# Patient Record
Sex: Male | Born: 1970 | Race: Black or African American | Hispanic: No | Marital: Married | Smoking: Former smoker
Health system: Southern US, Community
[De-identification: ages and names within clinical notes are randomized; demographics above are authoritative.]

## PROBLEM LIST (undated history)

## (undated) DIAGNOSIS — K219 Gastro-esophageal reflux disease without esophagitis: Secondary | ICD-10-CM

## (undated) DIAGNOSIS — E669 Obesity, unspecified: Secondary | ICD-10-CM

## (undated) DIAGNOSIS — N179 Acute kidney failure, unspecified: Secondary | ICD-10-CM

## (undated) DIAGNOSIS — I10 Essential (primary) hypertension: Secondary | ICD-10-CM

## (undated) DIAGNOSIS — E785 Hyperlipidemia, unspecified: Secondary | ICD-10-CM

## (undated) DIAGNOSIS — G43909 Migraine, unspecified, not intractable, without status migrainosus: Secondary | ICD-10-CM

## (undated) DIAGNOSIS — K2981 Duodenitis with bleeding: Secondary | ICD-10-CM

## (undated) DIAGNOSIS — M549 Dorsalgia, unspecified: Secondary | ICD-10-CM

## (undated) DIAGNOSIS — G8929 Other chronic pain: Secondary | ICD-10-CM

## (undated) DIAGNOSIS — R51 Headache: Secondary | ICD-10-CM

## (undated) DIAGNOSIS — G4730 Sleep apnea, unspecified: Secondary | ICD-10-CM

## (undated) HISTORY — DX: Headache: R51

## (undated) HISTORY — DX: Obesity, unspecified: E66.9

## (undated) HISTORY — PX: FOOT FRACTURE SURGERY: SHX645

## (undated) HISTORY — DX: Hyperlipidemia, unspecified: E78.5

## (undated) HISTORY — DX: Other chronic pain: G89.29

## (undated) HISTORY — DX: Duodenitis with bleeding: K29.81

## (undated) HISTORY — DX: Gastro-esophageal reflux disease without esophagitis: K21.9

## (undated) HISTORY — DX: Dorsalgia, unspecified: M54.9

## (undated) HISTORY — DX: Acute kidney failure, unspecified: N17.9

## (undated) HISTORY — DX: Essential (primary) hypertension: I10

---

## 1998-05-06 ENCOUNTER — Encounter: Admission: RE | Admit: 1998-05-06 | Discharge: 1998-05-06 | Payer: Self-pay | Admitting: *Deleted

## 2001-01-06 ENCOUNTER — Encounter: Payer: Self-pay | Admitting: Emergency Medicine

## 2001-01-06 ENCOUNTER — Emergency Department (HOSPITAL_COMMUNITY): Admission: EM | Admit: 2001-01-06 | Discharge: 2001-01-06 | Payer: Self-pay | Admitting: Emergency Medicine

## 2001-03-23 ENCOUNTER — Encounter: Payer: Self-pay | Admitting: Emergency Medicine

## 2001-03-23 ENCOUNTER — Emergency Department (HOSPITAL_COMMUNITY): Admission: EM | Admit: 2001-03-23 | Discharge: 2001-03-23 | Payer: Self-pay | Admitting: Emergency Medicine

## 2001-11-22 ENCOUNTER — Emergency Department (HOSPITAL_COMMUNITY): Admission: EM | Admit: 2001-11-22 | Discharge: 2001-11-22 | Payer: Self-pay | Admitting: *Deleted

## 2001-11-22 ENCOUNTER — Encounter: Payer: Self-pay | Admitting: *Deleted

## 2001-12-07 ENCOUNTER — Emergency Department (HOSPITAL_COMMUNITY): Admission: EM | Admit: 2001-12-07 | Discharge: 2001-12-07 | Payer: Self-pay | Admitting: *Deleted

## 2002-06-15 ENCOUNTER — Emergency Department (HOSPITAL_COMMUNITY): Admission: EM | Admit: 2002-06-15 | Discharge: 2002-06-15 | Payer: Self-pay | Admitting: Emergency Medicine

## 2002-09-19 ENCOUNTER — Emergency Department (HOSPITAL_COMMUNITY): Admission: EM | Admit: 2002-09-19 | Discharge: 2002-09-20 | Payer: Self-pay | Admitting: Emergency Medicine

## 2002-09-19 ENCOUNTER — Encounter: Payer: Self-pay | Admitting: Emergency Medicine

## 2002-12-25 ENCOUNTER — Emergency Department (HOSPITAL_COMMUNITY): Admission: EM | Admit: 2002-12-25 | Discharge: 2002-12-25 | Payer: Self-pay | Admitting: Emergency Medicine

## 2003-01-14 ENCOUNTER — Emergency Department (HOSPITAL_COMMUNITY): Admission: EM | Admit: 2003-01-14 | Discharge: 2003-01-14 | Payer: Self-pay | Admitting: Emergency Medicine

## 2003-04-06 ENCOUNTER — Emergency Department (HOSPITAL_COMMUNITY): Admission: EM | Admit: 2003-04-06 | Discharge: 2003-04-06 | Payer: Self-pay | Admitting: Emergency Medicine

## 2004-02-28 ENCOUNTER — Emergency Department (HOSPITAL_COMMUNITY): Admission: EM | Admit: 2004-02-28 | Discharge: 2004-02-29 | Payer: Self-pay | Admitting: Emergency Medicine

## 2004-03-04 ENCOUNTER — Inpatient Hospital Stay (HOSPITAL_COMMUNITY): Admission: EM | Admit: 2004-03-04 | Discharge: 2004-03-10 | Payer: Self-pay | Admitting: Emergency Medicine

## 2004-03-25 ENCOUNTER — Ambulatory Visit (HOSPITAL_COMMUNITY): Admission: RE | Admit: 2004-03-25 | Discharge: 2004-03-25 | Payer: Self-pay | Admitting: Family Medicine

## 2004-08-30 ENCOUNTER — Emergency Department (HOSPITAL_COMMUNITY): Admission: EM | Admit: 2004-08-30 | Discharge: 2004-08-30 | Payer: Self-pay | Admitting: Emergency Medicine

## 2004-11-11 ENCOUNTER — Inpatient Hospital Stay (HOSPITAL_COMMUNITY): Admission: RE | Admit: 2004-11-11 | Discharge: 2004-11-12 | Payer: Self-pay | Admitting: Orthopedic Surgery

## 2005-01-25 ENCOUNTER — Ambulatory Visit: Payer: Self-pay | Admitting: Family Medicine

## 2005-06-02 ENCOUNTER — Ambulatory Visit: Payer: Self-pay | Admitting: Family Medicine

## 2005-06-20 ENCOUNTER — Ambulatory Visit: Payer: Self-pay | Admitting: Family Medicine

## 2005-07-25 ENCOUNTER — Ambulatory Visit: Payer: Self-pay | Admitting: Family Medicine

## 2005-08-19 ENCOUNTER — Ambulatory Visit: Payer: Self-pay | Admitting: Family Medicine

## 2005-11-07 ENCOUNTER — Ambulatory Visit: Payer: Self-pay | Admitting: Family Medicine

## 2005-12-29 ENCOUNTER — Ambulatory Visit: Payer: Self-pay | Admitting: Family Medicine

## 2006-02-15 ENCOUNTER — Ambulatory Visit: Payer: Self-pay | Admitting: Family Medicine

## 2006-03-06 ENCOUNTER — Ambulatory Visit: Payer: Self-pay | Admitting: Internal Medicine

## 2006-06-14 ENCOUNTER — Ambulatory Visit: Payer: Self-pay | Admitting: Family Medicine

## 2006-09-19 ENCOUNTER — Emergency Department (HOSPITAL_COMMUNITY): Admission: EM | Admit: 2006-09-19 | Discharge: 2006-09-19 | Payer: Self-pay | Admitting: Emergency Medicine

## 2006-12-29 ENCOUNTER — Emergency Department (HOSPITAL_COMMUNITY): Admission: EM | Admit: 2006-12-29 | Discharge: 2006-12-29 | Payer: Self-pay | Admitting: Emergency Medicine

## 2007-01-09 ENCOUNTER — Ambulatory Visit: Payer: Self-pay | Admitting: Family Medicine

## 2007-03-28 ENCOUNTER — Emergency Department (HOSPITAL_COMMUNITY): Admission: EM | Admit: 2007-03-28 | Discharge: 2007-03-28 | Payer: Self-pay | Admitting: Emergency Medicine

## 2007-05-13 ENCOUNTER — Emergency Department (HOSPITAL_COMMUNITY): Admission: EM | Admit: 2007-05-13 | Discharge: 2007-05-13 | Payer: Self-pay | Admitting: Emergency Medicine

## 2007-05-26 ENCOUNTER — Emergency Department (HOSPITAL_COMMUNITY): Admission: EM | Admit: 2007-05-26 | Discharge: 2007-05-26 | Payer: Self-pay | Admitting: Emergency Medicine

## 2007-06-03 ENCOUNTER — Emergency Department (HOSPITAL_COMMUNITY): Admission: EM | Admit: 2007-06-03 | Discharge: 2007-06-03 | Payer: Self-pay | Admitting: Emergency Medicine

## 2007-06-12 ENCOUNTER — Ambulatory Visit: Payer: Self-pay | Admitting: Family Medicine

## 2007-07-10 ENCOUNTER — Emergency Department (HOSPITAL_COMMUNITY): Admission: EM | Admit: 2007-07-10 | Discharge: 2007-07-10 | Payer: Self-pay | Admitting: Emergency Medicine

## 2007-07-13 ENCOUNTER — Emergency Department (HOSPITAL_COMMUNITY): Admission: EM | Admit: 2007-07-13 | Discharge: 2007-07-14 | Payer: Self-pay | Admitting: Emergency Medicine

## 2007-07-17 ENCOUNTER — Ambulatory Visit: Payer: Self-pay | Admitting: Family Medicine

## 2007-07-28 ENCOUNTER — Emergency Department (HOSPITAL_COMMUNITY): Admission: EM | Admit: 2007-07-28 | Discharge: 2007-07-28 | Payer: Self-pay | Admitting: Emergency Medicine

## 2007-08-07 ENCOUNTER — Emergency Department (HOSPITAL_COMMUNITY): Admission: EM | Admit: 2007-08-07 | Discharge: 2007-08-07 | Payer: Self-pay | Admitting: Emergency Medicine

## 2007-08-28 ENCOUNTER — Ambulatory Visit: Payer: Self-pay | Admitting: Family Medicine

## 2007-09-09 ENCOUNTER — Emergency Department (HOSPITAL_COMMUNITY): Admission: EM | Admit: 2007-09-09 | Discharge: 2007-09-09 | Payer: Self-pay | Admitting: Emergency Medicine

## 2007-10-06 ENCOUNTER — Emergency Department (HOSPITAL_COMMUNITY): Admission: EM | Admit: 2007-10-06 | Discharge: 2007-10-06 | Payer: Self-pay | Admitting: Emergency Medicine

## 2007-10-09 ENCOUNTER — Encounter: Payer: Self-pay | Admitting: Family Medicine

## 2007-10-09 DIAGNOSIS — I10 Essential (primary) hypertension: Secondary | ICD-10-CM | POA: Insufficient documentation

## 2007-10-09 DIAGNOSIS — G43909 Migraine, unspecified, not intractable, without status migrainosus: Secondary | ICD-10-CM | POA: Insufficient documentation

## 2007-10-09 HISTORY — DX: Essential (primary) hypertension: I10

## 2007-11-07 ENCOUNTER — Ambulatory Visit: Payer: Self-pay | Admitting: Family Medicine

## 2007-11-26 ENCOUNTER — Emergency Department (HOSPITAL_COMMUNITY): Admission: EM | Admit: 2007-11-26 | Discharge: 2007-11-26 | Payer: Self-pay | Admitting: Emergency Medicine

## 2007-12-23 ENCOUNTER — Emergency Department (HOSPITAL_COMMUNITY): Admission: EM | Admit: 2007-12-23 | Discharge: 2007-12-23 | Payer: Self-pay | Admitting: Emergency Medicine

## 2008-02-08 ENCOUNTER — Emergency Department (HOSPITAL_COMMUNITY): Admission: EM | Admit: 2008-02-08 | Discharge: 2008-02-08 | Payer: Self-pay | Admitting: Emergency Medicine

## 2008-03-10 ENCOUNTER — Emergency Department (HOSPITAL_COMMUNITY): Admission: EM | Admit: 2008-03-10 | Discharge: 2008-03-10 | Payer: Self-pay | Admitting: Emergency Medicine

## 2008-03-24 ENCOUNTER — Emergency Department (HOSPITAL_COMMUNITY): Admission: EM | Admit: 2008-03-24 | Discharge: 2008-03-24 | Payer: Self-pay | Admitting: Emergency Medicine

## 2008-04-10 ENCOUNTER — Ambulatory Visit: Payer: Self-pay | Admitting: Family Medicine

## 2008-04-10 LAB — CONVERTED CEMR LAB: Helicobacter Pylori Antibody-IgG: 1.4 — ABNORMAL HIGH

## 2008-04-15 ENCOUNTER — Ambulatory Visit (HOSPITAL_COMMUNITY): Admission: RE | Admit: 2008-04-15 | Discharge: 2008-04-15 | Payer: Self-pay | Admitting: Family Medicine

## 2008-04-15 DIAGNOSIS — R1013 Epigastric pain: Secondary | ICD-10-CM | POA: Insufficient documentation

## 2008-04-15 DIAGNOSIS — M549 Dorsalgia, unspecified: Secondary | ICD-10-CM | POA: Insufficient documentation

## 2008-05-28 ENCOUNTER — Telehealth: Payer: Self-pay | Admitting: Family Medicine

## 2008-05-30 ENCOUNTER — Telehealth: Payer: Self-pay | Admitting: Family Medicine

## 2008-06-02 DIAGNOSIS — R109 Unspecified abdominal pain: Secondary | ICD-10-CM | POA: Insufficient documentation

## 2008-06-03 ENCOUNTER — Ambulatory Visit (HOSPITAL_COMMUNITY): Admission: RE | Admit: 2008-06-03 | Discharge: 2008-06-03 | Payer: Self-pay | Admitting: Family Medicine

## 2008-06-10 ENCOUNTER — Encounter: Payer: Self-pay | Admitting: Family Medicine

## 2008-06-11 ENCOUNTER — Ambulatory Visit: Payer: Self-pay | Admitting: Internal Medicine

## 2008-06-20 ENCOUNTER — Ambulatory Visit: Payer: Self-pay | Admitting: Internal Medicine

## 2008-06-20 ENCOUNTER — Encounter: Payer: Self-pay | Admitting: Family Medicine

## 2008-06-20 ENCOUNTER — Encounter: Payer: Self-pay | Admitting: Internal Medicine

## 2008-06-20 ENCOUNTER — Ambulatory Visit (HOSPITAL_COMMUNITY): Admission: RE | Admit: 2008-06-20 | Discharge: 2008-06-20 | Payer: Self-pay | Admitting: Internal Medicine

## 2008-07-03 ENCOUNTER — Encounter: Payer: Self-pay | Admitting: Family Medicine

## 2008-07-18 ENCOUNTER — Encounter: Payer: Self-pay | Admitting: Family Medicine

## 2008-08-19 DIAGNOSIS — K2981 Duodenitis with bleeding: Secondary | ICD-10-CM

## 2008-08-19 HISTORY — DX: Duodenitis with bleeding: K29.81

## 2008-08-20 ENCOUNTER — Encounter: Payer: Self-pay | Admitting: Family Medicine

## 2008-08-31 ENCOUNTER — Emergency Department (HOSPITAL_COMMUNITY): Admission: EM | Admit: 2008-08-31 | Discharge: 2008-08-31 | Payer: Self-pay | Admitting: Emergency Medicine

## 2008-09-01 ENCOUNTER — Encounter: Payer: Self-pay | Admitting: Gastroenterology

## 2008-09-01 ENCOUNTER — Ambulatory Visit: Payer: Self-pay | Admitting: Internal Medicine

## 2008-09-01 LAB — CONVERTED CEMR LAB
Basophils Absolute: 0 10*3/uL (ref 0.0–0.1)
Basophils Relative: 0 % (ref 0–1)
MCHC: 33.8 g/dL (ref 30.0–36.0)
Neutro Abs: 3.2 10*3/uL (ref 1.7–7.7)
Neutrophils Relative %: 62 % (ref 43–77)
RBC: 3.13 M/uL — ABNORMAL LOW (ref 4.22–5.81)
RDW: 12.8 % (ref 11.5–15.5)

## 2008-09-04 ENCOUNTER — Encounter: Payer: Self-pay | Admitting: Family Medicine

## 2008-09-18 ENCOUNTER — Encounter: Payer: Self-pay | Admitting: Family Medicine

## 2008-10-20 ENCOUNTER — Ambulatory Visit: Payer: Self-pay | Admitting: Family Medicine

## 2008-10-20 DIAGNOSIS — R5383 Other fatigue: Secondary | ICD-10-CM

## 2008-10-20 DIAGNOSIS — G479 Sleep disorder, unspecified: Secondary | ICD-10-CM | POA: Insufficient documentation

## 2008-10-20 DIAGNOSIS — F17200 Nicotine dependence, unspecified, uncomplicated: Secondary | ICD-10-CM | POA: Insufficient documentation

## 2008-10-20 DIAGNOSIS — K266 Chronic or unspecified duodenal ulcer with both hemorrhage and perforation: Secondary | ICD-10-CM | POA: Insufficient documentation

## 2008-11-10 ENCOUNTER — Telehealth: Payer: Self-pay | Admitting: Gastroenterology

## 2008-11-11 ENCOUNTER — Ambulatory Visit: Payer: Self-pay | Admitting: Internal Medicine

## 2008-11-22 ENCOUNTER — Emergency Department (HOSPITAL_COMMUNITY): Admission: EM | Admit: 2008-11-22 | Discharge: 2008-11-22 | Payer: Self-pay | Admitting: Emergency Medicine

## 2008-12-25 ENCOUNTER — Telehealth: Payer: Self-pay | Admitting: Family Medicine

## 2008-12-25 ENCOUNTER — Encounter: Payer: Self-pay | Admitting: Family Medicine

## 2008-12-26 ENCOUNTER — Encounter: Payer: Self-pay | Admitting: Family Medicine

## 2009-01-05 ENCOUNTER — Emergency Department (HOSPITAL_COMMUNITY): Admission: EM | Admit: 2009-01-05 | Discharge: 2009-01-05 | Payer: Self-pay | Admitting: Emergency Medicine

## 2009-01-07 ENCOUNTER — Ambulatory Visit: Payer: Self-pay | Admitting: Family Medicine

## 2009-01-07 DIAGNOSIS — K279 Peptic ulcer, site unspecified, unspecified as acute or chronic, without hemorrhage or perforation: Secondary | ICD-10-CM | POA: Insufficient documentation

## 2009-01-11 DIAGNOSIS — R21 Rash and other nonspecific skin eruption: Secondary | ICD-10-CM | POA: Insufficient documentation

## 2009-01-26 ENCOUNTER — Telehealth: Payer: Self-pay | Admitting: Family Medicine

## 2009-01-26 ENCOUNTER — Emergency Department (HOSPITAL_COMMUNITY): Admission: EM | Admit: 2009-01-26 | Discharge: 2009-01-26 | Payer: Self-pay | Admitting: Emergency Medicine

## 2009-01-28 ENCOUNTER — Emergency Department (HOSPITAL_COMMUNITY): Admission: EM | Admit: 2009-01-28 | Discharge: 2009-01-28 | Payer: Self-pay | Admitting: Emergency Medicine

## 2009-02-10 ENCOUNTER — Encounter: Payer: Self-pay | Admitting: Gastroenterology

## 2009-02-11 ENCOUNTER — Ambulatory Visit: Payer: Self-pay | Admitting: Family Medicine

## 2009-02-11 ENCOUNTER — Telehealth: Payer: Self-pay | Admitting: Family Medicine

## 2009-02-17 ENCOUNTER — Encounter: Payer: Self-pay | Admitting: Family Medicine

## 2009-02-19 ENCOUNTER — Encounter: Payer: Self-pay | Admitting: Internal Medicine

## 2009-03-05 ENCOUNTER — Encounter: Payer: Self-pay | Admitting: Family Medicine

## 2009-03-13 ENCOUNTER — Encounter: Payer: Self-pay | Admitting: Family Medicine

## 2009-03-13 ENCOUNTER — Telehealth: Payer: Self-pay | Admitting: Family Medicine

## 2009-04-13 ENCOUNTER — Telehealth: Payer: Self-pay | Admitting: Family Medicine

## 2009-06-09 ENCOUNTER — Emergency Department (HOSPITAL_COMMUNITY): Admission: EM | Admit: 2009-06-09 | Discharge: 2009-06-09 | Payer: Self-pay | Admitting: Emergency Medicine

## 2009-06-10 ENCOUNTER — Telehealth: Payer: Self-pay | Admitting: Family Medicine

## 2009-06-15 ENCOUNTER — Ambulatory Visit: Payer: Self-pay | Admitting: Family Medicine

## 2009-07-09 ENCOUNTER — Telehealth: Payer: Self-pay | Admitting: Family Medicine

## 2009-08-12 ENCOUNTER — Telehealth: Payer: Self-pay | Admitting: Family Medicine

## 2009-08-26 ENCOUNTER — Emergency Department (HOSPITAL_COMMUNITY): Admission: EM | Admit: 2009-08-26 | Discharge: 2009-08-26 | Payer: Self-pay | Admitting: Emergency Medicine

## 2009-08-31 ENCOUNTER — Encounter: Payer: Self-pay | Admitting: Family Medicine

## 2009-09-02 ENCOUNTER — Ambulatory Visit: Payer: Self-pay | Admitting: Family Medicine

## 2009-09-04 ENCOUNTER — Emergency Department (HOSPITAL_COMMUNITY): Admission: EM | Admit: 2009-09-04 | Discharge: 2009-09-04 | Payer: Self-pay | Admitting: Emergency Medicine

## 2009-09-10 ENCOUNTER — Encounter: Payer: Self-pay | Admitting: Family Medicine

## 2009-09-23 ENCOUNTER — Encounter: Payer: Self-pay | Admitting: Family Medicine

## 2009-09-30 ENCOUNTER — Encounter: Payer: Self-pay | Admitting: Family Medicine

## 2009-10-12 ENCOUNTER — Telehealth: Payer: Self-pay | Admitting: Family Medicine

## 2009-10-13 ENCOUNTER — Telehealth: Payer: Self-pay | Admitting: Family Medicine

## 2010-01-12 ENCOUNTER — Ambulatory Visit: Payer: Self-pay | Admitting: Family Medicine

## 2010-01-12 ENCOUNTER — Telehealth: Payer: Self-pay | Admitting: Family Medicine

## 2010-02-18 ENCOUNTER — Telehealth: Payer: Self-pay | Admitting: Family Medicine

## 2010-02-19 ENCOUNTER — Encounter: Payer: Self-pay | Admitting: Family Medicine

## 2010-06-28 ENCOUNTER — Telehealth: Payer: Self-pay | Admitting: Family Medicine

## 2010-07-26 ENCOUNTER — Emergency Department (HOSPITAL_COMMUNITY): Admission: EM | Admit: 2010-07-26 | Discharge: 2010-07-26 | Payer: Self-pay | Admitting: Emergency Medicine

## 2010-07-27 ENCOUNTER — Emergency Department (HOSPITAL_COMMUNITY): Admission: EM | Admit: 2010-07-27 | Discharge: 2010-07-27 | Payer: Self-pay | Admitting: Emergency Medicine

## 2010-07-27 ENCOUNTER — Telehealth: Payer: Self-pay | Admitting: Family Medicine

## 2010-08-03 ENCOUNTER — Ambulatory Visit: Payer: Self-pay | Admitting: Family Medicine

## 2010-08-04 LAB — CONVERTED CEMR LAB
ALT: 10 units/L (ref 0–53)
AST: 14 units/L (ref 0–37)
Albumin: 4.6 g/dL (ref 3.5–5.2)
Alkaline Phosphatase: 72 units/L (ref 39–117)
Basophils Absolute: 0 10*3/uL (ref 0.0–0.1)
Basophils Relative: 0 % (ref 0–1)
Bilirubin, Direct: 0.1 mg/dL (ref 0.0–0.3)
CO2: 25 meq/L (ref 19–32)
Calcium: 8.9 mg/dL (ref 8.4–10.5)
Cholesterol: 105 mg/dL (ref 0–200)
Creatinine, Ser: 1.87 mg/dL — ABNORMAL HIGH (ref 0.40–1.50)
Eosinophils Absolute: 0.1 10*3/uL (ref 0.0–0.7)
Eosinophils Relative: 2 % (ref 0–5)
HDL: 41 mg/dL (ref >39)
Lymphocytes Relative: 28 % (ref 12–46)
Lymphs Abs: 1.4 10*3/uL (ref 0.7–4.0)
Monocytes Absolute: 0.3 10*3/uL (ref 0.1–1.0)
Monocytes Relative: 7 % (ref 3–12)
Neutro Abs: 3 10*3/uL (ref 1.7–7.7)
Neutrophils Relative %: 62 % (ref 43–77)
RBC: 4.03 M/uL — ABNORMAL LOW (ref 4.22–5.81)
Sodium: 142 meq/L (ref 135–145)
TSH: 0.587 microintl units/mL (ref 0.350–4.500)
Total Bilirubin: 0.5 mg/dL (ref 0.3–1.2)
Total CHOL/HDL Ratio: 2.6
WBC: 4.8 10*3/uL (ref 4.0–10.5)

## 2010-08-06 ENCOUNTER — Telehealth: Payer: Self-pay | Admitting: Family Medicine

## 2010-08-09 ENCOUNTER — Ambulatory Visit (HOSPITAL_COMMUNITY): Admission: RE | Admit: 2010-08-09 | Discharge: 2010-08-09 | Payer: Self-pay | Admitting: Family Medicine

## 2010-08-17 ENCOUNTER — Telehealth: Payer: Self-pay | Admitting: Family Medicine

## 2010-08-17 ENCOUNTER — Encounter: Payer: Self-pay | Admitting: Family Medicine

## 2010-10-10 ENCOUNTER — Encounter: Payer: Self-pay | Admitting: Family Medicine

## 2010-10-21 ENCOUNTER — Emergency Department (HOSPITAL_COMMUNITY)
Admission: EM | Admit: 2010-10-21 | Discharge: 2010-10-21 | Disposition: A | Discharge status: Home / Self Care | Payer: Managed Care, Other (non HMO) | Attending: Emergency Medicine | Admitting: Emergency Medicine

## 2010-10-21 ENCOUNTER — Emergency Department (HOSPITAL_COMMUNITY)
Admit: 2010-10-21 | Discharge: 2010-10-21 | Disposition: A | Discharge status: Home / Self Care | Payer: Managed Care, Other (non HMO)

## 2010-10-21 DIAGNOSIS — G43909 Migraine, unspecified, not intractable, without status migrainosus: Secondary | ICD-10-CM | POA: Insufficient documentation

## 2010-10-21 DIAGNOSIS — R112 Nausea with vomiting, unspecified: Secondary | ICD-10-CM | POA: Insufficient documentation

## 2010-10-21 NOTE — Progress Notes (Signed)
Summary: medication  Phone Note Call from Patient   Summary of Call: patient needs his azor and vicodin called into [facility]. Initial call taken by: Eliezer Mccoy,  August 06, 2010 9:18 AM  Follow-up for Phone Call        i refilled azor, do you want to refill vicodin? Follow-up by: Baldomero Lamy LPN,  November 18, 624THL 1:17 PM  Additional Follow-up for Phone Call Additional follow up Details #1::        yes pls do x 2 only Additional Follow-up by: Tula Nakayama MD,  August 06, 2010 2:26 PM    New/Updated Medications: VICODIN HP 10-660 MG TABS (HYDROCODONE-ACETAMINOPHEN) Take 1 tablet by mouth two times a day as needed for severe hadache, max 8 tabs per week Prescriptions: VICODIN HP 10-660 MG TABS (HYDROCODONE-ACETAMINOPHEN) Take 1 tablet by mouth two times a day as needed for severe hadache, max 8 tabs per week  #30 x 1   Entered by:   Baldomero Lamy LPN   Authorized by:   Tula Nakayama MD   Signed by:   Baldomero Lamy LPN on 624THL   Method used:   Printed then faxed to ...       [facility] (retail)       [address], [place]  [address]       Ph: [phone]       Fax: [phone]   RxIDUO:[id number] AZOR 10-40 MG TABS (AMLODIPINE-OLMESARTAN) one tab by mouth once daily  #30 Each x 3   Entered by:   Baldomero Lamy LPN   Authorized by:   Tula Nakayama MD   Signed by:   Baldomero Lamy LPN on 624THL   Method used:   Electronically to        [facility]. [phone]* (retail)       [address] [place],   [address]       Ph: [phone]       Fax: [phone]   RxID:   [id number]

## 2010-10-21 NOTE — Progress Notes (Signed)
Summary: CALL BACK  Phone Note Call from Patient   Summary of Call: [name] TO CALL  HIM AT [id number] Initial call taken by: Dierdre Harness,  October 12, 2009 1:01 PM  Follow-up for Phone Call        returned call, no answer Follow-up by: Jimmey Ralph LPN,  January 24, 624THL 1:45 PM  Additional Follow-up for Phone Call Additional follow up Details #1::        do you want this patinet on hydrocodone all the time? i regular fills but no refills. should we be refilling this med? Additional Follow-up by: Jimmey Ralph LPN,  January 24, 624THL 2:12 PM    Additional Follow-up for Phone Call Additional follow up Details #2::    ok to refill he has recurrent headaches which respond to this Follow-up by: Tula Nakayama MD,  October 12, 2009 5:58 PM  Additional Follow-up for Phone Call Additional follow up Details #3:: Details for Additional Follow-up Action Taken: rx sent, patient aware Additional Follow-up by: Jimmey Ralph LPN,  January 25, 624THL 8:43 AM  Prescriptions: VICODIN HP 10-660 MG TABS (HYDROCODONE-ACETAMINOPHEN) Take 1 tablet by mouth two times a day as needed for severe hadache, max 8 tabs per week  #30 x 2   Entered by:   Jimmey Ralph LPN   Authorized by:   Tula Nakayama MD   Signed by:   Jimmey Ralph LPN on [id number]   Method used:   Printed then faxed to ...       [facility] (retail)       [address] [place]  [address]       Ph: [phone]       Fax: [phone]   RxID:   [id number]

## 2010-10-21 NOTE — Medication Information (Signed)
Summary: Visual merchandiser   Imported By: Dierdre Harness 02/19/2010 16:12:07  _____________________________________________________________________  External Attachment:    Type:   Image     Comment:   External Document

## 2010-10-21 NOTE — Assessment & Plan Note (Signed)
Summary: meds   Vital Signs:  Patient profile:   40 year old male Height:      72 inches Weight:      209 pounds BMI:     28.45 O2 Sat:      97 % Pulse rate:   89 / minute Pulse rhythm:   regular Resp:     16 per minute BP sitting:   120 / 80  (left arm) Cuff size:   large  Vitals Entered By: Kate Sable LPN (April 26, 624THL 624THL PM)  Nutrition Counseling: Patient's BMI is greater than 25 and therefore counseled on weight management options. CC: has been having headaches, has to wear his shades alot. Has been under alot of stress. Having them in the morning, they start out severe and then get less throughout the day but it doesn't go away completely   Primary Care Provider:  Tula Nakayama MD  CC:  has been having headaches, has to wear his shades alot. Has been under alot of stress. Having them in the morning, and they start out severe and then get less throughout the day but it doesn't go away completely.  History of Present Illness: Reports  that he has not been doing well. Denies recent fever or chills. Denies sinus pressure, nasal congestion , ear pain or sore throat. Denies chest congestion, or cough productive of sputum. Denies chest pain, palpitations, PND, orthopnea or leg swelling. Denies abdominal pain, nausea, vomitting, diarrhea or constipation. Denies change in bowel movements or bloody stool. Denies dysuria , frequency, incontinence or hesitancy. Denies  joint pain, swelling, or reduced mobility. reports increeased  headaches,denies  vertigoor  seizures. Reports both  depression and  anxiety with mild  insomnia. Denies  rash, lesions, or itch.     Current Medications (verified): 1)  Azor 10-40 Mg Tabs (Amlodipine-Olmesartan) .... One Tab By Mouth Once Daily 2)  Nexium 40 Mg Cpdr (Esomeprazole Magnesium) .... One Tab By Mouth Bid 3)  Vicodin Hp 10-660 Mg Tabs (Hydrocodone-Acetaminophen) .... Take 1 Tablet By Mouth Two Times A Day As Needed For Severe  Hadache, Max 8 Tabs Per Week 4)  Maxzide-25 37.5-25 Mg Tabs (Triamterene-Hctz) .... One and A Half Tablets Daily  Allergies (verified): 1)  ! Flexeril 2)  ! Compazine 3)  ! Hydrocodone 4)  ! * Carbamazepine  Review of Systems      See HPI General:  Complains of fatigue and malaise. Eyes:  Denies blurring, discharge, double vision, eye pain, and red eye. GI:  Complains of abdominal pain; denies constipation, dark tarry stools, diarrhea, nausea, and vomiting; intermittent. GU:  Denies dysuria and urinary frequency. MS:  Denies joint pain and stiffness. Neuro:  Complains of headaches; uncontrolled headache  since the past weekened  he has been having nausea.iNCREASED FAMILY STRESS, HIS GRANDMOTHER BY MARRIAGE RECENTLY PASSED. Psych:  Complains of anxiety and depression; denies irritability, mental problems, panic attacks, suicidal thoughts/plans, thoughts of violence, and unusual visions or sounds; mild depression and anxiety, he remains unemployed but is hoping that this will soon change. Endo:  Denies cold intolerance, excessive hunger, excessive thirst, excessive urination, heat intolerance, polyuria, and weight change. Heme:  Denies abnormal bruising and bleeding. Allergy:  Denies hives or rash and itching eyes.  Physical Exam  General:  Well-developed,well-nourished,in no acute distress; alert,appropriate and cooperative throughout examination HEENT: No facial asymmetry,  EOMI, No sinus tenderness, TM's Clear, oropharynx  pink and moist.   Chest: Clear to auscultation bilaterally.  CVS: S1, S2, No  murmurs, No S3.   Abd: Soft, Nontender.  MS: dadequate ROM spine, hips , shoulders and knees Ext: No edema.   CNS: CN 2-12 intact, power tone and sensation normal throughout.   Skin: Intact, no visible lesions or rashes.  Psych: Good eye contact, normal affect.  Memory intact, not anxious or depressed appearing.     Impression & Recommendations:  Problem # 1:  BACK PAIN, CHRONIC  (ICD-724.5) Assessment Unchanged  The following medications were removed from the medication list:    Cyclobenzaprine Hcl 10 Mg Tabs (Cyclobenzaprine hcl) .Marland Kitchen... Take 1 tablet by mouth three times a day as needed His updated medication list for this problem includes:    Vicodin Hp 10-660 Mg Tabs (Hydrocodone-acetaminophen) .Marland Kitchen... Take 1 tablet by mouth two times a day as needed for severe hadache, max 8 tabs per week  Problem # 2:  HYPERTENSION (ICD-401.9) Assessment: Improved  His updated medication list for this problem includes:    Azor 10-40 Mg Tabs (Amlodipine-olmesartan) ..... One tab by mouth once daily    Maxzide-25 37.5-25 Mg Tabs (Triamterene-hctz) ..... One and a half tablets daily  BP today: 120/80 Prior BP: 160/110 (09/02/2009)  Problem # 3:  HEADACHE (ICD-784.0) Assessment: Deteriorated  His updated medication list for this problem includes:    Vicodin Hp 10-660 Mg Tabs (Hydrocodone-acetaminophen) .Marland Kitchen... Take 1 tablet by mouth two times a day as needed for severe hadache, max 8 tabs per week  Problem # 4:  NICOTINE ADDICTION (ICD-305.1) Assessment: Unchanged  Encouraged smoking cessation and discussed different methods for smoking cessation.   Complete Medication List: 1)  Azor 10-40 Mg Tabs (Amlodipine-olmesartan) .... One tab by mouth once daily 2)  Nexium 40 Mg Cpdr (Esomeprazole magnesium) .... One tab by mouth bid 3)  Vicodin Hp 10-660 Mg Tabs (Hydrocodone-acetaminophen) .... Take 1 tablet by mouth two times a day as needed for severe hadache, max 8 tabs per week 4)  Maxzide-25 37.5-25 Mg Tabs (Triamterene-hctz) .... One and a half tablets daily  Other Orders: T-Basic Metabolic Panel (99991111) T-Lipid Profile [phone]) T-CBC w/Diff [id number]) T-PSA [phone])  Patient Instructions: 1)  Please schedule a follow-up appointment in 4.5 months. 2)  Your blood pressure is great , congrats continue the meds pls. 3)  You will get a script for your  pain meds . 4)  BMP prior to visit, ICD-9: 5)  Lipid Panel prior to visit, ICD-9: 6)  CBC w/ Diff prior to visit, ICD-9:  fasting labs 7)  PSA prior to visit, ICD-9: Prescriptions: VICODIN HP 10-660 MG TABS (HYDROCODONE-ACETAMINOPHEN) Take 1 tablet by mouth two times a day as needed for severe hadache, max 8 tabs per week  #30 x 3   Entered and Authorized by:   Tula Nakayama MD   Signed by:   Tula Nakayama MD on 01/17/2010   Method used:   Handwritten   RxIDIY:[id number]

## 2010-10-21 NOTE — Progress Notes (Signed)
  Phone Note Call from Patient   Caller: Patient Summary of Call: patient called today wantin refill on hydrocodone. i have denied this request. request is two days early and this is a as needed med only, if patient is having severe headaches daily and taking all meds up in one month time patient needs a neuro referal or pain management. also patient has not been seen here in some time. patient was advised no fill without ov but i hope you agree about referals Initial call taken by: Baldomero Lamy LPN,  November  8, 624THL 10:05 AM  Follow-up for Phone Call        I agree with both of these recommendations,  Follow-up by: Tula Nakayama MD,  July 27, 2010 12:27 PM

## 2010-10-21 NOTE — Progress Notes (Signed)
Summary: medicine  Phone Note Call from Patient   Summary of Call: please send hydro send to [facility] and his azor to [facility] Initial call taken by: Dierdre Harness,  January 12, 2010 11:10 AM  Follow-up for Phone Call        advised no refills until comes in for ov Follow-up by: Baldomero Lamy LPN,  April 26, 624THL 11:13 AM

## 2010-10-21 NOTE — Progress Notes (Signed)
Summary: left message  Phone Note Call from Patient   Summary of Call: wants someone to call him back today asap Initial call taken by: Dierdre Harness,  October 13, 2009 8:21 AM  Follow-up for Phone Call        patient wanted to know if pain med had been faxed in yet advised yes Follow-up by: Jimmey Ralph LPN,  January 25, 624THL 8:44 AM

## 2010-10-21 NOTE — Miscellaneous (Signed)
Summary: refill  Clinical Lists Changes  Medications: Rx of AZOR 10-40 MG TABS (AMLODIPINE-OLMESARTAN) one tab by mouth once daily;  #30 Each x 4;  Signed;  Entered by: Jimmey Ralph LPN;  Authorized by: Tula Nakayama MD;  Method used: Electronically to [facility]. [phone]*, [address] [place], [place]  [address], Ph: [phone], Fax: [phone]    Prescriptions: AZOR 10-40 MG TABS (AMLODIPINE-OLMESARTAN) one tab by mouth once daily  #30 Each x 4   Entered by:   Jimmey Ralph LPN   Authorized by:   Tula Nakayama MD   Signed by:   Jimmey Ralph LPN on [id number]   Method used:   Electronically to        [facility]. [phone]* (retail)       [address] [place]  [address]       Ph: [phone]       Fax: [phone]   RxID:   [id number]

## 2010-10-21 NOTE — Progress Notes (Signed)
Summary: NO SHOW  Phone Note From Other Clinic   Summary of Call: he was a NO SHOW AT DR. Alessandra Grout Initial call taken by: Dierdre Harness,  August 17, 2010 1:56 PM  Follow-up for Phone Call        noted Follow-up by: Tula Nakayama MD,  August 17, 2010 4:50 PM

## 2010-10-21 NOTE — Letter (Signed)
Summary: Out of Work  [facility]  [address]   [place], [place] [address]   Phone: [phone]  Fax: [phone]    August 03, 2010   Employee:  CHEEMENG GAWNE    To Whom It May Concern:   For Medical reasons, please excuse the above named employee from work for the following dates:  Start:   09/25/09  End:   08/04/10 to return with no restrictions  If you need additional information, please feel free to contact our office.         Sincerely,    Norwood Levo. Moshe Cipro, MD

## 2010-10-21 NOTE — Progress Notes (Signed)
Summary: med   Phone Note Call from Patient   Summary of Call: would like to speak with nurse about some med. [id number]  Initial call taken by: Lenn Cal,  February 18, 2010 4:35 PM  Follow-up for Phone Call        it needs a pa. calling to get form faxed now. advised pt it could take up to 48 hrs. Follow-up by: Kate Sable LPN,  June  3, 624THL 075-GRM AM

## 2010-10-21 NOTE — Letter (Signed)
Summary: Letter  Letter   Imported By: Dierdre Harness 08/18/2010 13:09:54  _____________________________________________________________________  External Attachment:    Type:   Image     Comment:   External Document

## 2010-10-21 NOTE — Assessment & Plan Note (Signed)
Summary: NEEDS REFIL OF HYDROCODONE   Vital Signs:  Patient profile:   40 year old male Height:      72 inches Weight:      203 pounds BMI:     27.63 O2 Sat:      97 % on Room air Pulse rate:   112 / minute Pulse rhythm:   regular Resp:     16 per minute BP sitting:   140 / 90  (right arm)  Vitals Entered By: Louie Casa CMA (August 03, 2010 2:28 PM)  Nutrition Counseling: Patient's BMI is greater than 25 and therefore counseled on weight management options.  O2 Flow:  Room air CC: Headache. Sick all last week, nausea, vomiting, weak, no appetite and did not sleep good. Went to ER Monday and Tuesday Comments Did not bring meds   Primary Care Provider:  Tula Nakayama MD  CC:  Headache. Sick all last week, nausea, vomiting, weak, and no appetite and did not sleep good. Went to ER Monday and Tuesday.  History of Present Illness: Pt states he feels weak, not resting well for the pst week, he was seen twice in the Ed last week and was told he had a virus.States he is better, needs a note to return to work Getting over this but not yet back to normal.his symptoms were primarily gI , including nausea, weakness, vomitting, poor apetite and poor sleep as a result he has had increased and uncontrolled headache. He also states that his headaches are increasingly unbearable in terms of frequency and severity. He is now able to have a scan and neurology eval, and agrees to both  Current Medications (verified): 1)  Azor 10-40 Mg Tabs (Amlodipine-Olmesartan) .... One Tab By Mouth Once Daily 2)  Nexium 40 Mg Cpdr (Esomeprazole Magnesium) .... One Tab By Mouth Bid 3)  Vicodin Hp 10-660 Mg Tabs (Hydrocodone-Acetaminophen) .... Take 1 Tablet By Mouth Two Times A Day As Needed For Severe Hadache, Max 8 Tabs Per Week 4)  Maxzide-25 37.5-25 Mg Tabs (Triamterene-Hctz) .... One and A Half Tablets Daily  Allergies: 1)  ! Flexeril 2)  ! Compazine 3)  ! * Carbamazepine  Review of Systems   See HPI General:  Complains of fatigue, loss of appetite, malaise, sweats, and weakness; 1 week ago. Eyes:  Denies blurring and discharge. ENT:  Denies hoarseness, nasal congestion, and sinus pressure. CV:  Denies chest pain or discomfort, palpitations, and swelling of feet. Resp:  Complains of cough and shortness of breath; denies sputum productive; dry cough last week. GI:  Complains of vomiting; vomitted for 3 days last week. GU:  Denies discharge, dysuria, erectile dysfunction, incontinence, nocturia, and urinary frequency. MS:  Complains of low back pain. Derm:  Denies itching, lesion(s), and rash. Neuro:  Complains of headaches; denies seizures, sensation of room spinning, and tingling; forehead andbitemporal throbbing, nausea , photophobia, relieved by resti. Psych:  Denies anxiety and easily angered. Endo:  Denies cold intolerance, excessive thirst, heat intolerance, and polyuria. Heme:  Denies abnormal bruising, bleeding, enlarge lymph nodes, and pallor. Allergy:  Denies hives or rash, itching eyes, seasonal allergies, and sneezing.  Physical Exam  General:  Well-developed,well-nourished,in no acute distress; alert,appropriate and cooperative throughout examination HEENT: No facial asymmetry,  EOMI, No sinus tenderness, TM's Clear, oropharynx  pink and moist.   Chest: Clear to auscultation bilaterally.  CVS: S1, S2, No murmurs, No S3.   Abd: Soft, Nontender.  MS: decreased ROM spine,adequate in hips, shoulders and knees.  Ext: No edema.   CNS: CN 2-12 intact, power tone and sensation normal throughout.   Skin: Intact, no visible lesions or rashes.  Psych: Good eye contact, normal affect.  Memory intact, not anxious or depressed appearing.    Impression & Recommendations:  Problem # 1:  HEADACHE, SEVERE (ICD-784.0) Assessment Deteriorated  His updated medication list for this problem includes:    Vicodin Hp 10-660 Mg Tabs (Hydrocodone-acetaminophen) .Marland Kitchen... Take 1 tablet  by mouth two times a day as needed for severe hadache, max 8 tabs per week  Orders: Neurology Referral (Neuro) Radiology Referral (Radiology)  Problem # 2:  HYPERTENSION, BENIGN ESSENTIAL, UNCONTROLLED (ICD-401.1) Assessment: Deteriorated  His updated medication list for this problem includes:    Azor 10-40 Mg Tabs (Amlodipine-olmesartan) ..... One tab by mouth once daily    Maxzide-25 37.5-25 Mg Tabs (Triamterene-hctz) ..... One and a half tablets daily  BP today: 140/90 Prior BP: 120/80 (01/12/2010) Patient advised to follow low sodium diet rich in fruit and vegetables, and to commit to at least 30 minutes 5 days per week of regular exercise , to improve blood presure control.   Problem # 3:  BACK PAIN, CHRONIC (ICD-724.5) Assessment: Unchanged  His updated medication list for this problem includes:    Vicodin Hp 10-660 Mg Tabs (Hydrocodone-acetaminophen) .Marland Kitchen... Take 1 tablet by mouth two times a day as needed for severe hadache, max 8 tabs per week  Complete Medication List: 1)  Azor 10-40 Mg Tabs (Amlodipine-olmesartan) .... One tab by mouth once daily 2)  Nexium 40 Mg Cpdr (Esomeprazole magnesium) .... One tab by mouth bid 3)  Vicodin Hp 10-660 Mg Tabs (Hydrocodone-acetaminophen) .... Take 1 tablet by mouth two times a day as needed for severe hadache, max 8 tabs per week 4)  Maxzide-25 37.5-25 Mg Tabs (Triamterene-hctz) .... One and a half tablets daily 5)  Topiramate 25 Mg Cpsp (Topiramate) .... Take 1 capsule by mouth two times a day for 2 weeks, then 2 twice daily for 2 weeks , start 08/03/2010 6)  Topiramate 50 Mg Tabs (Topiramate) .... Take 1 tablet by mouth two times a day , to start middle of december  Other Orders: T-Basic Metabolic Panel (99991111) T-Hepatic Function [phone]) T-Lipid Profile [phone]) T-CBC w/Diff [phone]) T-TSH [phone]) T-PSA [phone])  Patient Instructions: 1)  Please schedule a follow-up appointment in 4 months. 2)   You absolutelly need labwork, this is past due 3)  BMP prior to visit, ICD-9: 4)  Hepatic Panel prior to visit, ICD-9: 5)  Lipid Panel prior to visit, ICD-9:   today 6)  TSH prior to visit, ICD-9: 7)  CBC w/ Diff prior to visit, ICD-9: 8)  PSA prior to visit, ICD-9: 9)  Pls start new med to keep headaches away. 10)  You are referd for a brain scan and to see a neurologist Prescriptions: TOPIRAMATE 50 MG TABS (TOPIRAMATE) Take 1 tablet by mouth two times a day , to start middle of December  #60 x 1   Entered and Authorized by:   Tula Nakayama MD   Signed by:   Tula Nakayama MD on 08/03/2010   Method used:   Electronically to        [facility]. [phone]* (retail)       [address], [place]  [address]       Ph: [phone]       Fax: [phone]   RxID:   [id number] TOPIRAMATE 25  MG CPSP (TOPIRAMATE) Take 1 capsule by mouth two times a day for 2 weeks, then 2 twice daily for 2 weeks , start 08/03/2010  #84 x 0   Entered and Authorized by:   Tula Nakayama MD   Signed by:   Tula Nakayama MD on 08/03/2010   Method used:   Electronically to        [facility]. [phone]* (retail)       [address], [place]  [address]       Ph: [phone]       Fax: [phone]   RxID:   [id number]    Orders Added: 1)  Est. Patient Level IV [id number] 2)  T-Basic Metabolic Panel 0000000 3)  T-Hepatic Function [[id number]] 4)  T-Lipid Profile [id number])  T-CBC w/Diff [id number] 6)  T-TSH [id number] 7)  T-PSA [id number] 8)  Neurology Referral [Neuro] 9)  Radiology Referral [Radiology]

## 2010-10-21 NOTE — Progress Notes (Signed)
  Phone Note Call from Patient   Summary of Call: Patient called and wants to know if he can have fill on Vicodin. Advised he needed OV and nothing was scheduled for him and offered to let him schedule and he said he couldn't right now because of his financial situation. Said to please fill for one month and he would come in as soon as he could. Told him I would have to ask because I couldn't fill it without approval.  Initial call taken by: Kate Sable LPN,  October 10, 624THL 11:49 AM  Follow-up for Phone Call        one refill onltyy, ex[plain no more without ov, ido undestand the finances however Follow-up by: Tula Nakayama MD,  June 28, 2010 12:09 PM  Additional Follow-up for Phone Call Additional follow up Details #1::        Patient aware Additional Follow-up by: Kate Sable LPN,  October 10, 624THL 1:34 PM    Prescriptions: VICODIN HP 10-660 MG TABS (HYDROCODONE-ACETAMINOPHEN) Take 1 tablet by mouth two times a day as needed for severe hadache, max 8 tabs per week  #30 x 0   Entered by:   Kate Sable LPN   Authorized by:   Tula Nakayama MD   Signed by:   Kate Sable LPN on [id number]   Method used:   Printed then faxed to ...       [facility] (retail)       [address], [place]  [address]       Ph: [phone]       Fax: [phone]   RxID:   [id number]

## 2010-10-22 NOTE — Progress Notes (Signed)
Summary: [name] AND [name] AND [name]   Imported By: Dierdre Harness 09/25/2009 09:54:52  _____________________________________________________________________  External Attachment:    Type:   Image     Comment:   External Document

## 2010-11-23 ENCOUNTER — Encounter: Payer: Self-pay | Admitting: Family Medicine

## 2010-12-02 ENCOUNTER — Ambulatory Visit: Payer: Self-pay | Admitting: Family Medicine

## 2010-12-08 ENCOUNTER — Encounter: Payer: Self-pay | Admitting: Family Medicine

## 2010-12-08 ENCOUNTER — Ambulatory Visit (INDEPENDENT_AMBULATORY_CARE_PROVIDER_SITE_OTHER): Payer: Self-pay | Admitting: Family Medicine

## 2010-12-08 VITALS — BP 120/82 | HR 72 | Resp 16 | Ht 71.0 in | Wt 206.0 lb

## 2010-12-08 DIAGNOSIS — R51 Headache: Secondary | ICD-10-CM

## 2010-12-08 DIAGNOSIS — I10 Essential (primary) hypertension: Secondary | ICD-10-CM

## 2010-12-08 DIAGNOSIS — K219 Gastro-esophageal reflux disease without esophagitis: Secondary | ICD-10-CM

## 2010-12-08 NOTE — Patient Instructions (Signed)
F/U in 4 months.   You need to stop smoking, please  cut back regularly and ensure you set a quit date.   Exercise regularly and eat healthily

## 2010-12-28 NOTE — Progress Notes (Signed)
  Subjective:    Patient ID: [name], male    DOB: Dec 14, 1970, 40 y.o.   MRN: [id number]  HPI The PT is here for follow up and re-evaluation of chronic medical conditions, medication management and review of recent lab and radiology data.    Questions or concerns regarding consultations or procedures which the PT has had in the interim are  addressed. The PT denies any adverse reactions to current medications since the last visit.  He recently experienced tinnitus , one week ago, which has resolved. He continues to suffer from headaches intermittently, no specific aggravating factors or associated weakness or numbness, he also has back pain .    Review of Systems Denies recent fever or chills. Denies sinus pressure, nasal congestion, ear pain or sore throat. Denies chest congestion, productive cough or wheezing. Denies chest pains, palpitations, paroxysmal nocturnal dyspnea, orthopnea and leg swelling Denies abdominal pain, nausea, vomiting,diarrhea or constipation.  Denies rectal bleeding or change in bowel movement. Denies dysuria, frequency, hesitancy or incontinence. Denies  seizure, numbness, or tingling. Denies depression, anxiety or insomnia. Denies skin break down or rash.        Objective:   Physical Exam Patient alert and oriented and in no Cardiopulmonary distress.  HEENT: No facial asymmetry, EOMI, no sinus tenderness, TM's clear, Oropharynx pink and moist.  Neck supple no adenopathy.  Chest: Clear to auscultation bilaterally.  CVS: S1, S2 no murmurs, no S3.  ABD: Soft non tender. Bowel sounds normal.  Ext: No edema  MS: Adequate ROM spine, shoulders, hips and knees.  Skin: Intact, no ulcerations or rash noted.  Psych: Good eye contact, normal affect. Memory intact not anxious or depressed appearing.  CNS: CN 2-12 intact, power, tone and sensation normal throughout.        Assessment & Plan:  1.Hypertension:Controlled, no changes in  medication.  2.GERD with h.pylori treated: stable continue medication 3. Headaches: unchanged: pt encouraged to reduce stress, and get sufficient sleep Back pain: unchanged

## 2011-02-01 NOTE — Assessment & Plan Note (Signed)
NAMEMarland Kitchen  [name], [name]               CHART#:  [id number]   DATE:  11/11/2008                       DOB:  [birth year]   Darl Householder CARE PHYSICIAN/>  Norwood Levo. Moshe Cipro, MD   REASON FOR CONSULTATION:  To follow duodenal ulcer, work-in for  abdominal pain.   SUBJECTIVE:  The patient is a 40 year old African American male.  He has  a history of large duodenal ulcer with GI bleeding.  He was last  evaluated at [facility] when he  presented with anemia.  He was admitted to [facility] on 09/02/2008 through 09/04/2008.  He had an  EGD on 09/02/2008, which showed a persistent ulcer visualize the  duodenal bulb, D2 junction.  There was no evidence of active bleeding.  He did receive 1 unit of packed RBCs in that stay.  He was found to be  H. pylori positive.  He was treated for a second time.  He was also  found to have chronic renal insufficiency and was discharged with a  creatinine of 1.5.  He did complete his H. pylori treatment.  He has not  been taking his Nexium on a regular basis.  He tells me he missed 2 days  over the weekend, given the fact that he ran out of medicine.  He came  by yesterday to get samples.  He is complaining of some fatigue.  He has  had pressure sensation to his epigastrium.  He tells me he is not  sleeping well.  He does have some occasional shortness of breath on  exertion.  He denies any melena or rectal bleeding.  He has had  anorexia.  He is having a bowel movement once or twice a day, which is  nonbloody.  He denies any NSAID use.   His workup has been extensive, which began with identification of a  large duodenal ulcer by Dr. Gala Romney on EGD on 06/20/2008.  Biopsy it was  felt to possibly be malignant; however this was not confirmed on biopsy,  which just showed active inflammation.  He then had EUS at [facility] on 07/03/2008 by Dr. Jerene Pitch.  It  showed  3-cm duodenal ulcer.  Jumbo biopsies were benign as well.  It was  felt that he was not adequately maintained medically because he was not  taking PPI at the time he was on H2 blocker.  He has been on b.i.d. PPI  since that time.  However, he does admit to occasionally skipping doses  when he runs out.  His weight has been stable over the last 2 months.   CURRENT MEDICATIONS:  Azor 10/40 mg daily and Nexium 40 mg b.i.d.   ALLERGIES:  No known drug allergies.   OBJECTIVE:  VITAL SIGNS:  Weight 213 pounds, height 70 inches,  temperature 98.5, blood pressure 140/98, and pulse 76.  GENERAL:  He is a well-developed, well-nourished [place] American male,  in no acute distress.  HEENT:  Sclerae clear, nonicteric.  Conjunctivae pink.  Oropharynx pink  and moist without any lesions.  CHEST:  Heart regular rate and rhythm.  Normal S1, S2 without murmurs,  clicks, rubs, or gallops.  ABDOMEN:  Positive bowel sounds x4.  No bruits auscultated.  Soft,  nontender, nondistended without palpable mass  or hepatosplenomegaly.  No  rebound, tenderness, or guarding.  EXTREMITIES:  Without clubbing or edema.  RECTAL:  No external lesions visualized.  Good sphincter tone.  No  internal masses palpated.  Small amount of light brown secretions were  obtained, which were Hemoccult negative.   ASSESSMENT:  The patient is a 40 year old African American male with a  history of large persistent duodenal ulcer with intermittent  gastrointestinal bleeding, which has required transfusion.  He was H.  pylori positive, status post treatment twice.  I do feel given the size  of his ulcer that we need to be sure Helicobacter pylori has been  eradicated.  We need to recheck his hemoglobin at this time considering  he is having constitutional symptoms.   PLAN:  1. Check a stat CBC today.  2. Check H. pylori stool antigen.  3. Nexium 40 mg b.i.d., #62 with 2 refills.  4. He is to avoid all NSAIDs.  5. Further  recommendations to follow.       Vickey Huger, N.P.  Electronically Signed     R. Garfield Cornea, M.D.  Electronically Signed    KJ/MEDQ  D:  11/11/2008  T:  11/11/2008  Job:  [id number]   cc:   Norwood Levo. Moshe Cipro, M.D.

## 2011-02-01 NOTE — Op Note (Signed)
[name], [name]              ACCOUNT NO.:  [id number]   MEDICAL RECORD NO.:  [id number]          PATIENT TYPE:  AMB   LOCATION:  DAY                           FACILITY:  [facility]   PHYSICIAN:  R. Garfield Cornea, M.D. DATE OF BIRTH:  [birth year]   DATE OF PROCEDURE:  DATE OF DISCHARGE:                               OPERATIVE REPORT   ADDENDUM:  [name] had a CT of the abdomen and pelvis without any oral  contrast ordered by Dr. Moshe Cipro on June 03, 2008.  I have the copy  of the report, which we read as no acute intra-abdominal findings on  pelvis and abdomen.   I would very much like to review the actual films with another  radiologist in the near future.  We will follow up biopsies as per our  original plan and make further recommendations in the very near future.      Bridgette Habermann, M.D.  Electronically Signed     RMR/MEDQ  D:  06/20/2008  T:  06/20/2008  Job:  [id number]   cc:   Norwood Levo. Moshe Cipro, M.D.  Fax: [phone]

## 2011-02-01 NOTE — Consult Note (Signed)
NAMEKIJANA, FRUIN              ACCOUNT NO.:  [id number]   MEDICAL RECORD NO.:  [id number]         PATIENT TYPE:  PAMB   LOCATION:  DAY                           FACILITY:  [facility]   PHYSICIAN:  R. Garfield Cornea, M.D. DATE OF BIRTH:  01-12-1971   DATE OF CONSULTATION:  DATE OF DISCHARGE:                                 CONSULTATION   REQUESTING PHYSICIAN:  Norwood Levo. Moshe Cipro, MD   REASON FOR CONSULTATION:  Epigastric pain, history of H. pylori.   HISTORY OF PRESENT ILLNESS:  Mr. Yeiser is a 40 year old African  American male.  He has a 70-month history of what began as intermittent  epigastric pain.  It would wax and wane.  It was usually worse after he  had eaten.  He tells me that more recently, it has become constant.  It  is usually worse at bedtime.  He feels as though something is hung  there.  He denies any previous history of heartburn or indigestion.  He  does have nausea with the pain.  He did have a few episodes of vomiting,  none recently.  It is usually made worse 15 minutes after eating.  He  has tried Kapidex 60 mg daily for 1 week, which did not seem to help  any.  He had also completed a Prevpac for a positive serum H. pylori.  He occasionally has seen a dark black stool about 2-3 months ago when he  first was being evaluated by Dr. Moshe Cipro.  He has not noticed any melena  or rectal bleeding since that time.  He generally has a soft brown bowel  movement daily.  He denies any constipation or diarrhea.  His weight is  down about 8 pounds in the last 6 months.  He attributes this to being  afraid to eat because of the pain.  He denies any fever or chills.  Denies any NSAID use.  He has had an abdominal ultrasound, which showed  a simple left renal cyst and a minimally complicated right renal cyst  containing of septation and was otherwise normal.  He had a CT scan of  the abdomen and pelvis with contrast on June 03, 2008, which was  normal.   PAST MEDICAL AND  SURGICAL HISTORY:  H. pylori status post treatment in  July 2009, hypertension.  He has had right foot surgery.   CURRENT MEDICATIONS:  1. Hydrocodone p.r.n.  2. Azor 10/40 mg daily.  3. Hydrochlorothiazide 25 mg daily.   ALLERGIES:  No known drug allergies.   FAMILY HISTORY:  There is no known family history of colorectal  carcinoma or other chronic GI problems.  Mother has history of diabetes  mellitus, migraine headaches, and hypertension.  Father has  hypertension.  He has 2 healthy brothers.   SOCIAL HISTORY:  Mr. Marolt is married.  He has 3 healthy children.  He is unemployed.  He has a 15-pack-year history of tobacco use.  Currently smokes only about 2 packs per week.  Denies any alcohol.  He  has a remote history of marijuana use.  Denies any current drug use.  REVIEW OF SYSTEMS:  See HPI, otherwise negative.   PHYSICAL EXAMINATION:  VITAL SIGNS:  Weight 226 pounds, height 70  inches, temp 98.6, pulse 76, blood pressure 122/78.  GENERAL:  He is a well-developed, well-nourished African American male  in no acute distress.  HEENT:  Sclerae clear, nonicteric.  Conjunctivae pink.  Oropharynx pink  and moist without any lesions.  NECK:  Supple without mass or thyromegaly.  CHEST:  Heart regular rate and rhythm.  Normal S1 and S2 without  murmurs, clicks, rubs, or gallops.  LUNGS:  Clear to auscultation bilaterally.  ABDOMEN:  Positive bowel sounds x4.  No bruits auscultated.  Soft,  nontender, nondistended without palpable mass or hepatosplenomegaly.  No  rebound, tenderness, or guarding.  EXTREMITIES:  Without clubbing or edema.  SKIN:  Warm and dry without any rash or jaundice.   IMPRESSION:  Mr. Tomson is a 40 year old male with a 33-month history  of epigastric pain, which is made worse postprandially.  It is usually  nocturnal.  It has not responded to proton pump inhibitor nor Prevpac  treatment.  It is also associated with intermittent nausea and vomiting  and  an 8-pound weight loss.  He is going to need further evaluation to  rule out peptic ulcer disease as a culprit, especially given his history  of dark stools.  Gallbladder etiology/biliary dyskinesia remain in the  differential as well.  It is possible that he could also have erosive  esophagitis.   PLAN:  1. EGD with Dr. Gala Romney in the near future.  I discussed procedure      including risks and benefits, which include, but not limited to      bleeding, infection, perforation, and drug reaction.  He agrees      with the plan and consent will be obtained.  2. Suggest colonoscopy at age 19.  43. If EGD is negative, we would pursue HIDA scan.   Thank you Dr. Moshe Cipro for allowing [place] to participate in the care of Mr.  Matel.      Vickey Huger, N.P.      Bridgette Habermann, M.D.  Electronically Signed    KJ/MEDQ  D:  06/11/2008  T:  06/12/2008  Job:  [id number]

## 2011-02-01 NOTE — Op Note (Signed)
[name], [name]              ACCOUNT NO.:  [id number]   MEDICAL RECORD NO.:  [id number]          PATIENT TYPE:  AMB   LOCATION:  DAY                           FACILITY:  [facility]   PHYSICIAN:  R. Garfield Cornea, M.D. DATE OF BIRTH:  [birth year]   DATE OF PROCEDURE:  06/20/2008  DATE OF DISCHARGE:                               OPERATIVE REPORT   OPERATION:  Esophagogastroduodenoscopy with small bowel biopsy.   INDICATIONS FOR PROCEDURE:  A 40 year old African American gentleman  with a 22-month history of incessant progressing predominantly  postprandial epigastric pain although he has the pain all the time these  days.  He has not responded to en empiric course of acid suppression  therapy or treatment for H. pylori prep and based on positive  serologies, he has lost 8 pounds recently.  A gallbladder ultrasound  demonstrated no abnormalities.  EGD now being done.  Risks, benefits,  alternatives, limitations have been reviewed previously and again today  at the bedside.   PROCEDURE NOTE:  O2 saturation, blood pressure, pulse, respirations  monitored throughout entire procedure.  Conscious sedation and Versed 6  mg IV, Demerol 125 mg IV in divided doses.  Cetacaine spray for topical  pharyngeal anesthesia.   INSTRUMENT:  Pentax video chip system.   FINDINGS:  Examination tubular esophagus revealed a widely patent  esophagus with normal-appearing mucosa.  EG junction easily traversed.  Stomach:  Gas cavity was emptied, insufflated well with air.  Thorough  examination of the gastric mucosa including retroflex view  of the  proximal stomach, esophagogastric junction demonstrated only a small  hiatal hernia.  Pylorus patent, easily traversed.  Examination of bulb  revealed no mucosal abnormalities; however examination the second  portion of duodenum revealed a large area of ulceration involving most  of the medial wall of the duodenum with heaped up margins.  The duodenum  through  this area was patent.  Examination of the more distal second and  third portion revealed no mucosal abnormalities.   Therapeutic/diagnostic maneuvers performed.  The area of ulceration was  biopsied multiple times for histologic study.  The patient tolerated the  procedure well and was reactive to endoscopy.   IMPRESSION:  Normal esophagus, small hiatal hernia, normal stomach,  patent pylorus, normal bulb.  The very abnormal second portion of the  duodenum with large area of ulcerated mucosa involving the medial wall  heaped up surrounding abnormal appearing mucosa status post biopsy.   The duodenum remains patent at this time.   I am afraid this ulceration is going to be due to a malignant process.  It is most atypical for benign peptic ulcer disease.   RECOMMENDATIONS:  1. Follow up on plan.  2. Chem 20.  CBC today.  3. Proceed with abdominal and pelvic CT with IV, oral contrast as soon      as can be arranged.  4. Further recommendations to follow.   I have discussed my findings and recommendations with Mr. Polson and  his wife in endoscopy today.      Bridgette Habermann, M.D.  Electronically Signed  RMR/MEDQ  D:  06/20/2008  T:  06/20/2008  Job:  [id number]   cc:   Norwood Levo. Moshe Cipro, M.D.  Fax: [phone]

## 2011-02-01 NOTE — Assessment & Plan Note (Signed)
NAMEMarland Dennis  ALIOUNE, BLAIZE               CHART#:  [id number]   DATE:  09/01/2008                       DOB:  December 04, 1970   CHIEF COMPLAINT:  Abdominal pain and bleeding.   SUBJECTIVE:  The patient is a 40 year old gentleman with history of  duodenal ulceration diagnosed back on June 20, 2008, by Dr. Gala Romney.  He  presented with 93-month history of progressive epigastric pain and weight  loss.  In the second portion of the duodenum, he had a very large area  of ulceration involving most of the medial wall of the duodenum with  heaped-up margins.  Area was biopsied and showed active inflammation  with villous blunting.  Dr. Gala Romney was concerned about possibility of  malignancy.  He was referred to Dr. Jerene Pitch for EUS, which was performed  on 07/03/2008.  They measured a 3-cm ulceration at the bulb-D2 junction.  The ulcerative lesion also broke the muscularis propria with an adjacent  1.5 x 1 cm lymph node.  They were concerned about malignancy.  The  biopsy came back active duodenitis only.  The patient tells me that he  has a followup EGD scheduled at [facility] on September 08, 2008.  He states  that he has continued to have ongoing epigastric pain, particularly  worse with meals.  Within 20 minutes after eating, he has excruciating  pain.  Over the weekend, he started vomiting blood and he went to the  emergency department last night.  At around 11o'clock p.m. last night,  his hemoglobin was 11.8.  He has had 4 melenic stools. He has had no  further hematemesis today, because states that he is bulging up fluid  that tastes like blood.  He already had the appointment for today and he  was told that he just need to follow up with [place].  He has been on  ranitidine 150 mg b.i.d.  It is not clear why he is not on PPI therapy.  He has been treated for H. pylori in the past.  His weight is down  another 12 pounds since September 2009.  He says that he feels weak.  He  was very tearful during the  interview.   CURRENT MEDICATIONS:  Azor 10/40 mg daily, ranitidine 150 mg b.i.d.  Denies NSAIDs or aspirin use.   ALLERGIES:  No known drug allergies.   PHYSICAL EXAMINATION:  VITAL SIGNS:  Weight 214, height 5 feet 10  inches, temp 98.5, blood pressure 120/78, and pulse 92.  GENERAL:  Pleasant, well-nourished, well-developed gentleman in no acute  distress.  He is tearful during the interviews.  SKIN:  Warm and dry.  No jaundice.  HEENT:  Sclerae nonicteric.  Oropharyngeal mucosa moist and pink.  CHEST:  Lungs are clear to auscultation.  CARDIOVASCULAR:  Regular rate and rhythm.  No murmurs, rubs, or gallops.  ABDOMEN:  Positive bowel sounds.  Abdomen is soft and nondistended.  He  has moderate tenderness in the epigastrium to deep palpation.  No  rebound or guarding, although he asked  not to examine his epigastrium  due to the pain.  No organomegaly or masses.  LOWER EXTREMITIES:  No edema.   LABORATORY DATA:  As above.  In addition, his white count was 6100.  LFTs normal.  He had acute abdominal series yesterday, which was  negative for free air.  IMPRESSION:  The patient is a 40 year old gentleman with a very large  duodenal ulceration, which has been suspicious for malignancy, but not  yet proven.  He has persistent abdominal pain and ongoing weight loss.  Now, he has associated bleeding.  He has followup  esophagogastroduodenoscopy scheduled for the 21st at [facility], but we  would likely need to have intervention prior to then.   PLAN:  1. Stat CBC.  2. Lortab 7.5/500 mg p.o. q.4-6 h. p.r.n. pain, no more than 6 daily,      #30, 0 refills.  3. Stop ranitidine, begin Nexium 40 mg p.o. b.i.d., #30, samples      provided, first dose now with aa sip of water.  4. N.p.o. until we receive blood work.  5. Further recommendations to follow.   Addendum:  Patient's Hgb dropped to 10.2.  Discussed with Dr. Gala Romney.  Advised patient and wife to go to ER at [facility] for definitive   treatment.  Patient went to ER and waited for 5 hours before he left.  I  spoke with Estill Bamberg, RN for [facility] after patient left and was not  seen.  She is arranging for patient to be seen by GI today (09/02/08).       Neil Crouch, P.A.  Electronically Signed     R. Garfield Cornea, M.D.  Electronically Signed    LL/MEDQ  D:  09/01/2008  T:  09/01/2008  Job:  [id number]   cc:   Norwood Levo. Moshe Cipro, M.D.  Aviva Signs

## 2011-02-04 NOTE — Consult Note (Signed)
NAME:  [name], [name]                        ACCOUNT NO.:  [id number]   MEDICAL RECORD NO.:  [id number]                   PATIENT TYPE:  INP   LOCATION:  A308                                 FACILITY:  [facility]   PHYSICIAN:  Kofi A. Merlene Laughter, M.D.              DATE OF BIRTH:  05-31-1971   DATE OF CONSULTATION:  03/08/2004  DATE OF DISCHARGE:                                   CONSULTATION   REASON FOR CONSULTATION:  Intractable headache.   IMPRESSION:  The patient's headache is most likely due to accelerated  hypertension, although the blood pressure has improved.  The headache may  lag behind, however.  So far, there is no other clear reason for the  patient's headache.  Other potential causes of headache including AVM or  other rare causes are a possibility.  I think these are less likely.  At  this time I do not believe the patient has a post lumbar puncture low-  pressure headache.   RECOMMENDATIONS:  1. MRI of the brain, which has already been ordered today, will follow     results of this.  2. A trial of Fioricet can be helpful for headache relief.  3. I am also going to suggest thyroid function testing.   HISTORY:  This is a 40 year old African-American man who apparently has a  four to five-year history of hypertension that has been relatively  uncontrolled.  The patient presented to the hospital with a relatively acute  onset of lightheadedness, dizziness, and headache.  The patient describes  the dizzy spell as unsteadiness of gait.  He was noticed to have elevated  blood pressure in the emergency room of 0000000 with a diastolic of [id number].  He was  treated with labetalol which normalized his blood pressure to 127/87.  He,  however, continues to complain of significant headache which resulted in the  patient being worked up with a CT scan of the brain and also lumbar  puncture.  The results of the lumbar puncture have been essentially  unremarkable.  Imaging of the brain was also  normal.   The patient has been admitted to the hospital for further treatment of this  and also for mildly elevated creatinine of 1.8.  The patient has been  hospitalized for the past four days.  His headache has persisted, and, in  fact, he got an injection for headache for the first three days.  His  headache has improved somewhat today, although on sitting up this morning  after eating an apple, he developed severe bitemporal headache described as  throbbing.  He eventually actually had some vomiting associated with his  headache.  The headaches are all the same.  Again, they are located in the  bitemporal region, associated with nausea or emesis sometimes.  He does not  clearly report any photophobia or phonophobia.  There is no visual  obscurations.  No focal numbness or  weakness are reported.   PAST MEDICAL HISTORY:  Hypertension.   FAMILY HISTORY:  Significant for hypertension, diabetes.   ADMISSION MEDICATIONS:  1. Benicar 20/12.5 daily.  2. Norvasc 5 mg daily.   SOCIAL HISTORY:  He is married and has one daughter.  No reports of alcohol  or illicit drug use.  He does smoke cigarettes.   REVIEW OF SYSTEMS:  As stated in History of Present Illness.  He probably  had some cough and back pain on admission.  No GI symptoms.  No GU symptoms  are reported.   PHYSICAL EXAMINATION:  GENERAL:  A tall, pleasant, gentleman in no acute  distress.  VITAL SIGNS: He has been afebrile since admission to the hospital.  Current  vital signs:  Temperature 98.6, pulse 88, respirations 20, blood pressure  136/83.  HEAD, EYES, EARS, NOSE, AND THROAT:  Evaluation shows that the neck is  supple. Extensive auscultation of head and neck region does not reveal any  bruits.  LUNGS:  Clear to auscultation bilaterally.  CARDIOVASCULAR;  Normal S1 and S2.  ABDOMEN:  Soft.  EXTREMITIES:  No varicosities, no edema noted.  NEUROLOGIC:  The patient is awake, alert, converses fluently, coherently.  He  has no dysarthria or dysphagia noted.  Cranial nerve evaluation shows the  pupils are 3 mm and briskly reactive.  Extraocular movements are intact.  Funduscopic examination shows flat disks with spontaneous venous pulsations.  Facial muscle strength is normal.  Tongue is midline.  Uvula is midline  Shoulder shrugs are normal.  Motor examination shows normal tone, bulk, and  strength. There is no pronator drift.  Coordination is intact.  Reflexes  posterior and symmetric.  Plantar reflexes are both downgoing.  Sensory  examination normal to temperature, light touch.  Gait is slightly unsteady.  He does not report any significant worsening of the headache on standing up,  although he does report occasionally having worsening headache if he sits up  too quickly.   LABORATORY AND X-RAY DATA:  CSF analysis:  No organism noted on Gram stain,  no cultures so far.   Glucose 59, protein 41, wbc's 1, rbc's 2.  Urinalysis negative.  Hemoglobin  15.9 on admission, WBC 45.9, platelet count 249.  Sodium 140, potassium 3.8,  creatinine 1.8, BUN 16.  Other chemistries and liver enzymes normal.  CPK  152.   Thanks for this consultation.      ___________________________________________                                            Loreli Slot Merlene Laughter, M.D.   KAD/MEDQ  D:  03/08/2004  T:  03/08/2004  Job:  [id number]

## 2011-02-04 NOTE — Discharge Summary (Signed)
NAME:  MASSI, SHUTE                        ACCOUNT NO.:  [id number]   MEDICAL RECORD NO.:  [id number]                   PATIENT TYPE:  INP   LOCATION:  A308                                 FACILITY:  [facility]   PHYSICIAN:  Tesfaye D. Legrand Rams, M.D.              DATE OF BIRTH:  28-May-1971   DATE OF ADMISSION:  03/04/2004  DATE OF DISCHARGE:  03/06/2004                                 DISCHARGE SUMMARY   DISCHARGE DIAGNOSES:  1. Viral syndrome.  2. Severe headache.  3. Hypertension.   DISCHARGE MEDICATIONS:  1. Norvasc 5 mg p.o. once daily.  2. Benicar 20/12.5 one tablet p.o. once daily.   DISPOSITION:  Patient will be discharged home in stable condition.   HOSPITAL COURSE:  This is a 40 year old male patient with a history of  hypertension who came to emergency room with complaint of severe headache,  nausea, and vomiting.  Patient had no fever or chills.  However, he had  muscle aches and generalized weakness.  Patient had a CT scan of the head  and lumbar puncture which were negative.  Patient was admitted and was  treated with IV pain medicine and IV fluid.  His symptoms gradually improved  and patient will be discharged home in stable condition.  He is going to be  discharged back to home in stable condition.     ___________________________________________                                         Dimas Millin Legrand Rams, M.D.   TDF/MEDQ  D:  03/06/2004  T:  03/07/2004  Job:  [id number]

## 2011-02-04 NOTE — Op Note (Signed)
[name], ROMANS NO.:  [id number]   MEDICAL RECORD NO.:  [id number]          PATIENT TYPE:  INP   LOCATION:  2550                         FACILITY:  [facility]   PHYSICIAN:  Newt Minion, MD     DATE OF BIRTH:  [birth year]   DATE OF PROCEDURE:  11/11/2004  DATE OF DISCHARGE:                                 OPERATIVE REPORT   PREOPERATIVE DIAGNOSIS:  Right mid-foot fracture dislocation.   POSTOPERATIVE DIAGNOSIS:  Right mid-foot fracture dislocation.   PROCEDURE:  Open reduction and internal fixation of right mid-foot and  Lisfranc joint.   SURGEON.:  Newt Minion, MD   ANESTHESIA:  LMA.   ESTIMATED BLOOD LOSS:  Minimal.   ANTIBIOTICS:  One gram of Kefzol.   TOURNIQUET TIME:  Esmarch at the ankle for approximately 40 minutes.   DISPOSITION:  To PACU in stable condition with no drains.   INDICATIONS FOR PROCEDURE:  The patient is a 40 year old gentleman who is  status post a crush injury to his right foot on December 12,2005. The  patient has failed conservative care.  CT scan confirms a fracture  dislocation across the entire mid-foot and the patient presents at this time  for open reduction and internal fixation.  The risks and benefits were  discussed including infection, neurovascular injury, persistent pain and  need for additional surgery.  The patient states he understands and wishes  to proceed at this time.   DESCRIPTION OF PROCEDURE:  The patient was brought to OR room 4 and  underwent general LMA anesthetic.  After adequate level of anesthesia was  obtained, the patient's right lower extremity was prepped using DuraPrep and  draped into a sterile field and an India was used to cover all exposed skin.  A dorsal incision was made over the web space between the 1st and 2nd  metatarsals.  Blunt dissection was carried down to bone to protect the  dorsalis pedis neurovascular bundle.  Then subperiosteal dissection was  carried out to the 1st  metatarsal medial cuneiform. This joint was loose and  unstable.  A cortical screw was then placed for the medial cuneiform, dorsal-  proximal to distal-plantar, into the 1st metatarsal to stabilize the 1st  metatarsal medial cuneiform joint.  A second screw was placed across the  Lisfranc joint from the medial cuneiform to the 2nd metatarsal.  A third  screw was placed from the medial to the middle cuneiform and a third screw  was placed from the dorsal distal base of the 2nd metatarsal to the proximal  plantar aspect of the middle cuneiform.  The Esmarch was released after 40  minutes; hemostasis was  obtained.  C-arm fluoroscopy was used throughout the case and verified  reduction and screw placement.  The incisions were closed using a 3-0 nylon  with a far-near/near-far suture.  The wound was covered Adaptic orthopedic  sponges, sterile Webril and Coban dressing. The patient was extubated and  taken to PACU in stable condition.      MVD/MEDQ  D:  11/11/2004  T:  11/11/2004  Job:  [id number]

## 2011-02-04 NOTE — H&P (Signed)
[name], [name]                          ACCOUNT NO.:  [id number]   MEDICAL RECORD NO.:  [id number]                  PATIENT TYPE:   LOCATION:                                       FACILITY:   PHYSICIAN:  Angus G. Everette Dennis, M.D.              DATE OF BIRTH:   DATE OF ADMISSION:  DATE OF DISCHARGE:                                HISTORY & PHYSICAL   HISTORY OF PRESENT ILLNESS:  A 40 year old male patient of Dr. Brandon Melnick D.  Fanta who was admitted through the emergency department at [facility] with the chief complaint being headache and weakness, elevated  blood pressure.  The patient received a rather extensive workup in the  emergency department over a period of several hours including a head CT and  lumbar puncture.  The CT of the head showed no acute intracranial  abnormalities.  The lumbar puncture was essentially normal.  Glucose 59,  total protein 41.  The patient also was noted to have elevated blood  pressure of systolic [id number].  Following treatment, the blood pressure  normalized to 127/85.  The patient was given labetalol and taken early, his  Benicar and Norvasc.  There was some question as to whether or not this was  viral syndrome associated with hypertension.   LABORATORY DATA:  Laboratory data.  Admission CBC, WBC 4900, hemoglobin  15.6, hematocrit 45.1.  Chemistries:  Sodium 140, potassium 3.8, chloride  101, CO2 31, glucose 88, BUN 16, creatinine 1.8.  Calcium 9.6.  Cardiac  enzymes:  CK 152, CK-MB 1.4, troponin 0.03.   The patient was started on intravenous fluids, given Dilaudid on several  occasions for headache and subsequently was admitted.   SOCIAL HISTORY:  The patient does not use alcohol or drugs but does smoke.   FAMILY HISTORY:  Father has a history of hypertension.   PAST MEDICAL HISTORY/SURGICAL HISTORY:  The patient has been treated for  hypertension in the past.   MEDICATION LIST:  1. Norvasc, 5 mg.  2. Benicar HCT 20/12.5  mg daily.   REVIEW OF SYSTEMS:  HEENT:  The patient has generalized headache, dizziness,  ringing in left ear.  CARDIOPULMONARY:  The patient has had some cough, back  pain.  GI:  No bowel irregularity or bleeding.  GU:  No dysuria or  hematuria.   PHYSICAL EXAMINATION:  GENERAL:  An alert male.  Blood pressure 133/75,  pulse 52, respirations 18, temperature 98.  HEENT:  Eyes: PERRLA.  TM's negative.  Oropharynx benign.  NECK:  Supple, no JVD or thyroid abnormalities.  LUNGS:  Clear to P&A.  HEART:  Regular rhythm, no murmurs.  ABDOMEN:  No palpable organs or masses.  SKIN:  Warm and dry.  EXTREMITIES:  Free of edema.  NEUROLOGIC:  No focal deficits.  Reflexes are equal.   DIAGNOSES:  1. Hypertension.  2. Generalized headache of undetermined etiology.  3. Azotemia of  undetermined etiology.     ___________________________________________                                         [name], M.D.   AGM/MEDQ  D:  03/04/2004  T:  03/04/2004  Job:  [id number]

## 2011-02-04 NOTE — Consult Note (Signed)
[name], [name]                        ACCOUNT NO.:  [id number]   MEDICAL RECORD NO.:  [id number]                   PATIENT TYPE:  INP   LOCATION:  A308                                 FACILITY:  [facility]   PHYSICIAN:  Alison Murray, M.D.             DATE OF BIRTH:  13-May-1971   DATE OF CONSULTATION:  03/08/2004  DATE OF DISCHARGE:                                   CONSULTATION   CONSULTING PHYSICIAN:  Alison Murray, M.D.   REFERRING PHYSICIAN:  Tesfaye D. Legrand Rams, M.D.   REASON FOR CONSULTATION:  Renal failure.   HISTORY OF PRESENT ILLNESS:  The patient is a 40 year old African-American  male with a past medical history of hypertension who presently came to the  emergency room with complaints of a severe headache, weakness and with high  and uncontrolled hypertension.  He was admitted to the hospital for workup  of his severe headache.  During this admission the patient was found to have  an elevated creatinine; hence, the consult is called.  The patient states  that he has had high blood pressure for about four to five years.  However,  there is no history of renal insufficiency.  He has a history of  intermittent headache, but this is his first time to have this severe and  continuous headache.  He denies any history of diabetes nor history of  kidney stones nor history of urinary tract infections.   PAST MEDICAL HISTORY:  1. History of hypertension.  2. Intermittent headache.   ALLERGIES:  No known allergies.   MEDICATIONS:  1. Norvasc 10 mg p.o. daily.  2. Catapres 0.1 mg p.o. t.i.d.  3. IV fluids at 120 mL per hour.  4. Avapro 150 mg p.o. daily.  5. Ambien 5 mg p.o. daily.  6. Tylenol p.r.n.  7. Dilaudid 10 mg IV q.3 to 4h p.r.n.  8. Phenergan on a p.r.n. basis for nausea and vomiting.   SOCIAL HISTORY:  No history of alcohol abuse or drug use.  The patient  smokes about one pack a day.   FAMILY HISTORY:  No history of hypertension or history of renal  insufficiency.   REVIEW OF SYMPTOMS:  HEENT:  Presently his main complaint is still the  headache.  The headache is bilateral and mainly occipital and intermittent.  He was feeling very well until this morning when he woke up with a severe  headache.  GI:  He denies any nausea or vomiting at this moment.  GENERAL:  He denies any fevers, chills or sweating.  CARDIAC:  He has no chest pain.  GU: He has nocturia and he claims also to have some polyuria when he was at  home.  EXTREMITIES:  He denies any swelling of his legs.   PHYSICAL EXAMINATION:  GENERAL:  The patient is alert and in no apparent  distress.  VITAL SIGNS:  Temperature is 98.3, blood pressure is 140/92, pulse  is 77,  respiratory rate is 20.  HEENT EXAM:  No conjunctival pallor. No icterus.  The oral mucosa seems to  be dry.  The neck is supple with no JVD.  CHEST:  Decreased breath sounds, otherwise seems to be clear.  No rales.  No  egophony.  No wheezing.  CARDIOVASCULAR:  His heart exam reveals a regular rate and rhythm with no  murmur.  ABDOMEN:  Soft with positive bowel sounds.  EXTREMITIES:  No edema.   LABORATORY DATA:  Blood work on June 16:  BUN was 60, creatinine 1.8 and  sodium 140. Yesterday his creatinine went down to 1.6 and presently his  creatinine is 1.7, BUN 12, sodium 134, potassium 3.7 and calcium is 8.0.  He  does not have any urea.  His CPK is 152.   PROBLEMS:  1. Mild renal insufficiency.  At this moment, it is not sure whether this is     acute or chronic.  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  At this moment, since the patient has had     a blood pressure as high as 205 during this admission, we need to rule     out acute renal failure associated with malignant hypertension, but     overall seems to be less likely.  Since the patient does not have any     urea and we are not sure  whether the patient has proteinuria, if he has     probably as a cause of proteinuria with renal insufficiency, that should     also be put in the differential diagnosis.  2. Hypertension, possibly idiopathic.  His blood pressure seems to be     controlled very well now.  3. Headache.  I am not sure whether this is related to his hypertension.     Presently his blood pressure is controlled and he still has a headache.     He had a CAT scan and also a spinal tap without significant findings.   RECOMMENDATIONS:  1. I will do a urinalysis.  2. I will do a 24-hour urine for creatinine clearance and protein.  3. We will do an ultrasound of the kidneys to evaluate kidney size and to     rule out obstruction.  4. If the patient has significant proteinuria, we will probably do a workup     for secondary cause of proteinuria and renal insufficiency.  5. We will continue his other medications as before.  6. I will follow the patient with you.      ___________________________________________                                            Alison Murray, M.D.   BB/MEDQ  D:  03/08/2004  T:  03/08/2004  Job:  [id number]   cc:   Tesfaye D. Legrand Rams, M.D.  [address]  [place]  [place] [address]  Fax: [phone]

## 2011-03-10 ENCOUNTER — Telehealth: Payer: Self-pay | Admitting: Family Medicine

## 2011-03-10 MED ORDER — ESOMEPRAZOLE MAGNESIUM 40 MG PO CPDR: 40.0000 mg | DELAYED_RELEASE_CAPSULE | Freq: Two times a day (BID) | ORAL | Status: DC

## 2011-03-10 NOTE — Telephone Encounter (Signed)
Med sent as requested 

## 2011-03-15 ENCOUNTER — Other Ambulatory Visit: Payer: Self-pay

## 2011-03-15 ENCOUNTER — Telehealth: Payer: Self-pay | Admitting: Family Medicine

## 2011-03-15 ENCOUNTER — Other Ambulatory Visit: Payer: Self-pay | Admitting: Family Medicine

## 2011-03-15 MED ORDER — HYDROCODONE-ACETAMINOPHEN 10-660 MG PO TABS: ORAL_TABLET | ORAL | Status: DC

## 2011-03-15 MED ORDER — AMLODIPINE-OLMESARTAN 10-40 MG PO TABS: 1.0000 | ORAL_TABLET | Freq: Every day | ORAL | Status: DC

## 2011-03-16 NOTE — Telephone Encounter (Signed)
This has been taken care of.

## 2011-03-17 ENCOUNTER — Telehealth: Payer: Self-pay | Admitting: Family Medicine

## 2011-03-17 NOTE — Telephone Encounter (Signed)
This was already taken care of the day she called. She needs to check with the pharmacy

## 2011-03-31 ENCOUNTER — Encounter: Payer: Self-pay | Admitting: Family Medicine

## 2011-04-07 ENCOUNTER — Ambulatory Visit (INDEPENDENT_AMBULATORY_CARE_PROVIDER_SITE_OTHER): Payer: Managed Care, Other (non HMO) | Admitting: Family Medicine

## 2011-04-07 ENCOUNTER — Encounter: Payer: Self-pay | Admitting: Family Medicine

## 2011-04-07 VITALS — BP 160/100 | HR 86 | Resp 16 | Ht 72.5 in | Wt 198.1 lb

## 2011-04-07 DIAGNOSIS — N3090 Cystitis, unspecified without hematuria: Secondary | ICD-10-CM | POA: Insufficient documentation

## 2011-04-07 DIAGNOSIS — I10 Essential (primary) hypertension: Secondary | ICD-10-CM

## 2011-04-07 DIAGNOSIS — M549 Dorsalgia, unspecified: Secondary | ICD-10-CM

## 2011-04-07 DIAGNOSIS — R7301 Impaired fasting glucose: Secondary | ICD-10-CM

## 2011-04-07 LAB — BASIC METABOLIC PANEL
Calcium: 9.1 mg/dL (ref 8.4–10.5)
Chloride: 105 mEq/L (ref 96–112)
Creat: 1.85 mg/dL — ABNORMAL HIGH (ref 0.50–1.35)
Sodium: 141 mEq/L (ref 135–145)

## 2011-04-07 LAB — POCT URINALYSIS DIPSTICK
Leukocytes, UA: NEGATIVE
Nitrite, UA: NEGATIVE
Protein, UA: 30
Urobilinogen, UA: 0.2
pH, UA: 6

## 2011-04-07 LAB — HEMOGLOBIN A1C: Mean Plasma Glucose: 94 mg/dL (ref <117)

## 2011-04-07 MED ORDER — CLONIDINE HCL 0.2 MG PO TABS: ORAL_TABLET | ORAL | Status: DC

## 2011-04-07 NOTE — Progress Notes (Signed)
  Subjective:    Patient ID: [name], male    DOB: [birth year], 40 y.o.   MRN: [id number]  HPI 3 month h/o nocturia, goes on avg 5 times in a 3 hour period, reports  a good stream, however experiencing feeling of incomplete emptying, denies penile d/c and no dysuria  Does drink a lot of sugary drinks , mother is diabetic Reports feeling very depressed at times due to being unemployed for approx 3 years. He is not suicidal or homicidal  Review of Systems Denies recent fever or chills. Denies sinus pressure, nasal congestion, ear pain or sore throat. Denies chest congestion, productive cough or wheezing. Denies chest pains, palpitations and leg swelling Denies abdominal pain, nausea, vomiting,diarrhea or constipation.   Denies dysuria, hesitancy or incontinence. Denies joint pain, swelling and limitation in mobility. C/o intermittent headaches,denies  seizures, numbness, or tingling.  Denies skin break down or rash.        Objective:   Physical Exam Patient alert and oriented and in no cardiopulmonary distress.  HEENT: No facial asymmetry, EOMI, no sinus tenderness,  oropharynx pink and moist.  Neck supple no adenopathy.  Chest: Clear to auscultation bilaterally.  CVS: S1, S2 no murmurs, no S3.  ABD: Soft non tender. Bowel sounds normal.  Ext: No edema  MS: Adequate ROM spine, shoulders, hips and knees.  Skin: Intact, no ulcerations or rash noted.  Psych: Good eye contact, normal affect. Memory intact not anxious or depressed appearing.  CNS: CN 2-12 intact, power, tone and sensation normal throughout.        Assessment & Plan:

## 2011-04-07 NOTE — Patient Instructions (Addendum)
F/u in 3 months  You will be referred to urology if you have no urinary tract infection.  Pls trade the tea and sodas for water, and drink most of your fluids, by 7pm  New additional med clonidine to be started at bedtime for your blood pressure , which is high   Chem 7 and HBA1C today

## 2011-04-08 ENCOUNTER — Other Ambulatory Visit: Payer: Self-pay | Admitting: *Deleted

## 2011-04-08 DIAGNOSIS — I10 Essential (primary) hypertension: Secondary | ICD-10-CM

## 2011-04-08 MED ORDER — CLONIDINE HCL 0.2 MG PO TABS: ORAL_TABLET | ORAL | Status: DC

## 2011-04-10 ENCOUNTER — Other Ambulatory Visit: Payer: Self-pay | Admitting: Family Medicine

## 2011-04-12 ENCOUNTER — Other Ambulatory Visit: Payer: Self-pay

## 2011-04-12 ENCOUNTER — Telehealth: Payer: Self-pay | Admitting: Family Medicine

## 2011-04-12 MED ORDER — AMPICILLIN 500 MG PO CAPS: 500.0000 mg | ORAL_CAPSULE | Freq: Four times a day (QID) | ORAL | Status: DC

## 2011-04-12 NOTE — Telephone Encounter (Signed)
I spoke with him earlier. He is aware

## 2011-04-16 NOTE — Assessment & Plan Note (Signed)
Unchanged, no change in meds

## 2011-04-16 NOTE — Assessment & Plan Note (Signed)
CCUA checked due to symptom. Pt will also need to be evaluated by urology

## 2011-04-16 NOTE — Assessment & Plan Note (Signed)
Uncontrolled, due to med non compliance. Importance of same stressed , and additional med. Clonidine prescribed

## 2011-04-20 ENCOUNTER — Telehealth: Payer: Self-pay | Admitting: Family Medicine

## 2011-04-21 NOTE — Telephone Encounter (Signed)
Called patient, left message.

## 2011-04-27 NOTE — Telephone Encounter (Signed)
Called patient, no answer 

## 2011-04-29 ENCOUNTER — Ambulatory Visit: Payer: Managed Care, Other (non HMO) | Admitting: Urology

## 2011-05-17 ENCOUNTER — Other Ambulatory Visit: Payer: Self-pay | Admitting: Family Medicine

## 2011-05-18 ENCOUNTER — Telehealth: Payer: Self-pay | Admitting: Family Medicine

## 2011-05-18 NOTE — Telephone Encounter (Signed)
Yes, it has been refilled

## 2011-06-20 LAB — DIFFERENTIAL
Basophils Relative: 1
Eosinophils Absolute: 0.1
Eosinophils Relative: 3
Neutrophils Relative %: 68

## 2011-06-20 LAB — COMPREHENSIVE METABOLIC PANEL
ALT: 9
AST: 14
Alkaline Phosphatase: 74
CO2: 26
Chloride: 104
GFR calc non Af Amer: 42 — ABNORMAL LOW
Glucose, Bld: 92
Potassium: 3.5
Sodium: 138

## 2011-06-20 LAB — CBC
Hemoglobin: 9.7 — ABNORMAL LOW
RBC: 2.88 — ABNORMAL LOW
WBC: 3.9 — ABNORMAL LOW

## 2011-06-24 LAB — CBC
MCHC: 33.8 g/dL (ref 30.0–36.0)
Platelets: 237 10*3/uL (ref 150–400)
RBC: 3.66 MIL/uL — ABNORMAL LOW (ref 4.22–5.81)
WBC: 6.1 10*3/uL (ref 4.0–10.5)

## 2011-06-24 LAB — DIFFERENTIAL
Eosinophils Absolute: 0.1 10*3/uL (ref 0.0–0.7)
Eosinophils Relative: 1 % (ref 0–5)
Lymphocytes Relative: 13 % (ref 12–46)
Lymphs Abs: 0.8 10*3/uL (ref 0.7–4.0)
Monocytes Absolute: 0.4 10*3/uL (ref 0.1–1.0)

## 2011-06-24 LAB — COMPREHENSIVE METABOLIC PANEL
ALT: 10 U/L (ref 0–53)
AST: 13 U/L (ref 0–37)
Albumin: 3.7 g/dL (ref 3.5–5.2)
CO2: 25 mEq/L (ref 19–32)
Calcium: 8.6 mg/dL (ref 8.4–10.5)
Chloride: 110 mEq/L (ref 96–112)
GFR calc Af Amer: >60 mL/min (ref >60)
GFR calc non Af Amer: 53 mL/min — ABNORMAL LOW (ref >60)
Sodium: 140 mEq/L (ref 135–145)

## 2011-06-24 LAB — TYPE AND SCREEN: Antibody Screen: NEGATIVE

## 2011-06-24 LAB — PROTIME-INR: Prothrombin Time: 14.1 seconds (ref 11.6–15.2)

## 2011-07-04 ENCOUNTER — Encounter (HOSPITAL_COMMUNITY): Payer: Self-pay | Admitting: *Deleted

## 2011-07-04 ENCOUNTER — Emergency Department (HOSPITAL_COMMUNITY)
Admission: EM | Admit: 2011-07-04 | Discharge: 2011-07-04 | Disposition: A | Discharge status: Home / Self Care | Payer: Managed Care, Other (non HMO) | Attending: Emergency Medicine | Admitting: Emergency Medicine

## 2011-07-04 DIAGNOSIS — R1110 Vomiting, unspecified: Secondary | ICD-10-CM | POA: Insufficient documentation

## 2011-07-04 DIAGNOSIS — K2980 Duodenitis without bleeding: Secondary | ICD-10-CM | POA: Insufficient documentation

## 2011-07-04 DIAGNOSIS — I10 Essential (primary) hypertension: Secondary | ICD-10-CM | POA: Insufficient documentation

## 2011-07-04 DIAGNOSIS — F17200 Nicotine dependence, unspecified, uncomplicated: Secondary | ICD-10-CM | POA: Insufficient documentation

## 2011-07-04 DIAGNOSIS — R51 Headache: Secondary | ICD-10-CM

## 2011-07-04 MED ORDER — SODIUM CHLORIDE 0.9 % IV BOLUS (SEPSIS)
1000.0000 mL | Freq: Once | INTRAVENOUS | Status: AC
  Administered 2011-07-04 (×2): 1000 mL via INTRAVENOUS

## 2011-07-04 MED ORDER — DIPHENHYDRAMINE HCL 50 MG/ML IJ SOLN
25.0000 mg | Freq: Once | INTRAMUSCULAR | Status: AC
  Administered 2011-07-04: 25 mg via INTRAVENOUS
  Filled 2011-07-04: qty 1

## 2011-07-04 MED ORDER — KETOROLAC TROMETHAMINE 60 MG/2ML IM SOLN: 60.0000 mg | Freq: Once | INTRAMUSCULAR | Status: DC

## 2011-07-04 MED ORDER — ONDANSETRON HCL 4 MG/2ML IJ SOLN
4.0000 mg | Freq: Once | INTRAMUSCULAR | Status: AC
  Administered 2011-07-04: 4 mg via INTRAVENOUS
  Filled 2011-07-04: qty 2

## 2011-07-04 MED ORDER — KETOROLAC TROMETHAMINE 30 MG/ML IJ SOLN
30.0000 mg | Freq: Once | INTRAMUSCULAR | Status: AC
  Administered 2011-07-04: 30 mg via INTRAVENOUS
  Filled 2011-07-04: qty 1

## 2011-07-04 MED ORDER — ONDANSETRON HCL 4 MG PO TABS: 4.0000 mg | ORAL_TABLET | Freq: Once | ORAL | Status: DC

## 2011-07-04 MED ORDER — HYDROCODONE-ACETAMINOPHEN 5-325 MG PO TABS
2.0000 | ORAL_TABLET | Freq: Once | ORAL | Status: AC
  Administered 2011-07-04: 2 via ORAL
  Filled 2011-07-04: qty 2

## 2011-07-04 MED ORDER — METOCLOPRAMIDE HCL 5 MG/ML IJ SOLN: 10.0000 mg | Freq: Once | INTRAMUSCULAR | Status: DC

## 2011-07-04 NOTE — ED Notes (Signed)
Pt c/o migraine, n/v. Pt vomiting in triage.

## 2011-07-04 NOTE — ED Provider Notes (Signed)
History     CSN: [id number] Arrival date & time: 07/04/2011  5:34 AM  Chief Complaint  Patient presents with  . Migraine  . Emesis  . Nausea    (Consider location/radiation/quality/duration/timing/severity/associated sxs/prior treatment) HPI Comments: Seen [id number].  Patient is a 40 y.o. male presenting with migraine and vomiting.  Migraine This is a new problem. The current episode started 3 to 5 hours ago. The problem occurs constantly. The problem has been gradually worsening. Associated symptoms include headaches. Pertinent negatives include no chest pain, no abdominal pain and no shortness of breath. Exacerbated by: nothing. The symptoms are relieved by nothing. Treatments tried: narcotic. The treatment provided no relief.  Emesis  Associated symptoms include headaches. Pertinent negatives include no abdominal pain.    Past Medical History  Diagnosis Date  . Chronic back pain   . Abdominal tenderness   . Unspecified essential hypertension   . Headache   . Obesity, unspecified   . Headache   . Essential hypertension, benign   . Duodenitis with bleeding 08/2008    with ulcer / admitted to [facility]     Past Surgical History  Procedure Date  . Foot fracture surgery     Family History  Problem Relation Age of Onset  . Diabetes Mother   . Hypertension Mother   . Hypertension Father     History  Substance Use Topics  . Smoking status: Current Everyday Smoker -- 0.5 packs/day    Types: Cigarettes  . Smokeless tobacco: Not on file  . Alcohol Use: Yes      Review of Systems  Respiratory: Negative for shortness of breath.   Cardiovascular: Negative for chest pain.  Gastrointestinal: Positive for vomiting. Negative for abdominal pain.  Neurological: Positive for headaches.  All other systems reviewed and are negative.    Allergies  Carbamazepine; Cyclobenzaprine hcl; and Prochlorperazine edisylate  Home Medications   Current Outpatient Rx  Name  Route Sig Dispense Refill  . AMLODIPINE-OLMESARTAN 10-40 MG PO TABS Oral Take 1 tablet by mouth daily. 30 tablet 3  . CLONIDINE HCL 0.2 MG PO TABS  One tablet at 9 pm every night 30 tablet 11  . ESOMEPRAZOLE MAGNESIUM 40 MG PO CPDR Oral Take 1 capsule (40 mg total) by mouth 2 (two) times daily. 60 capsule 1  . HYDROCODONE-ACETAMINOPHEN 10-660 MG PO TABS  TAKE 1 TABLET BY MOUTH TWICE DAILY AS NEEDED FOR SEVERE HEADACHE MAX 8 TABS PER WEEK 30 each 1    BP 183/97  Pulse 58  Temp 97.7 F (36.5 C)  Wt 190 lb (86.183 kg)  SpO2 98%  Physical Exam  Nursing note and vitals reviewed. Constitutional: He is oriented to person, place, and time. He appears well-developed and well-nourished.  HENT:  Head: Normocephalic and atraumatic.  Right Ear: External ear normal.  Left Ear: External ear normal.  Mouth/Throat: Oropharynx is clear and moist.  Eyes: EOM are normal.  Neck: Normal range of motion. Neck supple.  Cardiovascular: Normal rate, normal heart sounds and intact distal pulses.   Pulmonary/Chest: Effort normal and breath sounds normal.  Abdominal: Soft. Bowel sounds are normal.  Musculoskeletal: Normal range of motion.  Neurological: He is alert and oriented to person, place, and time.       Speech normal  Skin: Skin is warm and dry.    ED Course  Procedures (including critical care time)  Patient with h/o migraines here with headache that began last night. Similar to previous headaches. Improvement here with analgesic,  antiemetic. Pt feels improved after observation and/or treatment in ED.Pt stable in ED with no significant deterioration in condition. MDM Reviewed: nursing note and vitals         [name]. Joel Hauser, MD 07/04/11 [phone]

## 2011-07-07 ENCOUNTER — Other Ambulatory Visit: Payer: Self-pay | Admitting: Family Medicine

## 2011-07-08 ENCOUNTER — Telehealth: Payer: Self-pay | Admitting: Family Medicine

## 2011-07-08 MED ORDER — HYDROCODONE-ACETAMINOPHEN 10-660 MG PO TABS: ORAL_TABLET | ORAL | Status: DC

## 2011-07-08 NOTE — Telephone Encounter (Signed)
Sent in as requested 

## 2011-07-14 ENCOUNTER — Encounter: Payer: Self-pay | Admitting: Family Medicine

## 2011-07-18 ENCOUNTER — Encounter: Payer: Self-pay | Admitting: Family Medicine

## 2011-07-18 ENCOUNTER — Ambulatory Visit: Payer: Managed Care, Other (non HMO) | Admitting: Family Medicine

## 2011-08-29 ENCOUNTER — Other Ambulatory Visit: Payer: Self-pay

## 2011-08-29 ENCOUNTER — Other Ambulatory Visit: Payer: Self-pay | Admitting: Family Medicine

## 2011-08-29 ENCOUNTER — Telehealth: Payer: Self-pay | Admitting: Family Medicine

## 2011-08-29 MED ORDER — HYDROCODONE-ACETAMINOPHEN 10-660 MG PO TABS: ORAL_TABLET | ORAL | Status: DC

## 2011-08-29 NOTE — Telephone Encounter (Signed)
Wanted med filled until he comes in. Couldn't get appt before Jan 8th. Filled hydrocodone

## 2011-09-05 ENCOUNTER — Other Ambulatory Visit: Payer: Self-pay | Admitting: Family Medicine

## 2011-09-19 ENCOUNTER — Encounter: Payer: Self-pay | Admitting: Family Medicine

## 2011-09-27 ENCOUNTER — Ambulatory Visit: Payer: Managed Care, Other (non HMO) | Admitting: Family Medicine

## 2011-09-27 ENCOUNTER — Encounter: Payer: Self-pay | Admitting: Family Medicine

## 2011-09-29 ENCOUNTER — Telehealth: Payer: Self-pay

## 2011-09-29 NOTE — Telephone Encounter (Signed)
Nexium needs PA. Covered alt's are prilosec and omeprazole  [facility]

## 2011-09-29 NOTE — Telephone Encounter (Signed)
Pt needs appt has missed , will wait till he comes in to PA

## 2011-10-17 ENCOUNTER — Encounter: Payer: Self-pay | Admitting: Family Medicine

## 2011-10-18 ENCOUNTER — Ambulatory Visit (INDEPENDENT_AMBULATORY_CARE_PROVIDER_SITE_OTHER): Payer: Managed Care, Other (non HMO) | Admitting: Family Medicine

## 2011-10-18 ENCOUNTER — Encounter: Payer: Self-pay | Admitting: Family Medicine

## 2011-10-18 VITALS — BP 140/90 | HR 64 | Resp 16 | Ht 72.5 in | Wt 195.0 lb

## 2011-10-18 DIAGNOSIS — I10 Essential (primary) hypertension: Secondary | ICD-10-CM

## 2011-10-18 DIAGNOSIS — R51 Headache: Secondary | ICD-10-CM

## 2011-10-18 DIAGNOSIS — K279 Peptic ulcer, site unspecified, unspecified as acute or chronic, without hemorrhage or perforation: Secondary | ICD-10-CM

## 2011-10-18 MED ORDER — CLONIDINE HCL 0.3 MG PO TABS: ORAL_TABLET | ORAL | Status: DC

## 2011-10-18 NOTE — Patient Instructions (Signed)
F/u in 4 month  Please dO quit on your birthday, as planned   Fasting labs are past due, pls get as soon as possible. CBC, chem 7, lipid, pSA and tSH   Blood pressure is 140/90. Dose iNCREASE on clonidine to 0.3mg  one at bedtime. oK to take the 0.2mg  clonidine oNE and a hALF each night until finisged  I recommend neurology eval and an eye exam for uncontrolled headaches

## 2011-10-18 NOTE — Assessment & Plan Note (Signed)
Uncontrolled, will inc the clonidine 0.3mg 

## 2011-10-18 NOTE — Progress Notes (Signed)
  Subjective:    Patient ID: [name], male    DOB: 22-Oct-1970, 41 y.o.   MRN: [id number]  HPI The PT is here for follow up and re-evaluation of chronic medical conditions, medication management and review of any available recent lab and radiology data.  Preventive health is updated, specifically  Cancer screening and Immunization.   Questions or concerns regarding consultations or procedures which the PT has had in the interim are  addressed. The PT denies any adverse reactions to current medications since the last visit.  Still experiencing headaches. Has a quit date of feb 7.    Review of Systems See HPI Denies recent fever or chills. Denies sinus pressure, nasal congestion, ear pain or sore throat. Denies chest congestion, productive cough or wheezing. Denies chest pains, palpitations and leg swelling Denies , vomiting,diarrhea or constipation.  Chronic abdominal pain and nausea if on no reflux medication Denies dysuria, frequency, hesitancy or incontinence. Denies joint pain, swelling and limitation in mobility. Denies seizures, numbness, or tingling. Denies depression, c/o uncontrolled and increased stress ,Denies skin break down or rash.        Objective:   Physical Exam  Patient alert and oriented and in no cardiopulmonary distress.  HEENT: No facial asymmetry, EOMI, no sinus tenderness,  oropharynx pink and moist.  Neck supple no adenopathy.Fundoscopy normal  Chest: Clear to auscultation bilaterally.  CVS: S1, S2 no murmurs, no S3.  ABD: Soft non tender. Bowel sounds normal.  Ext: No edema  MS: Adequate ROM spine, shoulders, hips and knees.  Skin: Intact, no ulcerations or rash noted.  Psych: Good eye contact, normal affect. Memory intact not anxious or depressed appearing.  CNS: CN 2-12 intact, power, tone and sensation normal throughout.       Assessment & Plan:  T

## 2011-10-21 ENCOUNTER — Telehealth: Payer: Self-pay

## 2011-10-21 NOTE — Telephone Encounter (Signed)
pls pa nexium

## 2011-10-21 NOTE — Telephone Encounter (Signed)
Wanting to know the status of the PA on his Nexium. We were going to wait until he came in for appt to address. Covered alts are prilosec and omeprazole which he states he has tried. Ok to [place]?

## 2011-10-24 NOTE — Telephone Encounter (Signed)
PA in Dr box to be completed

## 2011-10-25 ENCOUNTER — Encounter: Payer: Self-pay | Admitting: Family Medicine

## 2011-10-25 NOTE — Assessment & Plan Note (Signed)
Needs to be maintained on chronic PPI

## 2011-10-25 NOTE — Assessment & Plan Note (Signed)
Uncontrolled,normal neurologic exam and imaging studies. Neurology has been recommended multiple times in the past

## 2011-11-10 ENCOUNTER — Other Ambulatory Visit: Payer: Self-pay | Admitting: Family Medicine

## 2012-01-11 ENCOUNTER — Other Ambulatory Visit: Payer: Self-pay | Admitting: Family Medicine

## 2012-01-12 ENCOUNTER — Telehealth: Payer: Self-pay | Admitting: Family Medicine

## 2012-01-12 NOTE — Telephone Encounter (Signed)
Needs a note from [place] for job stating that he has medical issues stating that he needs to wear safety glasses for his headaches with tint to keep a lot of light from getting in his eyes for his headaches and migraines. They will not let him wear them without a note

## 2012-01-13 NOTE — Telephone Encounter (Signed)
pls type note stating pt suffers from headaches and reports that light makes them worse , sioo ion medical grounds I request he be allowed to wear tinted glasses on the job to reduce frequency and severity of headaxches. I will sign

## 2012-01-13 NOTE — Telephone Encounter (Signed)
Letter typed

## 2012-01-17 ENCOUNTER — Other Ambulatory Visit: Payer: Self-pay | Admitting: Family Medicine

## 2012-01-18 ENCOUNTER — Telehealth: Payer: Self-pay | Admitting: Family Medicine

## 2012-01-18 MED ORDER — HYDROCODONE-ACETAMINOPHEN 10-660 MG PO TABS: ORAL_TABLET | ORAL | Status: DC

## 2012-01-18 NOTE — Telephone Encounter (Signed)
Will send in after Dr signs

## 2012-02-02 ENCOUNTER — Emergency Department (HOSPITAL_COMMUNITY)
Admission: EM | Admit: 2012-02-02 | Discharge: 2012-02-02 | Disposition: A | Discharge status: Home / Self Care | Payer: Managed Care, Other (non HMO) | Attending: Emergency Medicine | Admitting: Emergency Medicine

## 2012-02-02 ENCOUNTER — Encounter (HOSPITAL_COMMUNITY): Payer: Self-pay | Admitting: *Deleted

## 2012-02-02 DIAGNOSIS — F17200 Nicotine dependence, unspecified, uncomplicated: Secondary | ICD-10-CM | POA: Insufficient documentation

## 2012-02-02 DIAGNOSIS — M549 Dorsalgia, unspecified: Secondary | ICD-10-CM | POA: Insufficient documentation

## 2012-02-02 DIAGNOSIS — G8929 Other chronic pain: Secondary | ICD-10-CM | POA: Insufficient documentation

## 2012-02-02 DIAGNOSIS — R51 Headache: Secondary | ICD-10-CM | POA: Insufficient documentation

## 2012-02-02 DIAGNOSIS — H539 Unspecified visual disturbance: Secondary | ICD-10-CM | POA: Insufficient documentation

## 2012-02-02 DIAGNOSIS — I10 Essential (primary) hypertension: Secondary | ICD-10-CM | POA: Insufficient documentation

## 2012-02-02 MED ORDER — DEXAMETHASONE SODIUM PHOSPHATE 4 MG/ML IJ SOLN
10.0000 mg | Freq: Once | INTRAMUSCULAR | Status: AC
  Administered 2012-02-02: 10 mg via INTRAVENOUS
  Filled 2012-02-02: qty 3

## 2012-02-02 MED ORDER — METOCLOPRAMIDE HCL 5 MG/ML IJ SOLN
10.0000 mg | Freq: Once | INTRAMUSCULAR | Status: AC
  Administered 2012-02-02: 10 mg via INTRAVENOUS
  Filled 2012-02-02: qty 2

## 2012-02-02 MED ORDER — DIPHENHYDRAMINE HCL 50 MG/ML IJ SOLN
50.0000 mg | Freq: Once | INTRAMUSCULAR | Status: AC
  Administered 2012-02-02: 50 mg via INTRAVENOUS
  Filled 2012-02-02: qty 1

## 2012-02-02 NOTE — ED Notes (Signed)
Barcode on Benadryl smeared, unreadable.

## 2012-02-02 NOTE — ED Notes (Signed)
Migraine ha since yesterday with n/v, light sensitivity.

## 2012-02-02 NOTE — ED Notes (Signed)
Advised not to drive

## 2012-02-02 NOTE — Discharge Instructions (Signed)

## 2012-02-02 NOTE — ED Provider Notes (Signed)
History  This chart was scribed for Sharyon Cable, MD by Valentina Shaggy. This patient was seen in room APA16A/APA16A and the patient's care was started at 2:38PM.  CSN: [id number]  Arrival date & time 02/02/12  1345   First MD Initiated Contact with Patient 02/02/12 1435      Chief Complaint  Patient presents with  . Migraine     Patient is a 41 y.o. male presenting with migraine. The history is provided by the patient. No language interpreter was used.  Migraine This is a recurrent problem. The current episode started 12 to 24 hours ago. The problem occurs rarely (Pt states that he used to come to the ED every other month for migranes but has become less frequent  wiht medication use.). The problem has been gradually worsening. Associated symptoms include headaches. Pertinent negatives include no abdominal pain and no shortness of breath. Exacerbated by: laying down and getting up. The symptoms are relieved by nothing. He has tried rest for the symptoms. The treatment provided no (made symptoms worse) relief.  no focal weakness No rash/tick bites reported No head trauma Similar to prior headaches Onset was gradual  Past Medical History  Diagnosis Date  . Chronic back pain   . Abdominal tenderness   . Unspecified essential hypertension   . Headache   . Obesity, unspecified   . Headache   . Essential hypertension, benign   . Duodenitis with bleeding 08/2008    with ulcer / admitted to [facility]   . GERD (gastroesophageal reflux disease)     Past Surgical History  Procedure Date  . Foot fracture surgery     Family History  Problem Relation Age of Onset  . Diabetes Mother   . Hypertension Mother   . Hypertension Father     History  Substance Use Topics  . Smoking status: Current Everyday Smoker -- 0.5 packs/day    Types: Cigarettes  . Smokeless tobacco: Not on file  . Alcohol Use: Yes      Review of Systems  Constitutional: Negative for fever.   HENT: Negative for neck pain and neck stiffness.   Eyes: Positive for visual disturbance.  Respiratory: Negative for shortness of breath.   Gastrointestinal: Negative for abdominal pain.  Skin: Negative for rash.  Neurological: Positive for headaches.  All other systems reviewed and are negative.    Allergies  Bee venom; Prochlorperazine edisylate; Cyclobenzaprine hcl; and Carbamazepine  Home Medications   Current Outpatient Rx  Name Route Sig Dispense Refill  . AZOR 10-40 MG PO TABS  TAKE 1 TABLET BY MOUTH EVERY DAY 30 tablet 3  . ESOMEPRAZOLE MAGNESIUM 40 MG PO CPDR Oral Take 1 capsule (40 mg total) by mouth 2 (two) times daily. 60 capsule 1  . HYDROCODONE-ACETAMINOPHEN 10-660 MG PO TABS  TAKE 1 TABLET BY MOUTH TWICE DAILY AS NEEDED FOR SEVERE HEADACHE MAX OF 8 TABLETS PER WEEK 24 each 1    Triage Vitals: BP 138/102  Pulse 78  Temp(Src) 98.5 F (36.9 C) (Oral)  Resp 18  Ht 6' (1.829 m)  Wt 185 lb 4 oz (84.029 kg)  BMI 25.12 kg/m2  SpO2 100%  Physical Exam CONSTITUTIONAL: Well developed/well nourished HEAD AND FACE: Normocephalic/atraumatic EYES: EOMI/PERRL, no nystagnous  ENMT: Mucous membranes moist NECK: supple no meningeal signs, no bruits  SPINE:entire spine nontender CV: S1/S2 noted, no murmurs/rubs/gallops noted LUNGS: Lungs are clear to auscultation bilaterally, no apparent distress ABDOMEN: soft, nontender, no rebound or guarding GU:no cva tenderness  NEURO: Awake/alert, facies symmetric, no arm or leg drift is noted Cranial nerves 3/4/5/6/03/27/09/11/12 tested and intact Gait normal, no past pointing EXTREMITIES: pulses normal, full ROM SKIN: warm, color normal PSYCH: no abnormalities of mood noted   ED Course  Procedures  DIAGNOSTIC STUDIES: Oxygen Saturation is100% on room air, normal by my interpretation.    COORDINATION OF CARE:  2:44PM: discussed prescribing medication for HA with pt and pt agreed   Pt improved, stable for d/c Doubt acute  neurologic process at this time   The patient appears reasonably screened and/or stabilized for discharge and I doubt any other medical condition or other [facility] requiring further screening, evaluation, or treatment in the ED at this time prior to discharge.    MDM  Nursing notes reviewed and considered in documentation       I personally performed the services described in this documentation, which was scribed in my presence. The recorded information has been reviewed and considered.      Sharyon Cable, MD 02/02/12 [phone]

## 2012-02-15 ENCOUNTER — Ambulatory Visit (INDEPENDENT_AMBULATORY_CARE_PROVIDER_SITE_OTHER): Payer: Managed Care, Other (non HMO) | Admitting: Family Medicine

## 2012-02-15 ENCOUNTER — Encounter: Payer: Self-pay | Admitting: Family Medicine

## 2012-02-15 DIAGNOSIS — I10 Essential (primary) hypertension: Secondary | ICD-10-CM

## 2012-02-16 NOTE — Progress Notes (Signed)
  Subjective:    Patient ID: [name], male    DOB: 1971-01-14, 40 y.o.   MRN: [id number]  HPI Pt did not show for visit, needs to come in for eval   Review of Systems     Objective:   Physical Exam        Assessment & Plan:

## 2012-02-28 ENCOUNTER — Other Ambulatory Visit: Payer: Self-pay | Admitting: Family Medicine

## 2012-02-29 ENCOUNTER — Telehealth: Payer: Self-pay | Admitting: Family Medicine

## 2012-02-29 MED ORDER — HYDROCODONE-ACETAMINOPHEN 10-660 MG PO TABS: ORAL_TABLET | ORAL | Status: DC

## 2012-02-29 NOTE — Telephone Encounter (Signed)
Will make aware that no further fills until OV

## 2012-03-29 ENCOUNTER — Ambulatory Visit (INDEPENDENT_AMBULATORY_CARE_PROVIDER_SITE_OTHER): Payer: Managed Care, Other (non HMO) | Admitting: Family Medicine

## 2012-03-29 ENCOUNTER — Encounter: Payer: Self-pay | Admitting: Family Medicine

## 2012-03-29 ENCOUNTER — Telehealth: Payer: Self-pay | Admitting: Family Medicine

## 2012-03-29 ENCOUNTER — Other Ambulatory Visit: Payer: Self-pay

## 2012-03-29 VITALS — BP 142/96 | HR 60 | Resp 18 | Ht 72.5 in | Wt 183.1 lb

## 2012-03-29 DIAGNOSIS — R5383 Other fatigue: Secondary | ICD-10-CM

## 2012-03-29 DIAGNOSIS — F17200 Nicotine dependence, unspecified, uncomplicated: Secondary | ICD-10-CM

## 2012-03-29 DIAGNOSIS — R5381 Other malaise: Secondary | ICD-10-CM

## 2012-03-29 DIAGNOSIS — Z79899 Other long term (current) drug therapy: Secondary | ICD-10-CM

## 2012-03-29 DIAGNOSIS — Z139 Encounter for screening, unspecified: Secondary | ICD-10-CM

## 2012-03-29 DIAGNOSIS — Z125 Encounter for screening for malignant neoplasm of prostate: Secondary | ICD-10-CM

## 2012-03-29 DIAGNOSIS — R51 Headache: Secondary | ICD-10-CM

## 2012-03-29 DIAGNOSIS — K266 Chronic or unspecified duodenal ulcer with both hemorrhage and perforation: Secondary | ICD-10-CM

## 2012-03-29 DIAGNOSIS — I10 Essential (primary) hypertension: Secondary | ICD-10-CM

## 2012-03-29 LAB — CBC WITH DIFFERENTIAL/PLATELET
Basophils Absolute: 0 10*3/uL (ref 0.0–0.1)
Eosinophils Absolute: 0.1 10*3/uL (ref 0.0–0.7)
Eosinophils Relative: 3 % (ref 0–5)
HCT: 38.9 % — ABNORMAL LOW (ref 39.0–52.0)
Hemoglobin: 13.1 g/dL (ref 13.0–17.0)
MCHC: 33.7 g/dL (ref 30.0–36.0)
Platelets: 262 10*3/uL (ref 150–400)
RBC: 4.01 MIL/uL — ABNORMAL LOW (ref 4.22–5.81)
RDW: 12.1 % (ref 11.5–15.5)
WBC: 3.3 10*3/uL — ABNORMAL LOW (ref 4.0–10.5)

## 2012-03-29 LAB — COMPLETE METABOLIC PANEL WITH GFR
ALT: 10 U/L (ref 0–53)
AST: 15 U/L (ref 0–37)
CO2: 29 mEq/L (ref 19–32)
Creat: 1.77 mg/dL — ABNORMAL HIGH (ref 0.50–1.35)
GFR, Est African American: 53 mL/min — ABNORMAL LOW
Total Bilirubin: 1.1 mg/dL (ref 0.3–1.2)

## 2012-03-29 LAB — LIPID PANEL
HDL: 46 mg/dL (ref >39)
LDL Cholesterol: 52 mg/dL (ref 0–99)
Triglycerides: 46 mg/dL (ref <150)

## 2012-03-29 MED ORDER — TRIAMTERENE-HCTZ 37.5-25 MG PO TABS: 1.0000 | ORAL_TABLET | Freq: Every day | ORAL | Status: DC

## 2012-03-29 MED ORDER — HYDROCODONE-ACETAMINOPHEN 10-660 MG PO TABS: ORAL_TABLET | ORAL | Status: DC

## 2012-03-29 MED ORDER — ESOMEPRAZOLE MAGNESIUM 40 MG PO CPDR: 40.0000 mg | DELAYED_RELEASE_CAPSULE | Freq: Two times a day (BID) | ORAL | Status: DC

## 2012-03-29 NOTE — Patient Instructions (Addendum)
F/u in 6 weeks.  Your blood pressure is high, new additional medication , maxzide to be started today as well. It is very important to control your blood pressure, so that you do not have kidney damage.  You are referred to headache clinic about uncontrolled headaches.  You need to schedule an appointment with the eye specialist since you are having difficulty reading.  Labs today, cbc, chem 7 and eGFR, lipid, tsh , PSA and vit D  You NEED to stop smoking. This is increasing your blood pressure, and increasing your risk of all types of cancer, heart disease and stroke   Please think about quitting smoking.  This is very important for your health.  Consider setting a quit date, then cutting back or switching brands to prepare to stop.  Also think of the money you will save every day by not smoking.  Quick Tips to Quit Smoking: Fix a date i.e. keep a date in mind from when you would not touch a tobacco product to smoke  Keep yourself busy and block your mind with work loads or reading books or watching movies in malls where smoking is not allowed  Vanish off the things which reminds you about smoking for example match box, or your favorite lighter, or the pipe you used for smoking, or your favorite jeans and shirt with which you used to enjoy smoking, or the club where you used to do smoking  Try to avoid certain people places and incidences where and with whom smoking is a common factor to add on  Praise yourself with some token gifts from the money you saved by stopping smoking  Anti Smoking teams are there to help you. Join their programs  Anti-smoking Gums are there in many medical shops. Try them to quit smoking   Side-effects of Smoking: Disease caused by smoking cigarettes are emphysema, bronchitis, heart failures  Premature death  Cancer is the major side effect of smoking  Heart attacks and strokes are the quick effects of smoking causing sudden death  Some smokers lives end up  with limbs amputated  Breathing problem or fast breathing is another side effect of smoking  Due to more intakes of smokes, carbon mono-oxide goes into your brain and other muscles of the body which leads to swelling of the veins and blockage to the air passage to lungs  Carbon monoxide blocks blood vessels which leads to blockage in the flow of blood to different major body organs like heart lungs and thus leads to attacks and deaths  During pregnancy smoking is very harmful and leads to premature birth of the infant, spontaneous abortions, low weight of the infant during birth  Fat depositions to narrow and blocked blood vessels causing heart attacks  In many cases cigarette smoking caused infertility in men

## 2012-03-30 NOTE — Telephone Encounter (Signed)
He came to the office and his med was refilled as requested

## 2012-04-01 DIAGNOSIS — F17200 Nicotine dependence, unspecified, uncomplicated: Secondary | ICD-10-CM | POA: Insufficient documentation

## 2012-04-01 NOTE — Assessment & Plan Note (Signed)
Uncontrolled, add maxzide

## 2012-04-01 NOTE — Assessment & Plan Note (Signed)
Needs refill on PPI, has been out for several days

## 2012-04-01 NOTE — Assessment & Plan Note (Signed)
Unchanged, cessation counseling done through print

## 2012-04-01 NOTE — Progress Notes (Signed)
  Subjective:    Patient ID: [name], male    DOB: 1970-11-26, 41 y.o.   MRN: [id number]  HPI The PT is here for follow up and re-evaluation of chronic medical conditions, medication management and review of any available recent lab and radiology data.  Preventive health is updated, specifically  Cancer screening and Immunization.  Refuses TdAP though past due The PT denies any adverse reactions to current medications since the last visit.  C/o increased frequency of headaches, on average weekly, has been intolerant of prophylactic medication, states the headaches interfere with his ability to work at times. Also reports vision problems in recent times, will sched his own eye exam C/o bilateral ear pain seemingly related to headache with bitemporal throbbing       Review of Systems See HPI Denies recent fever or chills. Denies sinus pressure, nasal congestion, or sore throat. Denies chest congestion, productive cough or wheezing. Denies chest pains, palpitations and leg swelling C/o  abdominal pain, denies nausea, vomiting,diarrhea or constipation.  Needs med refill on his pPI Denies dysuria, frequency, hesitancy or incontinence. Denies joint pain, swelling and limitation in mobility. Denies depression, anxiety or insomnia. Denies skin break down or rash.        Objective:   Physical Exam Patient alert and oriented and in no cardiopulmonary distress.  HEENT: No facial asymmetry, EOMI, no sinus tenderness,  oropharynx pink and moist.  Neck supple no adenopathy.TM clear bilaterally  Chest: Clear to auscultation bilaterally.  CVS: S1, S2 no murmurs, no S3.  ABD: Soft non tender. Bowel sounds normal.  Ext: No edema  MS: Adequate ROM spine, shoulders, hips and knees.  Skin: Intact, no ulcerations or rash noted.  Psych: Good eye contact, normal affect. Memory intact not anxious or depressed appearing.  CNS: CN 2-12 intact, power, tone and sensation normal  throughout.        Assessment & Plan:

## 2012-04-01 NOTE — Assessment & Plan Note (Signed)
Uncontrolled, has headaches almost weekly which are disabling. Has been intolerant of prophylactic medication, needs to see neurology/headache clinic, he agrees

## 2012-04-03 ENCOUNTER — Telehealth: Payer: Self-pay | Admitting: Family Medicine

## 2012-04-04 NOTE — Telephone Encounter (Signed)
Phone not in working order.

## 2012-04-06 ENCOUNTER — Ambulatory Visit (INDEPENDENT_AMBULATORY_CARE_PROVIDER_SITE_OTHER): Payer: Managed Care, Other (non HMO) | Admitting: Family Medicine

## 2012-04-06 ENCOUNTER — Encounter: Payer: Self-pay | Admitting: Family Medicine

## 2012-04-06 ENCOUNTER — Telehealth: Payer: Self-pay | Admitting: Family Medicine

## 2012-04-06 VITALS — BP 114/78 | HR 90 | Resp 18 | Ht 72.5 in | Wt 178.0 lb

## 2012-04-06 DIAGNOSIS — N289 Disorder of kidney and ureter, unspecified: Secondary | ICD-10-CM

## 2012-04-06 DIAGNOSIS — R5381 Other malaise: Secondary | ICD-10-CM

## 2012-04-06 DIAGNOSIS — R51 Headache: Secondary | ICD-10-CM

## 2012-04-06 DIAGNOSIS — I10 Essential (primary) hypertension: Secondary | ICD-10-CM

## 2012-04-06 DIAGNOSIS — N189 Chronic kidney disease, unspecified: Secondary | ICD-10-CM

## 2012-04-06 DIAGNOSIS — R5383 Other fatigue: Secondary | ICD-10-CM

## 2012-04-06 NOTE — Patient Instructions (Signed)
Continue your current medications Ultrasound to be set up Out of work, return on Monday  Keep previous appt with Dr. Moshe Cipro

## 2012-04-06 NOTE — Progress Notes (Signed)
  Subjective:    Patient ID: [name], male    DOB: [birth year], 41 y.o.   MRN: [id number]  HPI Patient presents with fatigue palpitations and not feeling well for the past 24 hours. He was at work and the conditions were very hot as he works at a [employer] and he felt faint and had headache therefore was told that he needed to seek medical attention. He had labs performed by his primary care provider one week ago after his routine visit for for hypertension he was noted to have elevated creatinine , this was also noted labs from last year  He was restarted on Maxzide which he had been on in the past. He's currently taking clonidine, Azor and Maxzide. He tells me he has headaches on and off, he has been referred to neurology by his PCP.   Review of Systems  GEN- + fatigue, fever, weight loss,weakness, recent illness HEENT- denies eye drainage, change in vision, nasal discharge, CVS- denies chest pain, +palpitations RESP- denies SOB, cough, wheeze ABD- denies N/V, change in stools, abd pain GU- denies dysuria, hematuria, dribbling, incontinence MSK- denies joint pain, muscle aches, injury Neuro- + headache, dizziness, syncope, seizure activity      Objective:   Physical Exam GEN- NAD, alert and oriented x3, well appearing HEENT- PERRL, EOMI, non injected sclera, pink conjunctiva, MMM, oropharynx clear Neck- Supple, no thryomegaly CVS- RRR, no murmur RESP-CTAB EXT- No edema Pulses- Radial, DP- 2+ Neuro- CNII-XII in tact, no focal deficits  EKG-NSR, no ST changes      Assessment & Plan:

## 2012-04-08 ENCOUNTER — Encounter: Payer: Self-pay | Admitting: Family Medicine

## 2012-04-08 DIAGNOSIS — N289 Disorder of kidney and ureter, unspecified: Secondary | ICD-10-CM | POA: Insufficient documentation

## 2012-04-08 NOTE — Assessment & Plan Note (Signed)
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  Advised him to keep up on fluid intake while at work, he tells me he does not drink enough because he is busy although he has been instructed to do so. This past week he may have been feeling effects of new BP medication as well. He was given 2 days off, he may return to work as scheduled on Monday

## 2012-04-08 NOTE — Assessment & Plan Note (Signed)
Pt has renal insufficiency, looking back to labs from last year, also noted, he has been scheduled for renal ultrasound for this upcoming week. Will defer to PCP further management.

## 2012-04-08 NOTE — Assessment & Plan Note (Signed)
BP much improved, he was on maxide in the past per report.

## 2012-04-08 NOTE — Assessment & Plan Note (Signed)
He is on hydrocodone as needed, neuro appt pending

## 2012-04-09 ENCOUNTER — Ambulatory Visit (HOSPITAL_COMMUNITY)
Admission: RE | Admit: 2012-04-09 | Discharge: 2012-04-09 | Disposition: A | Discharge status: Home / Self Care | Payer: Managed Care, Other (non HMO) | Source: Ambulatory Visit | Attending: Family Medicine | Admitting: Family Medicine

## 2012-04-09 DIAGNOSIS — I10 Essential (primary) hypertension: Secondary | ICD-10-CM | POA: Insufficient documentation

## 2012-04-09 DIAGNOSIS — N189 Chronic kidney disease, unspecified: Secondary | ICD-10-CM

## 2012-04-09 DIAGNOSIS — N281 Cyst of kidney, acquired: Secondary | ICD-10-CM | POA: Insufficient documentation

## 2012-04-11 ENCOUNTER — Telehealth: Payer: Self-pay | Admitting: Family Medicine

## 2012-04-11 NOTE — Telephone Encounter (Signed)
I already sent a message to Dr regarding results. Will call when available

## 2012-04-11 NOTE — Telephone Encounter (Signed)
Patient is aware 

## 2012-04-12 ENCOUNTER — Telehealth: Payer: Self-pay | Admitting: Family Medicine

## 2012-04-12 NOTE — Telephone Encounter (Signed)
Please let pt know he has cysts on both kidneys, one appears top be complex, 6 month rept test is recommended. I recommend he have the urologist follow this, let me know if he agrees so I can refer

## 2012-04-13 NOTE — Telephone Encounter (Signed)
Called patient and left message for them to return call at the office   

## 2012-04-16 ENCOUNTER — Other Ambulatory Visit: Payer: Self-pay | Admitting: Family Medicine

## 2012-04-16 DIAGNOSIS — N281 Cyst of kidney, acquired: Secondary | ICD-10-CM

## 2012-04-16 NOTE — Telephone Encounter (Signed)
pls see referral entered for urology and put in

## 2012-04-16 NOTE — Telephone Encounter (Signed)
Pt is aware of results and agrees with the referral.

## 2012-04-16 NOTE — Telephone Encounter (Signed)
pls refer to [facility], eval renal cyst

## 2012-04-17 NOTE — Telephone Encounter (Signed)
Pt was referred to dr. Mar Daring on 06/08/2012. Pt is aware

## 2012-04-19 ENCOUNTER — Encounter: Payer: Self-pay | Admitting: Family Medicine

## 2012-04-19 ENCOUNTER — Telehealth: Payer: Self-pay | Admitting: Family Medicine

## 2012-04-19 ENCOUNTER — Other Ambulatory Visit: Payer: Self-pay | Admitting: Family Medicine

## 2012-04-19 MED ORDER — HYDROCODONE-ACETAMINOPHEN 10-660 MG PO TABS: ORAL_TABLET | ORAL | Status: DC

## 2012-04-19 NOTE — Telephone Encounter (Signed)
pls let him know script sent, but he NEEDS to go to the headache clinic since he has frequent disabling headaches

## 2012-04-19 NOTE — Telephone Encounter (Signed)
Noted and called pt and left voicemail notifying of rx being faxed.

## 2012-04-19 NOTE — Telephone Encounter (Signed)
Are we still prescribing his vicodin?

## 2012-05-08 ENCOUNTER — Other Ambulatory Visit: Payer: Self-pay | Admitting: Family Medicine

## 2012-05-10 ENCOUNTER — Ambulatory Visit: Payer: Managed Care, Other (non HMO) | Admitting: Family Medicine

## 2012-05-16 ENCOUNTER — Other Ambulatory Visit: Payer: Self-pay | Admitting: Family Medicine

## 2012-05-23 ENCOUNTER — Ambulatory Visit: Payer: Managed Care, Other (non HMO) | Admitting: Family Medicine

## 2012-05-31 ENCOUNTER — Encounter: Payer: Self-pay | Admitting: Family Medicine

## 2012-05-31 ENCOUNTER — Ambulatory Visit: Payer: Managed Care, Other (non HMO) | Admitting: Family Medicine

## 2012-06-04 ENCOUNTER — Encounter: Payer: Self-pay | Admitting: Family Medicine

## 2012-06-04 ENCOUNTER — Ambulatory Visit (INDEPENDENT_AMBULATORY_CARE_PROVIDER_SITE_OTHER): Payer: Managed Care, Other (non HMO) | Admitting: Family Medicine

## 2012-06-04 VITALS — BP 150/90 | HR 64 | Resp 18 | Ht 72.5 in | Wt 193.0 lb

## 2012-06-04 DIAGNOSIS — R51 Headache: Secondary | ICD-10-CM

## 2012-06-04 DIAGNOSIS — F17200 Nicotine dependence, unspecified, uncomplicated: Secondary | ICD-10-CM

## 2012-06-04 DIAGNOSIS — I10 Essential (primary) hypertension: Secondary | ICD-10-CM

## 2012-06-04 MED ORDER — TRIAMTERENE-HCTZ 75-50 MG PO TABS: 1.0000 | ORAL_TABLET | Freq: Every day | ORAL | Status: DC

## 2012-06-04 NOTE — Patient Instructions (Addendum)
F/u in December.  Keep appt with dr Vernie Shanks on 09/20, please identify location  Blood pressure is still high, increase triamterene to TWO  25mg  tablets once  Daily till done. New med is triamterene 50mg  ONE daily , which is blue Kidney is damaged from high blood pressure.  CONGRATS on stopping smoking 3 weeks ago. Keep this up  You need to exercise at least 45 minutes every day for health  Weight loss goal of 8 pounds

## 2012-06-10 NOTE — Assessment & Plan Note (Signed)
Improved buit still uncontrolled, additional dose increase in med DASH diet and commitment to daily physical activity for a minimum of 30 minutes discussed and encouraged, as a part of hypertension management. The importance of attaining a healthy weight is also discussed.

## 2012-06-10 NOTE — Progress Notes (Signed)
  Subjective:    Patient ID: [name], male    DOB: 1971-07-20, 41 y.o.   MRN: [id number]  HPI The PT is here for follow up and re-evaluation of chronic medical conditions, medication management and review of any available recent lab and radiology data.  Preventive health is updated, specifically  Cancer screening and Immunization.   Questions or concerns regarding consultations or procedures which the PT has had in the interim are  addressed. The PT denies any adverse reactions to current medications since the last visit.  There are no new concerns.  There are no specific complaints       Review of Systems See HPI Denies recent fever or chills. Denies sinus pressure, nasal congestion, ear pain or sore throat. Denies chest congestion, productive cough or wheezing. Denies chest pains, palpitations and leg swelling Denies abdominal pain, nausea, vomiting,diarrhea or constipation.   Denies dysuria, frequency, hesitancy or incontinence. Denies joint pain, swelling and limitation in mobility. Denies headaches, seizures, numbness, or tingling. Denies depression, anxiety or insomnia. Denies skin break down or rash.        Objective:   Physical Exam  Patient alert and oriented and in no cardiopulmonary distress.  HEENT: No facial asymmetry, EOMI, no sinus tenderness,  oropharynx pink and moist.  Neck supple no adenopathy.  Chest: Clear to auscultation bilaterally.  CVS: S1, S2 no murmurs, no S3.  ABD: Soft non tender. Bowel sounds normal.  Ext: No edema  MS: Adequate ROM spine, shoulders, hips and knees.  Skin: Intact, no ulcerations or rash noted.  Psych: Good eye contact, normal affect. Memory intact not anxious or depressed appearing.  CNS: CN 2-12 intact, power, tone and sensation normal throughout.       Assessment & Plan:

## 2012-06-10 NOTE — Assessment & Plan Note (Signed)
sttaes they have improved, did not keep appt with neurologist and does not appear to intend to do so.I reminded pt that he has often come in asking for work excuses based on headache, and without getting help from a neurologist this is unacceptable

## 2012-07-10 ENCOUNTER — Encounter (HOSPITAL_COMMUNITY): Payer: Self-pay

## 2012-07-10 ENCOUNTER — Emergency Department (HOSPITAL_COMMUNITY)
Admission: EM | Admit: 2012-07-10 | Discharge: 2012-07-10 | Disposition: A | Discharge status: Home / Self Care | Payer: Managed Care, Other (non HMO) | Attending: Emergency Medicine | Admitting: Emergency Medicine

## 2012-07-10 ENCOUNTER — Emergency Department (HOSPITAL_COMMUNITY): Payer: Managed Care, Other (non HMO)

## 2012-07-10 DIAGNOSIS — Y9389 Activity, other specified: Secondary | ICD-10-CM | POA: Insufficient documentation

## 2012-07-10 DIAGNOSIS — E669 Obesity, unspecified: Secondary | ICD-10-CM | POA: Insufficient documentation

## 2012-07-10 DIAGNOSIS — S6000XA Contusion of unspecified finger without damage to nail, initial encounter: Secondary | ICD-10-CM | POA: Insufficient documentation

## 2012-07-10 DIAGNOSIS — M549 Dorsalgia, unspecified: Secondary | ICD-10-CM | POA: Insufficient documentation

## 2012-07-10 DIAGNOSIS — S60019A Contusion of unspecified thumb without damage to nail, initial encounter: Secondary | ICD-10-CM

## 2012-07-10 DIAGNOSIS — Z87891 Personal history of nicotine dependence: Secondary | ICD-10-CM | POA: Insufficient documentation

## 2012-07-10 DIAGNOSIS — Z79899 Other long term (current) drug therapy: Secondary | ICD-10-CM | POA: Insufficient documentation

## 2012-07-10 DIAGNOSIS — Y929 Unspecified place or not applicable: Secondary | ICD-10-CM | POA: Insufficient documentation

## 2012-07-10 DIAGNOSIS — Z8669 Personal history of other diseases of the nervous system and sense organs: Secondary | ICD-10-CM | POA: Insufficient documentation

## 2012-07-10 DIAGNOSIS — I10 Essential (primary) hypertension: Secondary | ICD-10-CM | POA: Insufficient documentation

## 2012-07-10 DIAGNOSIS — Z8719 Personal history of other diseases of the digestive system: Secondary | ICD-10-CM | POA: Insufficient documentation

## 2012-07-10 DIAGNOSIS — W208XXA Other cause of strike by thrown, projected or falling object, initial encounter: Secondary | ICD-10-CM | POA: Insufficient documentation

## 2012-07-10 MED ORDER — HYDROCODONE-ACETAMINOPHEN 5-325 MG PO TABS: ORAL_TABLET | ORAL | Status: DC

## 2012-07-10 NOTE — ED Provider Notes (Signed)
History     CSN: [id number]  Arrival date & time 07/10/12  1433   First MD Initiated Contact with Patient 07/10/12 1613      Chief Complaint  Patient presents with  . Hand Pain    (Consider location/radiation/quality/duration/timing/severity/associated sxs/prior treatment) HPI Comments: Patient c/o pain to the base of the left thumb that occurred when he accidentally dropped a heavy keyboard on his hand.  States the area was swollen, but he has been applying ice packs and the swelling has gone down today.  Pain with movement and flexion of the thumb.  He denies numbness, weakness or wrist pain.  Patient is a 41 y.o. male presenting with hand pain. The history is provided by the patient.  Hand Pain This is a new problem. The current episode started yesterday. The problem occurs constantly. The problem has been unchanged. Associated symptoms include arthralgias. Pertinent negatives include no chills, fever, headaches, joint swelling, numbness, swollen glands, vomiting or weakness. The symptoms are aggravated by bending (movement and palpation). He has tried ice for the symptoms. The treatment provided mild relief.    Past Medical History  Diagnosis Date  . Chronic back pain   . Abdominal tenderness   . Unspecified essential hypertension   . Headache   . Obesity, unspecified   . Headache   . Essential hypertension, benign   . Duodenitis with bleeding 08/2008    with ulcer / admitted to [facility]   . GERD (gastroesophageal reflux disease)     Past Surgical History  Procedure Date  . Foot fracture surgery     Family History  Problem Relation Age of Onset  . Diabetes Mother   . Hypertension Mother   . Hypertension Father     History  Substance Use Topics  . Smoking status: Former Smoker -- 0.5 packs/day    Types: Cigarettes    Quit date: 05/28/2012  . Smokeless tobacco: Not on file  . Alcohol Use: No      Review of Systems  Constitutional: Negative for  fever and chills.  Gastrointestinal: Negative for vomiting.  Genitourinary: Negative for dysuria and difficulty urinating.  Musculoskeletal: Positive for arthralgias. Negative for joint swelling.  Skin: Negative for color change and wound.  Neurological: Negative for dizziness, weakness, numbness and headaches.  All other systems reviewed and are negative.    Allergies  Bee venom; Prochlorperazine edisylate; Cyclobenzaprine hcl; Aleve; Carbamazepine; Ibuprofen; Tylenol; and Other  Home Medications   Current Outpatient Rx  Name Route Sig Dispense Refill  . AMLODIPINE-OLMESARTAN 10-40 MG PO TABS Oral Take 1 tablet by mouth daily.    Marland Kitchen CLONIDINE HCL 0.3 MG PO TABS Oral Take 0.3 mg by mouth at bedtime.    Marland Kitchen ESOMEPRAZOLE MAGNESIUM 40 MG PO CPDR Oral Take 1 capsule (40 mg total) by mouth 2 (two) times daily. 60 capsule 3  . HYDROCODONE-ACETAMINOPHEN 10-660 MG PO TABS Oral Take 1-2 tablets by mouth 2 (two) times daily as needed. One tablet twice daily, as needed for severe headache. Maximum of 8 tablets per week.    . TRIAMTERENE-HCTZ 75-50 MG PO TABS Oral Take 1 tablet by mouth daily.      BP 142/101  Pulse 65  Temp 98.2 F (36.8 C) (Oral)  Resp 18  Ht 6' (1.829 m)  Wt 195 lb (88.451 kg)  BMI 26.45 kg/m2  SpO2 100%  Physical Exam  Nursing note and vitals reviewed. Constitutional: He is oriented to person, place, and time. He appears well-developed and  well-nourished. No distress.  HENT:  Head: Normocephalic and atraumatic.  Neck: Normal range of motion. Neck supple.  Cardiovascular: Normal rate, regular rhythm and normal heart sounds.   Pulmonary/Chest: Effort normal and breath sounds normal.  Musculoskeletal: He exhibits tenderness. He exhibits no edema.       Left hand: He exhibits decreased range of motion and tenderness. He exhibits no bony tenderness, normal two-point discrimination, normal capillary refill, no deformity, no laceration and no swelling. normal sensation  noted. Normal strength noted.       Hands:      ttp of the base of the left thumb.  Pain is reproduced with flexion of the thumb.  Radial pulse is brisk, distal sensation intact, CR< 2 sec.  No tenderness to the anatomical snuffbox  Neurological: He is alert and oriented to person, place, and time. He exhibits normal muscle tone. Coordination normal.  Skin: Skin is warm and dry.    ED Course  Procedures (including critical care time)  Labs Reviewed - No data to display Dg Hand Complete Left  07/10/2012  *RADIOLOGY REPORT*  Clinical Data: Closed hand and trunk of car  LEFT HAND - COMPLETE 3+ VIEW  Comparison: None.  Findings: No acute fracture is seen.  The radiocarpal joint space appears normal.  The carpal bones are in normal position.  MCP, PIP, DIP joints appear normal although there is some soft tissue swelling over the PIP joints of the second, third, possibly fourth digits.  IMPRESSION: No acute fracture.   Original Report Authenticated By: Joretta Bachelor, M.D.      Thumb spice splint applied, pain improved, remains NV intact.     MDM     Patient agrees to RICE therapy and close orthopedic f/u if the sx's are not improving.  Prescribed: Norco #[id number]       Luay Balding L. [place], [place] 07/10/12 1651

## 2012-07-10 NOTE — ED Notes (Signed)
Pt reports dropped a keyboard on his left hand.  C/O pain at base of left thumb.

## 2012-07-13 NOTE — ED Provider Notes (Signed)
Medical screening examination/treatment/procedure(s) were performed by non-physician practitioner and as supervising physician I was immediately available for consultation/collaboration.   Maudry Diego, MD 07/13/12 [phone]

## 2012-07-16 ENCOUNTER — Other Ambulatory Visit: Payer: Self-pay | Admitting: Family Medicine

## 2012-07-20 ENCOUNTER — Telehealth: Payer: Self-pay | Admitting: Family Medicine

## 2012-07-20 NOTE — Telephone Encounter (Signed)
pls send in script and let him know

## 2012-07-20 NOTE — Telephone Encounter (Signed)
Med refilled.

## 2012-08-05 ENCOUNTER — Other Ambulatory Visit: Payer: Self-pay | Admitting: Family Medicine

## 2012-09-06 ENCOUNTER — Other Ambulatory Visit: Payer: Self-pay | Admitting: Family Medicine

## 2012-09-20 ENCOUNTER — Other Ambulatory Visit: Payer: Self-pay | Admitting: Family Medicine

## 2012-09-21 ENCOUNTER — Other Ambulatory Visit: Payer: Self-pay

## 2012-09-21 MED ORDER — HYDROCODONE-ACETAMINOPHEN 10-325 MG PO TABS: 1.0000 | ORAL_TABLET | Freq: Two times a day (BID) | ORAL | Status: DC | PRN

## 2012-09-28 ENCOUNTER — Other Ambulatory Visit: Payer: Self-pay | Admitting: Family Medicine

## 2012-10-01 ENCOUNTER — Telehealth: Payer: Self-pay | Admitting: Family Medicine

## 2012-10-02 MED ORDER — AMLODIPINE-OLMESARTAN 10-40 MG PO TABS: 1.0000 | ORAL_TABLET | Freq: Every day | ORAL | Status: DC

## 2012-10-02 MED ORDER — CLONIDINE HCL 0.3 MG PO TABS: ORAL_TABLET | ORAL | Status: DC

## 2012-10-02 NOTE — Telephone Encounter (Signed)
Wanted meds sent to [facility]

## 2012-10-09 ENCOUNTER — Encounter (HOSPITAL_COMMUNITY): Payer: Self-pay

## 2012-10-22 ENCOUNTER — Encounter: Payer: Managed Care, Other (non HMO) | Admitting: Family Medicine

## 2012-10-30 ENCOUNTER — Ambulatory Visit (INDEPENDENT_AMBULATORY_CARE_PROVIDER_SITE_OTHER): Payer: Managed Care, Other (non HMO) | Admitting: Family Medicine

## 2012-10-30 VITALS — BP 144/94 | HR 94 | Resp 16 | Ht 72.5 in | Wt 191.0 lb

## 2012-10-30 DIAGNOSIS — I10 Essential (primary) hypertension: Secondary | ICD-10-CM

## 2012-10-30 DIAGNOSIS — R51 Headache: Secondary | ICD-10-CM

## 2012-10-30 MED ORDER — TRIAMTERENE-HCTZ 37.5-25 MG PO TABS: 1.0000 | ORAL_TABLET | Freq: Every day | ORAL | Status: DC

## 2012-10-30 NOTE — Patient Instructions (Addendum)
F/u with rectal exam in 4 month, call if you need me before  Blood pressure is higher than it should be, please take the TWO blood pressure meds listed , as prescribed

## 2012-10-31 ENCOUNTER — Encounter: Payer: Self-pay | Admitting: Family Medicine

## 2012-10-31 NOTE — Assessment & Plan Note (Signed)
Continues to have intermittent headaches, states stress related, occuring on the job, no interest, and has repeatedly not followed through on referrals to neurology made several times

## 2012-10-31 NOTE — Assessment & Plan Note (Signed)
Uncontrolled due to non compliance. Pt counseled re risk of renal failure, heart failure and stroke needs to comply. Med added, which he should have already been taking. DASH diet and commitment to daily physical activity for a minimum of 30 minutes discussed and encouraged, as a part of hypertension management. The importance of attaining a healthy weight is also discussed.

## 2012-10-31 NOTE — Progress Notes (Signed)
  Subjective:    Patient ID: [name], male    DOB: 08/23/1971, 42 y.o.   MRN: [id number]  HPI The PT is here for follow up and re-evaluation of chronic medical conditions, medication management and review of any available recent lab and radiology data. Has a form from his insurance company to complete documenting visit anfd some basic data Preventive health is updated, specifically  Cancer screening and Immunization.  Refuses immunization and is defering rectal exam till "next visit"  The PT denies any adverse reactions to current medications since the last visit. emains non compliant unfortunately with his meds Still experiences headaches on a regular basis, esspescialy when he "gets upset" which is often at work , reportedly       Review of Systems See HPI Denies recent fever or chills. Denies sinus pressure, nasal congestion, ear pain or sore throat. Denies chest congestion, productive cough or wheezing. Denies chest pains, palpitations and leg swelling Denies abdominal pain, nausea, vomiting,diarrhea or constipation.   Denies dysuria, frequency, hesitancy or incontinence. Intermittent back pain Denies , seizures, numbness, or tingling. Denies depression, anxiety or insomnia. Denies skin break down or rash.        Objective:   Physical Exam  Patient alert and oriented and in no cardiopulmonary distress.  HEENT: No facial asymmetry, EOMI, no sinus tenderness,  oropharynx pink and moist.  Neck supple no adenopathy.  Chest: Clear to auscultation bilaterally.  CVS: S1, S2 no murmurs, no S3.  ABD: Soft non tender. Bowel sounds normal.  Ext: No edema  MS: Adequate ROM spine, shoulders, hips and knees.  Skin: Intact, no ulcerations or rash noted.  Psych: Good eye contact, normal affect. Memory intact not anxious or depressed appearing.  CNS: CN 2-12 intact, power, tone and sensation normal throughout.       Assessment & Plan:

## 2012-11-19 ENCOUNTER — Other Ambulatory Visit: Payer: Self-pay | Admitting: Family Medicine

## 2012-12-06 ENCOUNTER — Emergency Department (HOSPITAL_COMMUNITY)
Admission: EM | Admit: 2012-12-06 | Discharge: 2012-12-06 | Disposition: A | Discharge status: Home / Self Care | Payer: Managed Care, Other (non HMO) | Attending: Emergency Medicine | Admitting: Emergency Medicine

## 2012-12-06 ENCOUNTER — Encounter (HOSPITAL_COMMUNITY): Payer: Self-pay | Admitting: *Deleted

## 2012-12-06 DIAGNOSIS — Z8639 Personal history of other endocrine, nutritional and metabolic disease: Secondary | ICD-10-CM | POA: Insufficient documentation

## 2012-12-06 DIAGNOSIS — E669 Obesity, unspecified: Secondary | ICD-10-CM | POA: Insufficient documentation

## 2012-12-06 DIAGNOSIS — Z79899 Other long term (current) drug therapy: Secondary | ICD-10-CM | POA: Insufficient documentation

## 2012-12-06 DIAGNOSIS — G8929 Other chronic pain: Secondary | ICD-10-CM | POA: Insufficient documentation

## 2012-12-06 DIAGNOSIS — E119 Type 2 diabetes mellitus without complications: Secondary | ICD-10-CM | POA: Insufficient documentation

## 2012-12-06 DIAGNOSIS — Z8719 Personal history of other diseases of the digestive system: Secondary | ICD-10-CM | POA: Insufficient documentation

## 2012-12-06 DIAGNOSIS — I10 Essential (primary) hypertension: Secondary | ICD-10-CM | POA: Insufficient documentation

## 2012-12-06 DIAGNOSIS — H53149 Visual discomfort, unspecified: Secondary | ICD-10-CM | POA: Insufficient documentation

## 2012-12-06 DIAGNOSIS — Z862 Personal history of diseases of the blood and blood-forming organs and certain disorders involving the immune mechanism: Secondary | ICD-10-CM | POA: Insufficient documentation

## 2012-12-06 DIAGNOSIS — Z87891 Personal history of nicotine dependence: Secondary | ICD-10-CM | POA: Insufficient documentation

## 2012-12-06 DIAGNOSIS — Z87448 Personal history of other diseases of urinary system: Secondary | ICD-10-CM | POA: Insufficient documentation

## 2012-12-06 DIAGNOSIS — R51 Headache: Secondary | ICD-10-CM | POA: Insufficient documentation

## 2012-12-06 DIAGNOSIS — G43909 Migraine, unspecified, not intractable, without status migrainosus: Secondary | ICD-10-CM

## 2012-12-06 MED ORDER — SODIUM CHLORIDE 0.9 % IV BOLUS (SEPSIS)
1000.0000 mL | Freq: Once | INTRAVENOUS | Status: AC
  Administered 2012-12-06: 1000 mL via INTRAVENOUS

## 2012-12-06 MED ORDER — DIPHENHYDRAMINE HCL 50 MG/ML IJ SOLN
25.0000 mg | Freq: Once | INTRAMUSCULAR | Status: AC
  Administered 2012-12-06: 25 mg via INTRAVENOUS
  Filled 2012-12-06: qty 1

## 2012-12-06 MED ORDER — DEXAMETHASONE SODIUM PHOSPHATE 4 MG/ML IJ SOLN
10.0000 mg | Freq: Once | INTRAMUSCULAR | Status: AC
  Administered 2012-12-06: 10 mg via INTRAVENOUS
  Filled 2012-12-06: qty 3

## 2012-12-06 MED ORDER — METOCLOPRAMIDE HCL 5 MG/ML IJ SOLN
10.0000 mg | Freq: Once | INTRAMUSCULAR | Status: AC
  Administered 2012-12-06: 10 mg via INTRAVENOUS
  Filled 2012-12-06: qty 2

## 2012-12-06 MED ORDER — SODIUM CHLORIDE 0.9 % IV SOLN
INTRAVENOUS | Status: DC
  Administered 2012-12-06: 09:00:00 via INTRAVENOUS

## 2012-12-06 NOTE — ED Notes (Signed)
Pt states migraine x 2 days. Denies vomiting.

## 2012-12-06 NOTE — ED Provider Notes (Signed)
History     This chart was scribed for Mervin Kung, MD, MD by Rhae Lerner, ED Scribe. The patient was seen in room APA03/APA03 and the patient's care was started at 7:59 AM.   CSN: [id number]  Arrival date & time 12/06/12  [facility]      Chief Complaint  Patient presents with  . Migraine    Patient is a 42 y.o. male presenting with migraines. The history is provided by the patient. No language interpreter was used.  Migraine This is a recurrent problem. The current episode started yesterday. The problem occurs constantly. The problem has not changed since onset.Associated symptoms include headaches. Pertinent negatives include no chest pain and no shortness of breath. Nothing aggravates the symptoms. Nothing relieves the symptoms. He has tried nothing for the symptoms.   [name] is a 42 y.o. male who presents to the Emergency Department complaining of constant, moderate migraine onset 1 day ago. Pt reports that the pain is located at temporals and forehead. He reports this feels similar to past migraines. He denies taking medication PTA. He has mild blurred vision and numbness in upper extremities. Pt denies fever, chills, nausea, vomiting, diarrhea, weakness, cough, SOB and any other pain.   PCP is Dr. Moshe Cipro    Past Medical History  Diagnosis Date  . Chronic back pain   . Abdominal tenderness   . Unspecified essential hypertension   . Headache   . Obesity, unspecified   . Headache   . Essential hypertension, benign   . Duodenitis with bleeding 08/2008    with ulcer / admitted to [facility]   . GERD (gastroesophageal reflux disease)   . Diabetes mellitus without complication   . Weight gain     mild  . Hyperlipidemia     on medication  . BPH (benign prostatic hyperplasia)     mild    Past Surgical History  Procedure Laterality Date  . Foot fracture surgery      Family History  Problem Relation Age of Onset  . Diabetes Mother   . Hypertension  Mother   . Hypertension Father   . Arthritis Father   . Diabetes Father     History  Substance Use Topics  . Smoking status: Former Smoker -- 0.50 packs/day    Types: Cigarettes    Quit date: 05/28/2012  . Smokeless tobacco: Not on file  . Alcohol Use: No      Review of Systems  Constitutional: Negative for fever and chills.  HENT: Negative for congestion.   Eyes: Positive for photophobia and visual disturbance.  Respiratory: Negative for cough and shortness of breath.   Cardiovascular: Negative for chest pain.  Gastrointestinal: Negative for nausea, vomiting and diarrhea.  Genitourinary: Negative for dysuria.  Musculoskeletal: Negative for back pain.  Skin: Negative for rash.  Neurological: Positive for headaches.  Hematological: Does not bruise/bleed easily.  Psychiatric/Behavioral: Negative for confusion.  All other systems reviewed and are negative.    Allergies  Bee venom; Prochlorperazine edisylate; Cyclobenzaprine hcl; Aleve; Carbamazepine; Ibuprofen; Tylenol; and Other  Home Medications   Current Outpatient Rx  Name  Route  Sig  Dispense  Refill  . AZOR 10-40 MG per tablet   Oral   Take 1 tablet by mouth every morning.          . cloNIDine (CATAPRES) 0.3 MG tablet   Oral   Take 0.3 mg by mouth at bedtime.         Marland Kitchen HYDROcodone-acetaminophen (  NORCO) 10-325 MG per tablet      TAKE 1 TABLET BY MOUTH TWICE DAILY AS NEEDED FOR PAIN * MAX OF 8 TABS PER WEEK   30 tablet   1   . triamterene-hydrochlorothiazide (MAXZIDE-25) 37.5-25 MG per tablet   Oral   Take 1 each (1 tablet total) by mouth daily.   30 tablet   11     BP 107/71  Pulse 73  Temp(Src) 97.7 F (36.5 C) (Oral)  Resp 16  Ht 6' (1.829 m)  Wt 195 lb (88.451 kg)  BMI 26.44 kg/m2  SpO2 100%  Physical Exam  Nursing note and vitals reviewed. Constitutional: He is oriented to person, place, and time. He appears well-developed and well-nourished. No distress.  HENT:  Head:  Normocephalic and atraumatic.  Neck: Normal range of motion. Neck supple.  Cardiovascular: Normal rate, regular rhythm and normal heart sounds.   No murmur heard. Pulmonary/Chest: Effort normal and breath sounds normal. No respiratory distress.  Abdominal: Soft. He exhibits no distension.  Musculoskeletal: Normal range of motion.  Lymphadenopathy:    He has no cervical adenopathy.  Neurological: He is alert and oriented to person, place, and time. He has normal strength. No cranial nerve deficit.  Skin: Skin is warm and dry.  Psychiatric: He has a normal mood and affect. His behavior is normal.    ED Course  Procedures (including critical care time) DIAGNOSTIC STUDIES: Oxygen Saturation is 100% on room air, normal by my interpretation.    COORDINATION OF CARE: 8:03 AM Discussed ED treatment with pt and pt agrees.  8:03 AM Ordered:  Medications  0.9 %  sodium chloride infusion ( Intravenous Not Given 12/06/12 0953)  sodium chloride 0.9 % bolus 1,000 mL (0 mLs Intravenous Stopped 12/06/12 0952)  dexamethasone (DECADRON) injection 10 mg (10 mg Intravenous Given 12/06/12 0831)  diphenhydrAMINE (BENADRYL) injection 25 mg (25 mg Intravenous Given 12/06/12 0831)  metoCLOPramide (REGLAN) injection 10 mg (10 mg Intravenous Given 12/06/12 0830)       Labs Reviewed - No data to display No results found.   1. Migraine       MDM  Migraine cocktail given to the patient in the emergency department for improvement in headache not completely resolved but improved. Patient will feels well enough to go home at this point in time. This was a typical type migraine for him.   I personally performed the services described in this documentation, which was scribed in my presence. The recorded information has been reviewed and is accurate.        Mervin Kung, MD 12/06/12 1005

## 2013-01-14 ENCOUNTER — Encounter (HOSPITAL_COMMUNITY): Payer: Self-pay | Admitting: Emergency Medicine

## 2013-01-14 ENCOUNTER — Other Ambulatory Visit: Payer: Self-pay | Admitting: Family Medicine

## 2013-01-14 ENCOUNTER — Telehealth: Payer: Self-pay | Admitting: Family Medicine

## 2013-01-14 ENCOUNTER — Emergency Department (HOSPITAL_COMMUNITY)
Admission: EM | Admit: 2013-01-14 | Discharge: 2013-01-14 | Disposition: A | Discharge status: Home / Self Care | Payer: Managed Care, Other (non HMO) | Attending: Emergency Medicine | Admitting: Emergency Medicine

## 2013-01-14 DIAGNOSIS — R110 Nausea: Secondary | ICD-10-CM | POA: Insufficient documentation

## 2013-01-14 DIAGNOSIS — R51 Headache: Secondary | ICD-10-CM

## 2013-01-14 DIAGNOSIS — R42 Dizziness and giddiness: Secondary | ICD-10-CM | POA: Insufficient documentation

## 2013-01-14 DIAGNOSIS — G43909 Migraine, unspecified, not intractable, without status migrainosus: Secondary | ICD-10-CM

## 2013-01-14 DIAGNOSIS — M549 Dorsalgia, unspecified: Secondary | ICD-10-CM | POA: Insufficient documentation

## 2013-01-14 DIAGNOSIS — Z87448 Personal history of other diseases of urinary system: Secondary | ICD-10-CM | POA: Insufficient documentation

## 2013-01-14 DIAGNOSIS — Z79899 Other long term (current) drug therapy: Secondary | ICD-10-CM | POA: Insufficient documentation

## 2013-01-14 DIAGNOSIS — Z8719 Personal history of other diseases of the digestive system: Secondary | ICD-10-CM | POA: Insufficient documentation

## 2013-01-14 DIAGNOSIS — Z87891 Personal history of nicotine dependence: Secondary | ICD-10-CM | POA: Insufficient documentation

## 2013-01-14 DIAGNOSIS — H53149 Visual discomfort, unspecified: Secondary | ICD-10-CM | POA: Insufficient documentation

## 2013-01-14 DIAGNOSIS — G8929 Other chronic pain: Secondary | ICD-10-CM | POA: Insufficient documentation

## 2013-01-14 DIAGNOSIS — E785 Hyperlipidemia, unspecified: Secondary | ICD-10-CM | POA: Insufficient documentation

## 2013-01-14 DIAGNOSIS — E669 Obesity, unspecified: Secondary | ICD-10-CM | POA: Insufficient documentation

## 2013-01-14 DIAGNOSIS — I10 Essential (primary) hypertension: Secondary | ICD-10-CM | POA: Insufficient documentation

## 2013-01-14 MED ORDER — DIPHENHYDRAMINE HCL 50 MG/ML IJ SOLN
25.0000 mg | Freq: Once | INTRAMUSCULAR | Status: AC
  Administered 2013-01-14: 25 mg via INTRAVENOUS
  Filled 2013-01-14: qty 1

## 2013-01-14 MED ORDER — DEXAMETHASONE SODIUM PHOSPHATE 4 MG/ML IJ SOLN
INTRAMUSCULAR | Status: AC
  Administered 2013-01-14: 10 mg
  Filled 2013-01-14: qty 3

## 2013-01-14 MED ORDER — METOCLOPRAMIDE HCL 5 MG/ML IJ SOLN
10.0000 mg | Freq: Once | INTRAMUSCULAR | Status: AC
  Administered 2013-01-14: 10 mg via INTRAVENOUS
  Filled 2013-01-14: qty 2

## 2013-01-14 MED ORDER — DEXAMETHASONE SODIUM PHOSPHATE 10 MG/ML IJ SOLN
10.0000 mg | Freq: Once | INTRAMUSCULAR | Status: AC
  Filled 2013-01-14: qty 1

## 2013-01-14 MED ORDER — DEXAMETHASONE SODIUM PHOSPHATE 4 MG/ML IJ SOLN
INTRAMUSCULAR | Status: AC
  Filled 2013-01-14: qty 1

## 2013-01-14 MED ORDER — SODIUM CHLORIDE 0.9 % IV BOLUS (SEPSIS)
1000.0000 mL | Freq: Once | INTRAVENOUS | Status: AC
  Administered 2013-01-14: 1000 mL via INTRAVENOUS

## 2013-01-14 NOTE — ED Notes (Signed)
Patient c/o headache since yesterday.  States has been taking his headache medicine without relief.

## 2013-01-14 NOTE — Telephone Encounter (Signed)
pls refer to Dr Merlene Laughter re uncontrolled headaches

## 2013-01-14 NOTE — Telephone Encounter (Signed)
Called and left message for patient to return call.  

## 2013-01-14 NOTE — Telephone Encounter (Signed)
Pt was referred to dr. Merlene Laughter and also aware of this

## 2013-01-14 NOTE — Telephone Encounter (Signed)
Patient agrees to referral and states that he "promises" that he will go.

## 2013-01-14 NOTE — ED Provider Notes (Signed)
History     CSN: [id number]  Arrival date & time 01/14/13  0202   First MD Initiated Contact with Patient 01/14/13 0220      Chief Complaint  Patient presents with  . Headache    (Consider location/radiation/quality/duration/timing/severity/associated sxs/prior treatment) HPI This is a 42 year old male with a history of migraines. He is here with a migraine headache that began yesterday evening. He also has been gradual. The symptoms are moderate at this point and he came to have it treated before it became severe. The headache is bitemporal described as a drumming. It is associated with nausea, dizziness and photophobia. He has not vomited. There is no focal neurologic deficit.  Past Medical History  Diagnosis Date  . Chronic back pain   . Abdominal tenderness   . Unspecified essential hypertension   . Headache   . Obesity, unspecified   . Headache   . Essential hypertension, benign   . Duodenitis with bleeding 08/2008    with ulcer / admitted to [facility]   . GERD (gastroesophageal reflux disease)   . Weight gain     mild  . Hyperlipidemia     on medication  . BPH (benign prostatic hyperplasia)     mild    Past Surgical History  Procedure Laterality Date  . Foot fracture surgery      Family History  Problem Relation Age of Onset  . Diabetes Mother   . Hypertension Mother   . Hypertension Father   . Arthritis Father   . Diabetes Father     History  Substance Use Topics  . Smoking status: Former Smoker -- 0.50 packs/day    Types: Cigarettes    Quit date: 05/28/2012  . Smokeless tobacco: Not on file  . Alcohol Use: No      Review of Systems  All other systems reviewed and are negative.    Allergies  Bee venom; Prochlorperazine edisylate; Cyclobenzaprine hcl; Aleve; Carbamazepine; Ibuprofen; Tylenol; and Other  Home Medications   Current Outpatient Rx  Name  Route  Sig  Dispense  Refill  . AZOR 10-40 MG per tablet   Oral   Take 1  tablet by mouth every morning.          . cloNIDine (CATAPRES) 0.3 MG tablet   Oral   Take 0.3 mg by mouth at bedtime.         Marland Kitchen HYDROcodone-acetaminophen (NORCO) 10-325 MG per tablet      TAKE 1 TABLET BY MOUTH TWICE DAILY AS NEEDED FOR PAIN * MAX OF 8 TABS PER WEEK   30 tablet   1   . triamterene-hydrochlorothiazide (MAXZIDE-25) 37.5-25 MG per tablet   Oral   Take 1 each (1 tablet total) by mouth daily.   30 tablet   11     BP 137/81  Pulse 54  Temp(Src) 98.1 F (36.7 C) (Oral)  Resp 20  Wt 190 lb (86.183 kg)  BMI 25.76 kg/m2  SpO2 97%  Physical Exam General: Well-developed, well-nourished male in no acute distress; appearance consistent with age of record HENT: normocephalic, atraumatic Eyes: pupils equal round and reactive to light; extraocular muscles intact Neck: supple Heart: regular rate and rhythm Lungs: clear to auscultation bilaterally Abdomen: soft; nondistended; nontender Extremities: No deformity; full range of motion Neurologic: Awake, alert and oriented; motor function intact in all extremities and symmetric; no facial droop; normal coordination and speech Skin: Warm and dry Psychiatric: Flat affect    ED Course  Procedures (including  critical care time)    MDM  3:45 AM Patient feeling much better after IV fluids and medications. He states he is ready to go home.        Wynetta Fines, MD 01/14/13 [phone]

## 2013-01-14 NOTE — Telephone Encounter (Signed)
Pls contact pt and advise I am concerned re the recurrent need to go to Ed for headaches. He DOES need to see a neurologist to have this addressed and better managed, as I have advised multiple times in the past.  Pls let him know that I need his agreement before I can refer, so this is the purpose of the call, to see if he intends to go , and I will be happy to refer, he NEEDS this, recurerent , uncontrolled headaches are disabling, he also needs further evaluation for them by the specialist

## 2013-01-15 ENCOUNTER — Telehealth: Payer: Self-pay | Admitting: Family Medicine

## 2013-01-15 MED ORDER — HYDROCODONE-ACETAMINOPHEN 10-325 MG PO TABS: ORAL_TABLET | ORAL | Status: DC

## 2013-01-15 NOTE — Telephone Encounter (Signed)
Printed pain med to be signed and will fax

## 2013-01-22 ENCOUNTER — Encounter (HOSPITAL_COMMUNITY): Payer: Self-pay | Admitting: Emergency Medicine

## 2013-01-22 ENCOUNTER — Emergency Department (HOSPITAL_COMMUNITY)
Admission: EM | Admit: 2013-01-22 | Discharge: 2013-01-22 | Disposition: A | Discharge status: Home / Self Care | Payer: Managed Care, Other (non HMO) | Attending: Emergency Medicine | Admitting: Emergency Medicine

## 2013-01-22 DIAGNOSIS — R42 Dizziness and giddiness: Secondary | ICD-10-CM | POA: Insufficient documentation

## 2013-01-22 DIAGNOSIS — Z760 Encounter for issue of repeat prescription: Secondary | ICD-10-CM

## 2013-01-22 DIAGNOSIS — I10 Essential (primary) hypertension: Secondary | ICD-10-CM | POA: Insufficient documentation

## 2013-01-22 DIAGNOSIS — Z79899 Other long term (current) drug therapy: Secondary | ICD-10-CM | POA: Insufficient documentation

## 2013-01-22 DIAGNOSIS — E669 Obesity, unspecified: Secondary | ICD-10-CM | POA: Insufficient documentation

## 2013-01-22 DIAGNOSIS — Z862 Personal history of diseases of the blood and blood-forming organs and certain disorders involving the immune mechanism: Secondary | ICD-10-CM | POA: Insufficient documentation

## 2013-01-22 DIAGNOSIS — Z8719 Personal history of other diseases of the digestive system: Secondary | ICD-10-CM | POA: Insufficient documentation

## 2013-01-22 DIAGNOSIS — Z8639 Personal history of other endocrine, nutritional and metabolic disease: Secondary | ICD-10-CM | POA: Insufficient documentation

## 2013-01-22 DIAGNOSIS — R61 Generalized hyperhidrosis: Secondary | ICD-10-CM | POA: Insufficient documentation

## 2013-01-22 DIAGNOSIS — Z87891 Personal history of nicotine dependence: Secondary | ICD-10-CM | POA: Insufficient documentation

## 2013-01-22 DIAGNOSIS — R51 Headache: Secondary | ICD-10-CM

## 2013-01-22 MED ORDER — DEXAMETHASONE SODIUM PHOSPHATE 4 MG/ML IJ SOLN
10.0000 mg | Freq: Once | INTRAMUSCULAR | Status: AC
  Administered 2013-01-22: 10 mg via INTRAVENOUS

## 2013-01-22 MED ORDER — SODIUM CHLORIDE 0.9 % IV SOLN: 1000.0000 mL | INTRAVENOUS | Status: DC

## 2013-01-22 MED ORDER — DIPHENHYDRAMINE HCL 50 MG/ML IJ SOLN
25.0000 mg | Freq: Once | INTRAMUSCULAR | Status: AC
  Administered 2013-01-22: 25 mg via INTRAVENOUS

## 2013-01-22 MED ORDER — SODIUM CHLORIDE 0.9 % IV SOLN
1000.0000 mL | Freq: Once | INTRAVENOUS | Status: AC
  Administered 2013-01-22: 1000 mL via INTRAVENOUS

## 2013-01-22 MED ORDER — METOCLOPRAMIDE HCL 5 MG/ML IJ SOLN
10.0000 mg | Freq: Once | INTRAMUSCULAR | Status: AC
  Administered 2013-01-22: 10 mg via INTRAVENOUS

## 2013-01-22 MED ORDER — AMLODIPINE-OLMESARTAN 10-40 MG PO TABS: 1.0000 | ORAL_TABLET | Freq: Every day | ORAL | Status: DC

## 2013-01-22 MED ORDER — CLONIDINE HCL 0.1 MG PO TABS
0.1000 mg | ORAL_TABLET | Freq: Once | ORAL | Status: AC
  Administered 2013-01-22: 0.1 mg via ORAL

## 2013-01-22 MED ORDER — CLONIDINE HCL 0.2 MG PO TABS: 0.3000 mg | ORAL_TABLET | Freq: Every day | ORAL | Status: DC

## 2013-01-22 NOTE — ED Notes (Addendum)
Pt states that he started having a severe headache last night, and he took Triamterene, which he believes is for fluid. Pt reports being out of his blood pressure medication (Azor) for 3 days.

## 2013-01-22 NOTE — ED Provider Notes (Signed)
History  This chart was scribed for Janice Norrie, MD by Jenne Campus, ED Scribe. This patient was seen in room APA04/APA04 and the patient's care was started at 7:14 AM.  CSN: [id number]  Arrival date & time 01/22/13  0700   First MD Initiated Contact with Patient 01/22/13 [phone]      Chief Complaint  Patient presents with  . Headache  . Hypertension     Patient is a 42 y.o. male presenting with headaches. The history is provided by the patient. No language interpreter was used.  Headache Pain location:  R parietal and R temporal Quality: pressure. Radiates to:  Does not radiate Onset quality:  Gradual Timing:  Constant Progression:  Worsening Chronicity:  Recurrent Similar to prior headaches: no   Context comment:  Upon waking Worsened by:  Light and sound Associated symptoms: photophobia   Associated symptoms: no nausea, no numbness and no vomiting     HPI Comments: [name] is a 42 y.o. male with a h/o migraines who presents to the Emergency Department complaining of a  gradual onset, gradually worsening, constant, severe, right-sided HA described as pressure and aching with associated lightheadedness that started this morning while he was getting up out of bed. He reports sounds and photophobia as aggravating associated symptoms and states that the HA is mildly improved with sitting up. He has a h/o HTN and states that this HA is similar to prior HTN HAs. He is currently taking Triamterene, clondine and Azor for his HTN. He reports taking Triamterene with no improvement. He states that he ran out of his Azor medication 3 days ago and his clondine medication yesterday. He admits that he gets his medications by mail and reports that they are supposed to arrive today. He denies similarities with his migraines stating that the migraines are typically located bifrontally and are described as throbbing. He denies nausea, emesis, visual disturbance, shortness of breath, chest  pain, tingling and numbness or weakness in his extremities as associated symptoms. Pt is a former smoker but denies alcohol use.  PCP is Dr. Moshe Cipro  Past Medical History  Diagnosis Date  . Chronic back pain   . Abdominal tenderness   . Unspecified essential hypertension   . Headache   . Headache   . Essential hypertension, benign   . Duodenitis with bleeding 08/2008    with ulcer / admitted to [facility]   . GERD (gastroesophageal reflux disease)   . Weight gain     mild  . Hyperlipidemia     on medication  . Obesity, unspecified     Past Surgical History  Procedure Laterality Date  . Foot fracture surgery      Family History  Problem Relation Age of Onset  . Diabetes Mother   . Hypertension Mother   . Hypertension Father   . Arthritis Father   . Diabetes Father     History  Substance Use Topics  . Smoking status: Former Smoker -- 0.50 packs/day    Types: Cigarettes    Quit date: 05/28/2012  . Smokeless tobacco: Not on file  . 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 Negative for visual disturbance.  Respiratory: Negative for shortness of breath.   Cardiovascular: Negative for chest pain.  Gastrointestinal: Negative for nausea and vomiting.  Neurological: Positive for light-headedness and headaches. Negative for weakness and numbness.  All other systems reviewed and are  negative.    Allergies  Bee venom; Prochlorperazine edisylate; Cyclobenzaprine hcl; Aleve; Carbamazepine; Ibuprofen; Tylenol; and Other  Home Medications   Current Outpatient Rx  Name  Route  Sig  Dispense  Refill  . AZOR 10-40 MG per tablet   Oral   Take 1 tablet by mouth every morning.    Ran out 3 days ago      . cloNIDine (CATAPRES) 0.3 MG tablet   Oral   Take 0.3 mg by mouth at bedtime.    Ran out yesterday     . HYDROcodone-acetaminophen (NORCO) 10-325 MG per tablet      TAKE 1 TABLET BY MOUTH TWICE DAILY AS  NEEDED FOR PAIN * MAX OF 8 TABS PER WEEK   30 tablet Prescribed 4/29  0   . triamterene-hydrochlorothiazide (MAXZIDE-25) 37.5-25 MG per tablet   Oral   Take 1 each (1 tablet total) by mouth daily.   30 tablet   11     Triage Vitals: BP 153/127  Pulse 59  Temp(Src) 98.2 F (36.8 C) (Oral)  Ht 6' (1.829 m)  Wt 193 lb (87.544 kg)  BMI 26.17 kg/m2  SpO2 98%  Vital signs normal mild bradycardia, diastolic hypertension   Physical Exam  Nursing note and vitals reviewed. Constitutional: He is oriented to person, place, and time. He appears well-developed and well-nourished.  Non-toxic appearance. He does not appear ill. No distress.  Pt appears painful, holding the right side of his head  HENT:  Head: Normocephalic and atraumatic.  Right Ear: External ear normal.  Left Ear: External ear normal.  Nose: Nose normal. No mucosal edema or rhinorrhea.  Mouth/Throat: Oropharynx is clear and moist and mucous membranes are normal. No dental abscesses or edematous.  Eyes: Conjunctivae and EOM are normal. Pupils are equal, round, and reactive to light.  Neck: Normal range of motion and full passive range of motion without pain. Neck supple.  Cardiovascular: Normal rate, regular rhythm and normal heart sounds.  Exam reveals no gallop and no friction rub.   No murmur heard. Pulmonary/Chest: Effort normal and breath sounds normal. No respiratory distress. He has no wheezes. He has no rhonchi. He has no rales. He exhibits no tenderness and no crepitus.  Abdominal: Soft. Normal appearance and bowel sounds are normal. He exhibits no distension. There is no tenderness. There is no rebound and no guarding.  Musculoskeletal: Normal range of motion. He exhibits no edema and no tenderness.  Moves all extremities well.   Neurological: He is alert and oriented to person, place, and time. He has normal strength. No cranial nerve deficit.  Skin: Skin is warm, dry and intact. No rash noted. No erythema. No  pallor.  Psychiatric: He has a normal mood and affect. His speech is normal and behavior is normal. His mood appears not anxious.    ED Course  Procedures (including critical care time)  Medications  0.9 %  sodium chloride infusion (0 mLs Intravenous Stopped 01/22/13 0829)  metoCLOPramide (REGLAN) injection 10 mg (10 mg Intravenous Given 01/22/13 0739)  diphenhydrAMINE (BENADRYL) injection 25 mg (25 mg Intravenous Given 01/22/13 0740)  cloNIDine (CATAPRES) tablet 0.1 mg (0.1 mg Oral Given 01/22/13 0740)  dexamethasone (DECADRON) injection 10 mg (10 mg Intravenous Given 01/22/13 0738)    DIAGNOSTIC STUDIES: Oxygen Saturation is 100 % on room air, normal, by my interpretation.    COORDINATION OF CARE: 7:12 AM-Per review of pt's records, pt contacted his PCP on April 28th and April 29th to get  norco for his headaches.  We discussed stopping clonidine without tapering can cause rebound hypertension.   7:16 AM-Discussed treatment plan which includes migraine cocktail with pt at bedside and pt agreed to plan.   8:15 AM-BP is improved to 150/87. Pt rechecked and feels improved with medications listed above and is requesting to leave. Will prescribe Azor and Clondine for 5 days incase his medications are late being delivered. Advised pt to follow up with PCP and pt agreed.  1. Hypertension   2. Headache   3. Medication refill     Discharge Medication List as of 01/22/2013  8:21 AM    START taking these medications   Details  !! amLODipine-olmesartan (AZOR) 10-40 MG per tablet Take 1 tablet by mouth daily., Starting 01/22/2013, Until Discontinued, Print    !! cloNIDine (CATAPRES) 0.2 MG tablet Take 1.5 tablets (0.3 mg total) by mouth at bedtime., Starting 01/22/2013, Until Discontinued, Print     !! - Potential duplicate medications found. Please discuss with provider.     Plan discharge  Rolland Porter, MD, FACEP   MDM   I personally performed the services described in this documentation, which  was scribed in my presence. The recorded information has been reviewed and considered.  Rolland Porter, MD, [name]    Janice Norrie, MD 01/22/13 [phone]

## 2013-01-22 NOTE — ED Notes (Signed)
Pt reports has been out of his bp medication for the past 3 days.  C/O headache since yesterday.

## 2013-01-29 ENCOUNTER — Encounter: Payer: Self-pay | Admitting: Family Medicine

## 2013-01-29 ENCOUNTER — Ambulatory Visit (INDEPENDENT_AMBULATORY_CARE_PROVIDER_SITE_OTHER): Payer: Managed Care, Other (non HMO) | Admitting: Family Medicine

## 2013-01-29 VITALS — BP 110/74 | HR 88 | Resp 18 | Ht 72.5 in | Wt 189.0 lb

## 2013-01-29 DIAGNOSIS — R51 Headache: Secondary | ICD-10-CM

## 2013-01-29 DIAGNOSIS — N289 Disorder of kidney and ureter, unspecified: Secondary | ICD-10-CM

## 2013-01-29 DIAGNOSIS — I10 Essential (primary) hypertension: Secondary | ICD-10-CM

## 2013-01-29 NOTE — Patient Instructions (Addendum)
F/u in 6 month, call if you need me before.  Blood pressure is excellent , no med change   Fasting chem 7, EGFR  and CBC as soon as possible  Please ensure you drink at least 64 ounces water daily  Please return 3 stool cards provided for testing for hidden blood in the stool  Please work with the neurologist with the goal of reducing headache frequency and severity

## 2013-01-29 NOTE — Progress Notes (Signed)
  Subjective:    Patient ID: [name], male    DOB: [birth year], 42 y.o.   MRN: [id number]  HPI  Pt has noted increased frequency and severity of headaches, up to 2 to 3 times per week, sometimes associated with nausea, never vomits States in the past 2 to 3 weeks he has started having vision disturbance before the  headaches, which are tightness  And pounding from temple to temple and throbbing. Headache duration can be up to 2 days. Has neurology appt next week. In the Ed approx 2 to 3 times in the past 4 weeks with headaches. No lasting neurologic deficits post headache, feels drained after headache. 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 Denies sinus pressure, nasal congestion, ear pain or sore throat. Denies chest congestion, productive cough or wheezing. Denies chest pains, palpitations and leg swelling Denies abdominal pain, nausea, vomiting,diarrhea or constipation.   Denies dysuria, frequency, hesitancy or incontinence. Denies joint pain, swelling and limitation in mobility.  Denies depression, anxiety or insomnia. Denies skin break down or rash.         Objective:   Physical Exam  Patient alert and oriented and in no cardiopulmonary distress.  HEENT: No facial asymmetry, EOMI, no sinus tenderness,  oropharynx pink and moist.  Neck supple no adenopathy.Fundoscopy, no exudate or hemmorhage  Chest: Clear to auscultation bilaterally.  CVS: S1, S2 no murmurs, no S3.  ABD: Soft non tender. Bowel sounds normal.  Ext: No edema  MS: Adequate ROM spine, shoulders, hips and knees.  Skin: Intact, no ulcerations or rash noted.  Psych: Good eye contact, normal affect. Memory intact not anxious or depressed appearing.  CNS: CN  2-12 intact, power, tone and sensation normal throughout.       Assessment & Plan:

## 2013-01-31 ENCOUNTER — Emergency Department (HOSPITAL_COMMUNITY)
Admission: EM | Admit: 2013-01-31 | Discharge: 2013-02-01 | Disposition: A | Discharge status: Home / Self Care | Payer: Managed Care, Other (non HMO) | Attending: Emergency Medicine | Admitting: Emergency Medicine

## 2013-01-31 ENCOUNTER — Encounter (HOSPITAL_COMMUNITY): Payer: Self-pay

## 2013-01-31 ENCOUNTER — Emergency Department (HOSPITAL_COMMUNITY): Payer: Managed Care, Other (non HMO)

## 2013-01-31 DIAGNOSIS — G8929 Other chronic pain: Secondary | ICD-10-CM | POA: Insufficient documentation

## 2013-01-31 DIAGNOSIS — Z79899 Other long term (current) drug therapy: Secondary | ICD-10-CM | POA: Insufficient documentation

## 2013-01-31 DIAGNOSIS — M65839 Other synovitis and tenosynovitis, unspecified forearm: Secondary | ICD-10-CM | POA: Insufficient documentation

## 2013-01-31 DIAGNOSIS — E669 Obesity, unspecified: Secondary | ICD-10-CM | POA: Insufficient documentation

## 2013-01-31 DIAGNOSIS — E785 Hyperlipidemia, unspecified: Secondary | ICD-10-CM | POA: Insufficient documentation

## 2013-01-31 DIAGNOSIS — K219 Gastro-esophageal reflux disease without esophagitis: Secondary | ICD-10-CM | POA: Insufficient documentation

## 2013-01-31 DIAGNOSIS — Z8669 Personal history of other diseases of the nervous system and sense organs: Secondary | ICD-10-CM | POA: Insufficient documentation

## 2013-01-31 DIAGNOSIS — M65849 Other synovitis and tenosynovitis, unspecified hand: Secondary | ICD-10-CM | POA: Insufficient documentation

## 2013-01-31 DIAGNOSIS — M778 Other enthesopathies, not elsewhere classified: Secondary | ICD-10-CM

## 2013-01-31 DIAGNOSIS — IMO0002 Reserved for concepts with insufficient information to code with codable children: Secondary | ICD-10-CM | POA: Insufficient documentation

## 2013-01-31 DIAGNOSIS — Z8719 Personal history of other diseases of the digestive system: Secondary | ICD-10-CM | POA: Insufficient documentation

## 2013-01-31 DIAGNOSIS — Z87891 Personal history of nicotine dependence: Secondary | ICD-10-CM | POA: Insufficient documentation

## 2013-01-31 DIAGNOSIS — I10 Essential (primary) hypertension: Secondary | ICD-10-CM | POA: Insufficient documentation

## 2013-01-31 DIAGNOSIS — Z8739 Personal history of other diseases of the musculoskeletal system and connective tissue: Secondary | ICD-10-CM | POA: Insufficient documentation

## 2013-01-31 NOTE — ED Notes (Signed)
Pt c/o pain to base of right pinky finger part of hand, denies injury

## 2013-02-01 MED ORDER — FAMOTIDINE 20 MG PO TABS: 20.0000 mg | ORAL_TABLET | Freq: Two times a day (BID) | ORAL | Status: DC

## 2013-02-01 MED ORDER — HYDROCODONE-ACETAMINOPHEN 5-325 MG PO TABS: 1.0000 | ORAL_TABLET | ORAL | Status: DC | PRN

## 2013-02-01 MED ORDER — PREDNISONE 10 MG PO TABS: 20.0000 mg | ORAL_TABLET | Freq: Two times a day (BID) | ORAL | Status: DC

## 2013-02-01 NOTE — ED Notes (Signed)
Pt reporting pain in right hand, around knuckle of pinky. No known injury.  Pt states unable to make a tight fist.  Denies numbness or tingling.

## 2013-02-01 NOTE — ED Provider Notes (Signed)
Medical screening examination/treatment/procedure(s) were performed by non-physician practitioner and as supervising physician I was immediately available for consultation/collaboration.  Teressa Lower, MD 02/01/13 [phone]

## 2013-02-01 NOTE — ED Provider Notes (Signed)
History     CSN: [id number]  Arrival date & time 01/31/13  2307   First MD Initiated Contact with Patient 01/31/13 2333      Chief Complaint  Patient presents with  . Hand Pain    (Consider location/radiation/quality/duration/timing/severity/associated sxs/prior treatment) HPI [name] is a 42 y.o. male who presents to the ED with right hand pain. The pain is located on the ulnar aspect of the right hand with swelling and pain radiating to the little finger. No known injury. He does repetitive motion on his job. The pain started 2 or 3 days ago and has gotten progressively worse. He rates the pain as moderate to severe. The pain increases with pressure to the area or movement. He has taken nothing for pain. The history was provided by the patient.   Past Medical History  Diagnosis Date  . Chronic back pain   . Abdominal tenderness   . Unspecified essential hypertension   . Headache   . Headache   . Essential hypertension, benign   . Duodenitis with bleeding 08/2008    with ulcer / admitted to [facility]   . GERD (gastroesophageal reflux disease)   . Weight gain     mild  . Hyperlipidemia     on medication  . Obesity, unspecified     Past Surgical History  Procedure Laterality Date  . Foot fracture surgery      Family History  Problem Relation Age of Onset  . Diabetes Mother   . Hypertension Mother   . Hypertension Father   . Arthritis Father   . Diabetes Father     History  Substance Use Topics  . Smoking status: Former Smoker -- 0.50 packs/day    Types: Cigarettes    Quit date: 05/28/2012  . Smokeless tobacco: Not on file  . Alcohol Use: No      Review of Systems  Constitutional: Negative for fever.  Gastrointestinal: Negative for nausea and vomiting.  Musculoskeletal:       Right hand pain  Skin: Negative for wound.  Neurological: Negative for headaches.  Psychiatric/Behavioral: The patient is not nervous/anxious.     Allergies    Bee venom; Prochlorperazine edisylate; Cyclobenzaprine hcl; Aleve; Carbamazepine; Ibuprofen; Tylenol; and Other  Home Medications   Current Outpatient Rx  Name  Route  Sig  Dispense  Refill  . amLODipine-olmesartan (AZOR) 10-40 MG per tablet   Oral   Take 1 tablet by mouth daily.   5 tablet   0   . cloNIDine (CATAPRES) 0.3 MG tablet   Oral   Take 0.3 mg by mouth at bedtime.         Marland Kitchen esomeprazole (NEXIUM) 40 MG capsule   Oral   Take 40 mg by mouth daily before breakfast.         . HYDROcodone-acetaminophen (NORCO) 10-325 MG per tablet      TAKE 1 TABLET BY MOUTH TWICE DAILY AS NEEDED FOR PAIN * MAX OF 8 TABS PER WEEK   30 tablet   0   . triamterene-hydrochlorothiazide (MAXZIDE-25) 37.5-25 MG per tablet   Oral   Take 1 each (1 tablet total) by mouth daily.   30 tablet   11   . HYDROcodone-acetaminophen (NORCO/VICODIN) 5-325 MG per tablet   Oral   Take 1 tablet by mouth every 4 (four) hours as needed.   15 tablet   0   . predniSONE (DELTASONE) 10 MG tablet   Oral   Take 2  tablets (20 mg total) by mouth 2 (two) times daily.   20 tablet   0     BP 136/101  Pulse 89  Temp(Src) 97.9 F (36.6 C) (Oral)  Resp 16  Ht 6' (1.829 m)  Wt 185 lb (83.915 kg)  BMI 25.08 kg/m2  SpO2 99%  Physical Exam  Nursing note and vitals reviewed. Constitutional: He is oriented to person, place, and time. He appears well-developed and well-nourished. No distress.  HENT:  Head: Normocephalic.  Eyes: EOM are normal.  Neck: Neck supple.  Cardiovascular: Normal rate.   Pulmonary/Chest: Effort normal.  Musculoskeletal:       Right hand: He exhibits decreased range of motion, tenderness and swelling. He exhibits normal capillary refill, no deformity and no laceration. Normal strength noted.       Hands: Tender with palpation, swelling noted to the 5th digit.  Radial pulse strong, adequate circulation. Good touch sensation.  Neurological: He is alert and oriented to person,  place, and time. No cranial nerve deficit.  Skin: Skin is warm and dry.  Psychiatric: He has a normal mood and affect. His behavior is normal. Judgment and thought content normal.    ED Course  Procedures (including critical care time)  Labs Reviewed - No data to display Dg Hand Complete Right  02/01/2013   *RADIOLOGY REPORT*  Clinical Data: Hand pain.  RIGHT HAND - COMPLETE 3+ VIEW  Comparison: 03/28/2007  Findings: No acute bony abnormality.  Specifically, no fracture, subluxation, or dislocation.  Soft tissues are intact. Joint spaces are maintained.  Normal bone mineralization.  IMPRESSION: Negative.   Original Report Authenticated By: Rolm Baptise, M.D.     1. Hand tendonitis     MDM  42 y.o. male with right hand pain after repetitive motion at work. He has no signs of compartment syndrome at this time. Will place in splint and have him follow up with ortho. Will treat his pain and give him short course of steroids and will give Pepcid to take in addition since he has a history of ulcer disease. I discussed with the patient if he develops abdominal pain to stop the steroids.  I have reviewed this patient's vital signs, nurses notes, appropriate imaging and discussed results and plan of care with the patient and he voices understanding.   Medication List    TAKE these medications       famotidine 20 MG tablet  Commonly known as:  PEPCID  Take 1 tablet (20 mg total) by mouth 2 (two) times daily.     HYDROcodone-acetaminophen 5-325 MG per tablet  Commonly known as:  NORCO/VICODIN  Take 1 tablet by mouth every 4 (four) hours as needed.     predniSONE 10 MG tablet  Commonly known as:  DELTASONE  Take 2 tablets (20 mg total) by mouth 2 (two) times daily.      ASK your doctor about these medications       amLODipine-olmesartan 10-40 MG per tablet  Commonly known as:  AZOR  Take 1 tablet by mouth daily.     cloNIDine 0.3 MG tablet  Commonly known as:  CATAPRES  Take 0.3 mg  by mouth at bedtime.     esomeprazole 40 MG capsule  Commonly known as:  NEXIUM  Take 40 mg by mouth daily before breakfast.     HYDROcodone-acetaminophen 10-325 MG per tablet  Commonly known as:  NORCO  TAKE 1 TABLET BY MOUTH TWICE DAILY AS NEEDED FOR PAIN * MAX OF  8 TABS PER WEEK     triamterene-hydrochlorothiazide 37.5-25 MG per tablet  Commonly known as:  MAXZIDE-25  Take 1 each (1 tablet total) by mouth daily.                 [facility] Bunnie Pion, NP 02/01/13 0111

## 2013-02-24 NOTE — Assessment & Plan Note (Signed)
Importance of mainytaining gpood BP control, and avoiding NSAIDS is stressed

## 2013-02-24 NOTE — Assessment & Plan Note (Signed)
Uncontrolled, multiple Ed visits, has upcoming appt with neurology

## 2013-02-24 NOTE — Assessment & Plan Note (Signed)
Controlled, no change in medication DASH diet and commitment to daily physical activity for a minimum of 30 minutes discussed and encouraged, as a part of hypertension management. The importance of attaining a healthy weight is also discussed.  

## 2013-03-07 ENCOUNTER — Other Ambulatory Visit: Payer: Self-pay | Admitting: Family Medicine

## 2013-03-18 ENCOUNTER — Encounter: Payer: Self-pay | Admitting: Family Medicine

## 2013-03-20 ENCOUNTER — Other Ambulatory Visit: Payer: Self-pay | Admitting: Family Medicine

## 2013-04-29 ENCOUNTER — Other Ambulatory Visit: Payer: Self-pay | Admitting: Family Medicine

## 2013-05-04 ENCOUNTER — Encounter (HOSPITAL_COMMUNITY): Payer: Self-pay | Admitting: Emergency Medicine

## 2013-05-04 ENCOUNTER — Emergency Department (HOSPITAL_COMMUNITY)
Admission: EM | Admit: 2013-05-04 | Discharge: 2013-05-04 | Disposition: A | Discharge status: Home / Self Care | Payer: Managed Care, Other (non HMO) | Attending: Emergency Medicine | Admitting: Emergency Medicine

## 2013-05-04 DIAGNOSIS — K219 Gastro-esophageal reflux disease without esophagitis: Secondary | ICD-10-CM | POA: Insufficient documentation

## 2013-05-04 DIAGNOSIS — R51 Headache: Secondary | ICD-10-CM | POA: Insufficient documentation

## 2013-05-04 DIAGNOSIS — R110 Nausea: Secondary | ICD-10-CM | POA: Insufficient documentation

## 2013-05-04 DIAGNOSIS — R519 Headache, unspecified: Secondary | ICD-10-CM

## 2013-05-04 DIAGNOSIS — IMO0002 Reserved for concepts with insufficient information to code with codable children: Secondary | ICD-10-CM | POA: Insufficient documentation

## 2013-05-04 DIAGNOSIS — H53149 Visual discomfort, unspecified: Secondary | ICD-10-CM | POA: Insufficient documentation

## 2013-05-04 DIAGNOSIS — Z8639 Personal history of other endocrine, nutritional and metabolic disease: Secondary | ICD-10-CM | POA: Insufficient documentation

## 2013-05-04 DIAGNOSIS — G8929 Other chronic pain: Secondary | ICD-10-CM | POA: Insufficient documentation

## 2013-05-04 DIAGNOSIS — M549 Dorsalgia, unspecified: Secondary | ICD-10-CM | POA: Insufficient documentation

## 2013-05-04 DIAGNOSIS — Z862 Personal history of diseases of the blood and blood-forming organs and certain disorders involving the immune mechanism: Secondary | ICD-10-CM | POA: Insufficient documentation

## 2013-05-04 DIAGNOSIS — I10 Essential (primary) hypertension: Secondary | ICD-10-CM | POA: Insufficient documentation

## 2013-05-04 DIAGNOSIS — F17200 Nicotine dependence, unspecified, uncomplicated: Secondary | ICD-10-CM | POA: Insufficient documentation

## 2013-05-04 DIAGNOSIS — E669 Obesity, unspecified: Secondary | ICD-10-CM | POA: Insufficient documentation

## 2013-05-04 DIAGNOSIS — Z79899 Other long term (current) drug therapy: Secondary | ICD-10-CM | POA: Insufficient documentation

## 2013-05-04 MED ORDER — KETOROLAC TROMETHAMINE 60 MG/2ML IM SOLN
60.0000 mg | Freq: Once | INTRAMUSCULAR | Status: AC
  Administered 2013-05-04: 60 mg via INTRAMUSCULAR
  Filled 2013-05-04: qty 2

## 2013-05-04 MED ORDER — DIPHENHYDRAMINE HCL 25 MG PO CAPS
25.0000 mg | ORAL_CAPSULE | Freq: Once | ORAL | Status: AC
  Administered 2013-05-04: 25 mg via ORAL
  Filled 2013-05-04: qty 1

## 2013-05-04 MED ORDER — ONDANSETRON 8 MG PO TBDP
8.0000 mg | ORAL_TABLET | Freq: Once | ORAL | Status: AC
Start: 2013-05-04 — End: 2013-05-04
  Administered 2013-05-04: 8 mg via ORAL
  Filled 2013-05-04: qty 1

## 2013-05-04 NOTE — ED Notes (Signed)
Pt stated that he has a headache which started yesterday.

## 2013-05-04 NOTE — ED Provider Notes (Signed)
CSN: [id number]     Arrival date & time 05/04/13  1240 History     First MD Initiated Contact with Patient 05/04/13 1343     Chief Complaint  Patient presents with  . Headache   (Consider location/radiation/quality/duration/timing/severity/associated sxs/prior Treatment) Patient is a 42 y.o. male presenting with headaches. The history is provided by the patient.  Headache Pain location:  Generalized Quality:  Dull (throbbing) Radiates to:  Does not radiate Severity currently:  10/10 Severity at highest:  10/10 Onset quality:  Gradual Duration:  1 day Timing:  Constant Progression:  Worsening Chronicity:  Recurrent Similar to prior headaches: yes   Context: activity, bright light and loud noise   Relieved by:  Nothing Worsened by:  Light, sound and activity Ineffective treatments:  Prescription medications (fiorcet) Associated symptoms: nausea and photophobia   Associated symptoms: no abdominal pain, no back pain, no blurred vision, no congestion, no cough, no dizziness, no drainage, no ear pain, no pain, no facial pain, no fever, no focal weakness, no hearing loss, no loss of balance, no myalgias, no near-syncope, no neck pain, no neck stiffness, no numbness, no paresthesias, no seizures, no sinus pressure, no sore throat, no swollen glands, no syncope, no tingling, no URI, no visual change, no vomiting and no weakness     Past Medical History  Diagnosis Date  . Chronic back pain   . Abdominal tenderness   . Unspecified essential hypertension   . Headache(784.0)   . Headache(784.0)   . Essential hypertension, benign   . Duodenitis with bleeding 08/2008    with ulcer / admitted to [facility]   . GERD (gastroesophageal reflux disease)   . Weight gain     mild  . Hyperlipidemia     on medication  . Obesity, unspecified    Past Surgical History  Procedure Laterality Date  . Foot fracture surgery     Family History  Problem Relation Age of Onset  . Diabetes  Mother   . Hypertension Mother   . Hypertension Father   . Arthritis Father   . Diabetes Father    History  Substance Use Topics  . Smoking status: Current Every Day Smoker -- 0.10 packs/day    Types: Cigarettes    Last Attempt to Quit: 05/28/2012  . Smokeless tobacco: Not on file  . Alcohol Use: No    Review of Systems  Constitutional: Negative for fever, activity change and appetite change.  HENT: Negative for hearing loss, ear pain, congestion, sore throat, facial swelling, trouble swallowing, neck pain, neck stiffness, postnasal drip and sinus pressure.   Eyes: Positive for photophobia. Negative for blurred vision, pain and visual disturbance.  Respiratory: Negative for cough and shortness of breath.   Cardiovascular: Negative for chest pain, syncope and near-syncope.  Gastrointestinal: Positive for nausea. Negative for vomiting and abdominal pain.  Musculoskeletal: Negative for myalgias and back pain.  Skin: Negative for rash and wound.  Neurological: Positive for headaches. Negative for dizziness, focal weakness, seizures, syncope, facial asymmetry, speech difficulty, weakness, numbness, paresthesias and loss of balance.  Psychiatric/Behavioral: Negative for confusion and decreased concentration.  All other systems reviewed and are negative.    Allergies  Bee venom; Prochlorperazine edisylate; Cyclobenzaprine hcl; Aleve; Carbamazepine; Ibuprofen; Tylenol; and Other  Home Medications   Current Outpatient Rx  Name  Route  Sig  Dispense  Refill  . AZOR 10-40 MG per tablet      TAKE 1 TABLET DAILY   90 tablet   1   .  cloNIDine (CATAPRES) 0.3 MG tablet      TAKE 1 TABLET NIGHTLY AT   BEDTIME   90 tablet   1   . esomeprazole (NEXIUM) 40 MG capsule   Oral   Take 40 mg by mouth daily before breakfast.         . famotidine (PEPCID) 20 MG tablet   Oral   Take 1 tablet (20 mg total) by mouth 2 (two) times daily.   30 tablet   0   . HYDROcodone-acetaminophen  (NORCO) 10-325 MG per tablet      TAKE 1 TABLET BY MOUTH TWICE DAILY AS NEEDED FOR PAIN * MAX OF 8 TABS PER WEEK   30 tablet   0   . HYDROcodone-acetaminophen (NORCO/VICODIN) 5-325 MG per tablet   Oral   Take 1 tablet by mouth every 4 (four) hours as needed.   15 tablet   0   . predniSONE (DELTASONE) 10 MG tablet   Oral   Take 2 tablets (20 mg total) by mouth 2 (two) times daily.   20 tablet   0   . triamterene-hydrochlorothiazide (MAXZIDE-25) 37.5-25 MG per tablet   Oral   Take 1 each (1 tablet total) by mouth daily.   30 tablet   11    BP 131/93  Pulse 64  Temp(Src) 98 F (36.7 C) (Oral)  Resp 16  Ht 6' (1.829 m)  Wt 179 lb (81.194 kg)  BMI 24.27 kg/m2  SpO2 100% Physical Exam  Nursing note and vitals reviewed. Constitutional: He is oriented to person, place, and time. He appears well-developed and well-nourished. No distress.  HENT:  Head: Normocephalic and atraumatic.  Mouth/Throat: Oropharynx is clear and moist.  Eyes: EOM are normal. Pupils are equal, round, and reactive to light.  Neck: Normal range of motion, full passive range of motion without pain and phonation normal. Neck supple. No rigidity. No Brudzinski's sign and no Kernig's sign noted.  Cardiovascular: Normal rate, regular rhythm, normal heart sounds and intact distal pulses.   No murmur heard. Pulmonary/Chest: Effort normal and breath sounds normal. No respiratory distress.  Musculoskeletal: Normal range of motion.  Neurological: He is alert and oriented to person, place, and time. No cranial nerve deficit or sensory deficit. He exhibits normal muscle tone. Coordination and gait normal.  Reflex Scores:      Tricep reflexes are 2+ on the right side and 2+ on the left side.      Bicep reflexes are 2+ on the right side and 2+ on the left side. Skin: Skin is warm and dry.    ED Course   Procedures (including critical care time)  Labs Reviewed - No data to display No results found. No diagnosis  found.  MDM    Vitals stable,  Pt is non-toxic appearing.  No focal neuro or motor deficits, no meningeal signs.  Headache of gradual onset that is similar to previous.  Patient states that his neurologist is treating his recurrent headaches with fiorcet and that medication does not help control his headaches.    Patient has been here several times previously for headaches. Is non-toxic appearing, ambulates with a steady gait.  Appears stable for d/c.  Patient agrees to close f/u with PMD or to return here if the symptoms worsen.   Eboney Claybrook L. Vanessa Slayden, PA-C 05/06/13 2059

## 2013-05-07 NOTE — ED Provider Notes (Signed)
Medical screening examination/treatment/procedure(s) were performed by non-physician practitioner and as supervising physician I was immediately available for consultation/collaboration.   Mervin Kung, MD 05/07/13 [phone]

## 2013-05-29 ENCOUNTER — Telehealth: Payer: Self-pay

## 2013-05-29 NOTE — Telephone Encounter (Signed)
Has had a migraine for a week and he cannot get his pain meds from [facility]. They keep telling him they haven't got a fax from the pharmacy. He cannot work like this and they are not working with him at all. He cannot get a response from them regarding his pain meds. He refuses to go back to the ER but he cannot function like this without his meds with his head hurting so bad. Rates pain beyond 10. Wants to know if you can prescribe him some pain meds or get in touch with [place] office and see what is going on. [phone]

## 2013-05-30 NOTE — Telephone Encounter (Signed)
I have sent Dr Merlene Laughter a message. I suggest he also arrange to be seen by Dr Merlene Laughter on an as soon as possible visit , since he is having so many problems with pain still

## 2013-06-03 NOTE — Telephone Encounter (Signed)
Error message that the person I was calling was not accepting calls at this time. Will make aware if he calls back

## 2013-07-01 ENCOUNTER — Other Ambulatory Visit: Payer: Self-pay

## 2013-07-01 MED ORDER — CLONIDINE HCL 0.3 MG PO TABS: ORAL_TABLET | ORAL | Status: DC

## 2013-07-04 ENCOUNTER — Other Ambulatory Visit: Payer: Self-pay

## 2013-07-04 MED ORDER — CLONIDINE HCL 0.3 MG PO TABS: ORAL_TABLET | ORAL | Status: DC

## 2013-08-01 ENCOUNTER — Ambulatory Visit: Payer: Managed Care, Other (non HMO) | Admitting: Family Medicine

## 2013-08-30 ENCOUNTER — Telehealth: Payer: Self-pay | Admitting: Family Medicine

## 2013-08-30 ENCOUNTER — Other Ambulatory Visit: Payer: Self-pay

## 2013-08-30 MED ORDER — AMLODIPINE-OLMESARTAN 10-40 MG PO TABS: ORAL_TABLET | ORAL | Status: DC

## 2013-08-30 NOTE — Telephone Encounter (Signed)
Patient needs appointment.  Will call and notify as well as send a letter.

## 2013-09-02 NOTE — Telephone Encounter (Signed)
Med refilled.  Appointment rescheduled.

## 2013-09-24 ENCOUNTER — Telehealth: Payer: Self-pay | Admitting: Family Medicine

## 2013-09-24 NOTE — Telephone Encounter (Signed)
Called [facility] pharmacy and confirmed that patient filled in Nov.

## 2013-09-24 NOTE — Telephone Encounter (Signed)
Will find out if patient is out of med.    Will hold out until 1/7 ov if he is not

## 2013-09-25 ENCOUNTER — Inpatient Hospital Stay (HOSPITAL_COMMUNITY)
Admission: EM | Admit: 2013-09-25 | Discharge: 2013-09-26 | DRG: 497 | Disposition: A | Discharge status: Home / Self Care | Payer: Worker's Compensation | Attending: Orthopedic Surgery | Admitting: Orthopedic Surgery

## 2013-09-25 ENCOUNTER — Encounter (HOSPITAL_COMMUNITY): Payer: Self-pay | Admitting: Emergency Medicine

## 2013-09-25 ENCOUNTER — Encounter (INDEPENDENT_AMBULATORY_CARE_PROVIDER_SITE_OTHER): Payer: Self-pay

## 2013-09-25 ENCOUNTER — Emergency Department (HOSPITAL_COMMUNITY): Payer: Worker's Compensation

## 2013-09-25 ENCOUNTER — Ambulatory Visit (INDEPENDENT_AMBULATORY_CARE_PROVIDER_SITE_OTHER): Payer: Managed Care, Other (non HMO) | Admitting: Family Medicine

## 2013-09-25 ENCOUNTER — Other Ambulatory Visit: Payer: Self-pay | Admitting: Family Medicine

## 2013-09-25 ENCOUNTER — Encounter: Payer: Self-pay | Admitting: Family Medicine

## 2013-09-25 VITALS — BP 140/90 | HR 70 | Resp 16 | Ht 72.5 in | Wt 189.4 lb

## 2013-09-25 DIAGNOSIS — Z13220 Encounter for screening for lipoid disorders: Secondary | ICD-10-CM

## 2013-09-25 DIAGNOSIS — Z87891 Personal history of nicotine dependence: Secondary | ICD-10-CM

## 2013-09-25 DIAGNOSIS — K219 Gastro-esophageal reflux disease without esophagitis: Secondary | ICD-10-CM | POA: Diagnosis present

## 2013-09-25 DIAGNOSIS — I10 Essential (primary) hypertension: Secondary | ICD-10-CM | POA: Diagnosis present

## 2013-09-25 DIAGNOSIS — Z125 Encounter for screening for malignant neoplasm of prostate: Secondary | ICD-10-CM

## 2013-09-25 DIAGNOSIS — M549 Dorsalgia, unspecified: Secondary | ICD-10-CM | POA: Diagnosis present

## 2013-09-25 DIAGNOSIS — S61209A Unspecified open wound of unspecified finger without damage to nail, initial encounter: Secondary | ICD-10-CM

## 2013-09-25 DIAGNOSIS — S5400XA Injury of ulnar nerve at forearm level, unspecified arm, initial encounter: Secondary | ICD-10-CM | POA: Diagnosis present

## 2013-09-25 DIAGNOSIS — S62639B Displaced fracture of distal phalanx of unspecified finger, initial encounter for open fracture: Principal | ICD-10-CM | POA: Diagnosis present

## 2013-09-25 DIAGNOSIS — Y990 Civilian activity done for income or pay: Secondary | ICD-10-CM

## 2013-09-25 DIAGNOSIS — Z79899 Other long term (current) drug therapy: Secondary | ICD-10-CM

## 2013-09-25 DIAGNOSIS — N289 Disorder of kidney and ureter, unspecified: Secondary | ICD-10-CM

## 2013-09-25 DIAGNOSIS — E785 Hyperlipidemia, unspecified: Secondary | ICD-10-CM | POA: Diagnosis present

## 2013-09-25 DIAGNOSIS — X500XXA Overexertion from strenuous movement or load, initial encounter: Secondary | ICD-10-CM | POA: Diagnosis present

## 2013-09-25 DIAGNOSIS — G8929 Other chronic pain: Secondary | ICD-10-CM | POA: Diagnosis present

## 2013-09-25 DIAGNOSIS — R51 Headache: Secondary | ICD-10-CM

## 2013-09-25 DIAGNOSIS — Y9289 Other specified places as the place of occurrence of the external cause: Secondary | ICD-10-CM

## 2013-09-25 HISTORY — PX: FINGER DEBRIDEMENT: SHX1634

## 2013-09-25 MED ORDER — FENTANYL CITRATE 0.05 MG/ML IJ SOLN
50.0000 ug | Freq: Once | INTRAMUSCULAR | Status: AC
  Administered 2013-09-25: 50 ug via INTRAVENOUS

## 2013-09-25 MED ORDER — BUPIVACAINE HCL (PF) 0.5 % IJ SOLN
30.0000 mL | Freq: Once | INTRAMUSCULAR | Status: AC
  Administered 2013-09-25: 30 mL

## 2013-09-25 MED ORDER — FENTANYL CITRATE 0.05 MG/ML IJ SOLN: 50.0000 ug | Freq: Once | INTRAMUSCULAR | Status: AC

## 2013-09-25 MED ORDER — CEFAZOLIN SODIUM 1-5 GM-% IV SOLN
1.0000 g | Freq: Once | INTRAVENOUS | Status: AC
  Administered 2013-09-25: 1 g via INTRAVENOUS
  Filled 2013-09-25: qty 50

## 2013-09-25 MED ORDER — TETANUS-DIPHTH-ACELL PERTUSSIS 5-2.5-18.5 LF-MCG/0.5 IM SUSP
0.5000 mL | Freq: Once | INTRAMUSCULAR | Status: AC
  Administered 2013-09-25: 0.5 mL via INTRAMUSCULAR
  Filled 2013-09-25: qty 0.5

## 2013-09-25 MED ORDER — HYDROMORPHONE HCL PF 1 MG/ML IJ SOLN
INTRAMUSCULAR | Status: AC
  Filled 2013-09-25: qty 1

## 2013-09-25 MED ORDER — ONDANSETRON HCL 4 MG/2ML IJ SOLN: 4.0000 mg | Freq: Once | INTRAMUSCULAR | Status: AC

## 2013-09-25 MED ORDER — HYDROMORPHONE HCL PF 1 MG/ML IJ SOLN
1.0000 mg | Freq: Once | INTRAMUSCULAR | Status: AC
  Administered 2013-09-25: 1 mg via INTRAVENOUS

## 2013-09-25 MED ORDER — BUPIVACAINE HCL (PF) 0.5 % IJ SOLN: 10.0000 mL | Freq: Once | INTRAMUSCULAR | Status: DC

## 2013-09-25 MED ORDER — ONDANSETRON HCL 4 MG/2ML IJ SOLN
INTRAMUSCULAR | Status: AC
  Administered 2013-09-25: 4 mg via INTRAVENOUS
  Filled 2013-09-25: qty 2

## 2013-09-25 MED ORDER — LIDOCAINE HCL (PF) 2 % IJ SOLN
INTRAMUSCULAR | Status: AC
  Filled 2013-09-25: qty 10

## 2013-09-25 MED ORDER — FENTANYL CITRATE 0.05 MG/ML IJ SOLN
INTRAMUSCULAR | Status: AC
  Administered 2013-09-25: 50 ug via INTRAVENOUS
  Filled 2013-09-25: qty 2

## 2013-09-25 MED ORDER — BUPIVACAINE HCL (PF) 0.5 % IJ SOLN
INTRAMUSCULAR | Status: AC
  Filled 2013-09-25: qty 30

## 2013-09-25 MED ORDER — HYDROMORPHONE HCL PF 1 MG/ML IJ SOLN
1.0000 mg | Freq: Once | INTRAMUSCULAR | Status: AC
  Administered 2013-09-25 – 2013-09-26 (×2): 1 mg via INTRAVENOUS

## 2013-09-25 NOTE — ED Notes (Signed)
Describes a degloving injury to left thumb - drill got caught in his glove and twisted around until his thumb was torn almost all the way off.  Bleeding controlled at present. Severe pain

## 2013-09-25 NOTE — Progress Notes (Signed)
   Subjective:    Patient ID: [name], male    DOB: 12/03/1970, 43 y.o.   MRN: [id number]  HPI The PT is here for follow up and re-evaluation of chronic medical conditions, medication management and review of any available recent lab and radiology data.  Preventive health is updated, specifically  Cancer screening and Immunization.   Questions or concerns regarding consultations or procedures which the PT has had in the interim are  Addressed.States headache management is better now under the care of neurology, less time out from work and Ed visits. The PT denies any adverse reactions to current medications since the last visit.  There are no new concerns.  There are no specific complaints Needs updated eval of BP and lipids for coverage for insurance. States he is out of one of his BP meds, clonidine , in the past several days      Review of Systems See HPI Denies recent fever or chills. Denies sinus pressure, nasal congestion, ear pain or sore throat. Denies chest congestion, productive cough or wheezing. Denies chest pains, palpitations and leg swelling Denies abdominal pain, nausea, vomiting,diarrhea or constipation.   Denies dysuria, frequency, hesitancy or incontinence. Denies joint pain, swelling and limitation in mobility. Denies headaches seizures, numbness, or tingling. Denies depression, anxiety or insomnia. Denies skin break down or rash.        Objective:   Physical Exam  Patient alert and oriented and in no cardiopulmonary distress.  HEENT: No facial asymmetry, EOMI, no sinus tenderness,  oropharynx pink and moist.  Neck supple no adenopathy.  Chest: Clear to auscultation bilaterally.  CVS: S1, S2 no murmurs, no S3.  ABD: Soft non tender. Bowel sounds normal.  Ext: No edema  MS: Adequate ROM spine, shoulders, hips and knees.  Skin: Intact, no ulcerations or rash noted.  Psych: Good eye contact, normal affect. Memory intact not anxious or  depressed appearing.  CNS: CN 2-12 intact, power, tone and sensation normal throughout.       Assessment & Plan:

## 2013-09-25 NOTE — ED Provider Notes (Signed)
CSN: [id number]     Arrival date & time 09/25/13  2123 History   First MD Initiated Contact with Patient 09/25/13 2208     Chief Complaint  Patient presents with  . Extremity Laceration    describes degloving injury to left thumb   (Consider location/radiation/quality/duration/timing/severity/associated sxs/prior Treatment) HPI Comments: [name] is a 43 y.o. Presenting with left thumb fracture and degloving injury which occurred while at work just prior to arrival.  A drill became caught in his glove, twisting it causing injury to the thumb.  He has severe pain in the finger with obvious open fracture with distal finger tuft nearly degloved.  He denies other injury.  He has had no treatments prior to arrival, other than direct pressure which has given some hemostasis.     The history is provided by the patient.    Past Medical History  Diagnosis Date  . Chronic back pain   . Abdominal tenderness   . Unspecified essential hypertension   . Headache(784.0)   . Headache(784.0)   . Essential hypertension, benign   . Duodenitis with bleeding 08/2008    with ulcer / admitted to [facility]   . GERD (gastroesophageal reflux disease)   . Weight gain     mild  . Hyperlipidemia     on medication  . Obesity, unspecified    Past Surgical History  Procedure Laterality Date  . Foot fracture surgery     Family History  Problem Relation Age of Onset  . Diabetes Mother   . Hypertension Mother   . Hypertension Father   . Arthritis Father   . Diabetes Father    History  Substance Use Topics  . Smoking status: Former Smoker -- 0.10 packs/day    Types: Cigarettes    Quit date: 05/28/2012  . Smokeless tobacco: Not on file  . Alcohol Use: No    Review of Systems  Constitutional: Negative for fever.  Musculoskeletal: Positive for arthralgias. Negative for myalgias.  Skin: Positive for wound.  Neurological: Negative for weakness and numbness.    Allergies  Bee venom;  Prochlorperazine edisylate; Cyclobenzaprine hcl; Aleve; Carbamazepine; Ibuprofen; Tylenol; and Other  Home Medications   Current Outpatient Rx  Name  Route  Sig  Dispense  Refill  . amLODipine-olmesartan (AZOR) 10-40 MG per tablet   Oral   Take 1 tablet by mouth every morning.         . butalbital-acetaminophen-caffeine (FIORICET, ESGIC) 50-325-40 MG per tablet   Oral   Take 1 tablet by mouth 2 (two) times daily as needed for headache or migraine.         . cloNIDine (CATAPRES) 0.3 MG tablet   Oral   Take 0.3 mg by mouth at bedtime.          BP 151/94  Pulse 74  Temp(Src) 98.2 F (36.8 C) (Oral)  Resp 16  Wt 189 lb (85.73 kg)  SpO2 96% Physical Exam  Constitutional: He appears well-developed and well-nourished.  HENT:  Head: Atraumatic.  Neck: Normal range of motion.  Cardiovascular:  Pulses equal bilaterally  Musculoskeletal: He exhibits tenderness.       Right shoulder: He exhibits deformity.  Refer to photos of injury.    Neurological: He is alert. He has normal strength. He displays normal reflexes. No sensory deficit.  Equal strength  Skin: Skin is warm and dry.  Psychiatric: He has a normal mood and affect.    ED Course  Procedures (including critical care  time) Labs Review Labs Reviewed - No data to display Imaging Review Dg Hand Complete Left  09/25/2013   CLINICAL DATA:  Degloving injury to left thumb  EXAM: LEFT HAND - COMPLETE 3+ VIEW  COMPARISON:  Prior radiograph from 07/10/2012  FINDINGS: Extensive soft tissue laceration with injury is seen at the left from. There is a comminuted fracture of the left 1st distal phalanx with approximately 1 cm of radial displacement. Additional fracture fragment is seen at the radial aspect of the 1st IP joint. The bone appears exposed to the external environment. There is extensive soft tissue swelling without definite radiopaque foreign body. The 1st MCP joint remains approximated.  Remainder of the left hand is  intact without evidence of acute traumatic injury.  IMPRESSION: Soft-tissue degloving injury with acute open comminuted fracture of the 1st distal phalanx with radial displacement.   Electronically Signed   By: Jeannine Boga M.D.   On: 09/25/2013 23:03    EKG Interpretation   None       MDM   1. Degloving injury of finger, initial encounter      Pt was given a digital block volar proximal thumb after betadine cleansing using marcaine 0.5% 2 cc with moderate improvement in pain, but still with mild throbbing distally.    Pt was seen by Dr. Tomi Bamberger.  Spoke to Dr. Aline Brochure who deferred to Hand specialist for further care.    Evalee Jefferson, PA-C 09/25/13 2346

## 2013-09-25 NOTE — ED Provider Notes (Addendum)
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 Patient is right handed. 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   Patient's hand is normal with intact proximal thumb tissue however the apparent distal proximal phalanx and middle  phalanx are exposed with the fleshy portion of the distal thumb separated from it.  23:15 Dr Grandville Silos will look at films and call me back  23:25 Dr Grandville Silos, accepts in transfer to [facility] to go to the OR through the ED.            Medical screening examination/treatment/procedure(s) were conducted as a shared visit with non-physician practitioner(s) and myself.  I personally evaluated the patient during the encounter.  EKG Interpretation   None        Rolland Porter, MD, Abram Sander   Janice Norrie, MD 09/25/13 [place], MD 09/25/13 [id number]  Janice Norrie, MD 09/26/13 [id number]

## 2013-09-25 NOTE — Patient Instructions (Signed)
F/u in 5.5 month, call if you need me before   Blood pressure is high today, you need to take medication every day    Fasting lipid, chem 7, CBC, PSA, in am

## 2013-09-26 ENCOUNTER — Inpatient Hospital Stay (HOSPITAL_COMMUNITY): Payer: Worker's Compensation | Admitting: Certified Registered"

## 2013-09-26 ENCOUNTER — Inpatient Hospital Stay (HOSPITAL_COMMUNITY): Payer: Worker's Compensation

## 2013-09-26 ENCOUNTER — Encounter (HOSPITAL_COMMUNITY): Payer: Self-pay | Admitting: Certified Registered"

## 2013-09-26 ENCOUNTER — Encounter (HOSPITAL_COMMUNITY): Payer: Worker's Compensation | Admitting: Certified Registered"

## 2013-09-26 ENCOUNTER — Encounter (HOSPITAL_COMMUNITY)
Admission: EM | Disposition: A | Discharge status: Home / Self Care | Payer: Self-pay | Source: Home / Self Care | Attending: Orthopedic Surgery

## 2013-09-26 HISTORY — PX: AMPUTATION: SHX166

## 2013-09-26 SURGERY — AMPUTATION DIGIT
Anesthesia: General | Site: Thumb | Laterality: Left

## 2013-09-26 MED ORDER — LIDOCAINE HCL (CARDIAC) 20 MG/ML IV SOLN
INTRAVENOUS | Status: DC | PRN
  Administered 2013-09-26: 100 mg via INTRAVENOUS

## 2013-09-26 MED ORDER — LIDOCAINE HCL 0.5 % IJ SOLN
INTRAMUSCULAR | Status: DC | PRN
  Administered 2013-09-26: 10 mL via INTRADERMAL

## 2013-09-26 MED ORDER — SUCCINYLCHOLINE CHLORIDE 20 MG/ML IJ SOLN
INTRAMUSCULAR | Status: DC | PRN
  Administered 2013-09-26: 100 mg via INTRAVENOUS

## 2013-09-26 MED ORDER — ONDANSETRON HCL 4 MG PO TABS
4.0000 mg | ORAL_TABLET | Freq: Four times a day (QID) | ORAL | Status: DC | PRN
  Administered 2013-09-26: 4 mg via ORAL
  Filled 2013-09-26: qty 1

## 2013-09-26 MED ORDER — HYDROMORPHONE HCL PF 1 MG/ML IJ SOLN
INTRAMUSCULAR | Status: AC
  Filled 2013-09-26: qty 1

## 2013-09-26 MED ORDER — HYDROMORPHONE HCL PF 1 MG/ML IJ SOLN
INTRAMUSCULAR | Status: AC
  Administered 2013-09-26: 1 mg via INTRAVENOUS
  Filled 2013-09-26: qty 1

## 2013-09-26 MED ORDER — CEFAZOLIN SODIUM 1-5 GM-% IV SOLN
INTRAVENOUS | Status: AC
  Filled 2013-09-26: qty 50

## 2013-09-26 MED ORDER — BUPIVACAINE-EPINEPHRINE (PF) 0.25% -1:200000 IJ SOLN
INTRAMUSCULAR | Status: AC
  Filled 2013-09-26: qty 30

## 2013-09-26 MED ORDER — OXYCODONE HCL 5 MG PO TABS
ORAL_TABLET | ORAL | Status: AC
  Filled 2013-09-26: qty 1

## 2013-09-26 MED ORDER — 0.9 % SODIUM CHLORIDE (POUR BTL) OPTIME
TOPICAL | Status: DC | PRN
  Administered 2013-09-26: 2000 mL

## 2013-09-26 MED ORDER — ONDANSETRON HCL 4 MG/2ML IJ SOLN
4.0000 mg | Freq: Four times a day (QID) | INTRAMUSCULAR | Status: DC | PRN
  Administered 2013-09-26: 4 mg via INTRAVENOUS
  Filled 2013-09-26: qty 2

## 2013-09-26 MED ORDER — PROMETHAZINE HCL 25 MG PO TABS: 25.0000 mg | ORAL_TABLET | Freq: Four times a day (QID) | ORAL | Status: DC | PRN

## 2013-09-26 MED ORDER — OXYCODONE HCL 5 MG PO TABS: 5.0000 mg | ORAL_TABLET | ORAL | Status: DC | PRN

## 2013-09-26 MED ORDER — ARTIFICIAL TEARS OP OINT
TOPICAL_OINTMENT | OPHTHALMIC | Status: DC | PRN
  Administered 2013-09-26: 1 via OPHTHALMIC

## 2013-09-26 MED ORDER — OXYCODONE HCL 5 MG PO TABS
5.0000 mg | ORAL_TABLET | ORAL | Status: DC | PRN
  Administered 2013-09-26: 10 mg via ORAL
  Filled 2013-09-26: qty 2

## 2013-09-26 MED ORDER — FENTANYL CITRATE 0.05 MG/ML IJ SOLN
INTRAMUSCULAR | Status: DC | PRN
  Administered 2013-09-26: 50 ug via INTRAVENOUS
  Administered 2013-09-26 (×2): 100 ug via INTRAVENOUS

## 2013-09-26 MED ORDER — IRBESARTAN 300 MG PO TABS
300.0000 mg | ORAL_TABLET | Freq: Every day | ORAL | Status: DC
  Administered 2013-09-26: 300 mg via ORAL
  Filled 2013-09-26: qty 1

## 2013-09-26 MED ORDER — LIDOCAINE HCL (PF) 0.5 % IJ SOLN
INTRAMUSCULAR | Status: AC
  Filled 2013-09-26: qty 50

## 2013-09-26 MED ORDER — PROPOFOL 10 MG/ML IV BOLUS
INTRAVENOUS | Status: DC | PRN
Start: 2013-09-26 — End: 2013-09-26
  Administered 2013-09-26: 30 mg via INTRAVENOUS
  Administered 2013-09-26: 200 mg via INTRAVENOUS

## 2013-09-26 MED ORDER — OXYCODONE HCL 5 MG PO TABS: 5.0000 mg | ORAL_TABLET | Freq: Once | ORAL | Status: DC | PRN

## 2013-09-26 MED ORDER — HYDROMORPHONE HCL PF 1 MG/ML IJ SOLN
0.2500 mg | INTRAMUSCULAR | Status: DC | PRN
  Administered 2013-09-26: 0.5 mg via INTRAVENOUS

## 2013-09-26 MED ORDER — ONDANSETRON HCL 4 MG/2ML IJ SOLN
INTRAMUSCULAR | Status: AC
  Filled 2013-09-26: qty 2

## 2013-09-26 MED ORDER — AMLODIPINE-OLMESARTAN 10-40 MG PO TABS: 1.0000 | ORAL_TABLET | Freq: Every morning | ORAL | Status: DC

## 2013-09-26 MED ORDER — AMLODIPINE BESYLATE 10 MG PO TABS
10.0000 mg | ORAL_TABLET | Freq: Every day | ORAL | Status: DC
  Administered 2013-09-26: 10 mg via ORAL
  Filled 2013-09-26: qty 1

## 2013-09-26 MED ORDER — ONDANSETRON HCL 4 MG/2ML IJ SOLN
4.0000 mg | Freq: Four times a day (QID) | INTRAMUSCULAR | Status: AC | PRN
  Administered 2013-09-26: 4 mg via INTRAVENOUS

## 2013-09-26 MED ORDER — LIDOCAINE HCL (PF) 1 % IJ SOLN
INTRAMUSCULAR | Status: AC
  Filled 2013-09-26: qty 30

## 2013-09-26 MED ORDER — CEFAZOLIN SODIUM-DEXTROSE 2-3 GM-% IV SOLR
2.0000 g | Freq: Four times a day (QID) | INTRAVENOUS | Status: AC
  Administered 2013-09-26 (×2): 2 g via INTRAVENOUS
  Filled 2013-09-26 (×2): qty 50

## 2013-09-26 MED ORDER — AMOXICILLIN-POT CLAVULANATE 875-125 MG PO TABS: 1.0000 | ORAL_TABLET | Freq: Two times a day (BID) | ORAL | Status: DC

## 2013-09-26 MED ORDER — BUPIVACAINE-EPINEPHRINE 0.25% -1:200000 IJ SOLN
INTRAMUSCULAR | Status: DC | PRN
  Administered 2013-09-26: 30 mL

## 2013-09-26 MED ORDER — CLONIDINE HCL 0.3 MG PO TABS
0.3000 mg | ORAL_TABLET | Freq: Every day | ORAL | Status: DC
  Administered 2013-09-26: 0.3 mg via ORAL
  Filled 2013-09-26: qty 1

## 2013-09-26 MED ORDER — LACTATED RINGERS IV SOLN
INTRAVENOUS | Status: DC | PRN
  Administered 2013-09-26 (×2): via INTRAVENOUS

## 2013-09-26 MED ORDER — CEFAZOLIN SODIUM 1-5 GM-% IV SOLN
INTRAVENOUS | Status: DC | PRN
  Administered 2013-09-26: 1 g via INTRAVENOUS

## 2013-09-26 MED ORDER — OXYCODONE HCL 5 MG/5ML PO SOLN: 5.0000 mg | Freq: Once | ORAL | Status: DC | PRN

## 2013-09-26 MED ORDER — MIDAZOLAM HCL 5 MG/5ML IJ SOLN
INTRAMUSCULAR | Status: DC | PRN
  Administered 2013-09-26: 2 mg via INTRAVENOUS

## 2013-09-26 MED ORDER — HYDRALAZINE HCL 20 MG/ML IJ SOLN
INTRAMUSCULAR | Status: DC | PRN
  Administered 2013-09-26 (×2): 4 mg via INTRAVENOUS

## 2013-09-26 MED ORDER — HYDROMORPHONE HCL PF 1 MG/ML IJ SOLN
0.5000 mg | INTRAMUSCULAR | Status: DC | PRN
  Administered 2013-09-26: 1 mg via INTRAVENOUS
  Filled 2013-09-26: qty 1

## 2013-09-26 MED ORDER — BUTALBITAL-APAP-CAFFEINE 50-325-40 MG PO TABS
1.0000 | ORAL_TABLET | Freq: Two times a day (BID) | ORAL | Status: DC | PRN
  Administered 2013-09-26: 1 via ORAL
  Filled 2013-09-26: qty 1

## 2013-09-26 SURGICAL SUPPLY — 56 items
BANDAGE COBAN STERILE 2 (GAUZE/BANDAGES/DRESSINGS) IMPLANT
BANDAGE CONFORM 2  STR LF (GAUZE/BANDAGES/DRESSINGS) IMPLANT
BANDAGE CONFORM 3  STR LF (GAUZE/BANDAGES/DRESSINGS) ×1 IMPLANT
BANDAGE GAUZE ELAST BULKY 4 IN (GAUZE/BANDAGES/DRESSINGS) ×4 IMPLANT
BLADE AVERAGE 25X9 (BLADE) IMPLANT
BNDG CMPR 9X4 STRL LF SNTH (GAUZE/BANDAGES/DRESSINGS)
BNDG COHESIVE 4X5 TAN NS LF (GAUZE/BANDAGES/DRESSINGS) IMPLANT
BNDG COHESIVE 4X5 TAN STRL (GAUZE/BANDAGES/DRESSINGS) ×1 IMPLANT
BNDG ESMARK 4X9 LF (GAUZE/BANDAGES/DRESSINGS) IMPLANT
BNDG GAUZE ELAST 4 BULKY (GAUZE/BANDAGES/DRESSINGS) ×2 IMPLANT
CHLORAPREP W/TINT 26ML (MISCELLANEOUS) ×2 IMPLANT
CORDS BIPOLAR (ELECTRODE) ×2 IMPLANT
COVER SURGICAL LIGHT HANDLE (MISCELLANEOUS) ×1 IMPLANT
CUFF TOURNIQUET SINGLE 18IN (TOURNIQUET CUFF) ×1 IMPLANT
DRAPE C-ARM 42X72 X-RAY (DRAPES) ×1 IMPLANT
DRAPE SURG 17X23 STRL (DRAPES) ×2 IMPLANT
DRSG EMULSION OIL 3X3 NADH (GAUZE/BANDAGES/DRESSINGS) ×2 IMPLANT
ELECT REM PT RETURN 9FT ADLT (ELECTROSURGICAL) ×2
ELECTRODE REM PT RTRN 9FT ADLT (ELECTROSURGICAL) ×1 IMPLANT
GAUZE XEROFORM 1X8 LF (GAUZE/BANDAGES/DRESSINGS) ×2 IMPLANT
GLOVE BIO SURGEON STRL SZ7 (GLOVE) ×1 IMPLANT
GLOVE BIO SURGEON STRL SZ7.5 (GLOVE) ×2 IMPLANT
GLOVE BIOGEL PI IND STRL 7.5 (GLOVE) IMPLANT
GLOVE BIOGEL PI IND STRL 8 (GLOVE) ×1 IMPLANT
GLOVE BIOGEL PI INDICATOR 7.5 (GLOVE) ×1
GLOVE BIOGEL PI INDICATOR 8 (GLOVE) ×2
GLOVE SURG SS PI 7.0 STRL IVOR (GLOVE) ×1 IMPLANT
GOWN PREVENTION PLUS XLARGE (GOWN DISPOSABLE) ×2 IMPLANT
GUIDE NERVE NEURAGEN 1.5MM (Tissue) ×1 IMPLANT
K-WIRE DBL TROCAR .045X4 ×4 IMPLANT
KIT BASIN OR (CUSTOM PROCEDURE TRAY) ×2 IMPLANT
KWIRE DBL TROCAR .045X4 IMPLANT
NDL HYPO 25GX1X1/2 BEV (NEEDLE) IMPLANT
NDL HYPO 25X1 1.5 SAFETY (NEEDLE) IMPLANT
NEEDLE HYPO 25GX1X1/2 BEV (NEEDLE) ×2 IMPLANT
NEEDLE HYPO 25X1 1.5 SAFETY (NEEDLE) IMPLANT
PACK ORTHO EXTREMITY (CUSTOM PROCEDURE TRAY) ×2 IMPLANT
PAD CAST 4YDX4 CTTN HI CHSV (CAST SUPPLIES) IMPLANT
PADDING CAST ABS 4INX4YD NS (CAST SUPPLIES) ×1
PADDING CAST ABS COTTON 4X4 ST (CAST SUPPLIES) ×1 IMPLANT
PADDING CAST COTTON 4X4 STRL (CAST SUPPLIES) ×2
PENCIL BUTTON HOLSTER BLD 10FT (ELECTRODE) IMPLANT
SLING ARM FOAM STRAP LRG (SOFTGOODS) ×1 IMPLANT
SPONGE GAUZE 4X4 12PLY (GAUZE/BANDAGES/DRESSINGS) ×2 IMPLANT
SUCTION FRAZIER TIP 10 FR DISP (SUCTIONS) ×1 IMPLANT
SUT CHROMIC 7 0 TG140 8 (SUTURE) ×1 IMPLANT
SUT ETHILON 4 0 PS 2 18 (SUTURE) IMPLANT
SUT ETHILON 8 0 BV130 4 (SUTURE) ×1 IMPLANT
SUT ETHILON 8 0 TG100 8 (SUTURE) ×1 IMPLANT
SUT ETHILON 9 0 BV130 4 (SUTURE) ×1 IMPLANT
SUT MNCRL AB 4-0 PS2 18 (SUTURE) IMPLANT
SUT VICRYL RAPIDE 4/0 PS 2 (SUTURE) ×2 IMPLANT
SYRINGE 10CC LL (SYRINGE) ×1 IMPLANT
TOWEL OR 17X24 6PK STRL BLUE (TOWEL DISPOSABLE) ×2 IMPLANT
TUBE CONNECTING 12X1/4 (SUCTIONS) ×1 IMPLANT
UNDERPAD 30X30 INCONTINENT (UNDERPADS AND DIAPERS) ×2 IMPLANT

## 2013-09-26 NOTE — Progress Notes (Signed)
Subjective: Pt with N/V , he associates it to increased BP and he suffers at home with similar bouts.  He has some phenergan at home already.  He desires to go home.   Objective: Vital signs in last 24 hours: Temp:  [96.8 F (36 C)-98.5 F (36.9 C)] 98.5 F (36.9 C) (01/08 1321) Pulse Rate:  [63-117] 63 (01/08 1321) Resp:  [12-24] 16 (01/08 0536) BP: (128-194)/(77-116) 128/77 mmHg (01/08 1321) SpO2:  [96 %-100 %] 98 % (01/08 1321) Weight:  [85.73 kg (189 lb)] 85.73 kg (189 lb) (01/07 2127)  Intake/Output from previous day: 01/07 0701 - 01/08 0700 In: 1200 [I.V.:1200] Out: 500 [Urine:500] Intake/Output this shift: Total I/O In: 260 [Other:260] Out: 901 [Urine:900; Emesis/NG output:1]  No results found for this basename: HGB,  in the last 72 hours No results found for this basename: WBC, RBC, HCT, PLT,  in the last 72 hours No results found for this basename: NA, K, CL, CO2, BUN, CREATININE, GLUCOSE, CALCIUM,  in the last 72 hours No results found for this basename: LABPT, INR,  in the last 72 hours  thumb tip warm, good turgor, sensitive to pressure  Assessment/Plan: Will give oral phenergan here & d/c with Rx for such.   Drew Lips A. 09/26/2013, 5:19 PM

## 2013-09-26 NOTE — Discharge Summary (Signed)
Physician Discharge Summary  Patient ID: BRYHEEM LIGOCKI MRN: [id number] DOB/AGE: Dec 21, 1970 43 y.o.  Admit date: 09/25/2013 Discharge date: 09/26/2013  Admission Diagnoses: Left thumb degloving injury Discharge Diagnoses:  Active Problems:   Degloving injury of finger   Discharged Condition: good  Hospital Course: Admitted postop for pain control and 2 doses of IV antibiotics, to be discharged home the afternoon of admission.  Consults: None  Significant Diagnostic Studies: xrays of left thumb  Treatments: surgery to repair thumb on early morning 09-26-13  Discharge Exam: Blood pressure 156/101, pulse 65, temperature 98.2 F (36.8 C), temperature source Oral, resp. rate 16, weight 85.73 kg (189 lb), SpO2 98.00%.  Disposition: 01-Home or Self Care   Future Appointments Provider Department Dept Phone   04/02/2014 8:30 AM Fayrene Helper, MD [facility] [phone]       Medication List         amoxicillin-clavulanate 875-125 MG per tablet  Commonly known as:  AUGMENTIN  Take 1 tablet by mouth 2 (two) times daily.     AZOR 10-40 MG per tablet  Generic drug:  amLODipine-olmesartan  Take 1 tablet by mouth every morning.     butalbital-acetaminophen-caffeine 50-325-40 MG per tablet  Commonly known as:  FIORICET, ESGIC  Take 1 tablet by mouth 2 (two) times daily as needed for headache or migraine.     cloNIDine 0.3 MG tablet  Commonly known as:  CATAPRES  Take 0.3 mg by mouth at bedtime.     oxyCODONE 5 MG immediate release tablet  Commonly known as:  ROXICODONE  Take 1-2 tablets (5-10 mg total) by mouth every 4 (four) hours as needed for severe pain.           Follow-up Information   Follow up with Jolyn Nap., MD. (This Friday at 12:00 noon)    Specialty:  Orthopedic Surgery   Contact information:   [place] [address] [phone]       Signed: [place], Gyasi Hazzard A. 09/26/2013, 8:50 AM

## 2013-09-26 NOTE — Discharge Instructions (Addendum)
Discharge Instructions   You have a dressing on and pins in your thumb. Move your fingers as much as possible, making a full fist and fully opening the fist--don't try to move the thumb Elevate your hand to reduce pain & swelling of the digits.  Ice over the operative site may be helpful to reduce pain & swelling.  DO NOT USE HEAT. Pain medicine has been prescribed for you.  Use your medicine as needed over the first 48 hours, and then you can begin to taper your use.  You may use Tylenol in place of your prescribed pain medication, but not IN ADDITION to it. Leave the dressing in place until you return to our office.  You may shower, but keep the bandage clean & dry.  You may drive a car when you are off of prescription pain medications and can safely control your vehicle with both hands. Your first followup appointment is at my office at 12:00 on Friday this week.   Please call [phone] during normal business hours or [phone] after hours for any problems. Including the following:  - excessive redness of the incisions - drainage for more than 4 days - fever of more than 101.5 F  *Please note that pain medications will not be refilled after hours or on weekends.

## 2013-09-26 NOTE — ED Provider Notes (Signed)
See prior note   Janice Norrie, MD 09/26/13 [phone]

## 2013-09-26 NOTE — Op Note (Signed)
09/25/2013 - 09/26/2013  2:08 AM  PATIENT:  [name]  43 y.o. male  PRE-OPERATIVE DIAGNOSIS:  Complex partial degloving injury to left thumb with distal phalanx fracture  POST-OPERATIVE DIAGNOSIS:  Same  PROCEDURE:   1. Debridement of left thumbopen fracture to include skin, subcutaneous tissues, and bone    2. ORIF of left thumb distal phalanx fracture    3. Avulsion of left thumb nail plate    4. Left thumb nailbed repair    5. Left thumb UDN repair via Neuragen 1.5 mm conduit    6. Left thumb intermediate level skin repair measuring 3 cm  SURGEON: Rayvon Char. Grandville Silos, MD  PHYSICIAN ASSISTANT: None  ANESTHESIA:  general  SPECIMENS:  None  DRAINS:   None  PREOPERATIVE INDICATIONS:  [name] is a  43 y.o. male with a complex incomplete degloving injury to the left thumb.  The risks benefits and alternatives were discussed with the patient preoperatively including but not limited to the risks of infection, bleeding, nerve injury, cardiopulmonary complications, the need for revision surgery, among others, and the patient verbalized understanding and consented to proceed.  OPERATIVE IMPLANTS: 0.045 inch K wires x2  OPERATIVE PROCEDURE:  After receiving prophylactic antibiotics, the patient was escorted to the operative theatre and placed in a supine position. General anesthesia was administered. A surgical "time-out" was performed during which the planned procedure, proposed operative site, and the correct patient identity were compared to the operative consent and agreement confirmed by the circulating nurse according to current facility policy.  Following application of a tourniquet to the operative extremity, the exposed skin was prepped with Betadine and draped in the usual sterile fashion.  The Tourniquet was not inflated.  The injury was initially copiously irrigated and the skin edges debrided, as was the exposed tendon and bone. The nail plate was carefully avulsed  in order to gain access to the underlying nailbed laceration.The skeletal elements were then placed within the soft tissue sleeve.    2 different 0.045 inch K wires were driven through the small bits of bone at the end of the digit on into the distal phalanx to help provide some more rigid stabilization of the soft tissue sleeve and those bony elements. They were clipped but not yet bent over.  A longitudinal incision was made on the proximal Stump, overlying the ulnar digital vessel and nerve. The radial side appeared actually to be intact, with a complete radial and dorsal skin bridge. There appeared to be bleeding from the distal soft tissue component. The tourniquet was inflated and the ulnar neurovascular structures evaluated. Her was a large zone of injury to the artery. The nerve and the artery actually were severed at different levels, the nerve more distal than the artery.  The nerve was debrided back to more healthy nerve at each and and repaired with a 1.5 mm Neuragen conduit in order to bridge a 1-1.5 cm gap.  This was done with 4 power loupe magnification and 8-0 nylon suture. Tourniquet was released, and the fitness for repair of the artery was assessed. Good pulsatile flow could not be obtained in the artery and there appeared to be a wide zone of injury with the ribbon sign on both sides. Decision was made not to address this portion of the injury further.  The wound was again copiously irrigated and then the soft tissues closed with 4-0 Vicryl Rapide interrupted sutures, the nail bed repaired with 7-0 chromic suture in interrupted fashion  and the pins were bent over and clipped overlying the nail bed.  Half percent plain Marcaine was instilled at the base of the digit as well as a portion at the wrist for a median nerve block.  The tip of the thumb appeared to have decent turgor and bled. A bulky dressing was applied without plaster component, without constricting tightness, and he was taken to  the recovery room in stable condition, breathing spontaneously.  DISPOSITION:  The patient will be transferred to the floor for observation to complete 3 doses of antibiotics, discharged home, with followup on Friday.

## 2013-09-26 NOTE — Anesthesia Preprocedure Evaluation (Addendum)
Anesthesia Evaluation  Patient identified by MRN, date of birth, ID band Patient awake    Reviewed: Allergy & Precautions, H&P , NPO status , Patient's Chart, lab work & pertinent test results  Airway Mallampati: II  Neck ROM: full    Dental   Pulmonary former smoker,          Cardiovascular hypertension, Pt. on medications  Non-compliant with anti-hypertensives   Neuro/Psych  Headaches,    GI/Hepatic PUD, GERD-  ,  Endo/Other    Renal/GU      Musculoskeletal   Abdominal   Peds  Hematology   Anesthesia Other Findings Degloving injury left hand/ thumb  Reproductive/Obstetrics                          Anesthesia Physical Anesthesia Plan  ASA: II  Anesthesia Plan: General   Post-op Pain Management:    Induction: Intravenous, Rapid sequence and Cricoid pressure planned  Airway Management Planned: Oral ETT  Additional Equipment:   Intra-op Plan:   Post-operative Plan: Extubation in OR  Informed Consent: I have reviewed the patients History and Physical, chart, labs and discussed the procedure including the risks, benefits and alternatives for the proposed anesthesia with the patient or authorized representative who has indicated his/her understanding and acceptance.     Plan Discussed with: CRNA, Anesthesiologist and Surgeon  Anesthesia Plan Comments:         Anesthesia Quick Evaluation

## 2013-09-26 NOTE — Progress Notes (Signed)
Pt's nausea is under control with po Zofran. Clonidine given for bp 160/100. Pt given the option to be discharged today or tomorrow, and he insists that he is ready to go today. Discharged home accompanied by wife and mother. Charlies Silvers, RN

## 2013-09-26 NOTE — Anesthesia Procedure Notes (Signed)
Procedure Name: Intubation Date/Time: 09/26/2013 2:15 AM Performed by: Lelon Perla A Pre-anesthesia Checklist: Patient identified, Emergency Drugs available, Suction available and Patient being monitored Patient Re-evaluated:Patient Re-evaluated prior to inductionOxygen Delivery Method: Circle system utilized Preoxygenation: Pre-oxygenation with 100% oxygen Intubation Type: IV induction, Rapid sequence and Cricoid Pressure applied Laryngoscope Size: Miller and 2 Grade View: Grade I Tube type: Oral Tube size: 7.5 mm Number of attempts: 1 Placement Confirmation: ETT inserted through vocal cords under direct vision and positive ETCO2 Secured at: 23 cm Tube secured with: Tape Dental Injury: Teeth and Oropharynx as per pre-operative assessment

## 2013-09-26 NOTE — H&P (Signed)
ORTHOPAEDIC CONSULTATION HISTORY & PHYSICAL REQUESTING PHYSICIAN: Jolyn Nap, MD  Chief Complaint: left thumb complex degloving injury  HPI: [name] is a 43 y.o. male who injured his left thumb at work in [place]. He was using a drill, and the glove on his left hand became caught, causing a twisting injury to the thumb. This resulted in a partial degloving injury to the thumb. He was evaluated at [facility], whereupon the emergency room physician consulted me. I evaluated his photographs and x-rays and directed that he be sent to [facility] for further evaluation and management.  Past Medical History  Diagnosis Date  . Chronic back pain   . Abdominal tenderness   . Unspecified essential hypertension   . Headache(784.0)   . Headache(784.0)   . Essential hypertension, benign   . Duodenitis with bleeding 08/2008    with ulcer / admitted to [facility]   . GERD (gastroesophageal reflux disease)   . Weight gain     mild  . Hyperlipidemia     on medication  . Obesity, unspecified    Past Surgical History  Procedure Laterality Date  . Foot fracture surgery     History   Social History  . Marital Status: Married    Spouse Name: N/A    Number of Children: 4  . Years of Education: N/A   Occupational History  . unemployed     Social History Main Topics  . Smoking status: Former Smoker -- 0.10 packs/day    Types: Cigarettes    Quit date: 05/28/2012  . Smokeless tobacco: None  . Alcohol Use: No  . Drug Use: No  . Sexual Activity: None   Other Topics Concern  . None   Social History Narrative  . None   Family History  Problem Relation Age of Onset  . Diabetes Mother   . Hypertension Mother   . Hypertension Father   . Arthritis Father   . Diabetes Father    Allergies  Allergen Reactions  . Bee Venom Anaphylaxis  . Prochlorperazine Edisylate Other (See Comments)    Reaction: Jittery,uncomfortable feeling  . Cyclobenzaprine  Hcl Other (See Comments)    REACTION: feel anxious, nausea  . Aleve [Naproxen Sodium] Nausea And Vomiting    States that this medication also causes abdominal pain  . Carbamazepine     REACTION: unknown reaction  . Ibuprofen Nausea And Vomiting    States that this medication also causes abdominal pain  . Tylenol [Acetaminophen] Nausea And Vomiting    Patient states that this medication also causes abdominal pain  . Other Nausea And Vomiting, Rash and Other (See Comments)    ALL MUSCLE RELAXANTS: states that they affect sciatic nerve, may cause nausea and vomiting, rash   Prior to Admission medications   Medication Sig Start Date End Date Taking? Authorizing Provider  amLODipine-olmesartan (AZOR) 10-40 MG per tablet Take 1 tablet by mouth every morning.   Yes Historical Provider, MD  butalbital-acetaminophen-caffeine (FIORICET, ESGIC) 50-325-40 MG per tablet Take 1 tablet by mouth 2 (two) times daily as needed for headache or migraine.   Yes Historical Provider, MD  cloNIDine (CATAPRES) 0.3 MG tablet Take 0.3 mg by mouth at bedtime.   Yes Historical Provider, MD   Dg Hand Complete Left  09/25/2013   CLINICAL DATA:  Degloving injury to left thumb  EXAM: LEFT HAND - COMPLETE 3+ VIEW  COMPARISON:  Prior radiograph from 07/10/2012  FINDINGS: Extensive soft tissue laceration with  injury is seen at the left from. There is a comminuted fracture of the left 1st distal phalanx with approximately 1 cm of radial displacement. Additional fracture fragment is seen at the radial aspect of the 1st IP joint. The bone appears exposed to the external environment. There is extensive soft tissue swelling without definite radiopaque foreign body. The 1st MCP joint remains approximated.  Remainder of the left hand is intact without evidence of acute traumatic injury.  IMPRESSION: Soft-tissue degloving injury with acute open comminuted fracture of the 1st distal phalanx with radial displacement.   Electronically Signed    By: Jeannine Boga M.D.   On: 09/25/2013 23:03    Positive ROS: All other systems have been reviewed and were otherwise negative with the exception of those mentioned in the HPI and as above.  Physical Exam: Vitals: Refer to EMR. Constitutional:  WD, WN, NAD HEENT:  NCAT, EOMI Neuro/Psych:  Alert & oriented to person, place, and time; appropriate mood & affect Lymphatic: No generalized UE edema or lymphadenopathy Extremities / MSK:  The extremities are normal with respect to appearance, ranges of motion, joint stability, muscle strength/tone, sensation, & perfusion except as otherwise noted:   The left thumb is wrapped in a dressing. Photographs indicated at 270 laceration around the radial and volar aspect of the soft tissues proximal to the IP joint, leaving about a centimeter and a half bridge of soft tissues. In addition the bones of the distal phalanx emanated from the laceration as the soft tissue Had come off the end of the phalanx, leaving the soft tissue Attached the remainder the body through the bridge but without encircling the bones of the distal phalanx  Assessment: Left thumb complex incomplete degloving injury  Plan: I discussed briefly the details of this injury with the patient and his wife. I indicated that I will evaluate this to a deeper level in the operating room under anesthesia and either attempt a repair versus a revision amputation, depending upon the status of the tissues involved. Informed consent document was executed. In addition, he indicated he would be kept in the hospital for a couple of doses of IV antibiotics and discharged later this evening.  Rayvon Char Grandville Silos, [address] [place], [place]  [id number] [phone]

## 2013-09-26 NOTE — Transfer of Care (Signed)
Immediate Anesthesia Transfer of Care Note  Patient: [name]  Procedure(s) Performed: Procedure(s): 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. (Left)  Patient Location: PACU  Anesthesia Type:General  Level of Consciousness: sedated  Airway & Oxygen Therapy: Patient Spontanous Breathing and Patient connected to nasal cannula oxygen  Post-op Assessment: Report given to PACU RN and Post -op Vital signs reviewed and stable  Post vital signs: Reviewed and stable  Complications: No apparent anesthesia complications

## 2013-09-27 NOTE — Anesthesia Postprocedure Evaluation (Signed)
Anesthesia Post Note  Patient: [name]  Procedure(s) Performed: Procedure(s) (LRB): 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. (Left)  Anesthesia type: General  Patient location: PACU  Post pain: Pain level controlled and Adequate analgesia  Post assessment: Post-op Vital signs reviewed, Patient's Cardiovascular Status Stable, Respiratory Function Stable, Patent Airway and Pain level controlled  Last Vitals:  Filed Vitals:   09/26/13 1707  BP: 162/100  Pulse:   Temp:   Resp:     Post vital signs: Reviewed and stable  Level of consciousness: awake, alert  and oriented  Complications: No apparent anesthesia complications

## 2013-09-29 NOTE — Assessment & Plan Note (Signed)
Likely due to stage 2 HTn uncontrolled for years. I again explained to pt the impoortance of blood pressure control with a target of less than or equal to 130/80 Updated lab to be obtained

## 2013-09-29 NOTE — Assessment & Plan Note (Signed)
states overall improvement in frequency and severity of episodes. Managed by neurology since 2014

## 2013-09-29 NOTE — Assessment & Plan Note (Signed)
Uncontrolled, out of medication x 1 , importance of med compliance stressed. DASH diet and commitment to daily physical activity for a minimum of 30 minutes discussed and encouraged, as a part of hypertension management. The importance of attaining a healthy weight is also discussed.

## 2013-09-30 ENCOUNTER — Telehealth: Payer: Self-pay

## 2013-09-30 ENCOUNTER — Encounter (HOSPITAL_COMMUNITY): Payer: Self-pay | Admitting: Orthopedic Surgery

## 2013-09-30 NOTE — Telephone Encounter (Signed)
Patient wanted to discuss side effects from tetanus.

## 2013-10-01 LAB — CBC
HCT: 37.1 % — ABNORMAL LOW (ref 39.0–52.0)
HEMOGLOBIN: 12.4 g/dL — AB (ref 13.0–17.0)
MCH: 32.7 pg (ref 26.0–34.0)
MCHC: 33.4 g/dL (ref 30.0–36.0)
MCV: 97.9 fL (ref 78.0–100.0)
PLATELETS: 248 10*3/uL (ref 150–400)
RBC: 3.79 MIL/uL — AB (ref 4.22–5.81)
RDW: 12.2 % (ref 11.5–15.5)
WBC: 4.4 10*3/uL (ref 4.0–10.5)

## 2013-10-02 LAB — BASIC METABOLIC PANEL
BUN: 21 mg/dL (ref 6–23)
CALCIUM: 8.5 mg/dL (ref 8.4–10.5)
CO2: 30 meq/L (ref 19–32)
Chloride: 103 mEq/L (ref 96–112)
Creat: 1.6 mg/dL — ABNORMAL HIGH (ref 0.50–1.35)
GLUCOSE: 84 mg/dL (ref 70–99)
Potassium: 3.9 mEq/L (ref 3.5–5.3)
Sodium: 139 mEq/L (ref 135–145)

## 2013-10-02 LAB — LIPID PANEL
CHOLESTEROL: 101 mg/dL (ref 0–200)
HDL: 43 mg/dL (ref >39)
LDL Cholesterol: 46 mg/dL (ref 0–99)
Total CHOL/HDL Ratio: 2.3 Ratio
Triglycerides: 60 mg/dL (ref <150)
VLDL: 12 mg/dL (ref 0–40)

## 2013-10-02 LAB — PSA: PSA: 1.28 ng/mL (ref <=4.00)

## 2013-12-02 ENCOUNTER — Other Ambulatory Visit: Payer: Self-pay | Admitting: Family Medicine

## 2013-12-03 ENCOUNTER — Other Ambulatory Visit: Payer: Self-pay

## 2013-12-03 ENCOUNTER — Telehealth: Payer: Self-pay | Admitting: Family Medicine

## 2013-12-03 MED ORDER — AMLODIPINE-OLMESARTAN 10-40 MG PO TABS: 1.0000 | ORAL_TABLET | Freq: Every morning | ORAL | Status: DC

## 2013-12-03 MED ORDER — CLONIDINE HCL 0.3 MG PO TABS: ORAL_TABLET | ORAL | Status: DC

## 2013-12-03 NOTE — Telephone Encounter (Signed)
Medicine given to pt in hand

## 2013-12-27 ENCOUNTER — Telehealth: Payer: Self-pay

## 2013-12-27 NOTE — Telephone Encounter (Signed)
Opened in error

## 2014-01-18 ENCOUNTER — Telehealth: Payer: Self-pay | Admitting: Family Medicine

## 2014-01-18 NOTE — Telephone Encounter (Signed)
Pls contact pt and let him know that I recently had message for Dr Merlene Laughter re high/uncontrolled bloo pressure. He needs an oV for re eval of his BP. pls sched appt thanks

## 2014-02-06 ENCOUNTER — Telehealth: Payer: Self-pay | Admitting: Family Medicine

## 2014-02-06 MED ORDER — CLONIDINE HCL 0.3 MG PO TABS: ORAL_TABLET | ORAL | Status: DC

## 2014-02-06 NOTE — Telephone Encounter (Signed)
Med sent in.

## 2014-02-07 ENCOUNTER — Other Ambulatory Visit: Payer: Self-pay

## 2014-02-07 MED ORDER — CLONIDINE HCL 0.3 MG PO TABS: ORAL_TABLET | ORAL | Status: DC

## 2014-02-07 MED ORDER — AMLODIPINE-OLMESARTAN 10-40 MG PO TABS
1.0000 | ORAL_TABLET | Freq: Every morning | ORAL | Status: DC
Start: 2014-02-07 — End: 2014-03-07

## 2014-02-19 ENCOUNTER — Encounter (INDEPENDENT_AMBULATORY_CARE_PROVIDER_SITE_OTHER): Payer: Self-pay

## 2014-02-19 ENCOUNTER — Encounter: Payer: Self-pay | Admitting: Family Medicine

## 2014-02-19 ENCOUNTER — Ambulatory Visit (INDEPENDENT_AMBULATORY_CARE_PROVIDER_SITE_OTHER): Payer: Managed Care, Other (non HMO) | Admitting: Family Medicine

## 2014-02-19 ENCOUNTER — Other Ambulatory Visit: Payer: Self-pay | Admitting: Family Medicine

## 2014-02-19 VITALS — BP 168/100 | HR 88 | Resp 18 | Ht 72.5 in | Wt 220.1 lb

## 2014-02-19 DIAGNOSIS — R351 Nocturia: Secondary | ICD-10-CM | POA: Insufficient documentation

## 2014-02-19 DIAGNOSIS — R51 Headache: Secondary | ICD-10-CM

## 2014-02-19 DIAGNOSIS — I10 Essential (primary) hypertension: Secondary | ICD-10-CM

## 2014-02-19 DIAGNOSIS — N281 Cyst of kidney, acquired: Secondary | ICD-10-CM

## 2014-02-19 DIAGNOSIS — R5383 Other fatigue: Secondary | ICD-10-CM

## 2014-02-19 DIAGNOSIS — N183 Chronic kidney disease, stage 3 unspecified: Secondary | ICD-10-CM

## 2014-02-19 DIAGNOSIS — Q619 Cystic kidney disease, unspecified: Secondary | ICD-10-CM

## 2014-02-19 DIAGNOSIS — R7301 Impaired fasting glucose: Secondary | ICD-10-CM

## 2014-02-19 DIAGNOSIS — R5381 Other malaise: Secondary | ICD-10-CM

## 2014-02-19 MED ORDER — DOXAZOSIN MESYLATE 4 MG PO TABS: ORAL_TABLET | ORAL | Status: DC

## 2014-02-19 NOTE — Patient Instructions (Addendum)
F/u in  6 weeks, call if you need me before  Chem 7 and EGFR and HBA1C today  You are referred for [place] of kidneys  You are referred to urologist to evaluate your poor urine stream at night, and cysts on your kidneys  You are referred for US of the arteries  To your kidneys which will be in [place] additional med for your BP is cardura 4 mg at bedtime, continue the other 2 meds you are already taking  PLS keep f/u appt, bP needs to be right

## 2014-02-20 ENCOUNTER — Other Ambulatory Visit (HOSPITAL_COMMUNITY): Payer: Self-pay | Admitting: Family Medicine

## 2014-02-20 DIAGNOSIS — I10 Essential (primary) hypertension: Secondary | ICD-10-CM

## 2014-02-21 ENCOUNTER — Ambulatory Visit (INDEPENDENT_AMBULATORY_CARE_PROVIDER_SITE_OTHER): Payer: Managed Care, Other (non HMO) | Admitting: Urology

## 2014-02-21 DIAGNOSIS — R609 Edema, unspecified: Secondary | ICD-10-CM

## 2014-02-21 DIAGNOSIS — R351 Nocturia: Secondary | ICD-10-CM

## 2014-02-21 LAB — HEMOGLOBIN A1C
Hgb A1c MFr Bld: 5 % (ref <5.7)
MEAN PLASMA GLUCOSE: 97 mg/dL (ref <117)

## 2014-02-22 LAB — COMPLETE METABOLIC PANEL WITH GFR
ALT: 21 U/L (ref 0–53)
AST: 22 U/L (ref 0–37)
Albumin: 4.3 g/dL (ref 3.5–5.2)
Alkaline Phosphatase: 79 U/L (ref 39–117)
BILIRUBIN TOTAL: 0.6 mg/dL (ref 0.2–1.2)
BUN: 20 mg/dL (ref 6–23)
CALCIUM: 9.3 mg/dL (ref 8.4–10.5)
CHLORIDE: 102 meq/L (ref 96–112)
CO2: 30 meq/L (ref 19–32)
CREATININE: 1.9 mg/dL — AB (ref 0.50–1.35)
GFR, EST AFRICAN AMERICAN: 48 mL/min — AB
GFR, EST NON AFRICAN AMERICAN: 42 mL/min — AB
GLUCOSE: 82 mg/dL (ref 70–99)
Potassium: 4.2 mEq/L (ref 3.5–5.3)
Sodium: 140 mEq/L (ref 135–145)
Total Protein: 6.7 g/dL (ref 6.0–8.3)

## 2014-02-23 DIAGNOSIS — N281 Cyst of kidney, acquired: Secondary | ICD-10-CM | POA: Insufficient documentation

## 2014-02-23 DIAGNOSIS — N184 Chronic kidney disease, stage 4 (severe): Secondary | ICD-10-CM | POA: Insufficient documentation

## 2014-02-23 HISTORY — DX: Cyst of kidney, acquired: N28.1

## 2014-02-23 NOTE — Assessment & Plan Note (Signed)
Managed by neurology and reports reduced frequency and severity

## 2014-02-23 NOTE — Assessment & Plan Note (Signed)
Fatigue and 20 pound weight gain in 5 month, check TSH

## 2014-02-23 NOTE — Assessment & Plan Note (Addendum)
Nocturia with poor stream worsening in past approximately 6 month Normal PSA in 09/2013. Refer to urology for prostate eval and  Help with management ofobstructive symptoms Cardura has been started to help with symptoms and for BP management

## 2014-02-23 NOTE — Assessment & Plan Note (Signed)
Repeat US of kidneys and evaluation and management by urology

## 2014-02-23 NOTE — Assessment & Plan Note (Signed)
Worsening kidney disease, re screnn for diabetes, importance of control of HTN stressed, pt needs renal US as well as [place] of renal arteries to r/o RAS, this is explained and the need to follow through is stressed
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
## 2014-02-23 NOTE — Assessment & Plan Note (Signed)
Continues to be uncontrolled, pt already has CKD. Counselled again about the importance of medical compliance to establish normal BP Add cardura, due to BPH symptoms DASH diet and commitment to daily physical activity for a minimum of 30 minutes discussed and encouraged, as a part of hypertension management. The importance of attaining a healthy weight is also discussed.

## 2014-02-24 ENCOUNTER — Ambulatory Visit (HOSPITAL_COMMUNITY)
Admission: RE | Admit: 2014-02-24 | Discharge: 2014-02-24 | Disposition: A | Discharge status: Home / Self Care | Payer: Managed Care, Other (non HMO) | Source: Ambulatory Visit | Attending: Family Medicine | Admitting: Family Medicine

## 2014-02-24 DIAGNOSIS — Q618 Other cystic kidney diseases: Secondary | ICD-10-CM | POA: Insufficient documentation

## 2014-02-24 DIAGNOSIS — N281 Cyst of kidney, acquired: Secondary | ICD-10-CM

## 2014-02-24 LAB — TSH: TSH: 1.126 u[IU]/mL (ref 0.350–4.500)

## 2014-02-25 ENCOUNTER — Ambulatory Visit (HOSPITAL_COMMUNITY): Payer: Managed Care, Other (non HMO)

## 2014-02-28 ENCOUNTER — Other Ambulatory Visit: Payer: Self-pay

## 2014-02-28 DIAGNOSIS — I10 Essential (primary) hypertension: Secondary | ICD-10-CM

## 2014-02-28 MED ORDER — DOXAZOSIN MESYLATE 4 MG PO TABS: ORAL_TABLET | ORAL | Status: DC

## 2014-03-03 ENCOUNTER — Telehealth: Payer: Self-pay

## 2014-03-03 DIAGNOSIS — R51 Headache: Secondary | ICD-10-CM

## 2014-03-03 NOTE — Telephone Encounter (Signed)
Thank , will do

## 2014-03-07 ENCOUNTER — Other Ambulatory Visit: Payer: Self-pay

## 2014-03-07 MED ORDER — AMLODIPINE-OLMESARTAN 10-40 MG PO TABS: 1.0000 | ORAL_TABLET | Freq: Every morning | ORAL | Status: DC

## 2014-03-12 ENCOUNTER — Other Ambulatory Visit: Payer: Self-pay

## 2014-03-12 DIAGNOSIS — I10 Essential (primary) hypertension: Secondary | ICD-10-CM

## 2014-03-12 MED ORDER — DOXAZOSIN MESYLATE 4 MG PO TABS: ORAL_TABLET | ORAL | Status: DC

## 2014-03-14 ENCOUNTER — Ambulatory Visit: Payer: Self-pay | Admitting: Urology

## 2014-03-28 ENCOUNTER — Telehealth: Payer: Self-pay | Admitting: *Deleted

## 2014-03-28 DIAGNOSIS — I10 Essential (primary) hypertension: Secondary | ICD-10-CM

## 2014-03-28 MED ORDER — CLONIDINE HCL 0.3 MG PO TABS: ORAL_TABLET | ORAL | Status: DC

## 2014-03-28 MED ORDER — DOXAZOSIN MESYLATE 4 MG PO TABS: ORAL_TABLET | ORAL | Status: DC

## 2014-03-28 MED ORDER — AMLODIPINE-OLMESARTAN 10-40 MG PO TABS: 1.0000 | ORAL_TABLET | Freq: Every morning | ORAL | Status: DC

## 2014-03-28 NOTE — Telephone Encounter (Signed)
Pt's wife called stating he needs his bp pills refilled, per wife it needs to be a 90 day supply.

## 2014-03-28 NOTE — Telephone Encounter (Signed)
meds refilled 

## 2014-04-02 ENCOUNTER — Encounter (INDEPENDENT_AMBULATORY_CARE_PROVIDER_SITE_OTHER): Payer: Self-pay

## 2014-04-02 ENCOUNTER — Ambulatory Visit: Payer: Managed Care, Other (non HMO) | Admitting: Family Medicine

## 2014-04-02 ENCOUNTER — Ambulatory Visit (INDEPENDENT_AMBULATORY_CARE_PROVIDER_SITE_OTHER): Payer: Managed Care, Other (non HMO) | Admitting: Family Medicine

## 2014-04-02 ENCOUNTER — Encounter: Payer: Self-pay | Admitting: Family Medicine

## 2014-04-02 VITALS — BP 160/100 | HR 100 | Resp 20 | Ht 72.5 in | Wt 224.0 lb

## 2014-04-02 DIAGNOSIS — R51 Headache: Secondary | ICD-10-CM

## 2014-04-02 DIAGNOSIS — N183 Chronic kidney disease, stage 3 unspecified: Secondary | ICD-10-CM

## 2014-04-02 DIAGNOSIS — I10 Essential (primary) hypertension: Secondary | ICD-10-CM

## 2014-04-02 MED ORDER — CLONIDINE HCL 0.2 MG PO TABS: 0.2000 mg | ORAL_TABLET | Freq: Two times a day (BID) | ORAL | Status: DC

## 2014-04-02 NOTE — Patient Instructions (Addendum)
F/u in 5 weeks, call if you need me before  New higher dose of clonidine is 0.2mg  one tablet every 12 hours , need to get bP right to protect kidneys  No ibuprofen, alleve etc, no NSDAIDS to help protect kidneys.  pls schedule appt for evaluation of blood flow to kidneys which was ordered at last visit, this will help to determine if there is an underlying problem causing your high blood pressure which can be fixed, this is important  Reschedule appt with new neurologist you requested re headache management pls

## 2014-04-02 NOTE — Progress Notes (Signed)
   Subjective:    Patient ID: [name], male    DOB: 10-06-1970, 43 y.o.   MRN: [id number]  HPI The PT is here for follow up and re-evaluation of chronic medical conditions, medication management and review of any available recent lab and radiology data.  Preventive health is updated, specifically  Cancer screening and Immunization.   Still has not had arteria study for circulation to kidneys, and was unable to keep appt for the new neurologist which he requested. Non compliant with medications prescribed continues and his BP remains high       Review of Systems     Objective:   Physical Exam BP 160/100  Pulse 100  Resp 20  Ht 6' 0.5" (1.842 m)  Wt 224 lb (101.606 kg)  BMI 29.95 kg/m2  SpO2 95% Patient alert and oriented and in no cardiopulmonary distress.  HEENT: No facial asymmetry, EOMI,   oropharynx pink and moist.  Neck supple no JVD, no mass.  Chest: Clear to auscultation bilaterally.  CVS: S1, S2 no murmurs, no S3.Regular rate.  ABD: Soft non tender.   Ext: No edema  MCNS: CN 2-12 intact, power,  normal throughout.no focal deficits noted.        Assessment & Plan:  Uncontrolled stage 2 hypertension Uncontrolled with kidney disease, pt is aware , has outstanding study to re schedule. Adherence with medication is stressed againds DASH diet and commitment to daily physical activity for a minimum of 30 minutes discussed and encouraged, as a part of hypertension management. The importance of attaining a healthy weight is also discussed.   Headache(784.0) Has requested managemnt with new neurologist, missed initial appt there and he needs to reschedule  CKD (chronic kidney disease) stage 3, GFR 30-59 ml/min Tim e spent during this visit again reviewing the sgnificance of this with pt who appears surprised, the direct link to his blood pressure remaining uncontrolled is reviewed, needs renal involvement but will hold on this as still has not had previous  tests ordered

## 2014-04-06 NOTE — Progress Notes (Signed)
   Subjective:    Patient ID: [name], male    DOB: Aug 11, 1971, 43 y.o.   MRN: [id number]  HPI    Review of Systems See HPI Denies recent fever or chills. Denies sinus pressure, nasal congestion, ear pain or sore throat. Denies chest congestion, productive cough or wheezing. Chronic  headaches, unchanged , need neuro eval and management  Denies depression, anxiety or insomnia. Denies skin break down or rash.        Objective:   Physical Exam        Assessment & Plan:

## 2014-04-06 NOTE — Assessment & Plan Note (Signed)
Has requested managemnt with new neurologist, missed initial appt there and he needs to reschedule

## 2014-04-06 NOTE — Assessment & Plan Note (Signed)
Uncontrolled with kidney disease, pt is aware , has outstanding study to re schedule. Adherence with medication is stressed againds DASH diet and commitment to daily physical activity for a minimum of 30 minutes discussed and encouraged, as a part of hypertension management. The importance of attaining a healthy weight is also discussed.

## 2014-04-06 NOTE — Assessment & Plan Note (Signed)
Tim e spent during this visit again reviewing the sgnificance of this with pt who appears surprised, the direct link to his blood pressure remaining uncontrolled is reviewed, needs renal involvement but will hold on this as still has not had previous tests ordered

## 2014-04-07 ENCOUNTER — Ambulatory Visit (HOSPITAL_COMMUNITY)
Admission: RE | Admit: 2014-04-07 | Discharge: 2014-04-07 | Disposition: A | Discharge status: Home / Self Care | Payer: Managed Care, Other (non HMO) | Source: Ambulatory Visit | Attending: Family Medicine | Admitting: Family Medicine

## 2014-04-07 DIAGNOSIS — I10 Essential (primary) hypertension: Secondary | ICD-10-CM

## 2014-04-07 DIAGNOSIS — Q619 Cystic kidney disease, unspecified: Secondary | ICD-10-CM | POA: Insufficient documentation

## 2014-04-07 DIAGNOSIS — N281 Cyst of kidney, acquired: Secondary | ICD-10-CM

## 2014-04-07 NOTE — Progress Notes (Signed)
*  PRELIMINARY RESULTS* Vascular Ultrasound Renal Artery Duplex has been completed.  Preliminary findings:   No evidence of renal artery stenosis bilaterally. Bilateral renal cysts noted.   Landry Mellow, RDMS, RVT  04/07/2014, 10:47 AM

## 2014-04-11 ENCOUNTER — Other Ambulatory Visit: Payer: Self-pay | Admitting: Family Medicine

## 2014-04-29 ENCOUNTER — Other Ambulatory Visit: Payer: Self-pay

## 2014-04-29 DIAGNOSIS — I10 Essential (primary) hypertension: Secondary | ICD-10-CM

## 2014-04-29 MED ORDER — CLONIDINE HCL 0.2 MG PO TABS: 0.2000 mg | ORAL_TABLET | Freq: Two times a day (BID) | ORAL | Status: DC

## 2014-04-30 ENCOUNTER — Telehealth: Payer: Self-pay

## 2014-04-30 NOTE — Telephone Encounter (Signed)
States he doesn't feel like the new dose of clonidine is working as well as the old dose. Still feels like BP is elevated and keeps a slight headache. Wants to go back to the old med.

## 2014-04-30 NOTE — Telephone Encounter (Signed)
Verify that he is taking the meds as prescribed , needs to bring them in when he comes for visit for blood pressure re eval, will do nurse visit this time, I will check behind nurse BP , he needs to set up MD visit for October, he should have scheduled a visit for f/u , none in system. Pls make he arrangements with him, he al,so absolutely needs to bring the bottles when he comes

## 2014-04-30 NOTE — Progress Notes (Signed)
   Subjective:    Patient ID: [name], male    DOB: July 17, 1971, 43 y.o.   MRN: [id number]  HPI Pt in for management of persistently elevated and uncontrolled blood pressure. He states he is "out of one of his medications" and plans to collect this soon Denies chest pain, palpitation, pND , orhopnea or leg swelling. Wants to see new neurologist, re headache management and a referral is being set up Still needs to have study as  far as renal blood flow is concerned, and I also spent time explaining how important it is that he get his blood pressure controlled to prevent further kidney damage   Review of Systems See HPI Denies recent fever or chills. Denies sinus pressure, nasal congestion, ear pain or sore throat. Denies chest congestion, productive cough or wheezing. Denies chest pains, palpitations and leg swelling Denies abdominal pain, nausea, vomiting,diarrhea or constipation.   Denies dysuria, frequency, hesitancy or incontinence. . Denies depression, anxiety or insomnia. Denies skin break down or rash.        Objective:   Physical Exam BP 160/100  Pulse 100  Resp 20  Ht 6' 0.5" (1.842 m)  Wt 224 lb (101.606 kg)  BMI 29.95 kg/m2  SpO2 95% Patient alert and oriented and in no cardiopulmonary distress.  HEENT: No facial asymmetry, EOMI,   oropharynx pink and moist.  Neck supple no JVD, no mass.  Chest: Clear to auscultation bilaterally.  CVS: S1, S2 no murmurs, no S3.Regular rate.  ABD: Soft non tender.   Ext: No edema  MS: Adequate ROM spine, shoulders, hips and knees.  Skin: Intact, no ulcerations or rash noted.  Psych: Good eye contact, normal affect. Memory intact not anxious or depressed appearing.  CNS: CN 2-12 intact, power,  normal throughout.no focal deficits noted.        Assessment & Plan:

## 2014-05-01 NOTE — Telephone Encounter (Signed)
Will come in the am (friday) around 9am for BP check and bring his bottles.

## 2014-05-02 ENCOUNTER — Encounter (INDEPENDENT_AMBULATORY_CARE_PROVIDER_SITE_OTHER): Payer: Self-pay

## 2014-05-02 ENCOUNTER — Ambulatory Visit: Payer: Managed Care, Other (non HMO)

## 2014-05-02 VITALS — BP 136/84 | Wt 231.0 lb

## 2014-05-02 DIAGNOSIS — I10 Essential (primary) hypertension: Secondary | ICD-10-CM

## 2014-05-02 NOTE — Progress Notes (Signed)
Patient in for blood pressure check and medication management.   Patient aware that he should continue medicine as prescribed per md.

## 2014-05-23 ENCOUNTER — Telehealth: Payer: Self-pay

## 2014-05-23 ENCOUNTER — Other Ambulatory Visit: Payer: Self-pay

## 2014-05-23 DIAGNOSIS — I10 Essential (primary) hypertension: Secondary | ICD-10-CM

## 2014-05-23 MED ORDER — FUROSEMIDE 20 MG PO TABS: ORAL_TABLET | ORAL | Status: DC

## 2014-05-23 MED ORDER — DOXAZOSIN MESYLATE 4 MG PO TABS
ORAL_TABLET | ORAL | Status: DC
Start: 2014-05-23 — End: 2014-08-19

## 2014-05-23 MED ORDER — DOXAZOSIN MESYLATE 4 MG PO TABS: ORAL_TABLET | ORAL | Status: DC

## 2014-05-23 NOTE — Telephone Encounter (Signed)
Patient aware and states understanding of only taking as needed

## 2014-05-23 NOTE — Telephone Encounter (Signed)
Pls let him know that lasix has been prescribed ONLY for AS NEEDED use ,  Encourage leg elevation and reduced sodium intake, risk of dehydration with excessive use of diuretic so DO NOT take unless he needs it I have sent to the pharmacy

## 2014-05-28 ENCOUNTER — Encounter: Payer: Self-pay | Admitting: Internal Medicine

## 2014-05-28 ENCOUNTER — Other Ambulatory Visit: Payer: Self-pay | Admitting: Family Medicine

## 2014-06-04 ENCOUNTER — Telehealth: Payer: Self-pay | Admitting: Family Medicine

## 2014-06-04 NOTE — Telephone Encounter (Signed)
Patient aware.

## 2014-06-09 ENCOUNTER — Other Ambulatory Visit: Payer: Self-pay

## 2014-06-09 MED ORDER — AMLODIPINE-OLMESARTAN 10-40 MG PO TABS: 1.0000 | ORAL_TABLET | Freq: Every morning | ORAL | Status: DC

## 2014-06-14 ENCOUNTER — Emergency Department (HOSPITAL_COMMUNITY)
Admission: EM | Admit: 2014-06-14 | Discharge: 2014-06-14 | Disposition: A | Discharge status: Home / Self Care | Payer: Managed Care, Other (non HMO) | Attending: Emergency Medicine | Admitting: Emergency Medicine

## 2014-06-14 ENCOUNTER — Encounter (HOSPITAL_COMMUNITY): Payer: Self-pay | Admitting: Emergency Medicine

## 2014-06-14 ENCOUNTER — Emergency Department (HOSPITAL_COMMUNITY): Payer: Managed Care, Other (non HMO)

## 2014-06-14 DIAGNOSIS — G8929 Other chronic pain: Secondary | ICD-10-CM | POA: Diagnosis not present

## 2014-06-14 DIAGNOSIS — R0609 Other forms of dyspnea: Secondary | ICD-10-CM | POA: Insufficient documentation

## 2014-06-14 DIAGNOSIS — Z79899 Other long term (current) drug therapy: Secondary | ICD-10-CM | POA: Insufficient documentation

## 2014-06-14 DIAGNOSIS — E785 Hyperlipidemia, unspecified: Secondary | ICD-10-CM | POA: Insufficient documentation

## 2014-06-14 DIAGNOSIS — I10 Essential (primary) hypertension: Secondary | ICD-10-CM | POA: Diagnosis not present

## 2014-06-14 DIAGNOSIS — Z87891 Personal history of nicotine dependence: Secondary | ICD-10-CM | POA: Diagnosis not present

## 2014-06-14 DIAGNOSIS — E669 Obesity, unspecified: Secondary | ICD-10-CM | POA: Diagnosis not present

## 2014-06-14 DIAGNOSIS — N189 Chronic kidney disease, unspecified: Secondary | ICD-10-CM | POA: Insufficient documentation

## 2014-06-14 DIAGNOSIS — K219 Gastro-esophageal reflux disease without esophagitis: Secondary | ICD-10-CM | POA: Diagnosis not present

## 2014-06-14 DIAGNOSIS — M79609 Pain in unspecified limb: Secondary | ICD-10-CM | POA: Insufficient documentation

## 2014-06-14 DIAGNOSIS — R0600 Dyspnea, unspecified: Secondary | ICD-10-CM

## 2014-06-14 DIAGNOSIS — I129 Hypertensive chronic kidney disease with stage 1 through stage 4 chronic kidney disease, or unspecified chronic kidney disease: Secondary | ICD-10-CM | POA: Insufficient documentation

## 2014-06-14 DIAGNOSIS — Z8711 Personal history of peptic ulcer disease: Secondary | ICD-10-CM | POA: Diagnosis not present

## 2014-06-14 DIAGNOSIS — R0989 Other specified symptoms and signs involving the circulatory and respiratory systems: Secondary | ICD-10-CM | POA: Insufficient documentation

## 2014-06-14 DIAGNOSIS — M79602 Pain in left arm: Secondary | ICD-10-CM

## 2014-06-14 LAB — CBC WITH DIFFERENTIAL/PLATELET
BASOS PCT: 0 % (ref 0–1)
Basophils Absolute: 0 10*3/uL (ref 0.0–0.1)
Eosinophils Absolute: 0.1 10*3/uL (ref 0.0–0.7)
Eosinophils Relative: 2 % (ref 0–5)
HEMATOCRIT: 35.7 % — AB (ref 39.0–52.0)
Hemoglobin: 12.6 g/dL — ABNORMAL LOW (ref 13.0–17.0)
Lymphocytes Relative: 30 % (ref 12–46)
Lymphs Abs: 1.4 10*3/uL (ref 0.7–4.0)
MCH: 33.2 pg (ref 26.0–34.0)
MCHC: 35.3 g/dL (ref 30.0–36.0)
MCV: 93.9 fL (ref 78.0–100.0)
Monocytes Absolute: 0.4 10*3/uL (ref 0.1–1.0)
Monocytes Relative: 8 % (ref 3–12)
NEUTROS PCT: 60 % (ref 43–77)
Neutro Abs: 2.8 10*3/uL (ref 1.7–7.7)
Platelets: 264 10*3/uL (ref 150–400)
RBC: 3.8 MIL/uL — AB (ref 4.22–5.81)
RDW: 11.4 % — ABNORMAL LOW (ref 11.5–15.5)
WBC: 4.7 10*3/uL (ref 4.0–10.5)

## 2014-06-14 LAB — BASIC METABOLIC PANEL
Anion gap: 12 (ref 5–15)
BUN: 22 mg/dL (ref 6–23)
CHLORIDE: 102 meq/L (ref 96–112)
CO2: 28 meq/L (ref 19–32)
Calcium: 8.8 mg/dL (ref 8.4–10.5)
Creatinine, Ser: 2.09 mg/dL — ABNORMAL HIGH (ref 0.50–1.35)
GFR calc non Af Amer: 37 mL/min — ABNORMAL LOW (ref >90)
GFR, EST AFRICAN AMERICAN: 42 mL/min — AB (ref >90)
GLUCOSE: 103 mg/dL — AB (ref 70–99)
POTASSIUM: 3.6 meq/L — AB (ref 3.7–5.3)
Sodium: 142 mEq/L (ref 137–147)

## 2014-06-14 LAB — TROPONIN I

## 2014-06-14 MED ORDER — MORPHINE SULFATE 4 MG/ML IJ SOLN
6.0000 mg | Freq: Once | INTRAMUSCULAR | Status: AC
  Administered 2014-06-14: 6 mg via INTRAVENOUS
  Filled 2014-06-14: qty 2

## 2014-06-14 MED ORDER — HYDROMORPHONE HCL 1 MG/ML IJ SOLN
1.0000 mg | Freq: Once | INTRAMUSCULAR | Status: AC
  Administered 2014-06-14: 1 mg via INTRAVENOUS
  Filled 2014-06-14: qty 1

## 2014-06-14 MED ORDER — ASPIRIN 81 MG PO CHEW
324.0000 mg | CHEWABLE_TABLET | Freq: Once | ORAL | Status: AC
  Administered 2014-06-14: 324 mg via ORAL
  Filled 2014-06-14: qty 4

## 2014-06-14 NOTE — ED Notes (Signed)
Patient c/o left arm pain that radiates to shoulder since midnight and shortness of breath x 30 minutes.

## 2014-06-14 NOTE — ED Provider Notes (Signed)
CSN: [id number]     Arrival date & time 06/14/14  0353 History   First MD Initiated Contact with Patient 06/14/14 0434     Chief Complaint  Patient presents with  . Arm Pain     (Consider location/radiation/quality/duration/timing/severity/associated sxs/prior Treatment) HPI Comments: 43 year old male with history of high blood pressure, chronic kidney disease, peptic ulcer disease presents with left upper arm pain and mild shortness of breath. Around midnight patient developed left upper arm pain shoulder pain and soon after had mild shortness of breath lasting 30 minutes. Patient denies history of similar. No injuries or swelling to the arm. No chest pain or bowel pain. Nothing specifically worsens or improves. No cardiac history or blood clot history. No recent surgeries, active cancer, unilateral leg symptoms or hemoptysis.  Patient is a 43 y.o. male presenting with arm pain. The history is provided by the patient.  Arm Pain Associated symptoms include shortness of breath. Pertinent negatives include no chest pain, no abdominal pain and no headaches.    Past Medical History  Diagnosis Date  . Chronic back pain   . Abdominal tenderness   . Unspecified essential hypertension   . Headache(784.0)   . Headache(784.0)   . Essential hypertension, benign   . Duodenitis with bleeding 08/2008    with ulcer / admitted to [facility]   . GERD (gastroesophageal reflux disease)   . Weight gain     mild  . Hyperlipidemia     on medication  . Obesity, unspecified    Past Surgical History  Procedure Laterality Date  . Foot fracture surgery    . Finger debridement Left 09/25/2013    LEFT THUMB  . Amputation Left 09/26/2013    Procedure: 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.;  Surgeon: Jolyn Nap, MD;  Location: [place];  Service: Orthopedics;  Laterality: Left;   Family History  Problem Relation Age of Onset  . Diabetes Mother   . Hypertension  Mother   . Hypertension Father   . Arthritis Father   . Diabetes Father    History  Substance Use Topics  . Smoking status: Former Smoker -- 0.10 packs/day    Types: Cigarettes    Quit date: 05/28/2012  . Smokeless tobacco: Never Used  . Alcohol Use: No    Review of Systems  Constitutional: Negative for fever and chills.  HENT: Negative for congestion.   Eyes: Negative for visual disturbance.  Respiratory: Positive for shortness of breath.   Cardiovascular: Negative for chest pain.  Gastrointestinal: Negative for vomiting and abdominal pain.  Genitourinary: Negative for dysuria and flank pain.  Musculoskeletal: Negative for back pain, neck pain and neck stiffness.  Skin: Negative for rash.  Neurological: Negative for light-headedness and headaches.      Allergies  Bee venom; Prochlorperazine edisylate; Cyclobenzaprine hcl; Aleve; Carbamazepine; Ibuprofen; Maxzide; Tylenol; and Other  Home Medications   Prior to Admission medications   Medication Sig Start Date End Date Taking? Authorizing Provider  butalbital-acetaminophen-caffeine (FIORICET, ESGIC) 50-325-40 MG per tablet Take 1 tablet by mouth every 6 (six) hours. 03/18/14  Yes Historical Provider, MD  cloNIDine (CATAPRES) 0.2 MG tablet Take 1 tablet (0.2 mg total) by mouth 2 (two) times daily. 04/29/14  Yes Fayrene Helper, MD  doxazosin (CARDURA) 4 MG tablet One tablet at bedtime for high blood pressure 05/23/14  Yes Fayrene Helper, MD  furosemide (LASIX) 20 MG tablet One tablet once daily, as needed, for leg swelling  05/23/14  Yes Fayrene Helper, MD  oxyCODONE-acetaminophen (PERCOCET) 7.5-325 MG per tablet Take 1 tablet by mouth 2 (two) times daily. 03/18/14  Yes Tarry Kos, NP  amLODipine-olmesartan (AZOR) 10-40 MG per tablet Take 1 tablet by mouth every morning. 06/09/14   Fayrene Helper, MD  hydrOXYzine (ATARAX/VISTARIL) 25 MG tablet Take 25 mg by mouth 2 (two) times daily. 03/18/14   Historical  Provider, MD   BP 138/101  Pulse 60  Temp(Src) 98.3 F (36.8 C) (Oral)  Resp 18  Ht 6' (1.829 m)  Wt 230 lb (104.327 kg)  BMI 31.19 kg/m2  SpO2 98% Physical Exam  Nursing note and vitals reviewed. Constitutional: He is oriented to person, place, and time. He appears well-developed and well-nourished.  HENT:  Head: Normocephalic and atraumatic.  Eyes: Conjunctivae are normal. Right eye exhibits no discharge. Left eye exhibits no discharge.  Neck: Normal range of motion. Neck supple. No tracheal deviation present.  Cardiovascular: Normal rate and regular rhythm.   No murmur heard. Pulmonary/Chest: Effort normal and breath sounds normal.  Abdominal: Soft. He exhibits no distension. There is no tenderness. There is no guarding.  Musculoskeletal: He exhibits no edema.  Patient has tenderness left upper arm, normal strength 5+ with shoulder abduction, arm flexion, wrist extension bilateral upper extremities. 2+ pulses bilateral upper extremities. Sensation intact bilateral upper extremities.  Neurological: He is alert and oriented to person, place, and time.  Skin: Skin is warm. No rash noted.  Psychiatric: He has a normal mood and affect.    ED Course  Procedures (including critical care time) Labs Review Labs Reviewed  CBC WITH DIFFERENTIAL - Abnormal; Notable for the following:    RBC 3.80 (*)    Hemoglobin 12.6 (*)    HCT 35.7 (*)    RDW 11.4 (*)    All other components within normal limits  BASIC METABOLIC PANEL  TROPONIN I  TROPONIN I    Imaging Review Dg Chest 2 View  06/14/2014   CLINICAL DATA:  Right arm pain and shortness of breath.  EXAM: CHEST  2 VIEW  COMPARISON:  10/21/2010  FINDINGS: Densities in the right paratracheal region may be related to an azygos lobe and unchanged. The lungs are clear bilaterally. Heart and mediastinum are within normal limits. No evidence for pleural effusions. No acute bone abnormality.  IMPRESSION: No active cardiopulmonary disease.    Electronically Signed   By: Markus Daft M.D.   On: 06/14/2014 07:38     EKG Interpretation   Date/Time:  Saturday June 14 2014 04:13:55 EDT Ventricular Rate:  59 PR Interval:  156 QRS Duration: 109 QT Interval:  398 QTC Calculation: 394 R Axis:   4 Text Interpretation:  Sinus rhythm ST elev, probable normal early repol  pattern, no recipricol depression Confirmed by Marvel Mcphillips  MD, Jillienne Egner ([id number])  on 06/14/2014 6:04:56 AM     Repeat EKG heart rate 59, very mild elevation V2 without reciprocal depression, similar previous. Normal QT, sinus rhythm MDM   Final diagnoses:  Dyspnea  Arm pain, left  HTN  Patient with left upper arm pain and mild shortness of breath that resolve. History of uncontrolled blood pressure, elevated in ER today. Patient has no chest pain or exertional symptoms. Plan for cardiac screen for atypical presentation. Aspirin and morphine given for pain. EKG reviewed no acute findings Pt low risk cardiac, outpt fup discussed for possible stress test.  Patient denies cholesterol, smoking or known cardiac history. Patient has no blood clot  risk factors, oxygenation normal, respiratory rate and heart rate normal. No concern for blood clot at this time.  Pt has fup outpt for bp.  , On recheck patient has no symptoms and feels well. Patient comfortable with outpatient followup and will discuss possible stress test with primary care Dr.  Results and differential diagnosis were discussed with the patient/parent/guardian. Close follow up outpatient was discussed, comfortable with the plan.   Medications  aspirin chewable tablet 324 mg (324 mg Oral Given 06/14/14 0507)  morphine 4 MG/ML injection 6 mg (6 mg Intravenous Given 06/14/14 0508)  HYDROmorphone (DILAUDID) injection 1 mg (1 mg Intravenous Given 06/14/14 0643)    Filed Vitals:   06/14/14 0530 06/14/14 0545 06/14/14 0600 06/14/14 0619  BP: 133/97  140/93   Pulse: 53 55 56   Temp:      TempSrc:      Resp: 11 10 12     Height:      Weight:    230 lb (104.327 kg)  SpO2: 96% 97% 97%     Final diagnoses:  Dyspnea  Arm pain, left       Mariea Clonts, MD 06/14/14 [phone]

## 2014-06-14 NOTE — Discharge Instructions (Signed)
Discussed outpatient stress test with your Dr. If you were given medicines take as directed.  If you are on coumadin or contraceptives realize their levels and effectiveness is altered by many different medicines.  If you have any reaction (rash, tongues swelling, other) to the medicines stop taking and see a physician.   Please follow up as directed and return to the ER or see a physician for new or worsening symptoms.  Thank you. Filed Vitals:   06/14/14 0530 06/14/14 0545 06/14/14 0600 06/14/14 0619  BP: 133/97  140/93   Pulse: 53 55 56   Temp:      TempSrc:      Resp: 11 10 12    Height:      Weight:    230 lb (104.327 kg)  SpO2: 96% 97% 97%

## 2014-06-16 ENCOUNTER — Telehealth: Payer: Self-pay | Admitting: Family Medicine

## 2014-06-16 NOTE — Telephone Encounter (Signed)
pls arrange ED f/u this week with meds,per ed report needs to be seen, BP  Was elevated, and cardiology eval also recommended, thanks, IMPT

## 2014-06-16 NOTE — Telephone Encounter (Signed)
appt scheduled this week

## 2014-06-18 ENCOUNTER — Encounter: Payer: Self-pay | Admitting: Family Medicine

## 2014-06-18 ENCOUNTER — Ambulatory Visit (INDEPENDENT_AMBULATORY_CARE_PROVIDER_SITE_OTHER): Payer: Managed Care, Other (non HMO) | Admitting: Family Medicine

## 2014-06-18 VITALS — BP 120/82 | HR 99 | Resp 16 | Ht 72.0 in | Wt 245.0 lb

## 2014-06-18 DIAGNOSIS — I150 Renovascular hypertension: Secondary | ICD-10-CM

## 2014-06-18 DIAGNOSIS — Z125 Encounter for screening for malignant neoplasm of prostate: Secondary | ICD-10-CM

## 2014-06-18 DIAGNOSIS — IMO0002 Reserved for concepts with insufficient information to code with codable children: Secondary | ICD-10-CM

## 2014-06-18 DIAGNOSIS — S61209S Unspecified open wound of unspecified finger without damage to nail, sequela: Secondary | ICD-10-CM

## 2014-06-18 DIAGNOSIS — R9431 Abnormal electrocardiogram [ECG] [EKG]: Secondary | ICD-10-CM

## 2014-06-18 DIAGNOSIS — Z13220 Encounter for screening for lipoid disorders: Secondary | ICD-10-CM

## 2014-06-18 DIAGNOSIS — R0789 Other chest pain: Secondary | ICD-10-CM

## 2014-06-18 DIAGNOSIS — R072 Precordial pain: Secondary | ICD-10-CM | POA: Insufficient documentation

## 2014-06-18 DIAGNOSIS — N183 Chronic kidney disease, stage 3 unspecified: Secondary | ICD-10-CM

## 2014-06-18 DIAGNOSIS — E669 Obesity, unspecified: Secondary | ICD-10-CM

## 2014-06-18 NOTE — Patient Instructions (Addendum)
F/U in 4 .5 month, call if you need me before  You need to commit to daily exercise for 30 minutes and change in diet so that you lose weight, you are now 50 pounds overweight , which is increasing the work of your heart and increasing your risk of chronic disease  CONGRATS on excellent blood pressure   You are referred for evaluation by cardiologist  Please work on temper control, and dealing wih stressful situations in a way that you keep the risk to yourself LOWEST  Fasting lipid, cmp, and EGFR, hBA1C, pSA and cBC in 4.5 month
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
## 2014-06-21 DIAGNOSIS — E669 Obesity, unspecified: Secondary | ICD-10-CM | POA: Insufficient documentation

## 2014-06-21 NOTE — Assessment & Plan Note (Signed)
Deteriorated. Patient re-educated about  the importance of commitment to a  minimum of 150 minutes of exercise per week. The importance of healthy food choices with portion control discussed. Encouraged to start a food diary, count calories and to consider  joining a support group. Sample diet sheets offered. Goals set by the patient for the next several months.    

## 2014-06-21 NOTE — Assessment & Plan Note (Signed)
improtance of maintaining good bP and avoiding nephrotoxic agents is discussed, will refer to nephrology in the the future

## 2014-06-21 NOTE — Assessment & Plan Note (Signed)
Currently controlled DASH diet and commitment to daily physical activity for a minimum of 30 minutes discussed and encouraged, as a part of hypertension management. The importance of attaining a healthy weight is also discussed.

## 2014-06-21 NOTE — Assessment & Plan Note (Signed)
Recently seen in ED with chest pain , negative for ACS, however abnormal EKG, no known f/h CAD, referred to cardiology for further eval. Of note per pt recall, symptoms were aggravated by an incident in which he became extremely angry He has gained 50 pounds since stopping work this year following trauma to finger, and is not currently commited to regular exercise

## 2014-06-23 ENCOUNTER — Encounter: Payer: Self-pay | Admitting: Cardiology

## 2014-06-23 ENCOUNTER — Ambulatory Visit (INDEPENDENT_AMBULATORY_CARE_PROVIDER_SITE_OTHER): Payer: Managed Care, Other (non HMO) | Admitting: Cardiology

## 2014-06-23 VITALS — BP 138/90 | HR 70 | Ht 72.0 in | Wt 255.0 lb

## 2014-06-23 DIAGNOSIS — N183 Chronic kidney disease, stage 3 unspecified: Secondary | ICD-10-CM

## 2014-06-23 DIAGNOSIS — I10 Essential (primary) hypertension: Secondary | ICD-10-CM

## 2014-06-23 DIAGNOSIS — R072 Precordial pain: Secondary | ICD-10-CM

## 2014-06-23 DIAGNOSIS — R0602 Shortness of breath: Secondary | ICD-10-CM

## 2014-06-23 NOTE — Progress Notes (Signed)
Clinical Summary [name] is a 43 y.o.male referred for cardiology consultation by Dr. Moshe Cipro. Records indicate that he was seen in the ER on September 26 with left arm pain and shortness of breath. He tells me that he went to the ER having awakened at nighttime with left arm squeezing, subsequently tightness in the left side of his chest. Symptoms were more intense than usual, he has had left arm discomfort like this in the past several weeks. Also somewhat more short of breath with activity such as unloading equipment from his car. He has a band plays jazz at clubs, also plays music in church.  Recent labwork showed normal troponin I less than 0.30, potassium 3.6, BUN 22, creatinine 2.0, hemoglobin 12.6, platelets 264. Recent tracings show sinus rhythm with early repolarization, artifact and nonspecific T-wave changes. No significant change compared to 2013. Chest x-ray reports no active cardiopulmonary disease.  He has a history of hypertension over the years, reassuring lipid status with LDL of 52 back in January. Family history of cardiovascular disease noted as well. He has not undergone any prior cardiac ischemic testing.   Allergies  Allergen Reactions  . Bee Venom Anaphylaxis  . Prochlorperazine Edisylate Other (See Comments)    Reaction: Jittery,uncomfortable feeling  . Cyclobenzaprine Hcl Other (See Comments)    REACTION: feel anxious, nausea  . Aleve [Naproxen Sodium] Nausea And Vomiting    States that this medication also causes abdominal pain  . Carbamazepine     REACTION: unknown reaction  . Ibuprofen Nausea And Vomiting    States that this medication also causes abdominal pain  . Maxzide [Hydrochlorothiazide W-Triamterene]     headache  . Nsaids Other (See Comments)    H/o severe anemia from UGIB from ulcer  . Tylenol [Acetaminophen] Nausea And Vomiting    Patient states that this medication also causes abdominal pain  . Other Nausea And Vomiting, Rash and Other  (See Comments)    ALL MUSCLE RELAXANTS: states that they affect sciatic nerve, may cause nausea and vomiting, rash    Current Outpatient Prescriptions  Medication Sig Dispense Refill  . amLODipine-olmesartan (AZOR) 10-40 MG per tablet Take 1 tablet by mouth every morning.  90 tablet  0  . butalbital-acetaminophen-caffeine (FIORICET, ESGIC) 50-325-40 MG per tablet Take 1 tablet by mouth every 6 (six) hours.      . cloNIDine (CATAPRES) 0.2 MG tablet Take 1 tablet (0.2 mg total) by mouth 2 (two) times daily.  60 tablet  2  . doxazosin (CARDURA) 4 MG tablet One tablet at bedtime for high blood pressure  90 tablet  0  . furosemide (LASIX) 20 MG tablet One tablet once daily, as needed, for leg swelling  30 tablet  1  . hydrOXYzine (ATARAX/VISTARIL) 25 MG tablet Take 25 mg by mouth 2 (two) times daily.      Marland Kitchen oxyCODONE-acetaminophen (PERCOCET) 7.5-325 MG per tablet Take 1 tablet by mouth 2 (two) times daily.       No current facility-administered medications for this visit.    Past Medical History  Diagnosis Date  . Chronic back pain   . Headache(784.0)   . Essential hypertension, benign   . Duodenitis with bleeding 08/2008    Ulcer - admitted to [facility]   . GERD (gastroesophageal reflux disease)   . Hyperlipidemia   . Obesity, unspecified     Past Surgical History  Procedure Laterality Date  . Foot fracture surgery    . Finger debridement Left 09/25/2013  Thumb  . Amputation Left 09/26/2013    Procedure: 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.;  Surgeon: Jolyn Nap, MD;  Location: [place];  Service: Orthopedics;  Laterality: Left;    Family History  Problem Relation Age of Onset  . Diabetes Mother   . Hypertension Mother   . Hypertension Father   . Arthritis Father   . Diabetes Father     Social History [name] reports that he quit smoking about 2 years ago. His smoking use included Cigarettes. He smoked 0.10 packs per day.  He has never used smokeless tobacco. [name] reports that he does not drink alcohol.  Review of Systems No palpitations, dizziness, syncope. No orthopnea or PND. Stable appetite. States he injured his left thumb at work earlier in the year, has limited range of motion. Otherwise systems reviewed and negative.  Physical Examination Filed Vitals:   06/23/14 0932  BP: 138/90  Pulse: 70   Filed Weights   06/23/14 0932  Weight: 255 lb (115.667 kg)   Overweight male, appears comfortable at rest. HEENT: Conjunctiva and lids normal, oropharynx clear. Neck: Supple, no elevated JVP or carotid bruits, no thyromegaly. Lungs: Clear to auscultation, nonlabored breathing at rest. Cardiac: Regular rate and rhythm, S4 without significant systolic murmur, no pericardial rub. Abdomen: Soft, nontender, bowel sounds present, no guarding or rebound. Extremities: No pitting edema, distal pulses 2+. Skin: Warm and dry. Musculoskeletal: No kyphosis. Neuropsychiatric: Alert and oriented x3, affect grossly appropriate.   Problem List and Plan   Precordial pain Recent intermittent left arm tightness and associated left precordial pain as well as reported increased sense of dyspnea on exertion. Recent evaluation ER noted, troponin I negative, ECG showing repolarization abnormalities. Baseline cardiac risk factors include age and gender, history of hypertension, cardiovascular disease in the family. Plan is to proceed with an exercise Cardiolite for objective ischemic evaluation. We will plan a call him with the results.  Essential hypertension No change to current antihypertensive regimen, keep followup Dr. Moshe Cipro.  CKD (chronic kidney disease) stage 3, GFR 30-59 ml/min Recent creatinine 2.0.    Satira Sark, M.D., F.A.C.C.

## 2014-06-23 NOTE — Patient Instructions (Signed)
Your physician recommends that you schedule a follow-up appointment in: to be determined after stress test     Your physician recommends that you continue on your current medications as directed. Please refer to the Current Medication list given to you today.    Your physician has requested that you have en exercise stress myoview. For further information please visit [email or web address]. Please follow instruction sheet, as given.      Thank you for choosing [facility] !

## 2014-06-23 NOTE — Assessment & Plan Note (Signed)
Recent intermittent left arm tightness and associated left precordial pain as well as reported increased sense of dyspnea on exertion. Recent evaluation ER noted, troponin I negative, ECG showing repolarization abnormalities. Baseline cardiac risk factors include age and gender, history of hypertension, cardiovascular disease in the family. Plan is to proceed with an exercise Cardiolite for objective ischemic evaluation. We will plan a call him with the results.

## 2014-06-23 NOTE — Assessment & Plan Note (Signed)
No change to current antihypertensive regimen, keep followup Dr. Moshe Cipro.

## 2014-06-23 NOTE — Assessment & Plan Note (Signed)
Recent creatinine 2.0.

## 2014-06-30 ENCOUNTER — Encounter (HOSPITAL_COMMUNITY): Payer: Managed Care, Other (non HMO)

## 2014-06-30 ENCOUNTER — Inpatient Hospital Stay (HOSPITAL_COMMUNITY): Admission: RE | Admit: 2014-06-30 | Payer: Self-pay | Source: Ambulatory Visit

## 2014-07-09 ENCOUNTER — Telehealth: Payer: Self-pay

## 2014-07-09 NOTE — Telephone Encounter (Signed)
Patient states that he has headache and blood pressure is elevated.  Will contact neurology

## 2014-07-11 ENCOUNTER — Encounter (HOSPITAL_COMMUNITY)
Admission: RE | Admit: 2014-07-11 | Discharge: 2014-07-11 | Disposition: A | Discharge status: Home / Self Care | Payer: Managed Care, Other (non HMO) | Source: Ambulatory Visit | Attending: Cardiology | Admitting: Cardiology

## 2014-07-11 ENCOUNTER — Encounter (HOSPITAL_COMMUNITY): Payer: Self-pay

## 2014-07-11 ENCOUNTER — Ambulatory Visit (HOSPITAL_COMMUNITY)
Admission: RE | Admit: 2014-07-11 | Discharge: 2014-07-11 | Disposition: A | Discharge status: Home / Self Care | Payer: Managed Care, Other (non HMO) | Source: Ambulatory Visit | Attending: Cardiology | Admitting: Cardiology

## 2014-07-11 DIAGNOSIS — R079 Chest pain, unspecified: Secondary | ICD-10-CM | POA: Insufficient documentation

## 2014-07-11 DIAGNOSIS — R0600 Dyspnea, unspecified: Secondary | ICD-10-CM | POA: Diagnosis not present

## 2014-07-11 DIAGNOSIS — I2510 Atherosclerotic heart disease of native coronary artery without angina pectoris: Secondary | ICD-10-CM | POA: Insufficient documentation

## 2014-07-11 DIAGNOSIS — I5189 Other ill-defined heart diseases: Secondary | ICD-10-CM | POA: Insufficient documentation

## 2014-07-11 DIAGNOSIS — R072 Precordial pain: Secondary | ICD-10-CM

## 2014-07-11 DIAGNOSIS — R0602 Shortness of breath: Secondary | ICD-10-CM

## 2014-07-11 MED ORDER — TECHNETIUM TC 99M SESTAMIBI - CARDIOLITE
30.0000 | Freq: Once | INTRAVENOUS | Status: AC | PRN
  Administered 2014-07-11: 10:00:00 30 via INTRAVENOUS

## 2014-07-11 MED ORDER — TECHNETIUM TC 99M SESTAMIBI GENERIC - CARDIOLITE
10.0000 | Freq: Once | INTRAVENOUS | Status: AC | PRN
  Administered 2014-07-11: 10 via INTRAVENOUS

## 2014-07-11 MED ORDER — SODIUM CHLORIDE 0.9 % IJ SOLN
10.0000 mL | INTRAMUSCULAR | Status: DC | PRN
  Administered 2014-07-11: 10 mL via INTRAVENOUS

## 2014-07-11 MED ORDER — SODIUM CHLORIDE 0.9 % IJ SOLN
INTRAMUSCULAR | Status: AC
  Administered 2014-07-11: 10 mL via INTRAVENOUS
  Filled 2014-07-11: qty 10

## 2014-07-11 NOTE — Progress Notes (Signed)
Stress Lab Nurses Notes - [name]  [name] 07/11/2014 Reason for doing test: Chest Pain and Dyspnea Type of test: Stress Cardiolite Nurse performing test: Gerrit Halls, RN Nuclear Medicine Tech: Redmond Baseman Echo Tech: Not Applicable MD performing test: Branch/K.Purcell Nails NP Family MD: Moshe Cipro Test explained and consent signed: Yes.   IV started: Saline lock flushed, No redness or edema and Saline lock started in radiology Symptoms: Fatigue Treatment/Intervention: None Reason test stopped: fatigue After recovery IV was: Discontinued via X-ray tech and No redness or edema Patient to return to Nuc. Med at : 10:45 Patient discharged: Home Patient's Condition upon discharge was: stable Comments: During test peak BP 216/107 & HR 173.  Recovery BP 134/93 & HR 105. Symptoms resolved in recovery. Geanie Cooley T

## 2014-07-14 ENCOUNTER — Ambulatory Visit (HOSPITAL_COMMUNITY): Payer: Self-pay

## 2014-07-14 ENCOUNTER — Encounter (HOSPITAL_COMMUNITY): Payer: Self-pay

## 2014-07-15 ENCOUNTER — Telehealth: Payer: Self-pay

## 2014-07-15 MED ORDER — NITROGLYCERIN 0.4 MG SL SUBL: 0.4000 mg | SUBLINGUAL_TABLET | SUBLINGUAL | Status: DC | PRN

## 2014-07-15 NOTE — Telephone Encounter (Signed)
Spoke with pt regarding test results.He will start ECASA 81 mg daily and I have e-scribed NTG rx to [facility]. F/U apt made for 08/06/14 at 11:40

## 2014-07-15 NOTE — Telephone Encounter (Signed)
Message copied by Bernita Raisin on Tue Jul 15, 2014  8:17 AM ------      Message from: Satira Sark      Created: Mon Jul 14, 2014  7:57 PM       Reviewed report.  Overall low risk test as noted. Does not exclude mild inferior (RCA distribution) ischemia, but did well on treadmill and suggests medical therapy would be reasonable unless symptoms worsen. If he can take a coated baby aspirin, would start this - do not take on empty stomach. Also give Rx for NTG SL. Needs followup visit over the next three months, sooner if further problems. ------

## 2014-08-06 ENCOUNTER — Encounter: Payer: Self-pay | Admitting: Cardiology

## 2014-08-06 NOTE — Progress Notes (Signed)
No show  This encounter was created in error - please disregard.

## 2014-08-19 ENCOUNTER — Telehealth: Payer: Self-pay | Admitting: *Deleted

## 2014-08-19 DIAGNOSIS — I10 Essential (primary) hypertension: Secondary | ICD-10-CM

## 2014-08-19 MED ORDER — AMLODIPINE-OLMESARTAN 10-40 MG PO TABS: 1.0000 | ORAL_TABLET | Freq: Every morning | ORAL | Status: DC

## 2014-08-19 MED ORDER — DOXAZOSIN MESYLATE 4 MG PO TABS: ORAL_TABLET | ORAL | Status: DC

## 2014-08-19 MED ORDER — CLONIDINE HCL 0.2 MG PO TABS: 0.2000 mg | ORAL_TABLET | Freq: Two times a day (BID) | ORAL | Status: DC

## 2014-08-19 NOTE — Telephone Encounter (Signed)
Med sent.

## 2014-08-19 NOTE — Telephone Encounter (Signed)
Pt called stating he needs a refill on his blood pressure medication. Please advise

## 2014-09-01 ENCOUNTER — Telehealth: Payer: Self-pay

## 2014-09-01 NOTE — Telephone Encounter (Signed)
Called and left message for patient to return call.  

## 2014-09-05 ENCOUNTER — Other Ambulatory Visit: Payer: Self-pay

## 2014-09-05 DIAGNOSIS — I10 Essential (primary) hypertension: Secondary | ICD-10-CM

## 2014-09-05 MED ORDER — DOXAZOSIN MESYLATE 4 MG PO TABS: ORAL_TABLET | ORAL | Status: DC

## 2014-09-17 ENCOUNTER — Encounter: Payer: Self-pay | Admitting: *Deleted

## 2014-10-01 LAB — CBC
HCT: 40.4 % (ref 39.0–52.0)
HEMOGLOBIN: 13.6 g/dL (ref 13.0–17.0)
MCH: 32.4 pg (ref 26.0–34.0)
MCHC: 33.7 g/dL (ref 30.0–36.0)
MCV: 96.2 fL (ref 78.0–100.0)
Platelets: 275 10*3/uL (ref 150–400)
RBC: 4.2 MIL/uL — ABNORMAL LOW (ref 4.22–5.81)
RDW: 13.2 % (ref 11.5–15.5)
WBC: 4.5 10*3/uL (ref 4.0–10.5)

## 2014-10-01 LAB — COMPLETE METABOLIC PANEL WITH GFR
ALBUMIN: 4.2 g/dL (ref 3.5–5.2)
ALT: 10 U/L (ref 0–53)
AST: 14 U/L (ref 0–37)
Alkaline Phosphatase: 93 U/L (ref 39–117)
BILIRUBIN TOTAL: 0.6 mg/dL (ref 0.2–1.2)
BUN: 21 mg/dL (ref 6–23)
CO2: 28 mEq/L (ref 19–32)
Calcium: 9.2 mg/dL (ref 8.4–10.5)
Chloride: 105 mEq/L (ref 96–112)
Creat: 2 mg/dL — ABNORMAL HIGH (ref 0.50–1.35)
GFR, EST NON AFRICAN AMERICAN: 39 mL/min — AB
GFR, Est African American: 45 mL/min — ABNORMAL LOW
Glucose, Bld: 88 mg/dL (ref 70–99)
POTASSIUM: 4.4 meq/L (ref 3.5–5.3)
Sodium: 142 mEq/L (ref 135–145)
TOTAL PROTEIN: 6.9 g/dL (ref 6.0–8.3)

## 2014-10-01 LAB — LIPID PANEL
Cholesterol: 135 mg/dL (ref 0–200)
HDL: 37 mg/dL — AB (ref >39)
LDL Cholesterol: 78 mg/dL (ref 0–99)
TRIGLYCERIDES: 102 mg/dL (ref <150)
Total CHOL/HDL Ratio: 3.6 Ratio
VLDL: 20 mg/dL (ref 0–40)

## 2014-10-01 LAB — HEMOGLOBIN A1C
HEMOGLOBIN A1C: 5 % (ref <5.7)
Mean Plasma Glucose: 97 mg/dL (ref <117)

## 2014-10-01 LAB — PSA: PSA: 1.27 ng/mL (ref <=4.00)

## 2014-10-09 ENCOUNTER — Telehealth: Payer: Self-pay | Admitting: *Deleted

## 2014-10-09 NOTE — Telephone Encounter (Signed)
Pt called requesting to speak with Loma Sousa. Please advise

## 2014-10-09 NOTE — Telephone Encounter (Signed)
Noted.  Concerns addressed.

## 2014-11-05 ENCOUNTER — Ambulatory Visit (INDEPENDENT_AMBULATORY_CARE_PROVIDER_SITE_OTHER): Payer: BLUE CROSS/BLUE SHIELD | Admitting: Family Medicine

## 2014-11-05 ENCOUNTER — Encounter: Payer: Self-pay | Admitting: Family Medicine

## 2014-11-05 VITALS — BP 128/86 | HR 86 | Resp 16 | Ht 72.0 in | Wt 259.0 lb

## 2014-11-05 DIAGNOSIS — I10 Essential (primary) hypertension: Secondary | ICD-10-CM

## 2014-11-05 DIAGNOSIS — E66811 Obesity, class 1: Secondary | ICD-10-CM

## 2014-11-05 DIAGNOSIS — E669 Obesity, unspecified: Secondary | ICD-10-CM

## 2014-11-05 DIAGNOSIS — G43709 Chronic migraine without aura, not intractable, without status migrainosus: Secondary | ICD-10-CM

## 2014-11-05 DIAGNOSIS — R5383 Other fatigue: Secondary | ICD-10-CM

## 2014-11-05 DIAGNOSIS — G4733 Obstructive sleep apnea (adult) (pediatric): Secondary | ICD-10-CM | POA: Insufficient documentation

## 2014-11-05 DIAGNOSIS — IMO0002 Reserved for concepts with insufficient information to code with codable children: Secondary | ICD-10-CM

## 2014-11-05 DIAGNOSIS — G479 Sleep disorder, unspecified: Secondary | ICD-10-CM

## 2014-11-05 NOTE — Patient Instructions (Addendum)
F/u in 4 month, call if you need me before  You are referred for sleep study as discussed important to keep appt with Dr Merlene Laughter for this   Pls change eating habits, drink only water or zero cal drink  Pls commit to daily exercise for 30 minutes to improve health, your "good" or protective cholesterol is low  No change in BP medications.,  Weight loss goal of 2 pounds per month

## 2014-11-05 NOTE — Progress Notes (Signed)
   Subjective:    Patient ID: [name], male    DOB: August 19, 1971, 44 y.o.   MRN: [id number]  HPI The PT is here for follow up and re-evaluation of chronic medical conditions, medication management and review of any available recent lab and radiology data.  Preventive health is updated, specifically  Cancer screening and Immunization.   Questions or concerns regarding consultations or procedures which the PT has had in the interim are  addressed. The PT denies any adverse reactions to current medications since the last visit.         Review of Systems See HPI Denies recent fever or chills.c/o chjronic fatigue , irritability, long stsanding h/o poor impulse control Denies sinus pressure, nasal congestion, ear pain or sore throat. Denies chest congestion, productive cough or wheezing. Denies chest pains, palpitations and leg swelling Denies abdominal pain, nausea, vomiting,diarrhea or constipation.   Denies dysuria, frequency, hesitancy or incontinence. Denies joint pain, swelling and limitation in mobility. C/o chronic  Headaches, denies  seizures, numbness, or tingling. Denies depression, does have  anxiety and  Insomnia.Also h/o snoring and excessive weight gain Denies skin break down or rash.        Objective:   Physical Exam BP 128/86 mmHg  Pulse 86  Resp 16  Ht 6' (1.829 m)  Wt 259 lb (117.482 kg)  BMI 35.12 kg/m2  SpO2 98% Patient alert and oriented and in no cardiopulmonary distress.  HEENT: No facial asymmetry, EOMI,   oropharynx pink and moist.  Neck supple no JVD, no mass.  Chest: Clear to auscultation bilaterally.  CVS: S1, S2 no murmurs, no S3.Regular rate.  ABD: Soft non tender.   Ext: No edema  MS: Adequate ROM spine, shoulders, hips and knees.  Skin: Intact, no ulcerations or rash noted.  Psych: Good eye contact, normal affect. Memory intact not anxious or depressed appearing.  CNS: CN 2-12 intact, power,  normal throughout.no focal  deficits noted.        Assessment & Plan:  Essential hypertension Sub optimal control, no med change DASH diet and commitment to daily physical activity for a minimum of 30 minutes discussed and encouraged, as a part of hypertension management. The importance of attaining a healthy weight is also discussed.    Fatigue due to sleep pattern disturbance Worsening , needs seelp study referred   Chronic migraine Reports poor control and dependence on pain meds, no interest in seking alternative care like botx at this time treated by neurology   Obesity (BMI 30.0-34.9) Deteriorated. Patient re-educated about  the importance of commitment to a  minimum of 150 minutes of exercise per week. The importance of healthy food choices with portion control discussed. Encouraged to start a food diary, count calories and to consider  joining a support group. Sample diet sheets offered. Goals set by the patient for the next several months.      Sleep disorder Needs eval has classi symptoms of uncontrolled sleep apnnea

## 2014-11-17 ENCOUNTER — Other Ambulatory Visit: Payer: Self-pay

## 2014-11-17 DIAGNOSIS — I10 Essential (primary) hypertension: Secondary | ICD-10-CM

## 2014-11-17 MED ORDER — CLONIDINE HCL 0.2 MG PO TABS: 0.2000 mg | ORAL_TABLET | Freq: Two times a day (BID) | ORAL | Status: DC

## 2014-11-25 NOTE — Assessment & Plan Note (Signed)
Worsening , needs seelp study referred

## 2014-11-25 NOTE — Assessment & Plan Note (Signed)
Reports poor control and dependence on pain meds, no interest in seking alternative care like botx at this time treated by neurology

## 2014-11-25 NOTE — Assessment & Plan Note (Signed)
Needs eval has classi symptoms of uncontrolled sleep apnnea

## 2014-11-25 NOTE — Assessment & Plan Note (Signed)
Deteriorated. Patient re-educated about  the importance of commitment to a  minimum of 150 minutes of exercise per week. The importance of healthy food choices with portion control discussed. Encouraged to start a food diary, count calories and to consider  joining a support group. Sample diet sheets offered. Goals set by the patient for the next several months.    

## 2014-11-25 NOTE — Assessment & Plan Note (Signed)
Sub optimal control, no med change DASH diet and commitment to daily physical activity for a minimum of 30 minutes discussed and encouraged, as a part of hypertension management. The importance of attaining a healthy weight is also discussed.

## 2015-01-01 ENCOUNTER — Telehealth: Payer: Self-pay | Admitting: *Deleted

## 2015-01-01 NOTE — Telephone Encounter (Signed)
Loma Sousa called pt

## 2015-01-01 NOTE — Telephone Encounter (Signed)
Pt called requesting to have a nurse to call him back. Please advise [phone]

## 2015-01-14 ENCOUNTER — Other Ambulatory Visit: Payer: Self-pay

## 2015-01-14 DIAGNOSIS — I10 Essential (primary) hypertension: Secondary | ICD-10-CM

## 2015-01-14 MED ORDER — DOXAZOSIN MESYLATE 4 MG PO TABS: ORAL_TABLET | ORAL | Status: DC

## 2015-02-18 ENCOUNTER — Other Ambulatory Visit: Payer: Self-pay

## 2015-02-18 DIAGNOSIS — I10 Essential (primary) hypertension: Secondary | ICD-10-CM

## 2015-02-18 MED ORDER — CLONIDINE HCL 0.2 MG PO TABS: 0.2000 mg | ORAL_TABLET | Freq: Two times a day (BID) | ORAL | Status: DC

## 2015-03-04 ENCOUNTER — Encounter: Payer: Self-pay | Admitting: Family Medicine

## 2015-03-04 ENCOUNTER — Ambulatory Visit: Payer: BLUE CROSS/BLUE SHIELD | Admitting: Family Medicine

## 2015-03-05 ENCOUNTER — Ambulatory Visit: Payer: Self-pay | Admitting: Family Medicine

## 2015-03-11 ENCOUNTER — Ambulatory Visit: Payer: BLUE CROSS/BLUE SHIELD | Admitting: Family Medicine

## 2015-03-13 ENCOUNTER — Other Ambulatory Visit: Payer: Self-pay

## 2015-03-13 DIAGNOSIS — I10 Essential (primary) hypertension: Secondary | ICD-10-CM

## 2015-03-13 MED ORDER — DOXAZOSIN MESYLATE 4 MG PO TABS: ORAL_TABLET | ORAL | Status: DC

## 2015-03-13 MED ORDER — FUROSEMIDE 20 MG PO TABS: ORAL_TABLET | ORAL | Status: DC

## 2015-04-06 ENCOUNTER — Encounter: Payer: Self-pay | Admitting: Family Medicine

## 2015-04-06 ENCOUNTER — Ambulatory Visit (INDEPENDENT_AMBULATORY_CARE_PROVIDER_SITE_OTHER): Payer: BLUE CROSS/BLUE SHIELD | Admitting: Family Medicine

## 2015-04-06 VITALS — BP 180/96 | HR 100 | Resp 18 | Ht 72.0 in | Wt 252.0 lb

## 2015-04-06 DIAGNOSIS — G43709 Chronic migraine without aura, not intractable, without status migrainosus: Secondary | ICD-10-CM | POA: Diagnosis not present

## 2015-04-06 DIAGNOSIS — IMO0002 Reserved for concepts with insufficient information to code with codable children: Secondary | ICD-10-CM

## 2015-04-06 DIAGNOSIS — E669 Obesity, unspecified: Secondary | ICD-10-CM | POA: Diagnosis not present

## 2015-04-06 DIAGNOSIS — I10 Essential (primary) hypertension: Secondary | ICD-10-CM | POA: Diagnosis not present

## 2015-04-06 NOTE — Patient Instructions (Signed)
Nurse BP re check in 5 weeks  MD follow up in 4.5 month  You NEED to take your medication at same time every day for blood pressure control  Kidney function is adversely affected by your uncontrolled hypertension  Trade cakes and pies for fruit, fresh/frozen  Congrats on 7 pound weight loss  Please It is important that you exercise regularly at least 30 minutes 5 times a week. If you develop chest pain, have severe difficulty breathing, or feel very tired, stop exercising immediately and seek medical attention

## 2015-04-08 ENCOUNTER — Other Ambulatory Visit: Payer: Self-pay

## 2015-04-08 ENCOUNTER — Telehealth: Payer: Self-pay | Admitting: *Deleted

## 2015-04-08 MED ORDER — AMLODIPINE-OLMESARTAN 10-40 MG PO TABS: 1.0000 | ORAL_TABLET | Freq: Every morning | ORAL | Status: DC

## 2015-04-08 NOTE — Telephone Encounter (Signed)
Pt called requesting his blood pressure medication to be refilled, pt wants a call back from the nurse

## 2015-04-08 NOTE — Telephone Encounter (Signed)
Medicine refilled. 

## 2015-04-11 LAB — COMPLETE METABOLIC PANEL WITH GFR
ALT: 9 U/L (ref 0–53)
AST: 13 U/L (ref 0–37)
Albumin: 4 g/dL (ref 3.5–5.2)
Alkaline Phosphatase: 91 U/L (ref 39–117)
BUN: 21 mg/dL (ref 6–23)
CO2: 26 mEq/L (ref 19–32)
CREATININE: 1.95 mg/dL — AB (ref 0.50–1.35)
Calcium: 8.3 mg/dL — ABNORMAL LOW (ref 8.4–10.5)
Chloride: 104 mEq/L (ref 96–112)
GFR, Est African American: 46 mL/min — ABNORMAL LOW
GFR, Est Non African American: 40 mL/min — ABNORMAL LOW
GLUCOSE: 92 mg/dL (ref 70–99)
Potassium: 3.8 mEq/L (ref 3.5–5.3)
SODIUM: 142 meq/L (ref 135–145)
TOTAL PROTEIN: 6.5 g/dL (ref 6.0–8.3)
Total Bilirubin: 0.6 mg/dL (ref 0.2–1.2)

## 2015-04-11 LAB — HIV ANTIBODY (ROUTINE TESTING W REFLEX): HIV 1&2 Ab, 4th Generation: NONREACTIVE

## 2015-04-13 NOTE — Progress Notes (Signed)
   [name]     MRN: [id number]      DOB: Feb 10, 1971   HPI Mr. Bralley is here for follow up and re-evaluation of chronic medical conditions, medication management and review of any available recent lab and radiology data.  Preventive health is updated, specifically  Cancer screening and Immunization.   Questions or concerns regarding consultations or procedures which the PT has had in the interim are  Addressed.Satisfied with headache management by neurology The PT denies any adverse reactions to current medications since the last visit. Unfortunately still taking medication inconsistently. There are no new concerns.  There are no specific complaints   ROS Denies recent fever or chills. Denies sinus pressure, nasal congestion, ear pain or sore throat. Denies chest congestion, productive cough or wheezing. Denies chest pains, palpitations and leg swelling Denies abdominal pain, nausea, vomiting,diarrhea or constipation.   Denies dysuria, frequency, hesitancy or incontinence. Denies joint pain, swelling and limitation in mobility. Denies headaches, seizures, numbness, or tingling. Denies depression, anxiety or insomnia. Denies skin break down or rash.   PE  BP 180/96 mmHg  Pulse 100  Resp 18  Ht 6' (1.829 m)  Wt 252 lb (114.306 kg)  BMI 34.17 kg/m2  SpO2 94%  Patient alert and oriented and in no cardiopulmonary distress.  HEENT: No facial asymmetry, EOMI,   oropharynx pink and moist.  Neck supple no JVD, no mass.  Chest: Clear to auscultation bilaterally.  CVS: S1, S2 no murmurs, no S3.Regular rate.  ABD: Soft non tender.   Ext: No edema  MS: Adequate ROM spine, shoulders, hips and knees.  Skin: Intact, no ulcerations or rash noted.  Psych: Good eye contact, normal affect. Memory intact not anxious or depressed appearing.  CNS: CN 2-12 intact, power,  normal throughout.no focal deficits noted.   Assessment & Plan   Essential hypertension Uncontrolled,  takeing medicatio inconsistently and currently out of amlodipine. Re educated DASH diet and commitment to daily physical activity for a minimum of 30 minutes discussed and encouraged, as a part of hypertension management. The importance of attaining a healthy weight is also discussed.  BP/Weight 04/06/2015 11/05/2014 06/23/2014 06/18/2014 06/14/2014 05/02/2014 [id number]  Systolic BP 99991111 0000000 0000000 123456 [id number] 0000000  Diastolic BP 96 86 90 82 90 84 100  Wt. (Lbs) 252 259 255 245 230 231 224  BMI 34.17 35.12 34.58 33.22 34.98 30.88 29.95      Nurse BP re check in 4 weeks  Chronic migraine Adequately treated by neurology on chronic pain medication  Obesity (BMI 30.0-34.9) improved Patient re-educated about  the importance of commitment to a  minimum of 150 minutes of exercise per week.  The importance of healthy food choices with portion control discussed. Encouraged to start a food diary, count calories and to consider  joining a support group. Sample diet sheets offered. Goals set by the patient for the next several months.   Weight /BMI 04/06/2015 11/05/2014 06/23/2014  WEIGHT 252 lb 259 lb 255 lb  HEIGHT 6\' 0"  6\' 0"  6\' 0"   BMI 34.17 kg/m2 35.12 kg/m2 34.58 kg/m2    Current exercise per week 60 minutes.

## 2015-04-13 NOTE — Assessment & Plan Note (Signed)
Adequately treated by neurology on chronic pain medication

## 2015-04-13 NOTE — Assessment & Plan Note (Signed)
Uncontrolled, takeing medicatio inconsistently and currently out of amlodipine. Re educated DASH diet and commitment to daily physical activity for a minimum of 30 minutes discussed and encouraged, as a part of hypertension management. The importance of attaining a healthy weight is also discussed.  BP/Weight 04/06/2015 11/05/2014 06/23/2014 06/18/2014 06/14/2014 05/02/2014 [id number]  Systolic BP 99991111 0000000 0000000 123456 A999333 [id number] 0000000  Diastolic BP 96 86 90 82 90 84 100  Wt. (Lbs) 252 259 255 245 230 231 224  BMI 34.17 35.12 34.58 33.22 34.98 30.88 29.95      Nurse BP re check in 4 weeks

## 2015-04-13 NOTE — Assessment & Plan Note (Addendum)
improved Patient re-educated about  the importance of commitment to a  minimum of 150 minutes of exercise per week.  The importance of healthy food choices with portion control discussed. Encouraged to start a food diary, count calories and to consider  joining a support group. Sample diet sheets offered. Goals set by the patient for the next several months.   Weight /BMI 04/06/2015 11/05/2014 06/23/2014  WEIGHT 252 lb 259 lb 255 lb  HEIGHT 6\' 0"  6\' 0"  6\' 0"   BMI 34.17 kg/m2 35.12 kg/m2 34.58 kg/m2    Current exercise per week 60 minutes.

## 2015-04-29 ENCOUNTER — Telehealth: Payer: Self-pay | Admitting: Family Medicine

## 2015-04-29 NOTE — Telephone Encounter (Signed)
Patient has medication questions for the nurse

## 2015-04-29 NOTE — Telephone Encounter (Signed)
Aware medications have been called in

## 2015-05-11 ENCOUNTER — Ambulatory Visit: Payer: BLUE CROSS/BLUE SHIELD

## 2015-05-11 VITALS — BP 122/82 | Wt 248.0 lb

## 2015-05-11 DIAGNOSIS — I10 Essential (primary) hypertension: Secondary | ICD-10-CM

## 2015-05-11 NOTE — Progress Notes (Signed)
BP checked and it was within normal range. Aware to continue meds as directed and come in for his next follow up visit and call back with any questions

## 2015-05-21 ENCOUNTER — Ambulatory Visit: Payer: BLUE CROSS/BLUE SHIELD

## 2015-05-21 VITALS — BP 124/82

## 2015-05-21 DIAGNOSIS — I10 Essential (primary) hypertension: Secondary | ICD-10-CM

## 2015-05-21 NOTE — Progress Notes (Signed)
Patient in for blood pressure check.  Patient c/o headache.  Blood pressure within normal range.  Will continue meds as ordered and keep next scheduled appointment.

## 2015-06-09 ENCOUNTER — Telehealth: Payer: Self-pay | Admitting: Orthopedic Surgery

## 2015-06-09 NOTE — Telephone Encounter (Signed)
Patient had questions on if any of his medications may make him feel fatigued.

## 2015-06-09 NOTE — Telephone Encounter (Signed)
[name] has a question about his medications, please advise?

## 2015-06-25 ENCOUNTER — Other Ambulatory Visit: Payer: Self-pay

## 2015-06-25 DIAGNOSIS — I10 Essential (primary) hypertension: Secondary | ICD-10-CM

## 2015-06-25 MED ORDER — CLONIDINE HCL 0.2 MG PO TABS: 0.2000 mg | ORAL_TABLET | Freq: Two times a day (BID) | ORAL | Status: DC

## 2015-06-25 MED ORDER — AMLODIPINE-OLMESARTAN 10-40 MG PO TABS: 1.0000 | ORAL_TABLET | Freq: Every morning | ORAL | Status: DC

## 2015-06-25 MED ORDER — DOXAZOSIN MESYLATE 4 MG PO TABS: ORAL_TABLET | ORAL | Status: DC

## 2015-07-20 ENCOUNTER — Telehealth: Payer: Self-pay | Admitting: Family Medicine

## 2015-07-20 NOTE — Telephone Encounter (Signed)
Patient is asking for a returned phone call

## 2015-07-21 ENCOUNTER — Encounter: Payer: Self-pay | Admitting: Family Medicine

## 2015-07-21 ENCOUNTER — Ambulatory Visit (INDEPENDENT_AMBULATORY_CARE_PROVIDER_SITE_OTHER): Payer: BLUE CROSS/BLUE SHIELD | Admitting: Family Medicine

## 2015-07-21 VITALS — BP 160/100 | HR 70 | Resp 18 | Ht 72.0 in | Wt 243.0 lb

## 2015-07-21 DIAGNOSIS — R7302 Impaired glucose tolerance (oral): Secondary | ICD-10-CM | POA: Diagnosis not present

## 2015-07-21 DIAGNOSIS — E785 Hyperlipidemia, unspecified: Secondary | ICD-10-CM

## 2015-07-21 DIAGNOSIS — Z125 Encounter for screening for malignant neoplasm of prostate: Secondary | ICD-10-CM

## 2015-07-21 DIAGNOSIS — E669 Obesity, unspecified: Secondary | ICD-10-CM

## 2015-07-21 DIAGNOSIS — I10 Essential (primary) hypertension: Secondary | ICD-10-CM

## 2015-07-21 DIAGNOSIS — IMO0002 Reserved for concepts with insufficient information to code with codable children: Secondary | ICD-10-CM

## 2015-07-21 DIAGNOSIS — G43709 Chronic migraine without aura, not intractable, without status migrainosus: Secondary | ICD-10-CM

## 2015-07-21 NOTE — Assessment & Plan Note (Addendum)
Uncontrolled, non compliant, return for reeval in 1 week DASH diet and commitment to daily physical activity for a minimum of 30 minutes discussed and encouraged, as a part of hypertension management. The importance of attaining a healthy weight is also discussed.  BP/Weight 07/21/2015 05/21/2015 05/11/2015 04/06/2015 11/05/2014 06/23/2014 99991111  Systolic BP 0000000 A999333 123XX123 99991111 0000000 0000000 123456  Diastolic BP 123XX123 82 82 96 86 90 82  Wt. (Lbs) 243 - 248 252 259 255 245  BMI 32.95 - 33.63 34.17 35.12 34.58 33.22

## 2015-07-21 NOTE — Telephone Encounter (Signed)
Pt has an appt this am

## 2015-07-21 NOTE — Progress Notes (Signed)
Subjective:    Patient ID: [name], male    DOB: [birth year], 44 y.o.   MRN: [id number]  [place]     MRN: [id number]      DOB: 01/05/1971   HPI [name] is here for follow up and re-evaluation of chronic medical conditions, medication management and review of any available recent lab and radiology data.  Preventive health is updated, specifically  Cancer screening and Immunization.   Questions or concerns regarding consultations or procedures which the PT has had in the interim are  addressed. The PT denies any adverse reactions to current medications since the last visit.  There are no new concerns.  There are no specific complaints   ROS Denies recent fever or chills. Denies sinus pressure, nasal congestion, ear pain or sore throat. Denies chest congestion, productive cough or wheezing. Denies chest pains, palpitations and leg swelling Denies abdominal pain, nausea, vomiting,diarrhea or constipation.   Denies dysuria, frequency, hesitancy or incontinence. Denies joint pain, swelling and limitation in mobility. Denies headaches, seizures, numbness, or tingling. Denies depression, anxiety or insomnia. Denies skin break down or rash.   PE  BP 160/100 mmHg  Pulse 70  Resp 18  Ht 6' (1.829 m)  Wt 243 lb (110.224 kg)  BMI 32.95 kg/m2  SpO2 98%  Patient alert and oriented and in no cardiopulmonary distress.  HEENT: No facial asymmetry, EOMI,   oropharynx pink and moist.  Neck supple no JVD, no mass.  Chest: Clear to auscultation bilaterally.  CVS: S1, S2 no murmurs, no S3.Regular rate.  ABD: Soft non tender.   Ext: No edema  MS: Adequate ROM spine, shoulders, hips and knees.  Skin: Intact, no ulcerations or rash noted.  Psych: Good eye contact, normal affect. Memory intact not anxious or depressed appearing.  CNS: CN 2-12 intact, power,  normal throughout.no focal deficits noted.   Assessment & Plan   Essential  hypertension Uncontrolled, non compliant, return for reeval in 1 week DASH diet and commitment to daily physical activity for a minimum of 30 minutes discussed and encouraged, as a part of hypertension management. The importance of attaining a healthy weight is also discussed.  BP/Weight 07/21/2015 05/21/2015 05/11/2015 04/06/2015 11/05/2014 06/23/2014 99991111  Systolic BP 0000000 A999333 123XX123 99991111 0000000 0000000 123456  Diastolic BP 123XX123 82 82 96 86 90 82  Wt. (Lbs) 243 - 248 252 259 255 245  BMI 32.95 - 33.63 34.17 35.12 34.58 33.22        Obesity (BMI 30.0-34.9) Improvedd. Patient re-educated about  the importance of commitment to a  minimum of 150 minutes of exercise per week.  The importance of healthy food choices with portion control discussed. Encouraged to start a food diary, count calories and to consider  joining a support group. Sample diet sheets offered. Goals set by the patient for the next several months.   Weight /BMI 07/21/2015 05/11/2015 04/06/2015  WEIGHT 243 lb 248 lb 252 lb  HEIGHT 6\' 0"  - 6\' 0"   BMI 32.95 kg/m2 33.63 kg/m2 34.17 kg/m2    Current exercise per week 90 mins   Chronic migraine Stable and controlled, managed by neurology  Hyperlipidemia LDL goal <100 Hyperlipidemia:Low fat diet discussed and encouraged.   Lipid Panel  Lab Results  Component Value Date   CHOL 135 09/30/2014   HDL 37* 09/30/2014   LDLCALC 78 09/30/2014   TRIG 102 09/30/2014   CHOLHDL 3.6 09/30/2014  Updated lab needed at/ before next  visit.        *    Review of Systems     Objective:   Physical Exam        Assessment & Plan:

## 2015-07-21 NOTE — Patient Instructions (Addendum)
Nurse BP check in 1 week, return with 3 stool cards for testing in 1 week  MD f/u in 4 month  NEED TO TAKE BP mEDS EVERY DAY AS pRESCRIBED, KIDNEYS WILL FAIL IF YOU DO NOT!!!  Fasting lipid, cmp and EGFR, HBA1C TSH a,d PSA Jan 14 or after  CONGRATS on weight loss and improved anger management  Please work on good  health habits so that your health will improve. 1. Commitment to daily physical activity for 30 to 60  minutes, if you are able to do this.  2. Commitment to wise food choices. Aim for half of your  food intake to be vegetable and fruit, one quarter starchy foods, and one quarter protein. Try to eat on a regular schedule  3 meals per day, snacking between meals should be limited to vegetables or fruits or small portions of nuts. 64 ounces of water per day is generally recommended, unless you have specific health conditions, like heart failure or kidney failure where you will need to limit fluid intake.  3. Commitment to sufficient and a  good quality of physical and mental rest daily, generally between 6 to 8 hours per day.  WITH PERSISTANCE AND PERSEVERANCE, THE IMPOSSIBLE , BECOMES THE NORM!  Thanks for choosing [facility], we consider it a privelige to serve you.

## 2015-07-23 ENCOUNTER — Telehealth: Payer: Self-pay | Admitting: Orthopedic Surgery

## 2015-07-23 NOTE — Telephone Encounter (Signed)
Spoke with patient, he understands

## 2015-07-23 NOTE — Telephone Encounter (Signed)
[name] would like to speak to a nurse he has a question

## 2015-07-28 ENCOUNTER — Telehealth: Payer: Self-pay | Admitting: Orthopedic Surgery

## 2015-07-28 NOTE — Telephone Encounter (Signed)
Concern addressed.  Patient will reschedule nurse visit.

## 2015-07-28 NOTE — Telephone Encounter (Signed)
[name] is asking for a returned call from [facility]

## 2015-08-04 ENCOUNTER — Telehealth: Payer: Self-pay | Admitting: Family Medicine

## 2015-08-04 NOTE — Telephone Encounter (Signed)
Patient aware that he needs to come in for nurse blood pressure check.

## 2015-08-04 NOTE — Telephone Encounter (Signed)
Called and left message for patient to return call.  

## 2015-08-04 NOTE — Telephone Encounter (Signed)
Please call [name] on his cell, he has questions regarding his blood pressure

## 2015-08-05 ENCOUNTER — Telehealth: Payer: Self-pay | Admitting: Family Medicine

## 2015-08-05 NOTE — Telephone Encounter (Signed)
Patient has questions about the test he is going to be taking and also his BP Check

## 2015-08-06 NOTE — Telephone Encounter (Signed)
Spoke with patient, he is coming in for a bP check

## 2015-08-07 ENCOUNTER — Telehealth: Payer: Self-pay | Admitting: Family Medicine

## 2015-08-07 NOTE — Telephone Encounter (Signed)
Patient is asking to speak to a nurse Velna Hatchet or Loma Sousa

## 2015-08-07 NOTE — Telephone Encounter (Signed)
Concern addressed

## 2015-08-08 DIAGNOSIS — E785 Hyperlipidemia, unspecified: Secondary | ICD-10-CM | POA: Insufficient documentation

## 2015-08-08 NOTE — Assessment & Plan Note (Signed)
Hyperlipidemia:Low fat diet discussed and encouraged.   Lipid Panel  Lab Results  Component Value Date   CHOL 135 09/30/2014   HDL 37* 09/30/2014   LDLCALC 78 09/30/2014   TRIG 102 09/30/2014   CHOLHDL 3.6 09/30/2014  Updated lab needed at/ before next visit.

## 2015-08-08 NOTE — Assessment & Plan Note (Signed)
Improvedd. Patient re-educated about  the importance of commitment to a  minimum of 150 minutes of exercise per week.  The importance of healthy food choices with portion control discussed. Encouraged to start a food diary, count calories and to consider  joining a support group. Sample diet sheets offered. Goals set by the patient for the next several months.   Weight /BMI 07/21/2015 05/11/2015 04/06/2015  WEIGHT 243 lb 248 lb 252 lb  HEIGHT 6\' 0"  - 6\' 0"   BMI 32.95 kg/m2 33.63 kg/m2 34.17 kg/m2    Current exercise per week 90 mins

## 2015-08-08 NOTE — Assessment & Plan Note (Signed)
Stable and controlled, managed by neurology

## 2015-08-27 ENCOUNTER — Ambulatory Visit: Payer: BLUE CROSS/BLUE SHIELD

## 2015-08-27 VITALS — BP 144/82

## 2015-08-27 DIAGNOSIS — I10 Essential (primary) hypertension: Secondary | ICD-10-CM

## 2015-08-27 NOTE — Progress Notes (Signed)
Patient in for nurse blood pressure check .  Blood pressure checked manually.   Improvement in blood pressure.  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  Will start to take Cardura in the AM since it makes him sleepy

## 2015-08-31 ENCOUNTER — Emergency Department (HOSPITAL_COMMUNITY)
Admission: EM | Admit: 2015-08-31 | Discharge: 2015-08-31 | Disposition: A | Discharge status: Home / Self Care | Payer: BLUE CROSS/BLUE SHIELD | Attending: Emergency Medicine | Admitting: Emergency Medicine

## 2015-08-31 ENCOUNTER — Encounter (HOSPITAL_COMMUNITY): Payer: Self-pay | Admitting: Emergency Medicine

## 2015-08-31 DIAGNOSIS — Z87891 Personal history of nicotine dependence: Secondary | ICD-10-CM | POA: Insufficient documentation

## 2015-08-31 DIAGNOSIS — Z8719 Personal history of other diseases of the digestive system: Secondary | ICD-10-CM | POA: Diagnosis not present

## 2015-08-31 DIAGNOSIS — G8929 Other chronic pain: Secondary | ICD-10-CM | POA: Insufficient documentation

## 2015-08-31 DIAGNOSIS — I10 Essential (primary) hypertension: Secondary | ICD-10-CM | POA: Insufficient documentation

## 2015-08-31 DIAGNOSIS — R519 Headache, unspecified: Secondary | ICD-10-CM

## 2015-08-31 DIAGNOSIS — Z79899 Other long term (current) drug therapy: Secondary | ICD-10-CM | POA: Diagnosis not present

## 2015-08-31 DIAGNOSIS — R51 Headache: Secondary | ICD-10-CM | POA: Diagnosis not present

## 2015-08-31 DIAGNOSIS — E669 Obesity, unspecified: Secondary | ICD-10-CM | POA: Insufficient documentation

## 2015-08-31 MED ORDER — SODIUM CHLORIDE 0.9 % IV BOLUS (SEPSIS)
1000.0000 mL | Freq: Once | INTRAVENOUS | Status: AC
  Administered 2015-08-31: 1000 mL via INTRAVENOUS

## 2015-08-31 MED ORDER — HYDROMORPHONE HCL 1 MG/ML IJ SOLN
1.0000 mg | Freq: Once | INTRAMUSCULAR | Status: AC
  Administered 2015-08-31: 1 mg via INTRAVENOUS
  Filled 2015-08-31: qty 1

## 2015-08-31 MED ORDER — DIPHENHYDRAMINE HCL 50 MG/ML IJ SOLN
25.0000 mg | Freq: Once | INTRAMUSCULAR | Status: AC
  Administered 2015-08-31: 25 mg via INTRAVENOUS
  Filled 2015-08-31: qty 1

## 2015-08-31 MED ORDER — METOCLOPRAMIDE HCL 5 MG/ML IJ SOLN
10.0000 mg | Freq: Once | INTRAMUSCULAR | Status: AC
  Administered 2015-08-31: 10 mg via INTRAVENOUS
  Filled 2015-08-31: qty 2

## 2015-08-31 NOTE — ED Provider Notes (Signed)
CSN: [id number]     Arrival date & time 08/31/15  [id number] History  By signing my name below, I, Tula Nakayama, attest that this documentation has been prepared under the direction and in the presence of Noemi Chapel, MD.  Electronically Signed: Tula Nakayama, ED Scribe. 08/31/2015. 8:30 AM.  Chief Complaint  Patient presents with  . Headache   The history is provided by the patient. No language interpreter was used.    HPI Comments: [name] is a 44 y.o. male who presents to the Emergency Department complaining of constant, moderate left-sided HA that started this morning. He reports decreased balance with ambulation, that is currently resolved, as an associated symptom. Pt has not tried any treatment PTA. Pt reports a history of similar HA, but states current HA differs in its location. He drove his children to school after onset of HA with no difficulty. Pt last took medication for HTN this morning. Pt denies recent head injuries, CO leaks in his home and recent EtOH use. He also denies visual disturbances, numbness or weakness in his legs, nausea, CP and SOB.  Past Medical History  Diagnosis Date  . Chronic back pain   . Headache(784.0)   . Essential hypertension, benign   . Duodenitis with bleeding 08/2008    Ulcer - admitted to [facility]   . GERD (gastroesophageal reflux disease)   . Hyperlipidemia   . Obesity, unspecified    Past Surgical History  Procedure Laterality Date  . Foot fracture surgery    . Finger debridement Left 09/25/2013    Thumb  . 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 Diabetes Mother   . Hypertension Mother   . Hypertension Father   . Arthritis Father   . Diabetes Father    Social History  Substance Use Topics  .  Smoking status: Former Smoker -- 0.10 packs/day    Types: Cigarettes    Quit date: 05/28/2012  . Smokeless tobacco: Never Used  . Alcohol Use: No    Review of Systems  Eyes: Negative for visual disturbance.  Respiratory: Negative for shortness of breath.   Cardiovascular: Negative for chest pain.  Gastrointestinal: Negative for nausea.  Neurological: Positive for headaches. Negative for weakness and numbness.  All other systems reviewed and are negative.  Allergies  Bee venom; Prochlorperazine edisylate; Cyclobenzaprine hcl; Aleve; Carbamazepine; Geodon; Ibuprofen; Maxzide; Nsaids; Tylenol; and Other  Home Medications   Prior to Admission medications   Medication Sig Start Date End Date Taking? Authorizing Provider  amLODipine-olmesartan (AZOR) 10-40 MG tablet Take 1 tablet by mouth every morning. 06/25/15  Yes Fayrene Helper, MD  cloNIDine (CATAPRES) 0.2 MG tablet Take 1 tablet (0.2 mg total) by mouth 2 (two) times daily. 06/25/15  Yes Fayrene Helper, MD  doxazosin (CARDURA) 4 MG tablet One tablet at bedtime for high blood pressure 06/25/15  Yes Fayrene Helper, MD  furosemide (LASIX) 20 MG tablet One tablet once daily, as needed, for leg swelling 03/13/15  Yes Fayrene Helper, MD  hydrOXYzine (ATARAX/VISTARIL) 50 MG tablet Take 50 mg by mouth daily.  03/31/15  Yes Historical Provider, MD  nitroGLYCERIN (NITROSTAT) 0.4 MG SL tablet Place 1 tablet (0.4 mg total) under the tongue every 5 (five) minutes as needed for  chest pain. 07/15/14  Yes Satira Sark, MD  oxyCODONE-acetaminophen (PERCOCET) 7.5-325 MG per tablet Take 1 tablet by mouth 2 (two) times daily. 03/18/14  Yes Tarry Kos, NP   BP 124/85 mmHg  Pulse 58  Temp(Src) 98.2 F (36.8 C) (Oral)  Resp 16  Ht 5\' 9"  (1.753 m)  Wt 245 lb (111.131 kg)  BMI 36.16 kg/m2  SpO2 97% Physical Exam  Constitutional: He appears well-developed and well-nourished. No distress.  HENT:  Head: Normocephalic and  atraumatic.  Mouth/Throat: Oropharynx is clear and moist. No oropharyngeal exudate.  TTP left face involving the temple, zygoma and TMJ  Eyes: Conjunctivae and EOM are normal. Pupils are equal, round, and reactive to light. Right eye exhibits no discharge. Left eye exhibits no discharge. No scleral icterus.  Neck: Normal range of motion. Neck supple. No JVD present. No thyromegaly present.  Cardiovascular: Normal rate, regular rhythm, normal heart sounds and intact distal pulses.  Exam reveals no gallop and no friction rub.   No murmur heard. Pulmonary/Chest: Effort normal and breath sounds normal. No respiratory distress. He has no wheezes. He has no rales.  Abdominal: Soft. Bowel sounds are normal. He exhibits no distension and no mass. There is no tenderness.  Musculoskeletal: Normal range of motion. He exhibits no edema or tenderness.  Lymphadenopathy:    He has no cervical adenopathy.  Neurological: He is alert. Coordination normal.  Neurologic exam:  Speech clear, pupils equal round reactive to light, extraocular movements intact  Normal peripheral visual fields Cranial nerves III through XII normal including no facial droop Follows commands, moves all extremities x4, normal strength to bilateral upper and lower extremities at all major muscle groups including grip Sensation normal to light touch and pinprick Coordination intact, no limb ataxia, finger-nose-finger normal Rapid alternating movements normal No pronator drift Gait normal   Skin: Skin is warm and dry. No rash noted. No erythema.  Psychiatric: He has a normal mood and affect. His behavior is normal.  Nursing note and vitals reviewed.   ED Course  Procedures  DIAGNOSTIC STUDIES: Oxygen Saturation is 96% on RA, normal by my interpretation.    COORDINATION OF CARE: 8:43 AM Discussed treatment plan with pt which includes a migraine cocktail. He agreed to plan.  Labs Review Labs Reviewed - No data to  display  Imaging Review No results found.    MDM   Final diagnoses:  Nonintractable headache, unspecified chronicity pattern, unspecified headache type    Well appaering, VS normal - has normal neuro on reexam and better after meds - suspect migraine type headache - he has been told by his PCP to go to the pain clinic after he was requesting fentanyl patches for his headches - he declines Rx meds here, stable gait.  Meds given in ED:  Medications  metoCLOPramide (REGLAN) injection 10 mg (10 mg Intravenous Given 08/31/15 0854)  diphenhydrAMINE (BENADRYL) injection 25 mg (25 mg Intravenous Given 08/31/15 0854)  sodium chloride 0.9 % bolus 1,000 mL (0 mLs Intravenous Stopped 08/31/15 1018)  HYDROmorphone (DILAUDID) injection 1 mg (1 mg Intravenous Given 08/31/15 0854)    New Prescriptions   No medications on file      I personally performed the services described in this documentation, which was scribed in my presence. The recorded information has been reviewed and is accurate.       Noemi Chapel, MD 08/31/15 1154

## 2015-08-31 NOTE — ED Notes (Signed)
Pt sleeping soundly at this time.  Not disturbed.

## 2015-08-31 NOTE — ED Notes (Signed)
Started with severe headache about 7 am.  Dr Sabra Heck in for assessment.

## 2015-08-31 NOTE — Discharge Instructions (Signed)

## 2015-08-31 NOTE — ED Notes (Signed)
Pt sleeping soundly at this time.

## 2015-09-23 LAB — COMPLETE METABOLIC PANEL WITH GFR
ALK PHOS: 81 U/L (ref 40–115)
ALT: 6 U/L — AB (ref 9–46)
AST: 13 U/L (ref 10–40)
Albumin: 4.1 g/dL (ref 3.6–5.1)
BUN: 19 mg/dL (ref 7–25)
CALCIUM: 8.4 mg/dL — AB (ref 8.6–10.3)
CHLORIDE: 107 mmol/L (ref 98–110)
CO2: 30 mmol/L (ref 20–31)
Creat: 1.9 mg/dL — ABNORMAL HIGH (ref 0.60–1.35)
GFR, EST AFRICAN AMERICAN: 48 mL/min — AB (ref >=60)
GFR, EST NON AFRICAN AMERICAN: 41 mL/min — AB (ref >=60)
Glucose, Bld: 93 mg/dL (ref 65–99)
POTASSIUM: 5 mmol/L (ref 3.5–5.3)
Sodium: 142 mmol/L (ref 135–146)
Total Bilirubin: 0.5 mg/dL (ref 0.2–1.2)
Total Protein: 6.1 g/dL (ref 6.1–8.1)

## 2015-09-23 LAB — LIPID PANEL
CHOLESTEROL: 103 mg/dL — AB (ref 125–200)
HDL: 36 mg/dL — AB (ref >=40)
LDL Cholesterol: 54 mg/dL (ref <130)
TRIGLYCERIDES: 63 mg/dL (ref <150)
Total CHOL/HDL Ratio: 2.9 Ratio (ref <=5.0)
VLDL: 13 mg/dL (ref <30)

## 2015-09-23 LAB — TSH: TSH: 1.382 u[IU]/mL (ref 0.350–4.500)

## 2015-09-23 LAB — HEMOGLOBIN A1C
HEMOGLOBIN A1C: 5.2 % (ref <5.7)
MEAN PLASMA GLUCOSE: 103 mg/dL (ref <117)

## 2015-09-23 LAB — PSA: PSA: 1.57 ng/mL (ref <=4.00)

## 2015-09-25 ENCOUNTER — Ambulatory Visit: Payer: BLUE CROSS/BLUE SHIELD

## 2015-09-25 VITALS — BP 136/80

## 2015-09-25 DIAGNOSIS — I10 Essential (primary) hypertension: Secondary | ICD-10-CM

## 2015-09-25 NOTE — Progress Notes (Signed)
Patient in for nurse visit for blood pressure check.  Blood pressure within normal range.   Will keep next scheduled visit.   Medications as previously ordered.

## 2015-10-13 ENCOUNTER — Emergency Department (HOSPITAL_COMMUNITY)
Admission: EM | Admit: 2015-10-13 | Discharge: 2015-10-13 | Disposition: A | Discharge status: Home / Self Care | Payer: BLUE CROSS/BLUE SHIELD | Attending: Emergency Medicine | Admitting: Emergency Medicine

## 2015-10-13 ENCOUNTER — Encounter (HOSPITAL_COMMUNITY): Payer: Self-pay | Admitting: *Deleted

## 2015-10-13 ENCOUNTER — Emergency Department (HOSPITAL_COMMUNITY): Payer: BLUE CROSS/BLUE SHIELD

## 2015-10-13 DIAGNOSIS — G8929 Other chronic pain: Secondary | ICD-10-CM | POA: Diagnosis not present

## 2015-10-13 DIAGNOSIS — Z8719 Personal history of other diseases of the digestive system: Secondary | ICD-10-CM | POA: Insufficient documentation

## 2015-10-13 DIAGNOSIS — Z87891 Personal history of nicotine dependence: Secondary | ICD-10-CM | POA: Insufficient documentation

## 2015-10-13 DIAGNOSIS — E669 Obesity, unspecified: Secondary | ICD-10-CM | POA: Insufficient documentation

## 2015-10-13 DIAGNOSIS — I10 Essential (primary) hypertension: Secondary | ICD-10-CM | POA: Insufficient documentation

## 2015-10-13 DIAGNOSIS — Z79899 Other long term (current) drug therapy: Secondary | ICD-10-CM | POA: Diagnosis not present

## 2015-10-13 DIAGNOSIS — G43009 Migraine without aura, not intractable, without status migrainosus: Secondary | ICD-10-CM

## 2015-10-13 DIAGNOSIS — R112 Nausea with vomiting, unspecified: Secondary | ICD-10-CM

## 2015-10-13 DIAGNOSIS — R51 Headache: Secondary | ICD-10-CM | POA: Diagnosis present

## 2015-10-13 LAB — COMPREHENSIVE METABOLIC PANEL
ALK PHOS: 87 U/L (ref 38–126)
ALT: 10 U/L — AB (ref 17–63)
AST: 19 U/L (ref 15–41)
Albumin: 3.7 g/dL (ref 3.5–5.0)
Anion gap: 8 (ref 5–15)
BUN: 17 mg/dL (ref 6–20)
CALCIUM: 8.4 mg/dL — AB (ref 8.9–10.3)
CHLORIDE: 107 mmol/L (ref 101–111)
CO2: 26 mmol/L (ref 22–32)
CREATININE: 1.72 mg/dL — AB (ref 0.61–1.24)
GFR calc Af Amer: 53 mL/min — ABNORMAL LOW (ref >60)
GFR, EST NON AFRICAN AMERICAN: 46 mL/min — AB (ref >60)
Glucose, Bld: 128 mg/dL — ABNORMAL HIGH (ref 65–99)
Potassium: 4.2 mmol/L (ref 3.5–5.1)
Sodium: 141 mmol/L (ref 135–145)
Total Bilirubin: 0.6 mg/dL (ref 0.3–1.2)
Total Protein: 6.8 g/dL (ref 6.5–8.1)

## 2015-10-13 LAB — CBC WITH DIFFERENTIAL/PLATELET
Basophils Absolute: 0 10*3/uL (ref 0.0–0.1)
Basophils Relative: 0 %
EOS ABS: 0 10*3/uL (ref 0.0–0.7)
EOS PCT: 0 %
HCT: 36.5 % — ABNORMAL LOW (ref 39.0–52.0)
Hemoglobin: 12.5 g/dL — ABNORMAL LOW (ref 13.0–17.0)
LYMPHS ABS: 0.4 10*3/uL — AB (ref 0.7–4.0)
LYMPHS PCT: 6 %
MCH: 32.6 pg (ref 26.0–34.0)
MCHC: 34.2 g/dL (ref 30.0–36.0)
MCV: 95.1 fL (ref 78.0–100.0)
MONO ABS: 0.1 10*3/uL (ref 0.1–1.0)
MONOS PCT: 2 %
Neutro Abs: 5.7 10*3/uL (ref 1.7–7.7)
Neutrophils Relative %: 92 %
PLATELETS: 224 10*3/uL (ref 150–400)
RBC: 3.84 MIL/uL — ABNORMAL LOW (ref 4.22–5.81)
RDW: 11.2 % — ABNORMAL LOW (ref 11.5–15.5)
WBC: 6.1 10*3/uL (ref 4.0–10.5)

## 2015-10-13 LAB — CARBOXYHEMOGLOBIN
CARBOXYHEMOGLOBIN: 2 % — AB (ref 0.5–1.5)
Methemoglobin: 0.9 % (ref 0.0–1.5)
O2 SAT: 95.5 %
TOTAL HEMOGLOBIN: 12.4 g/dL — AB (ref 13.5–18.0)
Total oxygen content: 16.2 mL/dL (ref 15.0–23.0)

## 2015-10-13 MED ORDER — MAGNESIUM SULFATE 2 GM/50ML IV SOLN
2.0000 g | Freq: Once | INTRAVENOUS | Status: AC
  Administered 2015-10-13: 2 g via INTRAVENOUS
  Filled 2015-10-13: qty 50

## 2015-10-13 MED ORDER — VALPROATE SODIUM 500 MG/5ML IV SOLN
1000.0000 mg | Freq: Once | INTRAVENOUS | Status: AC
  Administered 2015-10-13: 1000 mg via INTRAVENOUS
  Filled 2015-10-13: qty 10

## 2015-10-13 MED ORDER — ONDANSETRON HCL 4 MG/2ML IJ SOLN
4.0000 mg | Freq: Once | INTRAMUSCULAR | Status: AC
  Administered 2015-10-13: 4 mg via INTRAMUSCULAR
  Filled 2015-10-13: qty 2

## 2015-10-13 MED ORDER — OXYCODONE-ACETAMINOPHEN 5-325 MG PO TABS: 1.0000 | ORAL_TABLET | Freq: Four times a day (QID) | ORAL | Status: DC | PRN

## 2015-10-13 MED ORDER — DIPHENHYDRAMINE HCL 50 MG/ML IJ SOLN
25.0000 mg | Freq: Once | INTRAMUSCULAR | Status: AC
  Administered 2015-10-13: 25 mg via INTRAVENOUS
  Filled 2015-10-13: qty 1

## 2015-10-13 MED ORDER — SODIUM CHLORIDE 0.9 % IV SOLN
1000.0000 mL | INTRAVENOUS | Status: DC
  Administered 2015-10-13: 1000 mL via INTRAVENOUS

## 2015-10-13 MED ORDER — LORAZEPAM 2 MG/ML IJ SOLN
0.5000 mg | Freq: Once | INTRAMUSCULAR | Status: AC
  Administered 2015-10-13: 0.5 mg via INTRAVENOUS
  Filled 2015-10-13: qty 1

## 2015-10-13 MED ORDER — METOCLOPRAMIDE HCL 5 MG/ML IJ SOLN
10.0000 mg | Freq: Once | INTRAMUSCULAR | Status: AC
  Administered 2015-10-13: 10 mg via INTRAVENOUS
  Filled 2015-10-13: qty 2

## 2015-10-13 MED ORDER — DEXAMETHASONE SODIUM PHOSPHATE 10 MG/ML IJ SOLN
10.0000 mg | Freq: Once | INTRAMUSCULAR | Status: AC
  Administered 2015-10-13: 10 mg via INTRAVENOUS
  Filled 2015-10-13: qty 1

## 2015-10-13 MED ORDER — SODIUM CHLORIDE 0.9 % IV BOLUS (SEPSIS)
1000.0000 mL | Freq: Once | INTRAVENOUS | Status: AC
Start: 2015-10-13 — End: 2015-10-13
  Administered 2015-10-13: 1000 mL via INTRAVENOUS

## 2015-10-13 MED ORDER — SODIUM CHLORIDE 0.9 % IV SOLN
1000.0000 mL | Freq: Once | INTRAVENOUS | Status: AC
  Administered 2015-10-13: 1000 mL via INTRAVENOUS

## 2015-10-13 MED ORDER — HYDROMORPHONE HCL 1 MG/ML IJ SOLN
1.0000 mg | Freq: Once | INTRAMUSCULAR | Status: AC
  Administered 2015-10-13: 1 mg via INTRAVENOUS
  Filled 2015-10-13: qty 1

## 2015-10-13 NOTE — ED Notes (Signed)
Pt c/o generalized body aches with fever, chills and headache x 1 day

## 2015-10-13 NOTE — ED Notes (Signed)
Depacon not available in ER.  Awaiting pharmacy to fill.

## 2015-10-13 NOTE — ED Provider Notes (Signed)
CSN: [id number]     Arrival date & time 10/13/15  0434 History   First MD Initiated Contact with Patient 10/13/15 0535   Chief Complaint  Patient presents with  . Generalized Body Aches     (Consider location/radiation/quality/duration/timing/severity/associated sxs/prior Treatment) HPI patient reports he started feeling bad on January 21. He states he has a headache that is in the forehead and temples that is throbbing with light sensitivity. He does not have noise sensitivity. He states this is similar to headaches he's had in the past however they normally do not last this long. He has had nausea but only started vomiting after he got to the ED. He denies diarrhea or abdominal pain. He denies any neck pain. He states he felt like he had fever on January 23 because he felt dizzy with loss of appetite and states he was just laying around. He however did not check his temperature. He states he has been having some chills. He denies sore throat or coughing. He states his daughter has also been complaining of a headache recently. He does state they have gas heat in their house.  PCP Dr Bari Mantis  Past Medical History  Diagnosis Date  . Chronic back pain   . Headache(784.0)   . Essential hypertension, benign   . Duodenitis with bleeding 08/2008    Ulcer - admitted to [facility]   . GERD (gastroesophageal reflux disease)   . Hyperlipidemia   . Obesity, unspecified    Past Surgical History  Procedure Laterality Date  . Foot fracture surgery    . Finger debridement Left 09/25/2013    Thumb  . 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 Diabetes Mother   . Hypertension Mother   . Hypertension Father   . Arthritis Father   . Diabetes Father    Social History   Substance Use Topics  . Smoking status: Former Smoker -- 0.10 packs/day    Types: Cigarettes    Quit date: 05/28/2012  . Smokeless tobacco: Never Used  . 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     Allergies  Bee venom; Prochlorperazine edisylate; Cyclobenzaprine hcl; Aleve; Carbamazepine; Geodon; Ibuprofen; Maxzide; Nsaids; Tylenol; and Other  Home Medications   Prior to Admission medications   Medication Sig Start Date End Date Taking? Authorizing Provider  amLODipine-olmesartan (AZOR) 10-40 MG tablet Take 1 tablet by mouth every morning. 06/25/15   Fayrene Helper, MD  cloNIDine (CATAPRES) 0.2 MG tablet Take 1 tablet (0.2 mg total) by mouth 2 (two) times daily. 06/25/15   Fayrene Helper, MD  doxazosin (CARDURA) 4 MG tablet One tablet at bedtime for high blood pressure 06/25/15   Fayrene Helper, MD  furosemide (LASIX) 20 MG tablet One tablet once daily, as needed, for leg swelling 03/13/15   Fayrene Helper, MD  hydrOXYzine (ATARAX/VISTARIL) 50 MG tablet Take 50 mg by mouth daily.  03/31/15   Historical Provider, MD  nitroGLYCERIN (NITROSTAT) 0.4 MG SL tablet Place 1 tablet (0.4 mg total) under the tongue every 5 (five) minutes as needed for chest pain. 07/15/14   Satira Sark, MD  oxyCODONE-acetaminophen (PERCOCET) 7.5-325 MG per tablet Take 1 tablet by mouth  2 (two) times daily. 03/18/14   Tarry Kos, NP   BP 155/109 mmHg  Pulse 96  Temp(Src) 98.7 F (37.1 C) (Oral)  Resp 18  Wt 245 lb (111.131 kg)  SpO2 96%  Vital signs normal except for hypertension  Physical Exam  Constitutional: He is oriented to person, place, and time. He appears well-developed and well-nourished.  Non-toxic appearance. He does not appear ill. No distress.  Patient actively vomiting when I enter the room.  HENT:  Head: Normocephalic and atraumatic.  Right Ear: External ear normal.  Left Ear: External ear  normal.  Nose: Nose normal. No mucosal edema or rhinorrhea.  Mouth/Throat: Oropharynx is clear and moist and mucous membranes are normal. No dental abscesses or uvula swelling.  Eyes: Conjunctivae and EOM are normal. Pupils are equal, round, and reactive to light.  Neck: Normal range of motion and full passive range of motion without pain. Neck supple.  Cardiovascular: Normal rate, regular rhythm and normal heart sounds.  Exam reveals no gallop and no friction rub.   No murmur heard. Pulmonary/Chest: Effort normal and breath sounds normal. No respiratory distress. He has no wheezes. He has no rhonchi. He has no rales. He exhibits no tenderness and no crepitus.  Abdominal: Soft. Normal appearance and bowel sounds are normal. He exhibits no distension. There is no tenderness. There is no rebound and no guarding.  Musculoskeletal: Normal range of motion. He exhibits no edema or tenderness.  Moves all extremities well.   Neurological: He is alert and oriented to person, place, and time. He has normal strength. No cranial nerve deficit.  Skin: Skin is warm, dry and intact. No rash noted. No erythema. No pallor.  Psychiatric: He has a normal mood and affect. His speech is normal and behavior is normal. His mood appears not anxious.  Nursing note and vitals reviewed.   ED Course  Procedures (including critical care time)  Medications  0.9 %  sodium chloride infusion (1,000 mLs Intravenous New Bag/Given 10/13/15 0615)    Followed by  0.9 %  sodium chloride infusion (not administered)  ondansetron (ZOFRAN) injection 4 mg (4 mg Intramuscular Given 10/13/15 0545)  metoCLOPramide (REGLAN) injection 10 mg (10 mg Intravenous Given 10/13/15 0616)  diphenhydrAMINE (BENADRYL) injection 25 mg (25 mg Intravenous Given 10/13/15 0616)  dexamethasone (DECADRON) injection 10 mg (10 mg Intravenous Given 10/13/15 0616)  magnesium sulfate IVPB 2 g 50 mL (2 g Intravenous New Bag/Given 10/13/15 0718)    Patient was  given IV fluids and migraine cocktail. A carboxyhemoglobin level was ordered to make sure he is not having carbon monoxide poisoning.  Recheck at 07:00 pt seems more comfortable, but states his headache is not better. Pt given IV magnesium.   Recheck at 07:40 states his headache is getting worse Head CT ordered.  Pt given depacon IV.   07:50 pt turned over to Dr Roderic Palau at change of shift to get results of head CT.  Labs Review Results for orders placed or performed during the hospital encounter of 10/13/15  Carboxyhemoglobin  Result Value Ref Range   Total hemoglobin 12.4 (L) 13.5 - 18.0 g/dL   O2 Saturation 95.5 %   Carboxyhemoglobin 2.0 (H) 0.5 - 1.5 %   Methemoglobin 0.9 0.0 - 1.5 %   Total oxygen content 16.2 15.0 - 23.0 mL/dL   Laboratory interpretation all normal except slightly elevated carboxyhemoglobin but needs to be over 4% in nonsmoker.     MDM   Final diagnoses:  Migraine without  aura and without status migrainosus, not intractable  Nausea and vomiting, vomiting of unspecified type    Disposition pending   Rolland Porter, MD, Barbette Or, MD 10/13/15 [phone]

## 2015-10-13 NOTE — ED Notes (Signed)
Patient with no complaints at this time. Respirations even and unlabored. Skin warm/dry. Discharge instructions reviewed with patient at this time. Patient given opportunity to voice concerns/ask questions. IV removed per policy and band-aid applied to site. Patient discharged at this time and left Emergency Department with steady gait.  

## 2015-10-13 NOTE — ED Notes (Signed)
Pt sleeping soundly at this time.

## 2015-10-13 NOTE — Discharge Instructions (Signed)
Follow up with your family md as needed

## 2015-10-17 ENCOUNTER — Other Ambulatory Visit: Payer: Self-pay | Admitting: Family Medicine

## 2015-11-23 ENCOUNTER — Ambulatory Visit (INDEPENDENT_AMBULATORY_CARE_PROVIDER_SITE_OTHER): Payer: BLUE CROSS/BLUE SHIELD | Admitting: Family Medicine

## 2015-11-23 ENCOUNTER — Encounter: Payer: Self-pay | Admitting: Family Medicine

## 2015-11-23 VITALS — BP 110/72 | HR 86 | Resp 18 | Ht 72.0 in | Wt 235.0 lb

## 2015-11-23 DIAGNOSIS — I10 Essential (primary) hypertension: Secondary | ICD-10-CM | POA: Diagnosis not present

## 2015-11-23 DIAGNOSIS — E785 Hyperlipidemia, unspecified: Secondary | ICD-10-CM | POA: Diagnosis not present

## 2015-11-23 DIAGNOSIS — E669 Obesity, unspecified: Secondary | ICD-10-CM | POA: Diagnosis not present

## 2015-11-23 DIAGNOSIS — G479 Sleep disorder, unspecified: Secondary | ICD-10-CM

## 2015-11-23 DIAGNOSIS — IMO0002 Reserved for concepts with insufficient information to code with codable children: Secondary | ICD-10-CM

## 2015-11-23 DIAGNOSIS — G43709 Chronic migraine without aura, not intractable, without status migrainosus: Secondary | ICD-10-CM

## 2015-11-23 NOTE — Assessment & Plan Note (Signed)
Hyperlipidemia:Low fat diet discussed and encouraged.   Lipid Panel  Lab Results  Component Value Date   CHOL 103* 09/22/2015   HDL 36* 09/22/2015   LDLCALC 54 09/22/2015   TRIG 63 09/22/2015   CHOLHDL 2.9 09/22/2015  Needs to increase exercise to improve HDL

## 2015-11-23 NOTE — Assessment & Plan Note (Signed)
Managed by neurology , controlled currently though has had eD visit since last appt here

## 2015-11-23 NOTE — Patient Instructions (Signed)
F/u in 4.5 month, call if you need me sooner  CONGRATS on excellent blood pressure and improved stress maagement  Please return stool cards for colon cancer screening this week  Please schedule and keep appt with Dr Merlene Laughter for evaluation of snoring  Congrats on weight loss , keep it up!  Non fast chem 7 and EGFr in 4.5 months, 5 days before visit please

## 2015-11-23 NOTE — Assessment & Plan Note (Signed)
Snoring, chronic headaches and fatigue and hypertension , needs eval for sleep apnea, will again refer , he accepts and understands the need to have this done

## 2015-11-23 NOTE — Assessment & Plan Note (Signed)
Controlled, no change in medication DASH diet and commitment to daily physical activity for a minimum of 30 minutes discussed and encouraged, as a part of hypertension management. The importance of attaining a healthy weight is also discussed.  BP/Weight 11/23/2015 10/13/2015 09/25/2015 08/31/2015 08/27/2015 [id number]  Systolic BP A999333 123XX123 [id number] A999333 [id number] [id number]  Diastolic BP 72 99991111 80 85 82 100 82  Wt. (Lbs) 235 245 - 245 - 243 -  BMI 31.86 36.16 - 36.16 - 32.95 -

## 2015-11-23 NOTE — Progress Notes (Signed)
   Subjective:    Patient ID: [name], male    DOB: [birth year], 45 y.o.   MRN: [id number]  [name]     MRN: [id number]      DOB: 1971-01-13   HPI [name] is here for follow up and re-evaluation of chronic medical conditions, medication management and review of any available recent lab and radiology data.  Preventive health is updated, specifically  Cancer screening and Immunization.   Questions or concerns regarding consultations or procedures which the PT has had in the interim are  addressed. The PT denies any adverse reactions to current medications since the last visit.  There are no new concerns.  There are no specific complaints   ROS Denies recent fever or chills. Denies sinus pressure, nasal congestion, ear pain or sore throat. Denies chest congestion, productive cough or wheezing. Denies chest pains, palpitations and leg swelling Denies abdominal pain, nausea, vomiting,diarrhea or constipation.   Denies dysuria, frequency, hesitancy or incontinence. Denies joint pain, swelling and limitation in mobility. Denies headaches, seizures, numbness, or tingling. Denies depression, anxiety or insomnia. Denies skin break down or rash.   PE  BP 110/72 mmHg  Pulse 86  Resp 18  Ht 6' (1.829 m)  Wt 235 lb (106.595 kg)  BMI 31.86 kg/m2  SpO2 97%  Patient alert and oriented and in no cardiopulmonary distress.  HEENT: No facial asymmetry, EOMI,   oropharynx pink and moist.  Neck supple no JVD, no mass.  Chest: Clear to auscultation bilaterally.  CVS: S1, S2 no murmurs, no S3.Regular rate.  ABD: Soft non tender.   Ext: No edema  MS: Adequate ROM spine, shoulders, hips and knees.  Skin: Intact, no ulcerations or rash noted.  Psych: Good eye contact, normal affect. Memory intact not anxious or depressed appearing.  CNS: CN 2-12 intact, power,  normal throughout.no focal deficits noted.   Assessment & Plan   Essential  hypertension Controlled, no change in medication DASH diet and commitment to daily physical activity for a minimum of 30 minutes discussed and encouraged, as a part of hypertension management. The importance of attaining a healthy weight is also discussed.  BP/Weight 11/23/2015 10/13/2015 09/25/2015 08/31/2015 08/27/2015 [id number]  Systolic BP A999333 123XX123 [id number] A999333 123456 0000000 A999333  Diastolic BP 72 99991111 80 85 82 100 82  Wt. (Lbs) 235 245 - 245 - 243 -  BMI 31.86 36.16 - 36.16 - 32.95 -        Obesity (BMI 30.0-34.9) Improved. Pt applauded on succesful weight loss through lifestyle change, and encouraged to continue same. Weight loss goal set for the next several months.   Sleep disorder Snoring, chronic headaches and fatigue and hypertension , needs eval for sleep apnea, will again refer , he accepts and understands the need to have this done  Hyperlipidemia LDL goal <100 Hyperlipidemia:Low fat diet discussed and encouraged.   Lipid Panel  Lab Results  Component Value Date   CHOL 103* 09/22/2015   HDL 36* 09/22/2015   LDLCALC 54 09/22/2015   TRIG 63 09/22/2015   CHOLHDL 2.9 09/22/2015  Needs to increase exercise to improve HDL      Chronic migraine Managed by neurology , controlled currently though has had eD visit since last appt here       Review of Systems     Objective:   Physical Exam        Assessment & Plan:

## 2015-11-23 NOTE — Assessment & Plan Note (Signed)
Improved. Pt applauded on succesful weight loss through lifestyle change, and encouraged to continue same. Weight loss goal set for the next several months.  

## 2015-12-08 ENCOUNTER — Other Ambulatory Visit (HOSPITAL_COMMUNITY): Payer: Self-pay | Admitting: Respiratory Therapy

## 2015-12-08 DIAGNOSIS — G4459 Other complicated headache syndrome: Secondary | ICD-10-CM

## 2015-12-08 DIAGNOSIS — G4733 Obstructive sleep apnea (adult) (pediatric): Secondary | ICD-10-CM

## 2015-12-08 DIAGNOSIS — I10 Essential (primary) hypertension: Secondary | ICD-10-CM

## 2016-01-13 ENCOUNTER — Emergency Department (HOSPITAL_COMMUNITY): Payer: BLUE CROSS/BLUE SHIELD

## 2016-01-13 ENCOUNTER — Emergency Department (HOSPITAL_COMMUNITY)
Admission: EM | Admit: 2016-01-13 | Discharge: 2016-01-13 | Disposition: A | Discharge status: Home / Self Care | Payer: BLUE CROSS/BLUE SHIELD | Attending: Emergency Medicine | Admitting: Emergency Medicine

## 2016-01-13 ENCOUNTER — Encounter (HOSPITAL_COMMUNITY): Payer: Self-pay | Admitting: *Deleted

## 2016-01-13 DIAGNOSIS — W010XXA Fall on same level from slipping, tripping and stumbling without subsequent striking against object, initial encounter: Secondary | ICD-10-CM | POA: Diagnosis not present

## 2016-01-13 DIAGNOSIS — E785 Hyperlipidemia, unspecified: Secondary | ICD-10-CM | POA: Diagnosis not present

## 2016-01-13 DIAGNOSIS — Z87891 Personal history of nicotine dependence: Secondary | ICD-10-CM | POA: Insufficient documentation

## 2016-01-13 DIAGNOSIS — Y939 Activity, unspecified: Secondary | ICD-10-CM | POA: Insufficient documentation

## 2016-01-13 DIAGNOSIS — Y999 Unspecified external cause status: Secondary | ICD-10-CM | POA: Insufficient documentation

## 2016-01-13 DIAGNOSIS — E669 Obesity, unspecified: Secondary | ICD-10-CM | POA: Diagnosis not present

## 2016-01-13 DIAGNOSIS — I10 Essential (primary) hypertension: Secondary | ICD-10-CM | POA: Insufficient documentation

## 2016-01-13 DIAGNOSIS — S93602A Unspecified sprain of left foot, initial encounter: Secondary | ICD-10-CM | POA: Diagnosis not present

## 2016-01-13 DIAGNOSIS — Y929 Unspecified place or not applicable: Secondary | ICD-10-CM | POA: Diagnosis not present

## 2016-01-13 DIAGNOSIS — M79672 Pain in left foot: Secondary | ICD-10-CM | POA: Diagnosis present

## 2016-01-13 DIAGNOSIS — M7989 Other specified soft tissue disorders: Secondary | ICD-10-CM | POA: Diagnosis not present

## 2016-01-13 DIAGNOSIS — S99922A Unspecified injury of left foot, initial encounter: Secondary | ICD-10-CM | POA: Diagnosis not present

## 2016-01-13 DIAGNOSIS — M25572 Pain in left ankle and joints of left foot: Secondary | ICD-10-CM | POA: Diagnosis not present

## 2016-01-13 MED ORDER — OXYCODONE HCL 5 MG PO TABS: 5.0000 mg | ORAL_TABLET | Freq: Four times a day (QID) | ORAL | Status: DC | PRN

## 2016-01-13 NOTE — Discharge Instructions (Signed)
Foot Sprain °A foot sprain is an injury to one of the strong bands of tissue (ligaments) that connect and support the many bones in your feet. The ligament can be stretched too much or it can tear. A tear can be either partial or complete. The severity of the sprain depends on how much of the ligament was damaged or torn. °CAUSES °A foot sprain is usually caused by suddenly twisting or pivoting your foot. °RISK FACTORS °This injury is more likely to occur in people who: °· Play a sport, such as basketball or football. °· Exercise or play a sport without warming up. °· Start a new workout or sport. °· Suddenly increase how long or hard they exercise or play a sport. °SYMPTOMS °Symptoms of this condition start soon after an injury and include: °· Pain, especially in the arch of the foot. °· Bruising. °· Swelling. °· Inability to walk or use the foot to support body weight. °DIAGNOSIS °This condition is diagnosed with a medical history and physical exam. You may also have imaging tests, such as: °· X-rays to make sure there are no broken bones (fractures). °· MRI to see if the ligament has torn. °TREATMENT °Treatment varies depending on the severity of your sprain. Mild sprains can be treated with rest, ice, compression, and elevation (RICE). If your ligament is overstretched or partially torn, treatment usually involves keeping your foot in a fixed position (immobilization) for a period of time. To help you do this, your health care provider will apply a bandage, splint, or walking boot to keep your foot from moving until it heals. You may also be advised to use crutches or a scooter for a few weeks to avoid bearing weight on your foot while it is healing. °If your ligament is fully torn, you may need surgery to reconnect the ligament to the bone. After surgery, a cast or splint will be applied and will need to stay on your foot while it heals. °Your health care provider may also suggest exercises or physical therapy  to strengthen your foot. °HOME CARE INSTRUCTIONS °If You Have a Bandage, Splint, or Walking Boot: °· Wear it as directed by your health care provider. Remove it only as directed by your health care provider. °· Loosen the bandage, splint, or walking boot if your toes become numb and tingle, or if they turn cold and blue. °Bathing °· If your health care provider approves bathing and showering, cover the bandage or splint with a watertight plastic bag to protect it from water. Do not let the bandage or splint get wet. °Managing Pain, Stiffness, and Swelling  °· If directed, apply ice to the injured area: °¨ Put ice in a plastic bag. °¨ Place a towel between your skin and the bag. °¨ Leave the ice on for 20 minutes, 2-3 times per day. °· Move your toes often to avoid stiffness and to lessen swelling. °· Raise (elevate) the injured area above the level of your heart while you are sitting or lying down. °Driving °· Do not drive or operate heavy machinery while taking pain medicine. °· Do not drive while wearing a bandage, splint, or walking boot on a foot that you use for driving. °Activity °· Rest as directed by your health care provider. °· Do not use the injured foot to support your body weight until your health care provider says that you can. Use crutches or other supportive devices as directed by your health care provider. °· Ask your health care   provider what activities are safe for you. Gradually increase how much and how far you walk until your health care provider says it is safe to return to full activity.  Do any exercise or physical therapy as directed by your health care provider. General Instructions  If a splint was applied, do not put pressure on any part of it until it is fully hardened. This may take several hours.  Take medicines only as directed by your health care provider. These include over-the-counter medicines and prescription medicines.  Keep all follow-up visits as directed by your  health care provider. This is important.  When you can walk without pain, wear supportive shoes that have stiff soles. Do not wear flip-flops, and do not walk barefoot. SEEK MEDICAL CARE IF:  Your pain is not controlled with medicine.  Your bruising or swelling gets worse or does not get better with treatment.  Your splint or walking boot is damaged. SEEK IMMEDIATE MEDICAL CARE IF:  Your foot is numb or blue.  Your foot feels colder than normal.   This information is not intended to replace advice given to you by your health care provider. Make sure you discuss any questions you have with your health care provider.   Document Released: 02/25/2002 Document Revised: 01/20/2015 Document Reviewed: 07/09/2014 Elsevier Interactive Patient Education 2016 [place] the ace wrap or the brace if tolerated and use crutches to avoid weight bearing.  Use ice and elevation as much as possible for the next several days to help reduce the swelling.  Take the medications prescribed.  You may take the oxycodone prescribed for pain relief.  This will make you drowsy - do not drive within 4 hours of taking this medication.  Call Dr. Moshe Cipro or the orthopedic doctor listed for a recheck of your injury in 1 week.  You may benefit from physical therapy of your foot is not getting better over the next week.

## 2016-01-13 NOTE — ED Provider Notes (Signed)
CSN: [id number]     Arrival date & time 01/13/16  1020 History   First MD Initiated Contact with Patient 01/13/16 1040     Chief Complaint  Patient presents with  . Foot Pain     (Consider location/radiation/quality/duration/timing/severity/associated sxs/prior Treatment) The history is provided by the patient.   [name] is a 45 y.o. male with past medical history as outlined below presenting with left foot and ankle pain when he slipped on a wet surface last night and fell, twisting the ankle and landing with his foot underneath his body.  He reports persistent pain and swelling along the lateral and also from the foot arch to the mid foot.  He has had no treatments prior to arrival. He has been able to weight bear but with pain.     Past Medical History  Diagnosis Date  . Chronic back pain   . Headache(784.0)   . Essential hypertension, benign   . Duodenitis with bleeding 08/2008    Ulcer - admitted to [facility]   . GERD (gastroesophageal reflux disease)   . Hyperlipidemia   . Obesity, unspecified    Past Surgical History  Procedure Laterality Date  . Foot fracture surgery    . Finger debridement Left 09/25/2013    Thumb  . 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 Diabetes Mother   . Hypertension Mother   . Hypertension Father   . Arthritis Father   . Diabetes Father    Social History  Substance Use Topics  . Smoking status: Former Smoker -- 0.10 packs/day    Types: Cigarettes    Quit date: 05/28/2012  . Smokeless tobacco: Never Used  . Alcohol Use: No    Review of Systems  Musculoskeletal: Positive for joint swelling and arthralgias.  Skin: Negative for wound.  Neurological: Negative for weakness and numbness.      Allergies   Bee venom; Prochlorperazine edisylate; Cyclobenzaprine hcl; Aleve; Carbamazepine; Geodon; Ibuprofen; Maxzide; Nsaids; Tylenol; and Other  Home Medications   Prior to Admission medications   Medication Sig Start Date End Date Taking? Authorizing Provider  amLODipine-olmesartan (AZOR) 10-40 MG tablet Take 1 tablet by mouth every morning. 06/25/15   Fayrene Helper, MD  cloNIDine (CATAPRES) 0.2 MG tablet TAKE ONE TABLET BY MOUTH TWICE DAILY. 10/19/15   Fayrene Helper, MD  doxazosin (CARDURA) 4 MG tablet One tablet at bedtime for high blood pressure 06/25/15   Fayrene Helper, MD  hydrOXYzine (VISTARIL) 50 MG capsule Take 1 capsule by mouth daily. 09/30/15   Historical Provider, MD  nitroGLYCERIN (NITROSTAT) 0.4 MG SL tablet Place 1 tablet (0.4 mg total) under the tongue every 5 (five) minutes as needed for chest pain. 07/15/14   Satira Sark, MD  oxyCODONE (ROXICODONE) 5 MG immediate release tablet Take 1 tablet (5 mg total) by mouth every 6 (six) hours as needed for severe pain. 01/13/16   Evalee Jefferson, PA-C  oxyCODONE-acetaminophen (PERCOCET) 7.5-325 MG tablet  10/30/15   Historical Provider, MD   BP 149/95 mmHg  Pulse 61  Temp(Src) 97.5 F (36.4 C) (Oral)  Resp 16  Ht 5\' 9"  (1.753 m)  Wt 106.595 kg  BMI 34.69 kg/m2  SpO2 100% Physical Exam  Constitutional: He appears well-developed and  well-nourished.  HENT:  Head: Normocephalic.  Cardiovascular: Normal rate and intact distal pulses.  Exam reveals no decreased pulses.   Pulses:      Dorsalis pedis pulses are 2+ on the right side, and 2+ on the left side.       Posterior tibial pulses are 2+ on the right side, and 2+ on the left side.  Musculoskeletal: He exhibits edema and tenderness.       Left ankle: He exhibits swelling. He exhibits no ecchymosis, no deformity, no laceration and normal pulse. Tenderness. Lateral malleolus tenderness found. No head of 5th metatarsal and no proximal fibula tenderness found. Achilles tendon  normal.  ttp left lateral malleolus with swelling radiating to the lateral midfoot.  ttp left arch without edema. Proximal fibula nontender.   Neurological: He is alert. No sensory deficit.  Skin: Skin is warm, dry and intact.  Nursing note and vitals reviewed.   ED Course  Procedures (including critical care time) Labs Review Labs Reviewed - No data to display  Imaging Review Dg Ankle Complete Left  01/13/2016  CLINICAL DATA:  [place] last night, twisting ankle. Lateral pain and swelling. EXAM: LEFT ANKLE COMPLETE - 3+ VIEW COMPARISON:  None. FINDINGS: There is no evidence of fracture, dislocation, or joint effusion. There is no evidence of arthropathy or other focal bone abnormality. Soft tissues are unremarkable. IMPRESSION: Negative. Electronically Signed   By: Rolm Baptise M.D.   On: 01/13/2016 11:13   Dg Foot Complete Left  01/13/2016  CLINICAL DATA:  Pain and swelling of the left foot and ankle after a fall last night. EXAM: LEFT FOOT - COMPLETE 3+ VIEW COMPARISON:  12/29/2006 FINDINGS: There is no evidence of fracture or dislocation. There is no evidence of arthropathy or other focal bone abnormality. Soft tissue swelling around the ankle. IMPRESSION: Soft tissue swelling.  Otherwise normal exam. Electronically Signed   By: Lorriane Shire M.D.   On: 01/13/2016 11:10   I have personally reviewed and evaluated these images and lab results as part of my medical decision-making.   EKG Interpretation None      MDM   Final diagnoses:  Foot sprain, left, initial encounter    Pt was unable to tolerate ASO against skin, ace wrap first followed by ASO,  Crutches,  RICE.  Oxycodone (tylenol allergic, h/o peptic ulcer so nsaid not good option).  Referral to pcp for a recheck in one week.  Also gave referral to ortho as an option if sx persist or worsen.    The patient appears reasonably screened and/or stabilized for discharge and I doubt any other medical condition or other [facility] requiring  further screening, evaluation, or treatment in the ED at this time prior to discharge.     Evalee Jefferson, PA-C 01/13/16 1605  Elnora Morrison, MD 01/16/16 [phone]

## 2016-01-13 NOTE — ED Notes (Signed)
Pt reports slipping last night and "twisted" his ankle. C/o pain in heel and ankle pain.

## 2016-01-20 ENCOUNTER — Emergency Department (HOSPITAL_COMMUNITY)
Admission: EM | Admit: 2016-01-20 | Discharge: 2016-01-20 | Disposition: A | Discharge status: Home / Self Care | Payer: BLUE CROSS/BLUE SHIELD | Attending: Emergency Medicine | Admitting: Emergency Medicine

## 2016-01-20 ENCOUNTER — Encounter (HOSPITAL_COMMUNITY): Payer: Self-pay | Admitting: Emergency Medicine

## 2016-01-20 DIAGNOSIS — Z79899 Other long term (current) drug therapy: Secondary | ICD-10-CM | POA: Diagnosis not present

## 2016-01-20 DIAGNOSIS — I10 Essential (primary) hypertension: Secondary | ICD-10-CM | POA: Insufficient documentation

## 2016-01-20 DIAGNOSIS — E785 Hyperlipidemia, unspecified: Secondary | ICD-10-CM | POA: Diagnosis not present

## 2016-01-20 DIAGNOSIS — Z87891 Personal history of nicotine dependence: Secondary | ICD-10-CM | POA: Insufficient documentation

## 2016-01-20 DIAGNOSIS — R51 Headache: Secondary | ICD-10-CM | POA: Insufficient documentation

## 2016-01-20 DIAGNOSIS — R519 Headache, unspecified: Secondary | ICD-10-CM

## 2016-01-20 DIAGNOSIS — E669 Obesity, unspecified: Secondary | ICD-10-CM | POA: Diagnosis not present

## 2016-01-20 HISTORY — DX: Migraine, unspecified, not intractable, without status migrainosus: G43.909

## 2016-01-20 MED ORDER — DIPHENHYDRAMINE HCL 50 MG/ML IJ SOLN
25.0000 mg | Freq: Once | INTRAMUSCULAR | Status: AC
  Administered 2016-01-20: 25 mg via INTRAVENOUS
  Filled 2016-01-20: qty 1

## 2016-01-20 MED ORDER — METOCLOPRAMIDE HCL 5 MG/ML IJ SOLN
10.0000 mg | Freq: Once | INTRAMUSCULAR | Status: AC
  Administered 2016-01-20: 10 mg via INTRAVENOUS
  Filled 2016-01-20: qty 2

## 2016-01-20 MED ORDER — SODIUM CHLORIDE 0.9 % IV SOLN
INTRAVENOUS | Status: DC
  Administered 2016-01-20: 11:00:00 via INTRAVENOUS

## 2016-01-20 MED ORDER — MORPHINE SULFATE (PF) 4 MG/ML IV SOLN
4.0000 mg | Freq: Once | INTRAVENOUS | Status: AC
  Administered 2016-01-20: 4 mg via INTRAVENOUS
  Filled 2016-01-20: qty 1

## 2016-01-20 NOTE — ED Notes (Addendum)
Patient given water per request. States slow, but positive improvement in headache.

## 2016-01-20 NOTE — Discharge Instructions (Signed)
Follow-up your primary care doctor. °

## 2016-01-20 NOTE — ED Provider Notes (Signed)
CSN: [id number]     Arrival date & time 01/20/16  0946 History   First MD Initiated Contact with Patient 01/20/16 1004     Chief Complaint  Patient presents with  . Headache     (Consider location/radiation/quality/duration/timing/severity/associated sxs/prior Treatment) HPI...Marland KitchenMarland KitchenFrontal headache since this morning with associated photophobia. She gets an occasional headache. This appears to be similar to his normal. No neurological deficits, stiff neck, fever, chills.  He took nothing for his pain at home.  Severity is moderate.  Past Medical History  Diagnosis Date  . Chronic back pain   . Headache(784.0)   . Essential hypertension, benign   . Duodenitis with bleeding 08/2008    Ulcer - admitted to [facility]   . GERD (gastroesophageal reflux disease)   . Hyperlipidemia   . Obesity, unspecified   . Migraines    Past Surgical History  Procedure Laterality Date  . Foot fracture surgery    . Finger debridement Left 09/25/2013    Thumb  . 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 Diabetes Mother   . Hypertension Mother   . Hypertension Father   . Arthritis Father   . Diabetes Father    Social History  Substance Use Topics  . Smoking status: Former Smoker -- 0.10 packs/day    Types: Cigarettes    Quit date: 05/28/2012  . Smokeless tobacco: Never Used  . Alcohol Use: No    Review of Systems  All other systems reviewed and are negative.     Allergies  Bee venom; Prochlorperazine edisylate; Cyclobenzaprine hcl; Aleve; Carbamazepine; Geodon; Ibuprofen; Maxzide; Nsaids; Tylenol; and Other  Home Medications   Prior to Admission medications   Medication Sig Start Date End Date Taking? Authorizing Provider  amLODipine-olmesartan (AZOR) 10-40 MG tablet  Take 1 tablet by mouth every morning. 06/25/15  Yes Fayrene Helper, MD  cloNIDine (CATAPRES) 0.2 MG tablet TAKE ONE TABLET BY MOUTH TWICE DAILY. 10/19/15  Yes Fayrene Helper, MD  doxazosin (CARDURA) 4 MG tablet One tablet at bedtime for high blood pressure 06/25/15  Yes Fayrene Helper, MD  hydrOXYzine (VISTARIL) 50 MG capsule Take 1 capsule by mouth daily. 09/30/15  Yes Historical Provider, MD  nitroGLYCERIN (NITROSTAT) 0.4 MG SL tablet Place 1 tablet (0.4 mg total) under the tongue every 5 (five) minutes as needed for chest pain. 07/15/14  Yes Satira Sark, MD  oxyCODONE (ROXICODONE) 5 MG immediate release tablet Take 1 tablet (5 mg total) by mouth every 6 (six) hours as needed for severe pain. 01/13/16  Yes Evalee Jefferson, PA-C  oxyCODONE-acetaminophen (PERCOCET) 7.5-325 MG tablet Take 1 tablet by mouth every 6 (six) hours as needed for moderate pain.  10/30/15  Yes Historical Provider, MD   BP 148/101 mmHg  Pulse 53  Temp(Src) 98.4 F (36.9 C) (Oral)  Resp 16  Ht 5\' 9"  (1.753 m)  Wt 230 lb (104.327 kg)  BMI 33.95 kg/m2  SpO2 100% Physical Exam  Constitutional: He is oriented to person, place, and time. He appears well-developed and well-nourished.  photophobic  HENT:  Head: Normocephalic and atraumatic.  Eyes: Conjunctivae and EOM are normal. Pupils are equal, round, and reactive to light.  Neck: Normal range of motion. Neck supple.  Cardiovascular: Normal rate and regular rhythm.  Pulmonary/Chest: Effort normal and breath sounds normal.  Abdominal: Soft. Bowel sounds are normal.  Musculoskeletal: Normal range of motion.  Neurological: He is alert and oriented to person, place, and time.  Skin: Skin is warm and dry.  Psychiatric: He has a normal mood and affect. His behavior is normal.  Nursing note and vitals reviewed.   ED Course  Procedures (including critical care time) Labs Review Labs Reviewed - No data to display  Imaging Review No results found. I have  personally reviewed and evaluated these images and lab results as part of my medical decision-making.   EKG Interpretation None      MDM   Final diagnoses:  Acute intractable headache, unspecified headache type    Pt feels better after IV fluids and pain management. No neurological deficits.    Nat Christen, MD 01/20/16 [phone]

## 2016-01-20 NOTE — ED Notes (Signed)
Pt reports woke up with headache,n,bilateral "ringing in ears" this am. Pt reports light and sound sensitivity. Pt reports history of migraines. nad noted.

## 2016-02-01 DIAGNOSIS — G4733 Obstructive sleep apnea (adult) (pediatric): Secondary | ICD-10-CM | POA: Diagnosis not present

## 2016-02-01 DIAGNOSIS — F419 Anxiety disorder, unspecified: Secondary | ICD-10-CM | POA: Diagnosis not present

## 2016-02-01 DIAGNOSIS — Z79899 Other long term (current) drug therapy: Secondary | ICD-10-CM | POA: Diagnosis not present

## 2016-02-01 DIAGNOSIS — G4459 Other complicated headache syndrome: Secondary | ICD-10-CM | POA: Diagnosis not present

## 2016-02-04 ENCOUNTER — Ambulatory Visit: Payer: BLUE CROSS/BLUE SHIELD | Attending: Neurology | Admitting: Neurology

## 2016-02-04 DIAGNOSIS — Z79899 Other long term (current) drug therapy: Secondary | ICD-10-CM | POA: Insufficient documentation

## 2016-02-04 DIAGNOSIS — G4459 Other complicated headache syndrome: Secondary | ICD-10-CM | POA: Diagnosis not present

## 2016-02-04 DIAGNOSIS — I10 Essential (primary) hypertension: Secondary | ICD-10-CM

## 2016-02-04 DIAGNOSIS — G894 Chronic pain syndrome: Secondary | ICD-10-CM | POA: Diagnosis not present

## 2016-02-04 DIAGNOSIS — G4733 Obstructive sleep apnea (adult) (pediatric): Secondary | ICD-10-CM | POA: Diagnosis not present

## 2016-02-07 NOTE — Procedures (Signed)
  [place] A. Merlene Laughter, MD     [email or web address]             NOCTURNAL POLYSOMNOGRAPHY   LOCATION: [facility]  Patient Name: [name], [name] Date: 02/04/2016 Gender: Male D.O.B: 1971/06/09 Age (years): 40 Referring Provider: Not Available Height (inches): 69 Interpreting Physician: Phillips Odor MD, ABSM Weight (lbs): 240 RPSGT: Peak, Robert BMI: 35 MRN: [id number] Neck Size: 18.00 CLINICAL INFORMATION Sleep Study Type: NPSG Indication for sleep study: Hypertension Epworth Sleepiness Score: 6 SLEEP STUDY TECHNIQUE As per the AASM Manual for the Scoring of Sleep and Associated Events v2.3 (April 2016) with a hypopnea requiring 4% desaturations. The channels recorded and monitored were frontal, central and occipital EEG, electrooculogram (EOG), submentalis EMG (chin), nasal and oral airflow, thoracic and abdominal wall motion, anterior tibialis EMG, snore microphone, electrocardiogram, and pulse oximetry. MEDICATIONS Patient's medications include: N/A. Medications self-administered by patient during sleep study : No sleep medicine administered.  Current outpatient prescriptions:  .  amLODipine-olmesartan (AZOR) 10-40 MG tablet, Take 1 tablet by mouth every morning., Disp: 90 tablet, Rfl: 1 .  cloNIDine (CATAPRES) 0.2 MG tablet, TAKE ONE TABLET BY MOUTH TWICE DAILY., Disp: 180 tablet, Rfl: 0 .  doxazosin (CARDURA) 4 MG tablet, One tablet at bedtime for high blood pressure, Disp: 90 tablet, Rfl: 1 .  hydrOXYzine (VISTARIL) 50 MG capsule, Take 1 capsule by mouth daily., Disp: , Rfl:  .  nitroGLYCERIN (NITROSTAT) 0.4 MG SL tablet, Place 1 tablet (0.4 mg total) under the tongue every 5 (five) minutes as needed for chest pain., Disp: 25 tablet, Rfl: 1 .  oxyCODONE (ROXICODONE) 5 MG immediate release tablet, Take 1 tablet (5 mg total) by mouth every 6 (six) hours as needed for severe pain., Disp: 20 tablet, Rfl: 0 .  oxyCODONE-acetaminophen (PERCOCET) 7.5-325  MG tablet, Take 1 tablet by mouth every 6 (six) hours as needed for moderate pain. , Disp: , Rfl:   SLEEP ARCHITECTURE The study was initiated at 10:27:50 PM and ended at 5:05:52 AM. Sleep onset time was 12.0 minutes and the sleep efficiency was 79.4%. The total sleep time was 316.0 minutes. Stage REM latency was 169.0 minutes. The patient spent 10.60% of the night in stage N1 sleep, 78.16% in stage N2 sleep, 0.32% in stage N3 and 10.92% in REM. Alpha intrusion was absent. Supine sleep was 61.08%. RESPIRATORY PARAMETERS The overall apnea/hypopnea index (AHI) was 25.3 per hour. There were 65 total apneas, including 19 obstructive, 38 central and 8 mixed apneas. There were 68 hypopneas and 43 RERAs. The AHI during Stage REM sleep was 15.7 per hour. AHI while supine was 25.5 per hour. The mean oxygen saturation was 94.38%. The minimum SpO2 during sleep was 85.00%. Loud snoring was noted during this study. CARDIAC DATA The 2 lead EKG demonstrated sinus rhythm. The mean heart rate was 49.97 beats per minute. Other EKG findings include: None. LEG MOVEMENT DATA The total PLMS were 0 with a resulting PLMS index of 0.00. Associated arousal with leg movement index was 0.0.    IMPRESSIONS - Moderate obstructive sleep apnea. Suggest AutoPAP 6-12.  - Markedly reduced slow wave sleep.   Delano Metz, MD Diplomate, American Board of Sleep Medicine.

## 2016-02-11 ENCOUNTER — Other Ambulatory Visit: Payer: Self-pay

## 2016-02-11 MED ORDER — CLONIDINE HCL 0.2 MG PO TABS: 0.2000 mg | ORAL_TABLET | Freq: Two times a day (BID) | ORAL | Status: DC

## 2016-02-23 ENCOUNTER — Other Ambulatory Visit: Payer: Self-pay

## 2016-02-23 DIAGNOSIS — I10 Essential (primary) hypertension: Secondary | ICD-10-CM

## 2016-02-23 MED ORDER — AMLODIPINE-OLMESARTAN 10-40 MG PO TABS: 1.0000 | ORAL_TABLET | Freq: Every morning | ORAL | Status: DC

## 2016-02-23 MED ORDER — DOXAZOSIN MESYLATE 4 MG PO TABS: ORAL_TABLET | ORAL | Status: DC

## 2016-03-15 DIAGNOSIS — G4733 Obstructive sleep apnea (adult) (pediatric): Secondary | ICD-10-CM | POA: Diagnosis not present

## 2016-03-15 DIAGNOSIS — G894 Chronic pain syndrome: Secondary | ICD-10-CM | POA: Diagnosis not present

## 2016-03-15 DIAGNOSIS — G4459 Other complicated headache syndrome: Secondary | ICD-10-CM | POA: Diagnosis not present

## 2016-03-15 DIAGNOSIS — Z79899 Other long term (current) drug therapy: Secondary | ICD-10-CM | POA: Diagnosis not present

## 2016-05-14 ENCOUNTER — Other Ambulatory Visit: Payer: Self-pay | Admitting: Family Medicine

## 2016-05-14 DIAGNOSIS — I10 Essential (primary) hypertension: Secondary | ICD-10-CM

## 2016-05-15 ENCOUNTER — Other Ambulatory Visit: Payer: Self-pay

## 2016-05-15 MED ORDER — CLONIDINE HCL 0.2 MG PO TABS: 0.2000 mg | ORAL_TABLET | Freq: Two times a day (BID) | ORAL | 0 refills | Status: DC

## 2016-05-16 ENCOUNTER — Ambulatory Visit (INDEPENDENT_AMBULATORY_CARE_PROVIDER_SITE_OTHER): Payer: BLUE CROSS/BLUE SHIELD | Admitting: Family Medicine

## 2016-05-16 ENCOUNTER — Encounter: Payer: Self-pay | Admitting: Family Medicine

## 2016-05-16 ENCOUNTER — Encounter (INDEPENDENT_AMBULATORY_CARE_PROVIDER_SITE_OTHER): Payer: Self-pay

## 2016-05-16 VITALS — BP 138/98 | HR 79 | Resp 16 | Ht 69.0 in | Wt 246.0 lb

## 2016-05-16 DIAGNOSIS — N183 Chronic kidney disease, stage 3 unspecified: Secondary | ICD-10-CM

## 2016-05-16 DIAGNOSIS — E785 Hyperlipidemia, unspecified: Secondary | ICD-10-CM | POA: Diagnosis not present

## 2016-05-16 DIAGNOSIS — Z0000 Encounter for general adult medical examination without abnormal findings: Secondary | ICD-10-CM

## 2016-05-16 DIAGNOSIS — I10 Essential (primary) hypertension: Secondary | ICD-10-CM

## 2016-05-16 DIAGNOSIS — Z09 Encounter for follow-up examination after completed treatment for conditions other than malignant neoplasm: Secondary | ICD-10-CM | POA: Insufficient documentation

## 2016-05-16 DIAGNOSIS — Z125 Encounter for screening for malignant neoplasm of prostate: Secondary | ICD-10-CM

## 2016-05-16 LAB — BASIC METABOLIC PANEL WITH GFR
BUN: 19 mg/dL (ref 7–25)
CALCIUM: 9.1 mg/dL (ref 8.6–10.3)
CO2: 29 mmol/L (ref 20–31)
CREATININE: 2.32 mg/dL — AB (ref 0.60–1.35)
Chloride: 108 mmol/L (ref 98–110)
GFR, Est African American: 37 mL/min — ABNORMAL LOW (ref >=60)
GFR, Est Non African American: 32 mL/min — ABNORMAL LOW (ref >=60)
Glucose, Bld: 94 mg/dL (ref 65–99)
Potassium: 4.6 mmol/L (ref 3.5–5.3)
SODIUM: 140 mmol/L (ref 135–146)

## 2016-05-16 MED ORDER — CLONIDINE HCL 0.2 MG PO TABS: ORAL_TABLET | ORAL | 3 refills | Status: DC

## 2016-05-16 NOTE — Assessment & Plan Note (Signed)
Uncontrolled, inc pm dose of clonidine. Nurse bP check in 6 weeks DASH diet and commitment to daily physical activity for a minimum of 30 minutes discussed and encouraged, as a part of hypertension management. The importance of attaining a healthy weight is also discussed.  BP/Weight 05/16/2016 02/04/2016 01/20/2016 01/13/2016 11/23/2015 [id number]  Systolic BP 0000000 - [id number]  Diastolic BP 98 - [id number] 95 72 101 80  Wt. (Lbs) 246 240 230 235 235 245 -  BMI 36.33 35.43 33.95 34.69 31.86 36.16 -

## 2016-05-16 NOTE — Assessment & Plan Note (Signed)
Deteriorated , needs to control BP and carry out healthy lifestyled

## 2016-05-16 NOTE — Patient Instructions (Addendum)
Nurse BP check in 6 weeks  MD follow up Jan 8 or shortly after  PLEASE return [place], needed for colon screening  bP is high, increase evening clonidine to one and a half tablets, continue one tablet in the morning   Labs today  Fasting labs Jan 4 or after  Please work on good  health habits so that your health will improve. 1. Commitment to daily physical activity for 30 to 60  minutes, if you are able to do this.  2. Commitment to wise food choices. Aim for half of your  food intake to be vegetable and fruit, one quarter starchy foods, and one quarter protein. Try to eat on a regular schedule  3 meals per day, snacking between meals should be limited to vegetables or fruits or small portions of nuts. 64 ounces of water per day is generally recommended, unless you have specific health conditions, like heart failure or kidney failure where you will need to limit fluid intake.  3. Commitment to sufficient and a  good quality of physical and mental rest daily, generally between 6 to 8 hours per day.  WITH PERSISTANCE AND PERSEVERANCE, THE IMPOSSIBLE , BECOMES THE NORM!

## 2016-05-16 NOTE — Assessment & Plan Note (Signed)
Limited as pt refuses rectal exam He is to return stool cards within the next 1 week Denies abdominal pain, change in stool caliber, no f/h colon cancer Counseled re need to commit to change in diet and commitment to daily exercise Flu vaccine refused , re educatred

## 2016-05-16 NOTE — Progress Notes (Signed)
   [name]     MRN: [id number]      DOB: 16-Dec-1970   HPI: Patient is in for annual physical exam.Refuses rectal exam C/o increased and uncontrolled headaches accompanied by ringing in ears, has appt with neurologist next week  Immunization is reviewed , refuses influenza vaccine, but will consider it later   PE; Pleasant male, alert and oriented x 3, in no cardio-pulmonary distress. Afebrile. HEENT No facial trauma or asymetry. Sinuses non tender. EOMI, . External ears normal, tympanic membranes clear. Oropharynx moist, no exudate. Neck: supple, no adenopathy,JVD or thyromegaly.No bruits.  Chest: Clear to ascultation bilaterally.No crackles or wheezes. Non tender to palpation  Cardiovascular system; Heart sounds normal,  S1 and  S2 ,no S3.  No murmur, or thrill. Apical beat not displaced Peripheral pulses normal.  Abdomen: Soft, non tender, no organomegaly or masses. No bruits. Bowel sounds normal. No guarding, tenderness or rebound.  Rectal:   Refused    Musculoskeletal exam: Full ROM of spine, hips , shoulders and knees. No deformity ,swelling or crepitus noted. No muscle wasting or atrophy.   Neurologic: Cranial nerves 2 to 12 intact. Power, tone ,sensation and reflexes normal throughout. No disturbance in gait. No tremor.  Skin: Intact, no ulceration, erythema , scaling or rash noted. Pigmentation normal throughout  Psych; Normal mood and affect. Judgement and concentration normal   Assessment & Plan:  Annual physical exam Limited as pt refuses rectal exam He is to return stool cards within the next 1 week Denies abdominal pain, change in stool caliber, no f/h colon cancer Counseled re need to commit to change in diet and commitment to daily exercise Flu vaccine refused , re educatred  Essential hypertension Uncontrolled, inc pm dose of clonidine. Nurse bP check in 6 weeks DASH diet and commitment to daily physical activity for a  minimum of 30 minutes discussed and encouraged, as a part of hypertension management. The importance of attaining a healthy weight is also discussed.  BP/Weight 05/16/2016 02/04/2016 01/20/2016 01/13/2016 11/23/2015 [id number]  Systolic BP 0000000 - [id number] [id number]  Diastolic BP 98 - [id number] 95 72 101 80  Wt. (Lbs) 246 240 230 235 235 245 -  BMI 36.33 35.43 33.95 34.69 31.86 36.16 -       CKD (chronic kidney disease) stage 3, GFR 30-59 ml/min Deteriorated , needs to control BP and carry out healthy lifestyled

## 2016-05-17 NOTE — Telephone Encounter (Signed)
-----   Message from Fayrene Helper, MD sent at 05/16/2016  8:33 PM EDT ----- pls advise kidney function has deteriorated. Needs to take BP meds regularly AS PRESCRIBED, KEEP F/U APPT FOR nurse  RECHECK IN 6 WEEKS nEEDS REPT CHEM 7 AND egR NON FAST the day he returns for BP check, ensure he drinks 64 ounces water daily

## 2016-05-25 DIAGNOSIS — Z79899 Other long term (current) drug therapy: Secondary | ICD-10-CM | POA: Diagnosis not present

## 2016-05-25 DIAGNOSIS — G4733 Obstructive sleep apnea (adult) (pediatric): Secondary | ICD-10-CM | POA: Diagnosis not present

## 2016-05-25 DIAGNOSIS — F419 Anxiety disorder, unspecified: Secondary | ICD-10-CM | POA: Diagnosis not present

## 2016-05-25 DIAGNOSIS — G43709 Chronic migraine without aura, not intractable, without status migrainosus: Secondary | ICD-10-CM | POA: Diagnosis not present

## 2016-06-24 ENCOUNTER — Telehealth: Payer: Self-pay

## 2016-06-24 NOTE — Telephone Encounter (Signed)
Error

## 2016-07-21 DIAGNOSIS — G4459 Other complicated headache syndrome: Secondary | ICD-10-CM | POA: Diagnosis not present

## 2016-07-21 DIAGNOSIS — Z79891 Long term (current) use of opiate analgesic: Secondary | ICD-10-CM | POA: Diagnosis not present

## 2016-07-21 DIAGNOSIS — F419 Anxiety disorder, unspecified: Secondary | ICD-10-CM | POA: Diagnosis not present

## 2016-07-21 DIAGNOSIS — G894 Chronic pain syndrome: Secondary | ICD-10-CM | POA: Diagnosis not present

## 2016-08-14 ENCOUNTER — Other Ambulatory Visit: Payer: Self-pay | Admitting: Family Medicine

## 2016-08-17 ENCOUNTER — Ambulatory Visit (INDEPENDENT_AMBULATORY_CARE_PROVIDER_SITE_OTHER): Payer: BLUE CROSS/BLUE SHIELD | Admitting: Family Medicine

## 2016-08-17 ENCOUNTER — Encounter: Payer: Self-pay | Admitting: Family Medicine

## 2016-08-17 VITALS — BP 158/100 | HR 84 | Temp 97.8°F | Resp 16 | Ht 69.0 in | Wt 246.1 lb

## 2016-08-17 DIAGNOSIS — Z125 Encounter for screening for malignant neoplasm of prostate: Secondary | ICD-10-CM | POA: Diagnosis not present

## 2016-08-17 DIAGNOSIS — E669 Obesity, unspecified: Secondary | ICD-10-CM

## 2016-08-17 DIAGNOSIS — N183 Chronic kidney disease, stage 3 unspecified: Secondary | ICD-10-CM

## 2016-08-17 DIAGNOSIS — E785 Hyperlipidemia, unspecified: Secondary | ICD-10-CM | POA: Diagnosis not present

## 2016-08-17 DIAGNOSIS — E66811 Obesity, class 1: Secondary | ICD-10-CM

## 2016-08-17 DIAGNOSIS — G43709 Chronic migraine without aura, not intractable, without status migrainosus: Secondary | ICD-10-CM | POA: Diagnosis not present

## 2016-08-17 DIAGNOSIS — I10 Essential (primary) hypertension: Secondary | ICD-10-CM

## 2016-08-17 DIAGNOSIS — IMO0002 Reserved for concepts with insufficient information to code with codable children: Secondary | ICD-10-CM

## 2016-08-17 MED ORDER — ONDANSETRON HCL 4 MG PO TABS: 4.0000 mg | ORAL_TABLET | Freq: Three times a day (TID) | ORAL | 1 refills | Status: DC | PRN

## 2016-08-17 MED ORDER — CLONIDINE HCL 0.3 MG PO TABS: 0.3000 mg | ORAL_TABLET | Freq: Two times a day (BID) | ORAL | 3 refills | Status: DC

## 2016-08-17 MED ORDER — HYDRALAZINE HCL 50 MG PO TABS: ORAL_TABLET | ORAL | 3 refills | Status: DC

## 2016-08-17 MED ORDER — HYDROXYZINE PAMOATE 50 MG PO CAPS: 50.0000 mg | ORAL_CAPSULE | Freq: Every day | ORAL | 5 refills | Status: DC

## 2016-08-17 NOTE — Patient Instructions (Signed)
Nurse BP check in 4 weeks, call if you need me before.  MD follow up in mid Feb  Fasting labs end January,  Please work on weight loss to improve BP  New additional medication hydrallazine twice daily  Higher dose of cloniodine to 0.3mg  one twice daily Continue azor one daily   Take medication every day at the same time please  New for nausea is zofran for use wih migraine headache  You report being sent home yesterday due to headache which you still have as wekll as the fact that bP is too high, work excuse from yeterday to return this Saturday Aug 20, 2016  Thank you  for choosing [facility]. We consider it a privelige to serve you.  Delivering excellent health care in a caring and  compassionate way is our goal.  Partnering with you,  so that together we can achieve this goal is our strategy.   Please work on good  health habits so that your health will improve. 1. Commitment to daily physical activity for 30 to 60  minutes, if you are able to do this.  2. Commitment to wise food choices. Aim for half of your  food intake to be vegetable and fruit, one quarter starchy foods, and one quarter protein. Try to eat on a regular schedule  3 meals per day, snacking between meals should be limited to vegetables or fruits or small portions of nuts. 64 ounces of water per day is generally recommended, unless you have specific health conditions, like heart failure or kidney failure where you will need to limit fluid intake.  3. Commitment to sufficient and a  good quality of physical and mental rest daily, generally between 6 to 8 hours per day.  WITH PERSISTANCE AND PERSEVERANCE, THE IMPOSSIBLE , BECOMES THE NORM!

## 2016-08-17 NOTE — Assessment & Plan Note (Signed)
Uncontrolled, add hydralazine DASH diet and commitment to daily physical activity for a minimum of 30 minutes discussed and encouraged, as a part of hypertension management. The importance of attaining a healthy weight is also discussed.  BP/Weight 08/17/2016 05/16/2016 02/04/2016 01/20/2016 01/13/2016 11/23/2015 3/83/8184  Systolic BP 037 543 - 606 770 340 352  Diastolic BP 481 98 - 859 95 72 101  Wt. (Lbs) 246.08 246 240 230 235 235 245  BMI 36.34 36.33 35.43 33.95 34.69 31.86 36.16

## 2016-08-21 NOTE — Assessment & Plan Note (Signed)
Uncontrolled and associated with severe nausea, reports being sent off the job due to nausea and emesis with severe migraine, additional days from work to get BP controlled Script for zofran written

## 2016-08-21 NOTE — Assessment & Plan Note (Signed)
Hyperlipidemia:Low fat diet discussed and encouraged.   Lipid Panel  Lab Results  Component Value Date   CHOL 103 (L) 09/22/2015   HDL 36 (L) 09/22/2015   LDLCALC 54 09/22/2015   TRIG 63 09/22/2015   CHOLHDL 2.9 09/22/2015  needs to commit to increased exercise to improve HDL. Updated lab needed at/ before next visit.

## 2016-08-21 NOTE — Assessment & Plan Note (Signed)
Importance of BP control and avoidance of nephrotoxic drugs is discussed again and stressed

## 2016-08-21 NOTE — Assessment & Plan Note (Signed)
Deteriorated. Patient re-educated about  the importance of commitment to a  minimum of 150 minutes of exercise per week.  The importance of healthy food choices with portion control discussed. Encouraged to start a food diary, count calories and to consider  joining a support group. Sample diet sheets offered. Goals set by the patient for the next several months.   Weight /BMI 08/17/2016 05/16/2016 02/04/2016  WEIGHT 246 lb 1.3 oz 246 lb 240 lb  HEIGHT 5\' 9"  5\' 9"  5\' 9"   BMI 36.34 kg/m2 36.33 kg/m2 35.43 kg/m2

## 2016-08-21 NOTE — Progress Notes (Signed)
[name]     MRN: [id number]      DOB: June 23, 1971   HPI [name] is here for follow up and re-evaluation of chronic medical conditions, medication management and review of any available recent lab and radiology data.  Preventive health is updated, specifically  Cancer screening and Immunization.   Questions or concerns regarding consultations or procedures which the PT has had in the interim are  addressed. The PT states medication from nephrology is of no benefit for nausea,requests  Different med Sent home reportedly due to uncontrolled nausea and migraine, BP at visit is elevated and has been this way in past 6 to 8 months ROS Denies recent fever or chills. Denies sinus pressure, nasal congestion, ear pain or sore throat. Denies chest congestion, productive cough or wheezing. Denies chest pains, palpitations and leg swelling Denies abdominal pain, nausea, vomiting,diarrhea or constipation.   Denies dysuria, frequency, hesitancy or incontinence. Denies joint pain, swelling and limitation in mobility.  Denies depression, anxiety or insomnia. Denies skin break down or rash.   PE  BP (!) 158/100 (BP Location: Left Arm, Patient Position: Sitting, Cuff Size: Large)   Pulse 84   Temp 97.8 F (36.6 C) (Oral)   Resp 16   Ht 5\' 9"  (1.753 m)   Wt 246 lb 1.3 oz (111.6 kg)   SpO2 99%   BMI 36.34 kg/m   Patient alert and oriented and in mild cardiopulmonary distress.  HEENT: No facial asymmetry, EOMI,   oropharynx pink and moist.  Neck supple no JVD, no mass.  Chest: Clear to auscultation bilaterally.  CVS: S1, S2 no murmurs, no S3.Regular rate.  ABD: Soft non tender.   Ext: No edema  MS: Adequate ROM spine, shoulders, hips and knees.  Skin: Intact, no ulcerations or rash noted.  Psych: Good eye contact, normal affect. Memory intact not anxious or depressed appearing.  CNS: CN 2-12 intact, power,  normal throughout.no focal deficits noted.   Assessment &  Plan  Essential hypertension Uncontrolled, add hydralazine DASH diet and commitment to daily physical activity for a minimum of 30 minutes discussed and encouraged, as a part of hypertension management. The importance of attaining a healthy weight is also discussed.  BP/Weight 08/17/2016 05/16/2016 02/04/2016 01/20/2016 01/13/2016 11/23/2015 6/96/7893  Systolic BP 810 175 - 102 585 277 824  Diastolic BP 235 98 - 361 95 72 101  Wt. (Lbs) 246.08 246 240 230 235 235 245  BMI 36.34 36.33 35.43 33.95 34.69 31.86 36.16       Chronic migraine Uncontrolled and associated with severe nausea, reports being sent off the job due to nausea and emesis with severe migraine, additional days from work to get BP controlled Script for zofran written  Hyperlipidemia LDL goal <100 Hyperlipidemia:Low fat diet discussed and encouraged.   Lipid Panel  Lab Results  Component Value Date   CHOL 103 (L) 09/22/2015   HDL 36 (L) 09/22/2015   LDLCALC 54 09/22/2015   TRIG 63 09/22/2015   CHOLHDL 2.9 09/22/2015  needs to commit to increased exercise to improve HDL. Updated lab needed at/ before next visit.     Obesity (BMI 30.0-34.9) Deteriorated. Patient re-educated about  the importance of commitment to a  minimum of 150 minutes of exercise per week.  The importance of healthy food choices with portion control discussed. Encouraged to start a food diary, count calories and to consider  joining a support group. Sample diet sheets offered. Goals set by the patient for  the next several months.   Weight /BMI 08/17/2016 05/16/2016 02/04/2016  WEIGHT 246 lb 1.3 oz 246 lb 240 lb  HEIGHT 5\' 9"  5\' 9"  5\' 9"   BMI 36.34 kg/m2 36.33 kg/m2 35.43 kg/m2      CKD (chronic kidney disease) stage 3, GFR 30-59 ml/min Importance of BP control and avoidance of nephrotoxic drugs is discussed again and stressed

## 2016-08-29 ENCOUNTER — Other Ambulatory Visit: Payer: Self-pay | Admitting: Family Medicine

## 2016-08-29 ENCOUNTER — Telehealth: Payer: Self-pay

## 2016-08-29 NOTE — Telephone Encounter (Signed)
Patient is confused on what blood pressure medications he is to be taking.  States that he has not taken Azor in 1 week.  Doesn't think that he can take Clonidine at work along with Hydralazine.  Given appt for 12-12 @ 1:30

## 2016-08-29 NOTE — Telephone Encounter (Signed)
Changes have been made historicaly to doses of clonidine and hydrallazine. pls review ,then review with pt and pharmacy and send new directions Remind him of importance in keeping f/u appts pls

## 2016-08-29 NOTE — Telephone Encounter (Signed)
Needs to bRING the medications he is actually taking PLS let him understand, if he does not have them wasted visit

## 2016-08-29 NOTE — Telephone Encounter (Signed)
Patient aware and will bring bottles

## 2016-08-30 ENCOUNTER — Other Ambulatory Visit: Payer: Self-pay

## 2016-08-30 ENCOUNTER — Encounter: Payer: Self-pay | Admitting: Family Medicine

## 2016-08-30 ENCOUNTER — Ambulatory Visit (INDEPENDENT_AMBULATORY_CARE_PROVIDER_SITE_OTHER): Payer: BLUE CROSS/BLUE SHIELD | Admitting: Family Medicine

## 2016-08-30 VITALS — BP 160/104 | HR 70 | Resp 16 | Ht 69.0 in | Wt 251.0 lb

## 2016-08-30 DIAGNOSIS — R9431 Abnormal electrocardiogram [ECG] [EKG]: Secondary | ICD-10-CM

## 2016-08-30 DIAGNOSIS — R5382 Chronic fatigue, unspecified: Secondary | ICD-10-CM | POA: Diagnosis not present

## 2016-08-30 DIAGNOSIS — G479 Sleep disorder, unspecified: Secondary | ICD-10-CM

## 2016-08-30 DIAGNOSIS — IMO0002 Reserved for concepts with insufficient information to code with codable children: Secondary | ICD-10-CM

## 2016-08-30 DIAGNOSIS — I10 Essential (primary) hypertension: Secondary | ICD-10-CM | POA: Diagnosis not present

## 2016-08-30 DIAGNOSIS — G43709 Chronic migraine without aura, not intractable, without status migrainosus: Secondary | ICD-10-CM

## 2016-08-30 DIAGNOSIS — E66811 Obesity, class 1: Secondary | ICD-10-CM

## 2016-08-30 DIAGNOSIS — E669 Obesity, unspecified: Secondary | ICD-10-CM

## 2016-08-30 HISTORY — DX: Abnormal electrocardiogram (ECG) (EKG): R94.31

## 2016-08-30 MED ORDER — AMLODIPINE BESYLATE 10 MG PO TABS: 10.0000 mg | ORAL_TABLET | Freq: Every day | ORAL | 3 refills | Status: DC

## 2016-08-30 NOTE — Progress Notes (Signed)
   [name]     MRN: [id number]      DOB: July 05, 1971   HPI [name] is here c/o headache, dizziness, weakness, states he has not been to work since 12/07, called in saying he was unable to tolerate clonidine dose prescribed and had reverted to breaking morning tablet in half Also unfortunately has  NOT been taking the blood pressure medications as prescribed, misssing azor C/o fatigue , left arm pain Has sleep apnea, still has not got cPAP machine but intends to, Stressed and overwhelmed, "feels badly' especially about his health not being as it should C/o chronic headaches  Denies recent fever or chills. Denies sinus pressure, nasal congestion, ear pain or sore throat. Denies chest congestion, productive cough or wheezing.  Denies abdominal pain, nausea, vomiting,diarrhea or constipation.   Denies dysuria, frequency, hesitancy or incontinence. Denies joint pain, swelling and limitation in mobility.  Denies skin break down or rash.   PE  BP (!) 160/104   Pulse 70   Resp 16   Ht 5\' 9"  (1.753 m)   Wt 251 lb (113.9 kg)   SpO2 97%   BMI 37.07 kg/m   Patient alert and oriented and in no cardiopulmonary distress.  HEENT: No facial asymmetry, EOMI,   oropharynx pink and moist.  Neck supple no JVD, no mass.  Chest: Clear to auscultation bilaterally.  CVS: S1, S2 no murmurs, no S3.Regular rate.  ABD: Soft non tender.   Ext: No edema  MS: Adequate ROM spine, shoulders, hips and knees.  Skin: Intact, no ulcerations or rash noted.  Psych: Good eye contact, normal affect. Memory intact not anxious or depressed appearing.  CNS: CN 2-12 intact, power,  normal throughout.no focal deficits noted.   Assessment & Plan  Essential hypertension Uncontrolled , add amlodipine DASH diet and commitment to daily physical activity for a minimum of 30 minutes discussed and encouraged, as a part of hypertension management. The importance of attaining a healthy weight is also  discussed.  BP/Weight 08/30/2016 08/17/2016 05/16/2016 02/04/2016 01/20/2016 7/61/6073 03/19/625  Systolic BP 948 546 270 - 350 093 818  Diastolic BP 299 371 98 - 696 95 72  Wt. (Lbs) 251 246.08 246 240 230 235 235  BMI 37.07 36.34 36.33 35.43 33.95 34.69 31.86      work excuse to return 09/05/2016  Nonspecific abnormal electrocardiogram (ECG) (EKG) Fatigue, uncontrolled hypertension, non specific t wavbe abnormalities, lAD and sinus bradycardia, abn eKG, needs card eval  Chronic migraine Uncontrolled , managed by neurology, currently under significant psychologic stress and has uncontrolled hypertension. Reports uncontrolled symptoms since 12/07, and in today with elevated blood pressure, will take out of work until 09/05/2016.edication reviewed in office and he is to become comliant if he is to improve his health and prolong his life  Obesity (BMI 30.0-34.9) Deteriorated. Patient re-educated about  the importance of commitment to a  minimum of 150 minutes of exercise per week.  The importance of healthy food choices with portion control discussed. Encouraged to start a food diary, count calories and to consider  joining a support group. Sample diet sheets offered. Goals set by the patient for the next several months.   Weight /BMI 08/30/2016 08/17/2016 05/16/2016  WEIGHT 251 lb 246 lb 1.3 oz 246 lb  HEIGHT 5\' 9"  5\' 9"  5\' 9"   BMI 37.07 kg/m2 36.34 kg/m2 36.33 kg/m2      Sleep disorder Has dx of sleep apnea, still needs to purchase cPAP machine

## 2016-08-30 NOTE — Assessment & Plan Note (Addendum)
Fatigue, uncontrolled hypertension, non specific t wavbe abnormalities, lAD and sinus bradycardia, abn eKG, needs card eval

## 2016-08-30 NOTE — Assessment & Plan Note (Signed)
Deteriorated. Patient re-educated about  the importance of commitment to a  minimum of 150 minutes of exercise per week.  The importance of healthy food choices with portion control discussed. Encouraged to start a food diary, count calories and to consider  joining a support group. Sample diet sheets offered. Goals set by the patient for the next several months.   Weight /BMI 08/30/2016 08/17/2016 05/16/2016  WEIGHT 251 lb 246 lb 1.3 oz 246 lb  HEIGHT 5\' 9"  5\' 9"  5\' 9"   BMI 37.07 kg/m2 36.34 kg/m2 36.33 kg/m2

## 2016-08-30 NOTE — Assessment & Plan Note (Signed)
Uncontrolled , managed by neurology, currently under significant psychologic stress and has uncontrolled hypertension. Reports uncontrolled symptoms since 12/07, and in today with elevated blood pressure, will take out of work until 09/05/2016.edication reviewed in office and he is to become comliant if he is to improve his health and prolong his life

## 2016-08-30 NOTE — Patient Instructions (Signed)
Nurse bP check and MD visit as before   Continue the two tablets, clonidine and hydrallazinr  Just as you are taking them New is amlodipine 10 mg one daily  You are referred to cardiology, fatigue, abn EKG and uncontrolled hypertension   Lab today, chem 7 and EGFr and cBC   Work excuse form 12/07 to return 09/05/2016

## 2016-08-30 NOTE — Assessment & Plan Note (Signed)
Has dx of sleep apnea, still needs to purchase cPAP machine

## 2016-08-30 NOTE — Assessment & Plan Note (Signed)
Uncontrolled , add amlodipine DASH diet and commitment to daily physical activity for a minimum of 30 minutes discussed and encouraged, as a part of hypertension management. The importance of attaining a healthy weight is also discussed.  BP/Weight 08/30/2016 08/17/2016 05/16/2016 02/04/2016 01/20/2016 5/39/7673 12/18/9377  Systolic BP 024 097 353 - 299 242 683  Diastolic BP 419 622 98 - 297 95 72  Wt. (Lbs) 251 246.08 246 240 230 235 235  BMI 37.07 36.34 36.33 35.43 33.95 34.69 31.86      work excuse to return 09/05/2016

## 2016-08-31 ENCOUNTER — Emergency Department (HOSPITAL_COMMUNITY): Payer: BLUE CROSS/BLUE SHIELD

## 2016-08-31 ENCOUNTER — Emergency Department (HOSPITAL_COMMUNITY)
Admission: EM | Admit: 2016-08-31 | Discharge: 2016-08-31 | Disposition: A | Discharge status: Home / Self Care | Payer: BLUE CROSS/BLUE SHIELD | Attending: Emergency Medicine | Admitting: Emergency Medicine

## 2016-08-31 ENCOUNTER — Encounter (HOSPITAL_COMMUNITY): Payer: Self-pay | Admitting: Emergency Medicine

## 2016-08-31 DIAGNOSIS — R0602 Shortness of breath: Secondary | ICD-10-CM | POA: Insufficient documentation

## 2016-08-31 DIAGNOSIS — Z87891 Personal history of nicotine dependence: Secondary | ICD-10-CM | POA: Insufficient documentation

## 2016-08-31 DIAGNOSIS — G9389 Other specified disorders of brain: Secondary | ICD-10-CM | POA: Insufficient documentation

## 2016-08-31 DIAGNOSIS — R110 Nausea: Secondary | ICD-10-CM | POA: Diagnosis present

## 2016-08-31 DIAGNOSIS — Z79899 Other long term (current) drug therapy: Secondary | ICD-10-CM | POA: Insufficient documentation

## 2016-08-31 DIAGNOSIS — I129 Hypertensive chronic kidney disease with stage 1 through stage 4 chronic kidney disease, or unspecified chronic kidney disease: Secondary | ICD-10-CM | POA: Insufficient documentation

## 2016-08-31 DIAGNOSIS — N183 Chronic kidney disease, stage 3 (moderate): Secondary | ICD-10-CM | POA: Diagnosis not present

## 2016-08-31 DIAGNOSIS — I160 Hypertensive urgency: Secondary | ICD-10-CM

## 2016-08-31 DIAGNOSIS — R079 Chest pain, unspecified: Secondary | ICD-10-CM | POA: Diagnosis not present

## 2016-08-31 DIAGNOSIS — R51 Headache: Secondary | ICD-10-CM | POA: Diagnosis not present

## 2016-08-31 LAB — BASIC METABOLIC PANEL
ANION GAP: 8 (ref 5–15)
BUN: 18 mg/dL (ref 6–20)
CALCIUM: 8.4 mg/dL — AB (ref 8.9–10.3)
CO2: 27 mmol/L (ref 22–32)
Chloride: 105 mmol/L (ref 101–111)
Creatinine, Ser: 2.24 mg/dL — ABNORMAL HIGH (ref 0.61–1.24)
GFR calc Af Amer: 38 mL/min — ABNORMAL LOW (ref >60)
GFR, EST NON AFRICAN AMERICAN: 33 mL/min — AB (ref >60)
Glucose, Bld: 108 mg/dL — ABNORMAL HIGH (ref 65–99)
POTASSIUM: 3.6 mmol/L (ref 3.5–5.1)
SODIUM: 140 mmol/L (ref 135–145)

## 2016-08-31 LAB — D-DIMER, QUANTITATIVE (NOT AT ARMC): D DIMER QUANT: 0.31 ug{FEU}/mL (ref 0.00–0.50)

## 2016-08-31 LAB — BASIC METABOLIC PANEL WITH GFR
BUN: 17 mg/dL (ref 7–25)
CALCIUM: 9 mg/dL (ref 8.6–10.3)
CO2: 28 mmol/L (ref 20–31)
CREATININE: 2.34 mg/dL — AB (ref 0.60–1.35)
Chloride: 105 mmol/L (ref 98–110)
GFR, EST AFRICAN AMERICAN: 37 mL/min — AB (ref >=60)
GFR, Est Non African American: 32 mL/min — ABNORMAL LOW (ref >=60)
GLUCOSE: 91 mg/dL (ref 65–99)
Potassium: 4.4 mmol/L (ref 3.5–5.3)
Sodium: 142 mmol/L (ref 135–146)

## 2016-08-31 LAB — CBC
HCT: 41.6 % (ref 38.5–50.0)
HEMATOCRIT: 39.8 % (ref 39.0–52.0)
Hemoglobin: 13.6 g/dL (ref 13.2–17.1)
Hemoglobin: 13.6 g/dL (ref 13.0–17.0)
MCH: 32.1 pg (ref 27.0–33.0)
MCH: 32.5 pg (ref 26.0–34.0)
MCHC: 32.7 g/dL (ref 32.0–36.0)
MCHC: 34.2 g/dL (ref 30.0–36.0)
MCV: 98.1 fL (ref 80.0–100.0)
MCV: 95.2 fL (ref 78.0–100.0)
MPV: 9.9 fL (ref 7.5–12.5)
PLATELETS: 262 10*3/uL (ref 140–400)
PLATELETS: 251 10*3/uL (ref 150–400)
RBC: 4.24 MIL/uL (ref 4.20–5.80)
RBC: 4.18 MIL/uL — ABNORMAL LOW (ref 4.22–5.81)
RDW: 12.7 % (ref 11.0–15.0)
RDW: 11.8 % (ref 11.5–15.5)
WBC: 4.6 10*3/uL (ref 3.8–10.8)
WBC: 4.6 10*3/uL (ref 4.0–10.5)

## 2016-08-31 LAB — TROPONIN I

## 2016-08-31 LAB — BRAIN NATRIURETIC PEPTIDE: B NATRIURETIC PEPTIDE 5: 71 pg/mL (ref 0.0–100.0)

## 2016-08-31 MED ORDER — ONDANSETRON HCL 4 MG/2ML IJ SOLN
INTRAMUSCULAR | Status: AC
  Filled 2016-08-31: qty 2

## 2016-08-31 MED ORDER — ONDANSETRON HCL 4 MG/2ML IJ SOLN
4.0000 mg | Freq: Once | INTRAMUSCULAR | Status: AC
  Administered 2016-08-31: 4 mg via INTRAVENOUS

## 2016-08-31 MED ORDER — AMLODIPINE BESYLATE 5 MG PO TABS
10.0000 mg | ORAL_TABLET | Freq: Once | ORAL | Status: AC
  Administered 2016-08-31: 10 mg via ORAL
  Filled 2016-08-31: qty 2

## 2016-08-31 MED ORDER — HYDRALAZINE HCL 20 MG/ML IJ SOLN
5.0000 mg | Freq: Once | INTRAMUSCULAR | Status: AC
  Administered 2016-08-31: 5 mg via INTRAVENOUS
  Filled 2016-08-31: qty 1

## 2016-08-31 MED ORDER — HYDRALAZINE HCL 20 MG/ML IJ SOLN
10.0000 mg | Freq: Once | INTRAMUSCULAR | Status: AC
  Administered 2016-08-31: 10 mg via INTRAVENOUS
  Filled 2016-08-31: qty 1

## 2016-08-31 MED ORDER — LABETALOL HCL 5 MG/ML IV SOLN
20.0000 mg | Freq: Once | INTRAVENOUS | Status: AC
  Administered 2016-08-31: 20 mg via INTRAVENOUS
  Filled 2016-08-31: qty 4

## 2016-08-31 MED ORDER — TRAMADOL HCL 50 MG PO TABS
50.0000 mg | ORAL_TABLET | Freq: Once | ORAL | Status: AC
  Administered 2016-08-31: 50 mg via ORAL
  Filled 2016-08-31: qty 1

## 2016-08-31 MED ORDER — ONDANSETRON HCL 4 MG/2ML IJ SOLN
4.0000 mg | Freq: Once | INTRAMUSCULAR | Status: AC
  Administered 2016-08-31: 4 mg via INTRAVENOUS
  Filled 2016-08-31: qty 2

## 2016-08-31 MED ORDER — MORPHINE SULFATE (PF) 4 MG/ML IV SOLN
4.0000 mg | Freq: Once | INTRAVENOUS | Status: AC
  Administered 2016-08-31: 4 mg via INTRAVENOUS
  Filled 2016-08-31: qty 1

## 2016-08-31 NOTE — Discharge Instructions (Signed)
Get your Norvasc prescription filled tomorrow. Follow-up with your doctor as planned Monday return sooner if problems

## 2016-08-31 NOTE — ED Provider Notes (Addendum)
[facility] DEPT Provider Note   CSN: [id number] Arrival date & time: 08/31/16  1529     History   Chief Complaint Chief Complaint  Patient presents with  . Shortness of Breath  . Chest Pain    HPI BOWDEN BOODY is a 45 y.o. male.  Patient complains of a headache and chest discomfort and shortness of breath.   The history is provided by the patient. No language interpreter was used.  Shortness of Breath  This is a new problem. The problem occurs intermittently.The current episode started less than 1 hour ago. The problem has not changed since onset.Associated symptoms include chest pain. Pertinent negatives include no fever, no headaches, no cough, no abdominal pain and no rash. It is unknown what precipitated the problem.  Chest Pain   Associated symptoms include nausea and shortness of breath. Pertinent negatives include no abdominal pain, no back pain, no cough, no fever and no headaches.  Pertinent negatives for past medical history include no seizures.    Past Medical History:  Diagnosis Date  . Chronic back pain   . Duodenitis with bleeding 08/2008   Ulcer - admitted to [facility]   . Essential hypertension, benign   . GERD (gastroesophageal reflux disease)   . Headache(784.0)   . Hyperlipidemia   . Migraines   . Obesity, unspecified     Patient Active Problem List   Diagnosis Date Noted  . Nonspecific abnormal electrocardiogram (ECG) (EKG) 08/30/2016  . Hyperlipidemia LDL goal <100 08/08/2015  . Sleep disorder 11/05/2014  . Obesity (BMI 30.0-34.9) 06/21/2014  . CKD (chronic kidney disease) stage 3, GFR 30-59 ml/min 02/23/2014  . Bilateral renal cysts 02/23/2014  . Nocturia 02/19/2014  . Degloving injury of finger 09/25/2013  . Fatigue due to sleep pattern disturbance 10/20/2008  . Essential hypertension 10/09/2007  . Chronic migraine 10/09/2007    Past Surgical History:  Procedure Laterality Date  . 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 FINGER DEBRIDEMENT Left 09/25/2013   Thumb  . FOOT FRACTURE SURGERY         Home Medications    Prior to Admission medications   Medication Sig Start Date End Date Taking? Authorizing Provider  cloNIDine (CATAPRES) 0.3 MG tablet Take 0.15-0.3 mg by mouth 2 (two) times daily. Half tablet in the morning and one tablet in the evening. Take medication 12 hours apart 08/29/16  Yes Fayrene Helper, MD  hydrALAZINE (APRESOLINE) 50 MG tablet Take 1 tablet (50 mg total) by mouth 3 (three) times daily. One tablet every 8 hours 08/29/16  Yes Fayrene Helper, MD  hydrOXYzine (VISTARIL) 50 MG capsule Take 1 capsule (50 mg total) by mouth daily. 08/17/16  Yes Fayrene Helper, MD  amLODipine (NORVASC) 10 MG tablet Take 1 tablet (10 mg total) by mouth daily. 08/30/16   Fayrene Helper, MD  amLODipine-olmesartan (AZOR) 10-40 MG tablet Take 1 tablet by mouth every morning. Patient not taking: Reported on 08/30/2016 02/23/16   Fayrene Helper, MD  nitroGLYCERIN (NITROSTAT) 0.4 MG SL tablet Place 1 tablet (0.4 mg total) under the tongue every 5 (five) minutes as needed for chest pain. 07/15/14   Satira Sark, MD  ondansetron (ZOFRAN) 4 MG tablet Take 1 tablet (4 mg total) by mouth every 8 (eight) hours as needed for nausea or  vomiting. 08/17/16   Fayrene Helper, MD  oxyCODONE (ROXICODONE) 5 MG immediate release tablet Take 1 tablet (5 mg total) by mouth every 6 (six) hours as needed for severe pain. Patient not taking: Reported on 08/31/2016 01/13/16   Evalee Jefferson, PA-C  oxyCODONE-acetaminophen (PERCOCET) 7.5-325 MG tablet Take 1 tablet by mouth every 6 (six) hours as needed for moderate pain.  10/30/15   Historical Provider, MD    Family History Family History  Problem Relation Age of Onset  . Diabetes Mother   .  Hypertension Mother   . Hypertension Father   . Arthritis Father   . Diabetes Father     Social History Social History  Substance Use Topics  . Smoking status: Former Smoker    Packs/day: 0.10    Types: Cigarettes    Quit date: 05/28/2012  . Smokeless tobacco: Never Used  . Alcohol use No     Allergies   Bee venom; Prochlorperazine edisylate; Cyclobenzaprine hcl; Aleve [naproxen sodium]; Carbamazepine; Geodon [ziprasidone hcl]; Ibuprofen; Maxzide [hydrochlorothiazide w-triamterene]; Nsaids; Tylenol [acetaminophen]; and Other   Review of Systems Review of Systems  Constitutional: Negative for appetite change, fatigue and fever.  HENT: Negative for congestion, ear discharge and sinus pressure.   Eyes: Negative for discharge.  Respiratory: Positive for shortness of breath. Negative for cough.   Cardiovascular: Positive for chest pain.  Gastrointestinal: Positive for nausea. Negative for abdominal pain and diarrhea.  Genitourinary: Negative for frequency and hematuria.  Musculoskeletal: Negative for back pain.  Skin: Negative for rash.  Neurological: Negative for seizures and headaches.  Psychiatric/Behavioral: Negative for hallucinations.     Physical Exam Updated Vital Signs BP (!) 165/103 (BP Location: Right Arm)   Pulse 65   Temp 98 F (36.7 C) (Oral)   Resp 14   Ht 5\' 9"  (1.753 m)   Wt 251 lb (113.9 kg)   SpO2 99%   BMI 37.07 kg/m   Physical Exam  Constitutional: He is oriented to person, place, and time. He appears well-developed.  HENT:  Head: Normocephalic.  Eyes: Conjunctivae and EOM are normal. No scleral icterus.  Neck: Neck supple. No thyromegaly present.  Cardiovascular: Normal rate and regular rhythm.  Exam reveals no gallop and no friction rub.   No murmur heard. Pulmonary/Chest: No stridor. He has no wheezes. He has no rales. He exhibits no tenderness.  Abdominal: He exhibits no distension. There is no tenderness. There is no rebound.    Musculoskeletal: Normal range of motion. He exhibits no edema.  Lymphadenopathy:    He has no cervical adenopathy.  Neurological: He is oriented to person, place, and time. He exhibits normal muscle tone. Coordination normal.  Skin: No rash noted. No erythema.  Psychiatric: He has a normal mood and affect. His behavior is normal.     ED Treatments / Results  Labs (all labs ordered are listed, but only abnormal results are displayed) Labs Reviewed  BASIC METABOLIC PANEL - Abnormal; Notable for the following:       Result Value   Glucose, Bld 108 (*)    Creatinine, Ser 2.24 (*)    Calcium 8.4 (*)    GFR calc non Af Amer 33 (*)    GFR calc Af Amer 38 (*)    All other components within normal limits  CBC - Abnormal; Notable for the following:    RBC 4.18 (*)    All other components within normal limits  TROPONIN I  BRAIN NATRIURETIC PEPTIDE  D-DIMER, QUANTITATIVE (NOT  AT [facility])    EKG  EKG Interpretation  Date/Time:  Wednesday August 31 2016 15:35:30 EST Ventricular Rate:  61 PR Interval:  158 QRS Duration: 94 QT Interval:  396 QTC Calculation: 398 R Axis:   -38 Text Interpretation:  Normal sinus rhythm Left axis deviation Nonspecific T wave abnormality Abnormal ECG No significant change since last tracing Confirmed by KNAPP  MD-J, JON ([id number]) on 08/31/2016 3:48:16 PM Also confirmed by Tris Howell  MD, Broadus John [phone])  on 08/31/2016 7:08:16 PM       Radiology Dg Chest 2 View  Result Date: 08/31/2016 CLINICAL DATA:  45 year old male with a history of left-sided chest pain EXAM: CHEST  2 VIEW COMPARISON:  06/14/2014, 10/21/2010 FINDINGS: Cardiomediastinal silhouette unchanged. No evidence of central vascular congestion. No pneumothorax. No pleural effusion. No confluent airspace disease. Similar appearance of chronic changes when compared to prior studies. IMPRESSION: No evidence of acute cardiopulmonary disease. Signed, Dulcy Fanny. Earleen Newport, DO [facility] Radiology Electronically Signed   By: Corrie Mckusick D.O.   On: 08/31/2016 15:51   Ct Head Wo Contrast  Result Date: 08/31/2016 CLINICAL DATA:  45 year old male with a history of headache EXAM: CT HEAD WITHOUT CONTRAST TECHNIQUE: Contiguous axial images were obtained from the base of the skull through the vertex without intravenous contrast. COMPARISON:  10/13/2015 FINDINGS: Brain: No acute intracranial hemorrhage. No midline shift or mass effect. Gray-white differentiation maintained. Unremarkable appearance of the ventricular system. Vascular: Unremarkable. Skull: No acute fracture.  No aggressive bone lesion identified. Sinuses/Orbits: Unremarkable appearance of the orbits. Mastoid air cells clear. No middle ear effusion. No significant sinus disease. Other: None IMPRESSION: No CT evidence of acute intracranial abnormality. Signed, Dulcy Fanny. Earleen Newport, DO [facility] Radiology Electronically Signed   By: Corrie Mckusick D.O.   On: 08/31/2016 21:53    Procedures Procedures (including critical care time)  Medications Ordered in ED Medications  hydrALAZINE (APRESOLINE) injection 5 mg (5 mg Intravenous Given 08/31/16 1934)  ondansetron (ZOFRAN) injection 4 mg (4 mg Intravenous Given 08/31/16 1933)  traMADol (ULTRAM) tablet 50 mg (50 mg Oral Given 08/31/16 2029)  morphine 4 MG/ML injection 4 mg (4 mg Intravenous Given 08/31/16 2118)  hydrALAZINE (APRESOLINE) injection 10 mg (10 mg Intravenous Given 08/31/16 2117)  ondansetron (ZOFRAN) injection 4 mg (4 mg Intravenous Given 08/31/16 2132)  labetalol (NORMODYNE,TRANDATE) injection 20 mg (20 mg Intravenous Given 08/31/16 2207)  amLODipine (NORVASC) tablet 10 mg (10 mg Oral Given 08/31/16 2235)     Initial Impression / Assessment and Plan / ED Course  I have reviewed the triage vital signs and the nursing notes.  Pertinent labs & imaging results that were available during my care  of the patient were reviewed by me and considered in my medical decision making (see chart for details).  Clinical Course   Patient symptoms improved when his blood pressure got under better control. Labs EKG chest x-ray unremarkable. CT of the head unremarkable. Diagnosis hypertensive urgency. Patient states that he was supposed to start Norvasc but has not filled the prescription yet. He is given some Norvasc before he goes home and is to follow-up Monday with his doctor CRITICAL CARE Performed by: Mollee Neer L Total critical care time: 35 minutes Critical care time was exclusive of separately billable procedures and treating other patients. Critical care was necessary to treat or prevent imminent or life-threatening deterioration. Critical care was time spent personally by me on the following activities: development of treatment  plan with patient and/or surrogate as well as nursing, discussions with consultants, evaluation of patient's response to treatment, examination of patient, obtaining history from patient or surrogate, ordering and performing treatments and interventions, ordering and review of laboratory studies, ordering and review of radiographic studies, pulse oximetry and re-evaluation of patient's condition.  Final Clinical Impressions(s) / ED Diagnoses   Final diagnoses:  Hypertensive urgency    New Prescriptions New Prescriptions   No medications on file     Milton Ferguson, MD 08/31/16 1610    Milton Ferguson, MD 08/31/16 2239

## 2016-08-31 NOTE — ED Triage Notes (Signed)
Patient complains of chest pain with shortness of breath starting yesterday. Denies N/VD.

## 2016-09-01 ENCOUNTER — Other Ambulatory Visit: Payer: Self-pay

## 2016-09-01 MED ORDER — AMLODIPINE BESYLATE 10 MG PO TABS: 10.0000 mg | ORAL_TABLET | Freq: Every day | ORAL | 3 refills | Status: DC

## 2016-09-05 ENCOUNTER — Other Ambulatory Visit: Payer: Self-pay | Admitting: Family Medicine

## 2016-09-05 ENCOUNTER — Ambulatory Visit (INDEPENDENT_AMBULATORY_CARE_PROVIDER_SITE_OTHER): Payer: BLUE CROSS/BLUE SHIELD | Admitting: Family Medicine

## 2016-09-05 ENCOUNTER — Encounter: Payer: Self-pay | Admitting: Family Medicine

## 2016-09-05 ENCOUNTER — Telehealth: Payer: Self-pay

## 2016-09-05 VITALS — BP 160/112 | HR 87 | Resp 16 | Ht 69.0 in | Wt 247.0 lb

## 2016-09-05 DIAGNOSIS — I10 Essential (primary) hypertension: Secondary | ICD-10-CM

## 2016-09-05 DIAGNOSIS — G43709 Chronic migraine without aura, not intractable, without status migrainosus: Secondary | ICD-10-CM | POA: Diagnosis not present

## 2016-09-05 DIAGNOSIS — IMO0002 Reserved for concepts with insufficient information to code with codable children: Secondary | ICD-10-CM

## 2016-09-05 DIAGNOSIS — R110 Nausea: Secondary | ICD-10-CM

## 2016-09-05 MED ORDER — DIPHENHYDRAMINE HCL 50 MG/ML IJ SOLN
50.0000 mg | Freq: Once | INTRAMUSCULAR | Status: AC
  Administered 2016-09-05: 50 mg via INTRAMUSCULAR

## 2016-09-05 MED ORDER — LABETALOL HCL 100 MG PO TABS: 100.0000 mg | ORAL_TABLET | Freq: Two times a day (BID) | ORAL | 3 refills | Status: DC

## 2016-09-05 MED ORDER — ONDANSETRON HCL 4 MG/2ML IJ SOLN
4.0000 mg | Freq: Once | INTRAMUSCULAR | Status: AC
  Administered 2016-09-05: 4 mg via INTRAMUSCULAR

## 2016-09-05 NOTE — Telephone Encounter (Signed)
Urgent referral made to cardiologyu, have him come for nurse bP check with meds this afternoon,  I will also check BP and likely increase hydrallazine dose. I knmwohe was in the eD , needs  Extra work excuse  Needs to see Dr Merlene Laughter re headaches , he ahs already established with hiom so call for appt re headache

## 2016-09-05 NOTE — Telephone Encounter (Signed)
Given appt for this afternoon.

## 2016-09-05 NOTE — Telephone Encounter (Signed)
Patient scheduled for ov previously

## 2016-09-05 NOTE — Patient Instructions (Addendum)
Nurse BP check in 3 days , Thursday, call if you need me sooner  Work excuse from today return next week Thursday  Benadryl 50 mg IM and zofran 4mg  IM in office today for headache and nausea WITH TWO TYLENOL 500 MG TWO TABLETS  New ADDITIONAL Bp MEDICATION FOR TWICE DAILY DOSING IS LABETALOL 100 MG , TAKE ALOnG WITH CLONDINE AND HYDRALLAZINE TWICE DAILY AS BEFORE aND ALSO AMLODIPINE 10 MG ONE DAILY  hOPE YOU FEEL BETTER SOON

## 2016-09-05 NOTE — Progress Notes (Signed)
   [name]     MRN: [id number]      DOB: 1971/05/23   HPI [name] is here following up from visit 1 week ago, when he presented with uncontrolled blood pressure. Was seen the next day in the ED for SOB, at that visit the  bP was still high C/o daily pounding headache with nausea since last week Tuesday. Has vomitted on avg once every day, sometimes twice, did not vomit only yesterday Sunday.   ROS Denies recent fever or chills. Denies sinus pressure, nasal congestion, ear pain or sore throat. Denies chest congestion, productive cough or wheezing. Denies chest pains, palpitations and leg swelling Denies abdominal pain,diarrhea or constipation.   Denies dysuria, frequency, hesitancy or incontinence. Denies joint pain, swelling and limitation in mobility.  Denies skin break down or rash.   PE  BP (!) 160/112   Pulse 87   Resp 16   Ht 5\' 9"  (1.753 m)   Wt 247 lb (112 kg)   SpO2 95%   BMI 36.48 kg/m   Patient alert and oriented looks distressed, anxious and in pain.  HEENT: No facial asymmetry, EOMI,   oropharynx pink and moist.  Neck supple no JVD, no mass.  Chest: Clear to auscultation bilaterally.  CVS: S1, S2 no murmurs, no S3.Regular rate.  ABD: Soft non tender.   Ext: No edema  MS: Adequate ROM spine, shoulders, hips and knees.  Skin: Intact, no ulcerations or rash noted.  Psych: Good eye contact, normal affect. Memory intact  anxious not depressed appearing.  CNS: CN 2-12 intact, power,  normal throughout.no focal deficits noted.   Assessment & Plan  Malignant hypertension Uncontrolled, work excuse, additional medication added with 3 day follow up [place] and commitment to daily physical activity for a minimum of 30 minutes discussed and encouraged, as a part of hypertension management. The importance of attaining a healthy weight is also discussed.  BP/Weight 09/08/2016 09/05/2016 08/31/2016 08/30/2016 08/17/2016 05/16/2016 2/70/6237    Systolic BP 628 315 176 160 737 106 -  Diastolic BP 88 269 485 462 100 98 -  Wt. (Lbs) - 247 251 251 246.08 246 240  BMI - 36.48 37.07 37.07 36.34 36.33 35.43       Chronic migraine Uncontrolled headache, with nmausea, benadryl, tylenol and zofran in office  Nausea without vomiting Has vomited in recent past, none on day of visit, but severe nausea persists, zofran administered IM in office

## 2016-09-06 ENCOUNTER — Other Ambulatory Visit: Payer: Self-pay

## 2016-09-06 MED ORDER — LABETALOL HCL 100 MG PO TABS: 100.0000 mg | ORAL_TABLET | Freq: Two times a day (BID) | ORAL | 3 refills | Status: DC

## 2016-09-08 ENCOUNTER — Ambulatory Visit: Payer: BLUE CROSS/BLUE SHIELD

## 2016-09-08 VITALS — BP 128/88

## 2016-09-08 DIAGNOSIS — I10 Essential (primary) hypertension: Secondary | ICD-10-CM

## 2016-09-08 NOTE — Progress Notes (Signed)
BP was better today since started the most recent bp medication. Advised to call back if BP elevates

## 2016-09-12 DIAGNOSIS — R112 Nausea with vomiting, unspecified: Secondary | ICD-10-CM | POA: Insufficient documentation

## 2016-09-12 NOTE — Assessment & Plan Note (Signed)
Has vomited in recent past, none on day of visit, but severe nausea persists, zofran administered IM in office

## 2016-09-12 NOTE — Assessment & Plan Note (Signed)
Uncontrolled headache, with nmausea, benadryl, tylenol and zofran in office

## 2016-09-12 NOTE — Assessment & Plan Note (Signed)
Uncontrolled, work excuse, additional medication added with 3 day follow up [place] and commitment to daily physical activity for a minimum of 30 minutes discussed and encouraged, as a part of hypertension management. The importance of attaining a healthy weight is also discussed.  BP/Weight 09/08/2016 09/05/2016 08/31/2016 08/30/2016 08/17/2016 05/16/2016 0/62/6948  Systolic BP 546 270 350 093 818 299 -  Diastolic BP 88 371 696 789 100 98 -  Wt. (Lbs) - 247 251 251 246.08 246 240  BMI - 36.48 37.07 37.07 36.34 36.33 35.43

## 2016-09-13 ENCOUNTER — Other Ambulatory Visit: Payer: Self-pay

## 2016-09-13 ENCOUNTER — Ambulatory Visit: Payer: BLUE CROSS/BLUE SHIELD

## 2016-09-13 VITALS — BP 156/88

## 2016-09-13 DIAGNOSIS — I10 Essential (primary) hypertension: Secondary | ICD-10-CM

## 2016-09-13 MED ORDER — HYDRALAZINE HCL 50 MG PO TABS: 50.0000 mg | ORAL_TABLET | Freq: Three times a day (TID) | ORAL | 2 refills | Status: DC

## 2016-09-13 NOTE — Progress Notes (Signed)
Improvement in BP noted.   Patient will continue to take meds as ordered and keep next scheduled appt

## 2016-09-15 ENCOUNTER — Telehealth: Payer: Self-pay

## 2016-09-15 ENCOUNTER — Other Ambulatory Visit: Payer: Self-pay | Admitting: Family Medicine

## 2016-09-15 ENCOUNTER — Other Ambulatory Visit: Payer: Self-pay

## 2016-09-15 DIAGNOSIS — I10 Essential (primary) hypertension: Secondary | ICD-10-CM | POA: Diagnosis not present

## 2016-09-15 DIAGNOSIS — Z79899 Other long term (current) drug therapy: Secondary | ICD-10-CM | POA: Diagnosis not present

## 2016-09-15 DIAGNOSIS — G4459 Other complicated headache syndrome: Secondary | ICD-10-CM | POA: Diagnosis not present

## 2016-09-15 DIAGNOSIS — G4733 Obstructive sleep apnea (adult) (pediatric): Secondary | ICD-10-CM | POA: Diagnosis not present

## 2016-09-15 MED ORDER — AZITHROMYCIN 250 MG PO TABS: ORAL_TABLET | ORAL | 0 refills | Status: DC

## 2016-09-15 MED ORDER — BENZONATATE 100 MG PO CAPS: 100.0000 mg | ORAL_CAPSULE | Freq: Two times a day (BID) | ORAL | 0 refills | Status: DC | PRN

## 2016-09-15 NOTE — Telephone Encounter (Signed)
I have sent a z pack and tessalon perles to his local pharmacy. If he remains symptomatic or worsens in next 3 days will need to go to urgent care/ED or call to be seen next week in office, pls let him know

## 2016-09-15 NOTE — Telephone Encounter (Signed)
Called and left message for patient notifying of rx and advice.

## 2016-09-27 ENCOUNTER — Ambulatory Visit: Payer: Self-pay | Admitting: Family Medicine

## 2016-09-28 ENCOUNTER — Encounter: Payer: Self-pay | Admitting: Family Medicine

## 2016-10-05 ENCOUNTER — Ambulatory Visit: Payer: Self-pay | Admitting: Family Medicine

## 2016-10-07 ENCOUNTER — Encounter: Payer: Self-pay | Admitting: Cardiovascular Disease

## 2016-10-07 ENCOUNTER — Ambulatory Visit: Payer: BLUE CROSS/BLUE SHIELD | Admitting: Cardiovascular Disease

## 2016-10-07 ENCOUNTER — Ambulatory Visit (INDEPENDENT_AMBULATORY_CARE_PROVIDER_SITE_OTHER): Payer: BLUE CROSS/BLUE SHIELD | Admitting: Cardiovascular Disease

## 2016-10-07 VITALS — BP 160/100 | HR 85 | Ht 69.0 in | Wt 249.0 lb

## 2016-10-07 DIAGNOSIS — Z23 Encounter for immunization: Secondary | ICD-10-CM | POA: Diagnosis not present

## 2016-10-07 DIAGNOSIS — N183 Chronic kidney disease, stage 3 unspecified: Secondary | ICD-10-CM

## 2016-10-07 DIAGNOSIS — I1310 Hypertensive heart and chronic kidney disease without heart failure, with stage 1 through stage 4 chronic kidney disease, or unspecified chronic kidney disease: Secondary | ICD-10-CM | POA: Diagnosis not present

## 2016-10-07 DIAGNOSIS — R9431 Abnormal electrocardiogram [ECG] [EKG]: Secondary | ICD-10-CM | POA: Diagnosis not present

## 2016-10-07 DIAGNOSIS — R072 Precordial pain: Secondary | ICD-10-CM

## 2016-10-07 DIAGNOSIS — R0602 Shortness of breath: Secondary | ICD-10-CM | POA: Diagnosis not present

## 2016-10-07 MED ORDER — HYDRALAZINE HCL 25 MG PO TABS: 75.0000 mg | ORAL_TABLET | Freq: Three times a day (TID) | ORAL | 3 refills | Status: DC

## 2016-10-07 NOTE — Progress Notes (Signed)
CARDIOLOGY CONSULT NOTE  Patient ID: [name] MRN: [id number] DOB/AGE: 22-Nov-1970 46 y.o.  Admit date: (Not on file) Primary Physician: Tula Nakayama, MD Referring Physician:   Reason for Consultation: malignant hypertension, abnormal ECG  HPI: 46 yr old male with multiple adverse reactions to several medications referred for malignant hypertension management and an abnormal ECG.  Was 160/112 on 09/05/16 at PCP's office.  Was evaluated in the ED for shortness of breath and chest pain 08/31/16. Was diagnosed with hypertensive urgency and he had apparently not filled his amlodipine prescription at that time.  D-dimer, BNP, and troponin were normal.  Creatinine elevated (2.24), GFR 38 ml/min.  Chest xray had no acute findings.  ECG showed sinus rhythm with a nonspecific T wave abnormality.  Nuclear stress test (07/11/14): IMPRESSION: 1. Mild partially reversible inferior wall defect. There is likely a component of subdiaphragmatic attenuation, however based on findings described above mild inferior wall ischemia is also suggested.  2. Normal left ventricular wall motion.  3. Left ventricular ejection fraction 49%  4. Duke treadmill score of 9.5, consitent with low risk for major cardiac events. Excellent exercise functional capacity (130% of predicted based on age and gender)  4. Low-risk stress test findings. Low risk based on area of mild ischemia, low risk Duke score, and excellent functional capacity.  Renal ultrasound showed renal cysts and medical renal disease on 02/24/14.   He had chest pain last night which lasted 25 minutes with radiation into left shoulder. This was the first episode since he was seen in the ED in December. Was aggravated with deep breaths.  Was told he has sleep apnea and has been fitted but has yet to get CPAP due to insurance reasons. Has constant headaches. Said he was initially diagnosed with high blood pressure in  his early 53's.  Soc: Works at [employer] in [place] where he helps to [employer].     Allergies  Allergen Reactions  . Bee Venom Anaphylaxis  . Prochlorperazine Edisylate Other (See Comments)    Reaction: Jittery,uncomfortable feeling  . Cyclobenzaprine Hcl Other (See Comments)    REACTION: feel anxious, nausea  . Aleve [Naproxen Sodium] Nausea And Vomiting    States that this medication also causes abdominal pain  . Carbamazepine     REACTION: unknown reaction  . Geodon [Ziprasidone Hcl] Nausea And Vomiting  . Ibuprofen Nausea And Vomiting    States that this medication also causes abdominal pain  . Maxzide [Hydrochlorothiazide W-Triamterene]     headache  . Nsaids Other (See Comments)    H/o severe anemia from UGIB from ulcer  . Tylenol [Acetaminophen] Nausea And Vomiting    Patient states that this medication also causes abdominal pain  . Other Nausea And Vomiting, Rash and Other (See Comments)    ALL MUSCLE RELAXANTS: states that they affect sciatic nerve, may cause nausea and vomiting, rash    Current Outpatient Prescriptions  Medication Sig Dispense Refill  . amLODipine (NORVASC) 10 MG tablet Take 1 tablet (10 mg total) by mouth daily. 30 tablet 3  . azithromycin (ZITHROMAX) 250 MG tablet Two tablets on day one, then one tablet once daily for an additional 4 days 6 tablet 0  . benzonatate (TESSALON) 100 MG capsule Take 1 capsule (100 mg total) by mouth 2 (two) times daily as needed for cough. 20 capsule 0  . cloNIDine (CATAPRES) 0.3 MG tablet Take 0.15-0.3 mg by mouth 2 (two) times daily.  Half tablet in the morning and one tablet in the evening. Take medication 12 hours apart 45 tablet 2  . hydrALAZINE (APRESOLINE) 50 MG tablet Take 1 tablet (50 mg total) by mouth 3 (three) times daily. One tablet every 8 hours 90 tablet 2  . hydrOXYzine (VISTARIL) 50 MG capsule Take 1 capsule (50 mg total) by mouth daily. 30 capsule 5  . labetalol (NORMODYNE) 100 MG  tablet Take 1 tablet (100 mg total) by mouth 2 (two) times daily. 60 tablet 3  . nitroGLYCERIN (NITROSTAT) 0.4 MG SL tablet Place 1 tablet (0.4 mg total) under the tongue every 5 (five) minutes as needed for chest pain. 25 tablet 1  . oxyCODONE (ROXICODONE) 5 MG immediate release tablet Take 1 tablet (5 mg total) by mouth every 6 (six) hours as needed for severe pain. 20 tablet 0   No current facility-administered medications for this visit.     Past Medical History:  Diagnosis Date  . Chronic back pain   . Duodenitis with bleeding 08/2008   Ulcer - [facility] to [facility]   . Essential hypertension, benign   . GERD (gastroesophageal reflux disease)   . Headache(784.0)   . Hyperlipidemia   . Migraines   . Obesity, unspecified     Past Surgical History:  Procedure Laterality Date  . 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 FINGER DEBRIDEMENT Left 09/25/2013   Thumb  . FOOT FRACTURE SURGERY      Social History   Social History  . Marital status: Married    Spouse name: N/A  . Number of children: 4  . Years of education: N/A   Occupational History  . unemployed     Social History Main Topics  . Smoking status: Former Smoker    Packs/day: 0.10    Types: Cigarettes    Quit date: 05/28/2012  . Smokeless tobacco: Never Used  . Alcohol use No  . Drug use: No  . Sexual activity: Not on file   Other Topics Concern  . Not on file   Social History Narrative  . No narrative on file     No family history of premature CAD in 1st degree relatives.  Prior to Admission medications   Medication Sig Start Date End Date Taking? Authorizing Provider  amLODipine (NORVASC) 10 MG tablet Take 1 tablet (10 mg total) by mouth daily. 09/01/16   Fayrene Helper, MD  azithromycin (ZITHROMAX) 250 MG tablet Two  tablets on day one, then one tablet once daily for an additional 4 days 09/15/16   Fayrene Helper, MD  benzonatate (TESSALON) 100 MG capsule Take 1 capsule (100 mg total) by mouth 2 (two) times daily as needed for cough. 09/15/16   Fayrene Helper, MD  cloNIDine (CATAPRES) 0.3 MG tablet Take 0.15-0.3 mg by mouth 2 (two) times daily. Half tablet in the morning and one tablet in the evening. Take medication 12 hours apart 08/29/16   Fayrene Helper, MD  hydrALAZINE (APRESOLINE) 50 MG tablet Take 1 tablet (50 mg total) by mouth 3 (three) times daily. One tablet every 8 hours 09/13/16   Fayrene Helper, MD  hydrOXYzine (VISTARIL) 50 MG capsule Take 1 capsule (50 mg total) by mouth daily. 08/17/16   Fayrene Helper, MD  labetalol (NORMODYNE) 100 MG tablet Take 1 tablet (  100 mg total) by mouth 2 (two) times daily. 09/06/16   Fayrene Helper, MD  nitroGLYCERIN (NITROSTAT) 0.4 MG SL tablet Place 1 tablet (0.4 mg total) under the tongue every 5 (five) minutes as needed for chest pain. 07/15/14   Satira Sark, MD  oxyCODONE (ROXICODONE) 5 MG immediate release tablet Take 1 tablet (5 mg total) by mouth every 6 (six) hours as needed for severe pain. Patient not taking: Reported on 08/31/2016 01/13/16   Evalee Jefferson, PA-C     Review of systems complete and found to be negative unless listed above in HPI     Physical exam Blood pressure (!) 160/100, pulse 85, height 5\' 9"  (1.753 m), weight 249 lb (112.9 kg), SpO2 98 %. General: NAD Neck: No JVD, no thyromegaly or thyroid nodule.  Lungs: Clear to auscultation bilaterally with normal respiratory effort. CV: Nondisplaced PMI. Regular rate and rhythm, normal S1/S2, no S3/S4, no murmur.  No peripheral edema.  No carotid bruit.  Normal pedal pulses.  Abdomen: Soft, nontender, no hepatosplenomegaly, no distention.  Skin: Intact without lesions or rashes.  Neurologic: Alert and oriented x 3.  Psych: Normal affect. Extremities: No clubbing  or cyanosis.  HEENT: Normal.   ECG: Most recent ECG reviewed.  Labs:   Lab Results  Component Value Date   WBC 4.6 08/31/2016   HGB 13.6 08/31/2016   HCT 39.8 08/31/2016   MCV 95.2 08/31/2016   PLT 251 08/31/2016   No results for input(s): NA, K, CL, CO2, BUN, CREATININE, CALCIUM, PROT, BILITOT, ALKPHOS, ALT, AST, GLUCOSE in the last 168 hours.  Invalid input(s): LABALBU Lab Results  Component Value Date   TROPONINI <0.03 08/31/2016    Lab Results  Component Value Date   CHOL 103 (L) 09/22/2015   CHOL 135 09/30/2014   CHOL 101 10/01/2013   Lab Results  Component Value Date   HDL 36 (L) 09/22/2015   HDL 37 (L) 09/30/2014   HDL 43 10/01/2013   Lab Results  Component Value Date   LDLCALC 54 09/22/2015   LDLCALC 78 09/30/2014   LDLCALC 46 10/01/2013   Lab Results  Component Value Date   TRIG 63 09/22/2015   TRIG 102 09/30/2014   TRIG 60 10/01/2013   Lab Results  Component Value Date   CHOLHDL 2.9 09/22/2015   CHOLHDL 3.6 09/30/2014   CHOLHDL 2.3 10/01/2013   No results found for: LDLDIRECT       Studies: No results found.  ASSESSMENT AND PLAN:  1. Malignant hypertension with chest pain: Markedly elevated. Currently on amlodipine 10 mg, clonidine 0.3 mg bid, hydralazine 50 mg tid, and labetalol 100 mg bid. Will increase hydralazine to 75 mg tid. I will order a 2-D echocardiogram with Doppler to evaluate cardiac structure, function, and regional wall motion. Would consider renal artery Dopplers in the future to evaluate for renal artery stenosis. Sleep apnea is also contributing. He should be obtaining CPAP in the near future. Adverse effects of uncontrolled BP (CVA, MI, need for dialysis) explained at length, and thus importance of taking medications.  2. Abnormal ECG with chest pain: Low risk stress test in 06/2014. Will repeat (Lexiscan Myoview) in the future after BP is well controlled. I will order a 2-D echocardiogram with Doppler to evaluate cardiac  structure, function, and regional wall motion.  3. 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 Will need to control BP to avoid need for dialysis in the future.   Dispo: fu 6 weeks  Signed: Kate Sable, M.D., F.A.C.C.  10/07/2016, 3:57 PM

## 2016-10-07 NOTE — Patient Instructions (Signed)
Your physician recommends that you schedule a follow-up appointment in: 6 weeks with Dr Bronson Ing    INCREASE Hydralazine to 75 mg  Three times a day      Your physician has requested that you have an echocardiogram. Echocardiography is a painless test that uses sound waves to create images of your heart. It provides your doctor with information about the size and shape of your heart and how well your heart's chambers and valves are working. This procedure takes approximately one hour. There are no restrictions for this procedure.      Thank you for choosing [facility] !

## 2016-10-10 ENCOUNTER — Ambulatory Visit (HOSPITAL_COMMUNITY): Admission: RE | Admit: 2016-10-10 | Payer: BLUE CROSS/BLUE SHIELD | Source: Ambulatory Visit

## 2016-10-10 ENCOUNTER — Telehealth: Payer: Self-pay

## 2016-10-10 NOTE — Telephone Encounter (Signed)
Does not require work excuse for a blood pressure of 160/100, saw cardiology on 1/19, NEEDS to be taking the medications as cardiology prescribed, call cardiology if he has problems with the medication side effects, or other symptoms that MAY allow them to extend his work note. MOST importantly needs to consitently take the medication so blood pressure can be controlled and health improved

## 2016-10-11 NOTE — Telephone Encounter (Signed)
Patient aware.

## 2016-10-14 ENCOUNTER — Other Ambulatory Visit: Payer: Self-pay

## 2016-10-14 MED ORDER — AMLODIPINE BESYLATE 10 MG PO TABS: 10.0000 mg | ORAL_TABLET | Freq: Every day | ORAL | 3 refills | Status: DC

## 2016-10-26 ENCOUNTER — Other Ambulatory Visit: Payer: Self-pay

## 2016-10-26 DIAGNOSIS — Z125 Encounter for screening for malignant neoplasm of prostate: Secondary | ICD-10-CM | POA: Diagnosis not present

## 2016-10-26 DIAGNOSIS — I10 Essential (primary) hypertension: Secondary | ICD-10-CM | POA: Diagnosis not present

## 2016-10-26 LAB — TSH: TSH: 0.93 m[IU]/L (ref 0.40–4.50)

## 2016-10-26 LAB — CBC
HEMATOCRIT: 38.6 % (ref 38.5–50.0)
HEMOGLOBIN: 12.6 g/dL — AB (ref 13.2–17.1)
MCH: 31 pg (ref 27.0–33.0)
MCHC: 32.6 g/dL (ref 32.0–36.0)
MCV: 94.8 fL (ref 80.0–100.0)
MPV: 9.4 fL (ref 7.5–12.5)
Platelets: 257 10*3/uL (ref 140–400)
RBC: 4.07 MIL/uL — ABNORMAL LOW (ref 4.20–5.80)
RDW: 12.7 % (ref 11.0–15.0)
WBC: 3.8 10*3/uL (ref 3.8–10.8)

## 2016-10-26 LAB — BASIC METABOLIC PANEL WITH GFR
BUN: 25 mg/dL (ref 7–25)
CHLORIDE: 106 mmol/L (ref 98–110)
CO2: 26 mmol/L (ref 20–31)
Calcium: 8.6 mg/dL (ref 8.6–10.3)
Creat: 2.61 mg/dL — ABNORMAL HIGH (ref 0.60–1.35)
GFR, EST AFRICAN AMERICAN: 32 mL/min — AB (ref >=60)
GFR, Est Non African American: 28 mL/min — ABNORMAL LOW (ref >=60)
Glucose, Bld: 95 mg/dL (ref 65–99)
POTASSIUM: 4.1 mmol/L (ref 3.5–5.3)
SODIUM: 141 mmol/L (ref 135–146)

## 2016-10-26 LAB — PSA: PSA: 1 ng/mL (ref <=4.0)

## 2016-10-26 MED ORDER — CLONIDINE HCL 0.3 MG PO TABS: 0.1500 mg | ORAL_TABLET | Freq: Two times a day (BID) | ORAL | 2 refills | Status: DC

## 2016-10-31 ENCOUNTER — Encounter: Payer: Self-pay | Admitting: Family Medicine

## 2016-10-31 ENCOUNTER — Ambulatory Visit (INDEPENDENT_AMBULATORY_CARE_PROVIDER_SITE_OTHER): Payer: BLUE CROSS/BLUE SHIELD | Admitting: Family Medicine

## 2016-10-31 VITALS — BP 140/90 | HR 64 | Resp 15 | Ht 69.0 in | Wt 247.0 lb

## 2016-10-31 DIAGNOSIS — E669 Obesity, unspecified: Secondary | ICD-10-CM | POA: Diagnosis not present

## 2016-10-31 DIAGNOSIS — IMO0002 Reserved for concepts with insufficient information to code with codable children: Secondary | ICD-10-CM

## 2016-10-31 DIAGNOSIS — R5383 Other fatigue: Secondary | ICD-10-CM | POA: Diagnosis not present

## 2016-10-31 DIAGNOSIS — I10 Essential (primary) hypertension: Secondary | ICD-10-CM

## 2016-10-31 DIAGNOSIS — F4329 Adjustment disorder with other symptoms: Secondary | ICD-10-CM

## 2016-10-31 DIAGNOSIS — G479 Sleep disorder, unspecified: Secondary | ICD-10-CM

## 2016-10-31 DIAGNOSIS — G43709 Chronic migraine without aura, not intractable, without status migrainosus: Secondary | ICD-10-CM

## 2016-10-31 NOTE — Patient Instructions (Signed)
F/u in 3 month, call if you need me before  NEED to take Blood pressure medications, all 4 as prescribed  Need to use CPAP machine as soon as you get it  NEED to keep appointments with cardiology and neurology  Need to schedule heart test which you missed  Work excuse to return the day after you see the cardiologist starting from the last day that you worked  NEED TO [place]

## 2016-11-02 ENCOUNTER — Ambulatory Visit (HOSPITAL_COMMUNITY)
Admission: RE | Admit: 2016-11-02 | Discharge: 2016-11-02 | Disposition: A | Discharge status: Home / Self Care | Payer: BLUE CROSS/BLUE SHIELD | Source: Ambulatory Visit | Attending: Cardiovascular Disease | Admitting: Cardiovascular Disease

## 2016-11-02 DIAGNOSIS — I083 Combined rheumatic disorders of mitral, aortic and tricuspid valves: Secondary | ICD-10-CM | POA: Diagnosis not present

## 2016-11-02 DIAGNOSIS — I313 Pericardial effusion (noninflammatory): Secondary | ICD-10-CM | POA: Diagnosis not present

## 2016-11-02 DIAGNOSIS — R072 Precordial pain: Secondary | ICD-10-CM | POA: Diagnosis not present

## 2016-11-02 DIAGNOSIS — I1310 Hypertensive heart and chronic kidney disease without heart failure, with stage 1 through stage 4 chronic kidney disease, or unspecified chronic kidney disease: Secondary | ICD-10-CM | POA: Insufficient documentation

## 2016-11-02 LAB — ECHOCARDIOGRAM COMPLETE
AV Mean grad: 4 mmHg
AV pk vel: 152 cm/s
AV vel: 3.23
AVAREAMEANV: 3.52 cm2
AVAREAMEANVIN: 1.48 cm2/m2
AVAREAVTI: 3.1 cm2
AVAREAVTIIND: 1.36 cm2/m2
AVCELMEANRAT: 0.72
AVLVOTPG: 4 mmHg
AVPG: 9 mmHg
Ao pk vel: 0.63 m/s
CHL CUP AV PEAK INDEX: 1.3
CHL CUP AV VALUE AREA INDEX: 1.36
CHL CUP DOP CALC LVOT VTI: 22.6 cm
CHL CUP STROKE VOLUME: 68 mL
DOP CAL AO MEAN VELOCITY: 93.4 cm/s
EERAT: 8.32
EWDT: 250 ms
FS: 40 % (ref 28–44)
IV/PV OW: 1.08
LA ID, A-P, ES: 39 mm
LA diam end sys: 39 mm
LA diam index: 1.64 cm/m2
LA vol A4C: 57.8 ml
LA vol: 58.6 mL
LAVOLIN: 24.6 mL/m2
LDCA: 4.91 cm2
LV E/e' medial: 8.32
LV E/e'average: 8.32
LV PW d: 14 mm — AB (ref 0.6–1.1)
LV SIMPSON'S DISK: 63
LV dias vol index: 45 mL/m2
LV dias vol: 108 mL (ref 62–150)
LV e' LATERAL: 7.62 cm/s
LV sys vol index: 17 mL/m2
LV sys vol: 40 mL (ref 21–61)
LVOT SV: 111 mL
LVOT diameter: 25 mm
LVOT peak VTI: 0.66 cm
LVOTPV: 95.9 cm/s
MV Dec: 250
MV pk A vel: 55.2 m/s
MV pk E vel: 63.4 m/s
P 1/2 time: 937 ms
RV LATERAL S' VELOCITY: 13.8 cm/s
RV sys press: 27 mmHg
Reg peak vel: 243 cm/s
TAPSE: 27 mm
TDI e' lateral: 7.62
TDI e' medial: 6.31
TRMAXVEL: 243 cm/s
VTI: 34.4 cm
Valve area: 3.23 cm2

## 2016-11-02 NOTE — Progress Notes (Signed)
*  PRELIMINARY RESULTS* Echocardiogram 2D Echocardiogram has been performed.  Samuel Germany 11/02/2016, 2:48 PM

## 2016-11-04 ENCOUNTER — Telehealth: Payer: Self-pay

## 2016-11-04 ENCOUNTER — Other Ambulatory Visit: Payer: Self-pay

## 2016-11-04 DIAGNOSIS — F4329 Adjustment disorder with other symptoms: Secondary | ICD-10-CM

## 2016-11-04 HISTORY — DX: Adjustment disorder with other symptoms: F43.29

## 2016-11-04 NOTE — Progress Notes (Signed)
[name]     MRN: [id number]      DOB: [birth year]   HPI [name] is here for follow up and re-evaluation of chronic medical conditions, medication management and review of any available recent lab and radiology data.  Preventive health is updated, specifically  Cancer screening and Immunization.   Questions or concerns regarding consultations or procedures which the PT has had in the interim are  addressed. The PT states his BP meds make him feel "very tired" still not taking as prescribed on a consistent basis. Has not worked for weeks, and still feels incapable of working currently.He has further testing and re evaluation by cardiology also Still c/o daily headache and chronic fatigue, plans to get CPAP machine this week C/o anxiety , stress and worry over bills and his ability to care for his family , also poor sleep   ROS See HPI  Denies recent fever or chills. Denies sinus pressure, nasal congestion, ear pain or sore throat. Denies chest congestion, productive cough or wheezing. Denies chest pains, palpitations and leg swelling Denies abdominal pain, nausea, vomiting,diarrhea or constipation.   Denies dysuria, frequency, hesitancy or incontinence. Denies joint pain, swelling and limitation in mobility. Denies skin break down or rash.   PE  BP 140/90   Pulse 64   Resp 15   Ht 5\' 9"  (1.753 m)   Wt 247 lb (112 kg)   SpO2 98%   BMI 36.48 kg/m   Patient alert and oriented and in no cardiopulmonary distress.  HEENT: No facial asymmetry, EOMI,   oropharynx pink and moist.  Neck supple no JVD, no mass.  Chest: Clear to auscultation bilaterally.  CVS: S1, S2 no murmurs, no S3.Regular rate.  ABD: Soft non tender.   Ext: No edema  MS: Adequate ROM spine, shoulders, hips and knees.  Skin: Intact, no ulcerations or rash noted.  Psych: Good eye contact,. Memory intact mildly anxious or depressed appearing.  CNS: CN 2-12 intact, power,  normal throughout.no  focal deficits noted.   Assessment & Plan  Malignant hypertension Ongoing   Uncontrolled hypertension with chronic fatigue and poor level of function Work excuse to cover prolonged period due to poor health. Needs to f/u with cardiology including getting echocardiogram Pt states he is incapable of focus and concentration due to fatigue and chronic headaches.  Still not taking all of his antihypertensive medication as prescribed, states he feels "wiped out" Plans to get CPAP machine this week which will help DASH diet and commitment to daily physical activity for a minimum of 30 minutes discussed and encouraged, as a part of hypertension management. The importance of attaining a healthy weight is also discussed.  BP/Weight 10/31/2016 10/07/2016 09/13/2016 09/08/2016 09/05/2016 08/31/2016 78/29/5621  Systolic BP 308 657 846 962 952 841 324  Diastolic BP 90 401 88 88 027 103 104  Wt. (Lbs) 247 249 - - 247 251 251  BMI 36.48 36.77 - - 36.48 37.07 37.07       Fatigue due to sleep pattern disturbance Pt has diagnosis of sleep apnea, plans to get CPAP this week. I have encouraged him to use every night to preserve heart, lung and brain function and improve health and wellbeing He is also to follow up with neurology  Obesity (BMI 30.0-34.9) Unchanged. Patient re-educated about  the importance of commitment to a  minimum of 150 minutes of exercise per week.  The importance of healthy food choices with portion control discussed. Encouraged to start  a food diary, count calories and to consider  joining a support group. Sample diet sheets offered. Goals set by the patient for the next several months.   Weight /BMI 10/31/2016 10/07/2016 09/05/2016  WEIGHT 247 lb 249 lb 247 lb  HEIGHT 5\' 9"  5\' 9"  5\' 9"   BMI 36.48 kg/m2 36.77 kg/m2 36.48 kg/m2      Chronic migraine Chronic c/o frequent disabling headaches, for which he has been treated by neurology for years , still no response to  treatment which allows him to function effectively. Extended work excuse until health improves  Stress and adjustment reaction Increased stress due to financial stress in part caused by poor health which prevents him from being gainfully employed. Reports "walking the street " at night, because of inability to sleep, worried about caring for his family, no interest in mental health consult , not suicidal or homicidal

## 2016-11-04 NOTE — Assessment & Plan Note (Signed)
Chronic c/o frequent disabling headaches, for which he has been treated by neurology for years , still no response to treatment which allows him to function effectively. Extended work excuse until health improves

## 2016-11-04 NOTE — Assessment & Plan Note (Addendum)
Unchanged. Patient re-educated about  the importance of commitment to a  minimum of 150 minutes of exercise per week.  The importance of healthy food choices with portion control discussed. Encouraged to start a food diary, count calories and to consider  joining a support group. Sample diet sheets offered. Goals set by the patient for the next several months.   Weight /BMI 10/31/2016 10/07/2016 09/05/2016  WEIGHT 247 lb 249 lb 247 lb  HEIGHT 5\' 9"  5\' 9"  5\' 9"   BMI 36.48 kg/m2 36.77 kg/m2 36.48 kg/m2

## 2016-11-04 NOTE — Assessment & Plan Note (Signed)
Pt has diagnosis of sleep apnea, plans to get CPAP this week. I have encouraged him to use every night to preserve heart, lung and brain function and improve health and wellbeing He is also to follow up with neurology

## 2016-11-04 NOTE — Telephone Encounter (Signed)
-----   Message from Herminio Commons, MD sent at 11/03/2016 11:41 AM EST ----- Normal pumping function, stiffness with heart relaxation, mild valve leakage. Medical management.

## 2016-11-04 NOTE — Telephone Encounter (Signed)
Left pt a message for him to return call.

## 2016-11-04 NOTE — Assessment & Plan Note (Signed)
Increased stress due to financial stress in part caused by poor health which prevents him from being gainfully employed. Reports "walking the street " at night, because of inability to sleep, worried about caring for his family, no interest in mental health consult , not suicidal or homicidal

## 2016-11-04 NOTE — Assessment & Plan Note (Addendum)
Ongoing   Uncontrolled hypertension with chronic fatigue and poor level of function Work excuse to cover prolonged period due to poor health. Needs to f/u with cardiology including getting echocardiogram Pt states he is incapable of focus and concentration due to fatigue and chronic headaches.  Still not taking all of his antihypertensive medication as prescribed, states he feels "wiped out" Plans to get CPAP machine this week which will help DASH diet and commitment to daily physical activity for a minimum of 30 minutes discussed and encouraged, as a part of hypertension management. The importance of attaining a healthy weight is also discussed.  BP/Weight 10/31/2016 10/07/2016 09/13/2016 09/08/2016 09/05/2016 08/31/2016 31/54/0086  Systolic BP 761 950 932 671 245 809 983  Diastolic BP 90 382 88 88 505 103 104  Wt. (Lbs) 247 249 - - 247 251 251  BMI 36.48 36.77 - - 36.48 37.07 37.07

## 2016-11-07 DIAGNOSIS — G4733 Obstructive sleep apnea (adult) (pediatric): Secondary | ICD-10-CM | POA: Diagnosis not present

## 2016-11-09 ENCOUNTER — Emergency Department (HOSPITAL_COMMUNITY)
Admission: EM | Admit: 2016-11-09 | Discharge: 2016-11-10 | Disposition: A | Discharge status: Home / Self Care | Payer: BLUE CROSS/BLUE SHIELD | Attending: Emergency Medicine | Admitting: Emergency Medicine

## 2016-11-09 ENCOUNTER — Encounter (HOSPITAL_COMMUNITY): Payer: Self-pay | Admitting: Emergency Medicine

## 2016-11-09 ENCOUNTER — Encounter: Payer: Self-pay | Admitting: Family Medicine

## 2016-11-09 DIAGNOSIS — R112 Nausea with vomiting, unspecified: Secondary | ICD-10-CM | POA: Diagnosis not present

## 2016-11-09 DIAGNOSIS — R519 Headache, unspecified: Secondary | ICD-10-CM

## 2016-11-09 DIAGNOSIS — N183 Chronic kidney disease, stage 3 (moderate): Secondary | ICD-10-CM | POA: Diagnosis not present

## 2016-11-09 DIAGNOSIS — R51 Headache: Secondary | ICD-10-CM | POA: Diagnosis not present

## 2016-11-09 DIAGNOSIS — Z79899 Other long term (current) drug therapy: Secondary | ICD-10-CM | POA: Insufficient documentation

## 2016-11-09 DIAGNOSIS — Z87891 Personal history of nicotine dependence: Secondary | ICD-10-CM | POA: Diagnosis not present

## 2016-11-09 DIAGNOSIS — I129 Hypertensive chronic kidney disease with stage 1 through stage 4 chronic kidney disease, or unspecified chronic kidney disease: Secondary | ICD-10-CM | POA: Diagnosis not present

## 2016-11-09 DIAGNOSIS — I10 Essential (primary) hypertension: Secondary | ICD-10-CM | POA: Diagnosis not present

## 2016-11-09 MED ORDER — DIPHENHYDRAMINE HCL 50 MG/ML IJ SOLN
25.0000 mg | Freq: Once | INTRAMUSCULAR | Status: AC
  Administered 2016-11-09: 25 mg via INTRAVENOUS
  Filled 2016-11-09: qty 1

## 2016-11-09 MED ORDER — PROMETHAZINE HCL 25 MG/ML IJ SOLN
12.5000 mg | Freq: Once | INTRAMUSCULAR | Status: AC
  Administered 2016-11-09: 12.5 mg via INTRAVENOUS
  Filled 2016-11-09: qty 1

## 2016-11-09 MED ORDER — MORPHINE SULFATE (PF) 4 MG/ML IV SOLN
4.0000 mg | Freq: Once | INTRAVENOUS | Status: AC
  Administered 2016-11-09: 4 mg via INTRAVENOUS
  Filled 2016-11-09: qty 1

## 2016-11-09 NOTE — ED Notes (Signed)
Hobson Pa at bedside,

## 2016-11-09 NOTE — ED Provider Notes (Signed)
[facility] DEPT Provider Note   CSN: [id number] Arrival date & time: 11/09/16  1856     History   Chief Complaint Chief Complaint  Patient presents with  . Headache    HPI [name] is a 46 y.o. male.  The history is provided by the patient.  Headache   This is a recurrent problem. The current episode started 6 to 12 hours ago. The problem occurs hourly. The problem has been gradually worsening. The headache is associated with bright light. The pain is located in the temporal and frontal region. The quality of the pain is described as dull. The pain is moderate. Associated symptoms include nausea and vomiting. Pertinent negatives include no fever, no near-syncope, no palpitations, no syncope and no shortness of breath. Treatments tried: blood pressure medication. The treatment provided no relief.    Past Medical History:  Diagnosis Date  . Chronic back pain   . Duodenitis with bleeding 08/2008   Ulcer - admitted to [facility]   . Essential hypertension, benign   . GERD (gastroesophageal reflux disease)   . Headache(784.0)   . Hyperlipidemia   . Migraines   . Obesity, unspecified     Patient Active Problem List   Diagnosis Date Noted  . Stress and adjustment reaction 11/04/2016  . Nausea without vomiting 09/12/2016  . Nonspecific abnormal electrocardiogram (ECG) (EKG) 08/30/2016  . Hyperlipidemia LDL goal <100 08/08/2015  . Sleep disorder 11/05/2014  . Obesity (BMI 30.0-34.9) 06/21/2014  . CKD (chronic kidney disease) stage 3, GFR 30-59 ml/min 02/23/2014  . Bilateral renal cysts 02/23/2014  . Nocturia 02/19/2014  . Fatigue due to sleep pattern disturbance 10/20/2008  . Malignant hypertension 10/09/2007  . Chronic migraine 10/09/2007    Past Surgical History:  Procedure Laterality Date  . 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   Procedure: 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.;  Surgeon: Jolyn Nap, MD;  Location: [facility];  Service: Orthopedics;  Laterality: Left;  . FINGER DEBRIDEMENT Left 09/25/2013   Thumb  . FOOT FRACTURE SURGERY         Home Medications    Prior to Admission medications   Medication Sig Start Date End Date Taking? Authorizing Provider  amLODipine (NORVASC) 10 MG tablet Take 1 tablet (10 mg total) by mouth daily. 10/14/16  Yes Fayrene Helper, MD  cloNIDine (CATAPRES) 0.3 MG tablet Take 0.5-1 tablets (0.15-0.3 mg total) by mouth 2 (two) times daily. Half tablet in the morning and one tablet in the evening. Take medication 12 hours apart 10/26/16  Yes Fayrene Helper, MD  hydrALAZINE (APRESOLINE) 25 MG tablet Take 3 tablets (75 mg total) by mouth 3 (three) times daily. 10/07/16 01/05/17 Yes Herminio Commons, MD  hydrOXYzine (VISTARIL) 50 MG capsule Take 1 capsule (50 mg total) by mouth daily. 08/17/16  Yes Fayrene Helper, MD  labetalol (NORMODYNE) 100 MG tablet Take 1 tablet (100 mg total) by mouth 2 (two) times daily. 09/06/16  Yes Fayrene Helper, MD  nitroGLYCERIN (NITROSTAT) 0.4 MG SL tablet Place 1 tablet (0.4 mg total) under the tongue every 5 (five) minutes as needed for chest pain. 07/15/14  Yes Satira Sark, MD  oxyCODONE-acetaminophen (PERCOCET) 7.5-325 MG tablet Take 1 tablet by mouth 2 (two) times daily as needed for pain. 10/14/16  Yes Historical Provider, MD    Family History Family History  Problem Relation Age of Onset  . Diabetes Mother   . Hypertension Mother   .  Hypertension Father   . Arthritis Father   . Diabetes Father     Social History Social History  Substance Use Topics  . Smoking status: Former Smoker    Packs/day: 0.10    Types: Cigarettes    Quit date: 05/28/2012  . Smokeless tobacco: Never Used  . Alcohol use No     Allergies   Bee venom; Prochlorperazine edisylate; Cyclobenzaprine hcl; Aleve [naproxen sodium]; Carbamazepine; Geodon [ziprasidone hcl]; Ibuprofen; Maxzide [hydrochlorothiazide w-triamterene]; Nsaids; Tylenol  [acetaminophen]; and Other   Review of Systems Review of Systems  Constitutional: Negative for activity change and fever.       All ROS Neg except as noted in HPI  HENT: Negative for nosebleeds.   Eyes: Negative for photophobia and discharge.  Respiratory: Negative for cough, shortness of breath and wheezing.   Cardiovascular: Negative for chest pain, palpitations, syncope and near-syncope.  Gastrointestinal: Positive for nausea and vomiting. Negative for abdominal pain and blood in stool.  Genitourinary: Negative for dysuria, frequency and hematuria.  Musculoskeletal: Negative for arthralgias, back pain and neck pain.  Skin: Negative.   Neurological: Positive for headaches. Negative for dizziness, seizures and speech difficulty.  Psychiatric/Behavioral: Negative for confusion and hallucinations.     Physical Exam Updated Vital Signs BP 149/92 (BP Location: Right Arm)   Pulse (!) 58   Temp 98.9 F (37.2 C)   Resp 20   Ht 5\' 9"  (1.753 m)   Wt 111.1 kg   SpO2 91%   BMI 36.18 kg/m   Physical Exam  Constitutional: He is oriented to person, place, and time. He appears well-developed and well-nourished.  Non-toxic appearance.  HENT:  Head: Normocephalic.  Right Ear: Tympanic membrane and external ear normal.  Left Ear: Tympanic membrane and external ear normal.  Eyes: EOM and lids are normal. Pupils are equal, round, and reactive to light.  Neck: Normal range of motion. Neck supple. Carotid bruit is not present.  Cardiovascular: Normal rate, regular rhythm, normal heart sounds, intact distal pulses and normal pulses.   Pulmonary/Chest: Breath sounds normal. No respiratory distress.  Abdominal: Soft. Bowel sounds are normal. There is no tenderness. There is no guarding.  Musculoskeletal: Normal range of motion.  Lymphadenopathy:       Head (right side): No submandibular adenopathy present.       Head (left side): No submandibular adenopathy present.    He has no cervical  adenopathy.  Neurological: He is alert and oriented to person, place, and time. He has normal strength. No cranial nerve deficit or sensory deficit. He exhibits normal muscle tone. He displays no seizure activity. Coordination normal.  Skin: Skin is warm and dry.  Psychiatric: He has a normal mood and affect. His speech is normal.  Nursing note and vitals reviewed.    ED Treatments / Results  Labs (all labs ordered are listed, but only abnormal results are displayed) Labs Reviewed - No data to display  EKG  EKG Interpretation None       Radiology No results found.  Procedures Procedures (including critical care time)  Medications Ordered in ED Medications - No data to display   Initial Impression / Assessment and Plan / ED Course  I have reviewed the triage vital signs and the nursing notes.  Pertinent labs & imaging results that were available during my care of the patient were reviewed by me and considered in my medical decision making (see chart for details).     *I have reviewed nursing notes, vital signs,  and all appropriate lab and imaging results for this patient.**  Final Clinical Impressions(s) / ED Diagnoses MDM Vital signs within normal limits. Pulse oximetry is 98% on room air. No gross neurologic deficits appreciated. Patient responded nicely to IV pain medications.  Recheck. No new neurologic deficits appreciated. No significant changes in examination. The patient will be discharged home. He has an appointment with neurology in the next few days. The patient will be treated with Vistaril and Norco on. Questions were answered. Patient is in agreement with this discharge plan.    Final diagnoses:  Nonintractable headache, unspecified chronicity pattern, unspecified headache type    New Prescriptions New Prescriptions   No medications on file     Lily Kocher, PA-C 11/10/16 0028    Lily Kocher, PA-C 11/10/16 0028    Daleen Bo,  MD 11/10/16 [phone]

## 2016-11-09 NOTE — ED Notes (Signed)
Pt c/o headache with light sensitivity, n/v and elevated blood pressure that started today, pt reports that he has hx of htn and migraines, warm blanket provided per request,

## 2016-11-09 NOTE — ED Triage Notes (Signed)
Pt c/o headache with n/v and states blood pressures at home have been high.

## 2016-11-09 NOTE — ED Notes (Signed)
Pt updated on plan of care,  

## 2016-11-10 MED ORDER — HYDROXYZINE PAMOATE 25 MG PO CAPS: 25.0000 mg | ORAL_CAPSULE | Freq: Four times a day (QID) | ORAL | 0 refills | Status: DC | PRN

## 2016-11-10 MED ORDER — HYDROCODONE-ACETAMINOPHEN 5-325 MG PO TABS: 1.0000 | ORAL_TABLET | ORAL | 0 refills | Status: DC | PRN

## 2016-11-10 NOTE — Discharge Instructions (Signed)
Your blood pressure improved after being here in the emergency department. Please continue your current medications as suggested. No new neurologic deficits or issues appreciated on tonight's exam. Please use Vistaril every 6 hours as needed for nausea, use norco every 4 hours as needed for pain. Please see your neurologist as scheduled.

## 2016-11-14 DIAGNOSIS — Z79899 Other long term (current) drug therapy: Secondary | ICD-10-CM | POA: Diagnosis not present

## 2016-11-14 DIAGNOSIS — G4733 Obstructive sleep apnea (adult) (pediatric): Secondary | ICD-10-CM | POA: Diagnosis not present

## 2016-11-14 DIAGNOSIS — G894 Chronic pain syndrome: Secondary | ICD-10-CM | POA: Diagnosis not present

## 2016-11-14 DIAGNOSIS — G4459 Other complicated headache syndrome: Secondary | ICD-10-CM | POA: Diagnosis not present

## 2016-11-18 ENCOUNTER — Encounter: Payer: Self-pay | Admitting: Cardiovascular Disease

## 2016-11-18 ENCOUNTER — Ambulatory Visit (INDEPENDENT_AMBULATORY_CARE_PROVIDER_SITE_OTHER): Payer: BLUE CROSS/BLUE SHIELD | Admitting: Cardiovascular Disease

## 2016-11-18 VITALS — BP 158/98 | HR 86 | Wt 225.0 lb

## 2016-11-18 DIAGNOSIS — R0602 Shortness of breath: Secondary | ICD-10-CM

## 2016-11-18 DIAGNOSIS — R072 Precordial pain: Secondary | ICD-10-CM | POA: Diagnosis not present

## 2016-11-18 DIAGNOSIS — N183 Chronic kidney disease, stage 3 unspecified: Secondary | ICD-10-CM

## 2016-11-18 DIAGNOSIS — I1310 Hypertensive heart and chronic kidney disease without heart failure, with stage 1 through stage 4 chronic kidney disease, or unspecified chronic kidney disease: Secondary | ICD-10-CM

## 2016-11-18 DIAGNOSIS — R9431 Abnormal electrocardiogram [ECG] [EKG]: Secondary | ICD-10-CM | POA: Diagnosis not present

## 2016-11-18 MED ORDER — CARVEDILOL 3.125 MG PO TABS: 3.1250 mg | ORAL_TABLET | Freq: Two times a day (BID) | ORAL | 6 refills | Status: DC

## 2016-11-18 NOTE — Patient Instructions (Signed)
Your physician recommends that you schedule a follow-up appointment in: 6- 8 weeks Dr Bronson Ing   START Coreg 3.125 mg twice a day    PLEASE use your CPAP device  !     Thank you for choosing [facility] !

## 2016-11-18 NOTE — Progress Notes (Signed)
SUBJECTIVE: The patient presents for follow-up of malignant hypertension and to review cardiovascular testing.  Echocardiogram 11/02/16 demonstrated normal left ventricular systolic function, LVEF 59-74%, moderate LVH, grade 2 diastolic dysfunction, and mild aortic and tricuspid regurgitation.  His chest pain has been under better control. He has not taken labetalol as it led to sluggishness and fatigue as he also takes clonidine. He is okay if he is active but if he sits down he falls asleep.  Creatinine 2.61 on 10/26/16.   Review of Systems: As per "subjective", otherwise negative.  Allergies  Allergen Reactions  . Bee Venom Anaphylaxis  . Prochlorperazine Edisylate Other (See Comments)    Reaction: Jittery,uncomfortable feeling  . Cyclobenzaprine Hcl Other (See Comments)    REACTION: feel anxious, nausea  . Aleve [Naproxen Sodium] Nausea And Vomiting    States that this medication also causes abdominal pain  . Carbamazepine     REACTION: unknown reaction  . Geodon [Ziprasidone Hcl] Nausea And Vomiting  . Ibuprofen Nausea And Vomiting    States that this medication also causes abdominal pain  . Maxzide [Hydrochlorothiazide W-Triamterene]     headache  . Nsaids Other (See Comments)    H/o severe anemia from UGIB from ulcer  . Tylenol [Acetaminophen] Nausea And Vomiting    Patient states that this medication also causes abdominal pain  . Other Nausea And Vomiting, Rash and Other (See Comments)    ALL MUSCLE RELAXANTS: states that they affect sciatic nerve, may cause nausea and vomiting, rash    Current Outpatient Prescriptions  Medication Sig Dispense Refill  . amLODipine (NORVASC) 10 MG tablet Take 1 tablet (10 mg total) by mouth daily. 30 tablet 3  . cloNIDine (CATAPRES) 0.3 MG tablet Take 0.5-1 tablets (0.15-0.3 mg total) by mouth 2 (two) times daily. Half tablet in the morning and one tablet in the evening. Take medication 12 hours apart 45 tablet 2  . hydrALAZINE  (APRESOLINE) 25 MG tablet Take 3 tablets (75 mg total) by mouth 3 (three) times daily. 270 tablet 3  . HYDROcodone-acetaminophen (NORCO/VICODIN) 5-325 MG tablet Take 1 tablet by mouth every 4 (four) hours as needed. 12 tablet 0  . hydrOXYzine (VISTARIL) 25 MG capsule Take 1 capsule (25 mg total) by mouth every 6 (six) hours as needed for nausea or vomiting. 12 capsule 0  . nitroGLYCERIN (NITROSTAT) 0.4 MG SL tablet Place 1 tablet (0.4 mg total) under the tongue every 5 (five) minutes as needed for chest pain. 25 tablet 1  . oxyCODONE-acetaminophen (PERCOCET) 7.5-325 MG tablet Take 1 tablet by mouth 2 (two) times daily as needed for pain.    Marland Kitchen labetalol (NORMODYNE) 100 MG tablet Take 1 tablet (100 mg total) by mouth 2 (two) times daily. (Patient not taking: Reported on 11/18/2016) 60 tablet 3   No current facility-administered medications for this visit.     Past Medical History:  Diagnosis Date  . Chronic back pain   . Duodenitis with bleeding 08/2008   Ulcer - [facility] to [facility]   . Essential hypertension, benign   . GERD (gastroesophageal reflux disease)   . Headache(784.0)   . Hyperlipidemia   . Migraines   . Obesity, unspecified     Past Surgical History:  Procedure Laterality Date  . 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   Procedure: 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.;  Surgeon: Jolyn Nap, MD;  Location: [facility];  Service: Orthopedics;  Laterality: Left;  . FINGER  DEBRIDEMENT Left 09/25/2013   Thumb  . FOOT FRACTURE SURGERY      Social History   Social History  . Marital status: Married    Spouse name: N/A  . Number of children: 4  . Years of education: N/A   Occupational History  . unemployed     Social History Main Topics  . Smoking status: Former Smoker    Packs/day: 0.10    Types: Cigarettes    Quit date: 05/28/2012  . Smokeless tobacco: Never Used  . Alcohol use No  . Drug use: No  . Sexual activity: Not on file    Other Topics Concern  . Not on file   Social History Narrative  . No narrative on file     Vitals:   11/18/16 1603  BP: (!) 158/98  Pulse: 86  SpO2: 95%  Weight: 225 lb (102.1 kg)    PHYSICAL EXAM General: NAD HEENT: Normal. Neck: No JVD, no thyromegaly. Lungs: Clear to auscultation bilaterally with normal respiratory effort. CV: Nondisplaced PMI.  Regular rate and rhythm, normal S1/S2, no S3/S4, no murmur. No pretibial or periankle edema.   Abdomen: Soft, nontender, no distention.  Neurologic: Alert and oriented.  Psych: Normal affect. Skin: Normal. Musculoskeletal: No gross deformities.    ECG: Most recent ECG reviewed.      ASSESSMENT AND PLAN:  1. Malignant hypertension with chest pain: BP remains markedly elevated. Currently on amlodipine 10 mg, clonidine 0.3 mg bid, and hydralazine 75 mg tid. He is not taking labetalol 100 mg bid due to sluggishness and fatigue. Will start Coreg 3.125 mg bid. Would consider renal artery Dopplers in the future to evaluate for renal artery stenosis. Sleep apnea is also contributing. He should be obtaining CPAP in the near future. Adverse effects of uncontrolled BP (CVA, MI, need for dialysis) explained at length, and thus importance of taking medications.  2. Abnormal ECG with chest pain: Low risk stress test in 06/2014. Will repeat (Lexiscan Myoview) in the future after BP is well controlled.  3. 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 Will need to control BP to avoid need for dialysis in the future. Creatinine 2.61 on 10/26/16.   Dispo: fu 6-8 weeks  Kate Sable, M.D., F.A.C.C.

## 2016-11-18 NOTE — Addendum Note (Signed)
Addended by: Barbarann Ehlers A on: 11/18/2016 04:25 PM   Modules accepted: Orders

## 2016-12-05 DIAGNOSIS — G4733 Obstructive sleep apnea (adult) (pediatric): Secondary | ICD-10-CM | POA: Diagnosis not present

## 2016-12-20 ENCOUNTER — Ambulatory Visit (INDEPENDENT_AMBULATORY_CARE_PROVIDER_SITE_OTHER): Payer: BLUE CROSS/BLUE SHIELD | Admitting: Family Medicine

## 2016-12-20 ENCOUNTER — Encounter: Payer: Self-pay | Admitting: Family Medicine

## 2016-12-20 VITALS — BP 160/100 | HR 87 | Resp 16 | Ht 69.0 in | Wt 259.0 lb

## 2016-12-20 DIAGNOSIS — R0789 Other chest pain: Secondary | ICD-10-CM

## 2016-12-20 DIAGNOSIS — R600 Localized edema: Secondary | ICD-10-CM

## 2016-12-20 DIAGNOSIS — I130 Hypertensive heart and chronic kidney disease with heart failure and stage 1 through stage 4 chronic kidney disease, or unspecified chronic kidney disease: Secondary | ICD-10-CM

## 2016-12-20 DIAGNOSIS — N183 Chronic kidney disease, stage 3 unspecified: Secondary | ICD-10-CM

## 2016-12-20 DIAGNOSIS — I5030 Unspecified diastolic (congestive) heart failure: Secondary | ICD-10-CM

## 2016-12-20 DIAGNOSIS — I10 Essential (primary) hypertension: Secondary | ICD-10-CM | POA: Diagnosis not present

## 2016-12-20 DIAGNOSIS — R0600 Dyspnea, unspecified: Secondary | ICD-10-CM

## 2016-12-20 MED ORDER — FUROSEMIDE 20 MG PO TABS: 20.0000 mg | ORAL_TABLET | Freq: Two times a day (BID) | ORAL | 1 refills | Status: DC

## 2016-12-20 NOTE — Patient Instructions (Addendum)
f/u with MD first week in May.  Repeat chem 7 and eGFR on 4/ 06/2017 in the morning, non fasting  You are referred to cardiology and nephrology for further evaluation  New for leg swelling is furosemide one twice daily Labs today bNP, chem 7 and eGFR  CXR today      DASH Eating Plan DASH stands for "Dietary Approaches to Stop Hypertension." The DASH eating plan is a healthy eating plan that has been shown to reduce high blood pressure (hypertension). It may also reduce your risk for type 2 diabetes, heart disease, and stroke. The DASH eating plan may also help with weight loss. What are tips for following this plan? General guidelines   Avoid eating more than 2,300 mg (milligrams) of salt (sodium) a day. If you have hypertension, you may need to reduce your sodium intake to 1,500 mg a day.  Limit alcohol intake to no more than 1 drink a day for nonpregnant women and 2 drinks a day for men. One drink equals 12 oz of beer, 5 oz of wine, or 1 oz of hard liquor.  Work with your health care provider to maintain a healthy body weight or to lose weight. Ask what an ideal weight is for you.  Get at least 30 minutes of exercise that causes your heart to beat faster (aerobic exercise) most days of the week. Activities may include walking, swimming, or biking.  Work with your health care provider or diet and nutrition specialist (dietitian) to adjust your eating plan to your individual calorie needs. Reading food labels   Check food labels for the amount of sodium per serving. Choose foods with less than 5 percent of the Daily Value of sodium. Generally, foods with less than 300 mg of sodium per serving fit into this eating plan.  To find whole grains, look for the word "whole" as the first word in the ingredient list. Shopping   Buy products labeled as "low-sodium" or "no salt added."  Buy fresh foods. Avoid canned foods and premade or frozen meals. Cooking   Avoid adding salt when  cooking. Use salt-free seasonings or herbs instead of table salt or sea salt. Check with your health care provider or pharmacist before using salt substitutes.  Do not fry foods. Cook foods using healthy methods such as baking, boiling, grilling, and broiling instead.  Cook with heart-healthy oils, such as olive, canola, soybean, or sunflower oil. Meal planning    Eat a balanced diet that includes:  5 or more servings of fruits and vegetables each day. At each meal, try to fill half of your plate with fruits and vegetables.  Up to 6-8 servings of whole grains each day.  Less than 6 oz of lean meat, poultry, or fish each day. A 3-oz serving of meat is about the same size as a deck of cards. One egg equals 1 oz.  2 servings of low-fat dairy each day.  A serving of nuts, seeds, or beans 5 times each week.  Heart-healthy fats. Healthy fats called Omega-3 fatty acids are found in foods such as flaxseeds and coldwater fish, like sardines, salmon, and mackerel.  Limit how much you eat of the following:  Canned or prepackaged foods.  Food that is high in trans fat, such as fried foods.  Food that is high in saturated fat, such as fatty meat.  Sweets, desserts, sugary drinks, and other foods with added sugar.  Full-fat dairy products.  Do not salt foods before eating.  Try to eat at least 2 vegetarian meals each week.  Eat more home-cooked food and less restaurant, buffet, and fast food.  When eating at a restaurant, ask that your food be prepared with less salt or no salt, if possible. What foods are recommended? The items listed may not be a complete list. Talk with your dietitian about what dietary choices are best for you. Grains  Whole-grain or whole-wheat bread. Whole-grain or whole-wheat pasta. Brown rice. [place]row. Bulgur. Whole-grain and low-sodium cereals. Pita bread. Low-fat, low-sodium crackers. Whole-wheat flour tortillas. Vegetables  Fresh or frozen  vegetables (raw, steamed, roasted, or grilled). Low-sodium or reduced-sodium tomato and vegetable juice. Low-sodium or reduced-sodium tomato sauce and tomato paste. Low-sodium or reduced-sodium canned vegetables. Fruits  All fresh, dried, or frozen fruit. Canned fruit in natural juice (without added sugar). Meat and other protein foods  Skinless chicken or [place]. Ground chicken or [place]. Pork with fat trimmed off. Fish and seafood. Egg whites. Dried beans, peas, or lentils. Unsalted nuts, nut butters, and seeds. Unsalted canned beans. Lean cuts of beef with fat trimmed off. Low-sodium, lean deli meat. Dairy  Low-fat (1%) or fat-free (skim) milk. Fat-free, low-fat, or reduced-fat cheeses. Nonfat, low-sodium ricotta or cottage cheese. Low-fat or nonfat yogurt. Low-fat, low-sodium cheese. Fats and oils  Soft margarine without trans fats. Vegetable oil. Low-fat, reduced-fat, or light mayonnaise and salad dressings (reduced-sodium). Canola, safflower, olive, soybean, and sunflower oils. Avocado. Seasoning and other foods  Herbs. Spices. Seasoning mixes without salt. Unsalted popcorn and pretzels. Fat-free sweets. What foods are not recommended? The items listed may not be a complete list. Talk with your dietitian about what dietary choices are best for you. Grains  Baked goods made with fat, such as croissants, muffins, or some breads. Dry pasta or rice meal packs. Vegetables  Creamed or fried vegetables. Vegetables in a cheese sauce. Regular canned vegetables (not low-sodium or reduced-sodium). Regular canned tomato sauce and paste (not low-sodium or reduced-sodium). Regular tomato and vegetable juice (not low-sodium or reduced-sodium). Angie Fava. Olives. Fruits  Canned fruit in a light or heavy syrup. Fried fruit. Fruit in cream or butter sauce. Meat and other protein foods  Fatty cuts of meat. Ribs. Fried meat. Berniece Salines. Sausage. Bologna and other processed lunch meats. Salami. Fatback. Hotdogs.  Bratwurst. Salted nuts and seeds. Canned beans with added salt. Canned or smoked fish. Whole eggs or egg yolks. Chicken or [place] with skin. Dairy  Whole or 2% milk, cream, and half-and-half. Whole or full-fat cream cheese. Whole-fat or sweetened yogurt. Full-fat cheese. Nondairy creamers. Whipped toppings. Processed cheese and cheese spreads. Fats and oils  Butter. Stick margarine. Lard. Shortening. Ghee. Bacon fat. Tropical oils, such as coconut, palm kernel, or palm oil. Seasoning and other foods  Salted popcorn and pretzels. Onion salt, garlic salt, seasoned salt, table salt, and sea salt. Worcestershire sauce. Tartar sauce. Barbecue sauce. Teriyaki sauce. Soy sauce, including reduced-sodium. Steak sauce. Canned and packaged gravies. Fish sauce. Oyster sauce. Cocktail sauce. Horseradish that you find on the shelf. Ketchup. Mustard. Meat flavorings and tenderizers. Bouillon cubes. Hot sauce and Tabasco sauce. Premade or packaged marinades. Premade or packaged taco seasonings. Relishes. Regular salad dressings. Where to find more information:  National Heart, Lung, and [place]: https://wilson-eaton.com/  American Heart Association: www.heart.org Summary  The DASH eating plan is a healthy eating plan that has been shown to reduce high blood pressure (hypertension). It may also reduce your risk for type 2 diabetes, heart disease, and stroke.  With the DASH  eating plan, you should limit salt (sodium) intake to 2,300 mg a day. If you have hypertension, you may need to reduce your sodium intake to 1,500 mg a day.  When on the DASH eating plan, aim to eat more fresh fruits and vegetables, whole grains, lean proteins, low-fat dairy, and heart-healthy fats.  Work with your health care provider or diet and nutrition specialist (dietitian) to adjust your eating plan to your individual calorie needs. This information is not intended to replace advice given to you by your health care provider. Make sure  you discuss any questions you have with your health care provider. Document Released: 08/25/2011 Document Revised: 08/29/2016 Document Reviewed: 08/29/2016 Elsevier Interactive Patient Education  2017 [employer].

## 2016-12-20 NOTE — Progress Notes (Signed)
   [name]     MRN: [id number]      DOB: 10/21/1970   HPI [name]  With a 1 week h/o leg swelling, increased shortness of breath with minimal activity and cough esp when lying down ROS Denies recent fever or chills. Denies sinus pressure, nasal congestion, ear pain or sore throat. Denies chest congestion, productive cough or wheezing. Denies chest pains, palpitations and leg swelling Denies abdominal pain, nausea, vomiting,diarrhea or constipation.   Denies dysuria, frequency, hesitancy or incontinence. Denies joint pain, swelling and limitation in mobility. Denies uncontrolled headaches, seizures, numbness, or tingling. Denies depression, does have  anxiety currently unable to work, and now looking at disabilityor insomnia. Denies skin break down or rash.   PE  BP (!) 160/100   Pulse 87   Resp 16   Ht 5\' 9"  (1.753 m)   Wt 259 lb (117.5 kg)   SpO2 98%   BMI 38.25 kg/m   Patient alert and oriented and in mild  cardiopulmonary distress.  HEENT: No facial asymmetry, EOMI,   oropharynx pink and moist.  Neck supple no JVD, no mass.  Chest: Clear to auscultation bilaterally.  CVS: S1, S2 no murmurs, no S3.Regular rate.  ABD: Soft non tender.   Ext: two plus pitting bilateral edema  MS: Adequate ROM spine, shoulders, hips and knees.  Skin: Intact, no ulcerations or rash noted.  Psych: Good eye contact, normal affect. Memory intact  anxious and mldly depressed appearing.  CNS: CN 2-12 intact, power,  normal throughout.no focal deficits noted.   Assessment & Plan  Malignant hypertension Uncontrolled with s/s of heart failure, needs labs , CXR and cardiology f/u  CKD (chronic kidney disease) stage 3, GFR 30-59 ml/min Refer to nephrology, has been referred in the past but does not follow through  Bilateral leg edema Start lasix with close f/u of chemistry

## 2016-12-25 ENCOUNTER — Emergency Department (HOSPITAL_COMMUNITY)
Admission: EM | Admit: 2016-12-25 | Discharge: 2016-12-25 | Disposition: A | Discharge status: Home / Self Care | Payer: BLUE CROSS/BLUE SHIELD | Attending: Emergency Medicine | Admitting: Emergency Medicine

## 2016-12-25 ENCOUNTER — Encounter (HOSPITAL_COMMUNITY): Payer: Self-pay | Admitting: *Deleted

## 2016-12-25 DIAGNOSIS — M62830 Muscle spasm of back: Secondary | ICD-10-CM | POA: Diagnosis not present

## 2016-12-25 DIAGNOSIS — X500XXA Overexertion from strenuous movement or load, initial encounter: Secondary | ICD-10-CM | POA: Insufficient documentation

## 2016-12-25 DIAGNOSIS — Y999 Unspecified external cause status: Secondary | ICD-10-CM | POA: Insufficient documentation

## 2016-12-25 DIAGNOSIS — Y929 Unspecified place or not applicable: Secondary | ICD-10-CM | POA: Insufficient documentation

## 2016-12-25 DIAGNOSIS — S3992XA Unspecified injury of lower back, initial encounter: Secondary | ICD-10-CM | POA: Diagnosis present

## 2016-12-25 DIAGNOSIS — Y93F2 Activity, caregiving, lifting: Secondary | ICD-10-CM | POA: Insufficient documentation

## 2016-12-25 DIAGNOSIS — Z87891 Personal history of nicotine dependence: Secondary | ICD-10-CM | POA: Diagnosis not present

## 2016-12-25 DIAGNOSIS — N183 Chronic kidney disease, stage 3 (moderate): Secondary | ICD-10-CM | POA: Insufficient documentation

## 2016-12-25 DIAGNOSIS — Z79899 Other long term (current) drug therapy: Secondary | ICD-10-CM | POA: Diagnosis not present

## 2016-12-25 DIAGNOSIS — I129 Hypertensive chronic kidney disease with stage 1 through stage 4 chronic kidney disease, or unspecified chronic kidney disease: Secondary | ICD-10-CM | POA: Diagnosis not present

## 2016-12-25 MED ORDER — DEXAMETHASONE 4 MG PO TABS: 4.0000 mg | ORAL_TABLET | Freq: Two times a day (BID) | ORAL | 0 refills | Status: DC

## 2016-12-25 MED ORDER — DIAZEPAM 5 MG PO TABS
10.0000 mg | ORAL_TABLET | Freq: Once | ORAL | Status: AC
  Administered 2016-12-25: 10 mg via ORAL
  Filled 2016-12-25: qty 2

## 2016-12-25 MED ORDER — DEXAMETHASONE SODIUM PHOSPHATE 4 MG/ML IJ SOLN
8.0000 mg | Freq: Once | INTRAMUSCULAR | Status: AC
  Administered 2016-12-25: 8 mg via INTRAMUSCULAR
  Filled 2016-12-25: qty 2

## 2016-12-25 MED ORDER — HYDROXYZINE HCL 50 MG/ML IM SOLN
25.0000 mg | Freq: Once | INTRAMUSCULAR | Status: AC
  Administered 2016-12-25: 25 mg via INTRAMUSCULAR
  Filled 2016-12-25: qty 1

## 2016-12-25 MED ORDER — HYDROXYZINE PAMOATE 25 MG PO CAPS: 25.0000 mg | ORAL_CAPSULE | Freq: Four times a day (QID) | ORAL | 0 refills | Status: DC | PRN

## 2016-12-25 MED ORDER — DIAZEPAM 5 MG PO TABS: 5.0000 mg | ORAL_TABLET | Freq: Three times a day (TID) | ORAL | 0 refills | Status: DC

## 2016-12-25 NOTE — ED Provider Notes (Signed)
[facility] DEPT Provider Note   CSN: [id number] Arrival date & time: 12/25/16  1948     History   Chief Complaint Chief Complaint  Patient presents with  . Back Pain    HPI [name] is a 46 y.o. male.  Patient is a 46 year old male who presents to the emergency department with a complaint of lower back pain.  The patient states that earlier today he was lifting a heavy amplifier. He now has pain from his mid back that radiates down into the buttocks. He denies loss of bowel or bladder function. He has not had any unusual falls or difficulty with using his lower extremities. No previous history of back surgeries, he does have a history of chronic back pain. He has not taken any medication up to this point for this problem.   The history is provided by the patient.  Back Pain      Past Medical History:  Diagnosis Date  . Chronic back pain   . Duodenitis with bleeding 08/2008   Ulcer - admitted to [facility]   . Essential hypertension, benign   . GERD (gastroesophageal reflux disease)   . Headache(784.0)   . Hyperlipidemia   . Migraines   . Obesity, unspecified     Patient Active Problem List   Diagnosis Date Noted  . Stress and adjustment reaction 11/04/2016  . Nausea without vomiting 09/12/2016  . Nonspecific abnormal electrocardiogram (ECG) (EKG) 08/30/2016  . Hyperlipidemia LDL goal <100 08/08/2015  . Sleep disorder 11/05/2014  . Obesity (BMI 30.0-34.9) 06/21/2014  . CKD (chronic kidney disease) stage 3, GFR 30-59 ml/min 02/23/2014  . Bilateral renal cysts 02/23/2014  . Nocturia 02/19/2014  . Fatigue due to sleep pattern disturbance 10/20/2008  . Malignant hypertension 10/09/2007  . Chronic migraine 10/09/2007    Past Surgical History:  Procedure Laterality Date  . 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   Procedure: 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.;  Surgeon: Jolyn Nap, MD;  Location: [place];  Service:  Orthopedics;  Laterality: Left;  . FINGER DEBRIDEMENT Left 09/25/2013   Thumb  . FOOT FRACTURE SURGERY         Home Medications    Prior to Admission medications   Medication Sig Start Date End Date Taking? Authorizing Provider  amLODipine (NORVASC) 10 MG tablet Take 1 tablet (10 mg total) by mouth daily. 10/14/16   Fayrene Helper, MD  carvedilol (COREG) 3.125 MG tablet Take 1 tablet (3.125 mg total) by mouth 2 (two) times daily with a meal. 11/18/16   Herminio Commons, MD  cloNIDine (CATAPRES) 0.3 MG tablet Take 0.5-1 tablets (0.15-0.3 mg total) by mouth 2 (two) times daily. Half tablet in the morning and one tablet in the evening. Take medication 12 hours apart 10/26/16   Fayrene Helper, MD  furosemide (LASIX) 20 MG tablet Take 1 tablet (20 mg total) by mouth 2 (two) times daily. 12/20/16   Fayrene Helper, MD  hydrALAZINE (APRESOLINE) 25 MG tablet Take 3 tablets (75 mg total) by mouth 3 (three) times daily. 10/07/16 01/05/17  Herminio Commons, MD  HYDROcodone-acetaminophen (NORCO/VICODIN) 5-325 MG tablet Take 1 tablet by mouth every 4 (four) hours as needed. 11/10/16   Lily Kocher, PA-C  hydrOXYzine (VISTARIL) 25 MG capsule Take 1 capsule (25 mg total) by mouth every 6 (six) hours as needed for nausea or vomiting. 11/10/16   Lily Kocher, PA-C  nitroGLYCERIN (NITROSTAT) 0.4 MG SL tablet Place 1  tablet (0.4 mg total) under the tongue every 5 (five) minutes as needed for chest pain. 07/15/14   Satira Sark, MD  oxyCODONE-acetaminophen (PERCOCET) 7.5-325 MG tablet Take 1 tablet by mouth 2 (two) times daily as needed for pain. 10/14/16   Historical Provider, MD    Family History Family History  Problem Relation Age of Onset  . Diabetes Mother   . Hypertension Mother   . Hypertension Father   . Arthritis Father   . Diabetes Father     Social History Social History  Substance Use Topics  . Smoking status: Former Smoker    Packs/day: 0.10    Types: Cigarettes    Quit  date: 05/28/2012  . Smokeless tobacco: Never Used  . Alcohol use No     Allergies   Bee venom; Prochlorperazine edisylate; Cyclobenzaprine hcl; Aleve [naproxen sodium]; Carbamazepine; Geodon [ziprasidone hcl]; Ibuprofen; Maxzide [hydrochlorothiazide w-triamterene]; Nsaids; Tylenol [acetaminophen]; and Other   Review of Systems Review of Systems  Constitutional: Positive for activity change.  Musculoskeletal: Positive for back pain.  All other systems reviewed and are negative.    Physical Exam Updated Vital Signs BP (!) 156/104   Pulse 92   Temp 99.2 F (37.3 C) (Oral)   Resp 20   Ht 5\' 9"  (1.753 m)   Wt 117.5 kg   SpO2 98%   BMI 38.25 kg/m   Physical Exam  Constitutional: He is oriented to person, place, and time. He appears well-developed and well-nourished.  Non-toxic appearance.  HENT:  Head: Normocephalic.  Right Ear: Tympanic membrane and external ear normal.  Left Ear: Tympanic membrane and external ear normal.  Eyes: EOM and lids are normal. Pupils are equal, round, and reactive to light.  Neck: Normal range of motion. Neck supple. Carotid bruit is not present.  Cardiovascular: Normal rate, regular rhythm, normal heart sounds, intact distal pulses and normal pulses.   Pulmonary/Chest: Breath sounds normal. No respiratory distress.  Abdominal: Soft. Bowel sounds are normal. There is no tenderness. There is no guarding.  Musculoskeletal:       Thoracic back: He exhibits decreased range of motion and spasm.       Lumbar back: He exhibits decreased range of motion, tenderness and spasm.       Back:  Lymphadenopathy:       Head (right side): No submandibular adenopathy present.       Head (left side): No submandibular adenopathy present.    He has no cervical adenopathy.  Neurological: He is alert and oriented to person, place, and time. He has normal strength. No cranial nerve deficit or sensory deficit.  There no sensory deficits of the lower extremity, the  inner thigh area, or the sacral area.  Skin: Skin is warm and dry.  Psychiatric: He has a normal mood and affect. His speech is normal.  Nursing note and vitals reviewed.    ED Treatments / Results  Labs (all labs ordered are listed, but only abnormal results are displayed) Labs Reviewed - No data to display  EKG  EKG Interpretation None       Radiology No results found.  Procedures Procedures (including critical care time)  Medications Ordered in ED Medications - No data to display   Initial Impression / Assessment and Plan / ED Course  I have reviewed the triage vital signs and the nursing notes.  Pertinent labs & imaging results that were available during my care of the patient were reviewed by me and considered in my  medical decision making (see chart for details).     **I have reviewed nursing notes, vital signs, and all appropriate lab and imaging results for this patient.*  Final Clinical Impressions(s) / ED Diagnoses MDM Patient was lifting amplifier equipment out of his vehicle when he strained his back and heard a pop on. There was no loss of bowel or bladder function noted. There is no evidence of cauda equina on examination. There is noted palpable spasm of the lower thoracic area, the lumbar area, and extending into the buttocks. The patient is able to ambulate, but slowly. Is no evidence of foot drop.  The patient has multiple allergies/intolerances to medications. We'll use Decadron 2 times daily, Valium 3 times daily, Vistaril for nausea. I've asked patient to use a heating pad to his lower back. I've asked him to rest his back over the next 2 or 3 days. A work note has been given. The patient is to follow-up with Dr. Moshe Cipro or member of her team in the office this week.    Final diagnoses:  Muscle spasm of back    New Prescriptions New Prescriptions   No medications on file     Lily Kocher, Hershal Coria 12/25/16 2103    Milton Ferguson, MD 12/25/16  [phone]

## 2016-12-25 NOTE — ED Notes (Addendum)
Pt denies taking anything for it, states tylenol and motrin upset his stomach.   Pt drove self here from [place] per pt.  Pt sitting on side of bed and had to stand to help with pain to back.  Pt reported that he picked up an amp and with back pain that is constant from mid to lower back.

## 2016-12-25 NOTE — ED Triage Notes (Signed)
Pt reports lifting an amplifier today and now has pain in his mid back down to his buttocks.

## 2016-12-25 NOTE — Discharge Instructions (Signed)
Please rest your back over the next 2 or 3 days. Please apply heating pad. See Dr. Moshe Cipro or member of her team for follow-up of your back this week. Use Decadron 2 times daily with food. Use Valium 3 times daily to treat the muscle strain/spasms. Use Vistaril for nausea. Valium and Vistaril may cause drowsiness, please do not drive, operate machinery, drink alcohol, or participated in activities requiring concentration when taking either of these medications.

## 2016-12-25 NOTE — ED Notes (Signed)
ED Provider at bedside. 

## 2016-12-26 DIAGNOSIS — R600 Localized edema: Secondary | ICD-10-CM | POA: Insufficient documentation

## 2016-12-26 NOTE — Assessment & Plan Note (Signed)
Uncontrolled with s/s of heart failure, needs labs , CXR and cardiology f/u

## 2016-12-26 NOTE — Assessment & Plan Note (Signed)
Refer to nephrology, has been referred in the past but does not follow through

## 2016-12-26 NOTE — Assessment & Plan Note (Signed)
Start lasix with close f/u of chemistry

## 2016-12-28 DIAGNOSIS — I130 Hypertensive heart and chronic kidney disease with heart failure and stage 1 through stage 4 chronic kidney disease, or unspecified chronic kidney disease: Secondary | ICD-10-CM | POA: Diagnosis not present

## 2016-12-28 DIAGNOSIS — I5030 Unspecified diastolic (congestive) heart failure: Secondary | ICD-10-CM | POA: Diagnosis not present

## 2016-12-28 LAB — BASIC METABOLIC PANEL WITH GFR
BUN: 18 mg/dL (ref 7–25)
CO2: 28 mmol/L (ref 20–31)
Calcium: 8.4 mg/dL — ABNORMAL LOW (ref 8.6–10.3)
Chloride: 106 mmol/L (ref 98–110)
Creat: 2.27 mg/dL — ABNORMAL HIGH (ref 0.60–1.35)
GFR, EST AFRICAN AMERICAN: 38 mL/min — AB (ref >=60)
GFR, EST NON AFRICAN AMERICAN: 33 mL/min — AB (ref >=60)
GLUCOSE: 94 mg/dL (ref 65–99)
POTASSIUM: 3.6 mmol/L (ref 3.5–5.3)
SODIUM: 142 mmol/L (ref 135–146)

## 2016-12-28 LAB — BRAIN NATRIURETIC PEPTIDE: BRAIN NATRIURETIC PEPTIDE: 6.2 pg/mL (ref <100)

## 2017-01-05 DIAGNOSIS — G4733 Obstructive sleep apnea (adult) (pediatric): Secondary | ICD-10-CM | POA: Diagnosis not present

## 2017-01-09 ENCOUNTER — Ambulatory Visit: Payer: BLUE CROSS/BLUE SHIELD | Admitting: Cardiovascular Disease

## 2017-01-10 DIAGNOSIS — Z79899 Other long term (current) drug therapy: Secondary | ICD-10-CM | POA: Diagnosis not present

## 2017-01-10 DIAGNOSIS — G4459 Other complicated headache syndrome: Secondary | ICD-10-CM | POA: Diagnosis not present

## 2017-01-10 DIAGNOSIS — I10 Essential (primary) hypertension: Secondary | ICD-10-CM | POA: Diagnosis not present

## 2017-01-10 DIAGNOSIS — G4733 Obstructive sleep apnea (adult) (pediatric): Secondary | ICD-10-CM | POA: Diagnosis not present

## 2017-01-11 ENCOUNTER — Ambulatory Visit (INDEPENDENT_AMBULATORY_CARE_PROVIDER_SITE_OTHER): Payer: BLUE CROSS/BLUE SHIELD | Admitting: Cardiovascular Disease

## 2017-01-11 ENCOUNTER — Encounter: Payer: Self-pay | Admitting: Cardiovascular Disease

## 2017-01-11 VITALS — BP 142/88 | HR 80 | Ht 71.0 in | Wt 259.0 lb

## 2017-01-11 DIAGNOSIS — I110 Hypertensive heart disease with heart failure: Secondary | ICD-10-CM | POA: Diagnosis not present

## 2017-01-11 DIAGNOSIS — N183 Chronic kidney disease, stage 3 unspecified: Secondary | ICD-10-CM

## 2017-01-11 DIAGNOSIS — I5033 Acute on chronic diastolic (congestive) heart failure: Secondary | ICD-10-CM | POA: Diagnosis not present

## 2017-01-11 DIAGNOSIS — R072 Precordial pain: Secondary | ICD-10-CM

## 2017-01-11 DIAGNOSIS — R9431 Abnormal electrocardiogram [ECG] [EKG]: Secondary | ICD-10-CM

## 2017-01-11 DIAGNOSIS — R600 Localized edema: Secondary | ICD-10-CM | POA: Diagnosis not present

## 2017-01-11 DIAGNOSIS — I10 Essential (primary) hypertension: Secondary | ICD-10-CM

## 2017-01-11 NOTE — Progress Notes (Signed)
SUBJECTIVE: The patient presents for follow-up of malignant hypertension and hypertensive heart disease. He has not had any significant chest pain. He just started walking and working out. He has had some dizziness but denies syncope. He thinks his blood pressure was 165/95 at his neurologist's office yesterday. He sees him for sleep apnea and headaches.  His PCP started him on Lasix 20 mg twice daily for lower extremity edema.  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   Review of Systems: As per "subjective", otherwise negative.  Allergies  Allergen Reactions  . Bee Venom Anaphylaxis  . Prochlorperazine Edisylate Other (See Comments)    Reaction: Jittery,uncomfortable feeling  . Cyclobenzaprine Hcl Other (See Comments)    REACTION: feel anxious, nausea  . Aleve [Naproxen Sodium] Nausea And Vomiting    States that this medication also causes abdominal pain  . Carbamazepine     REACTION: unknown reaction  . Geodon [Ziprasidone Hcl] Nausea And Vomiting  . Ibuprofen Nausea And Vomiting    States that this medication also causes abdominal pain  . Maxzide [Hydrochlorothiazide W-Triamterene]     headache  . Nsaids Other (See Comments)    H/o severe anemia from UGIB from ulcer  . Tylenol [Acetaminophen] Nausea And Vomiting    Patient states that this medication also causes abdominal pain  . Other Nausea And Vomiting, Rash and Other (See Comments)    ALL MUSCLE RELAXANTS: states that they affect sciatic nerve, may cause nausea and vomiting, rash    Current Outpatient Prescriptions  Medication Sig Dispense Refill  . amLODipine (NORVASC) 10 MG tablet Take 1 tablet (10 mg total) by mouth daily. 30 tablet 3  . carvedilol (COREG) 3.125 MG tablet Take 1 tablet (3.125 mg total) by mouth 2 (two) times daily with a meal. 60 tablet 6  . cloNIDine (CATAPRES) 0.3 MG tablet Take 0.5-1 tablets (0.15-0.3 mg total) by mouth 2 (two) times daily. Half  tablet in the morning and one tablet in the evening. Take medication 12 hours apart 45 tablet 2  . furosemide (LASIX) 20 MG tablet Take 1 tablet (20 mg total) by mouth 2 (two) times daily. 60 tablet 1  . hydrALAZINE (APRESOLINE) 25 MG tablet Take 3 tablets (75 mg total) by mouth 3 (three) times daily. 270 tablet 3  . hydrOXYzine (VISTARIL) 25 MG capsule Take 1 capsule (25 mg total) by mouth every 6 (six) hours as needed for nausea. 12 capsule 0  . nitroGLYCERIN (NITROSTAT) 0.4 MG SL tablet Place 1 tablet (0.4 mg total) under the tongue every 5 (five) minutes as needed for chest pain. 25 tablet 1  . oxyCODONE-acetaminophen (PERCOCET) 7.5-325 MG tablet Take 1 tablet by mouth 2 (two) times daily as needed for pain.     No current facility-administered medications for this visit.     Past Medical History:  Diagnosis Date  . Chronic back pain   . Duodenitis with bleeding 08/2008   Ulcer - admitted to [facility]   . Essential hypertension, benign   . GERD (gastroesophageal reflux disease)   . Headache(784.0)   . Hyperlipidemia   . Migraines   . Obesity, unspecified     Past Surgical History:  Procedure Laterality Date  . 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  FINGER DEBRIDEMENT Left 09/25/2013   Thumb  . FOOT FRACTURE SURGERY      Social History   Social History  . Marital status: Married    Spouse name: N/A  . Number of children: 4  . Years of education: N/A   Occupational History  . unemployed     Social History Main Topics  . Smoking status: Former Smoker    Packs/day: 0.10    Types: Cigarettes    Quit date: 05/28/2012  . Smokeless tobacco: Never Used  . Alcohol use No  . Drug use: No  . Sexual activity: Not on file   Other Topics Concern  . Not on file   Social History Narrative  . No  narrative on file     Vitals:   01/11/17 1012  BP: (!) 142/88  Pulse: 80  SpO2: 95%  Weight: 259 lb (117.5 kg)  Height: 5\' 11"  (1.803 m)    Wt Readings from Last 3 Encounters:  01/11/17 259 lb (117.5 kg)  12/25/16 259 lb (117.5 kg)  12/20/16 259 lb (117.5 kg)     PHYSICAL EXAM General: NAD HEENT: Normal. Neck: No JVD, no thyromegaly. Lungs: Clear to auscultation bilaterally with normal respiratory effort. CV: Nondisplaced PMI.  Regular rate and rhythm, normal S1/S2, no S3/S4, no murmur. 1+ pitting pretibial b/l edema.    Abdomen: Soft, nontender, no distention.  Neurologic: Alert and oriented.  Psych: Normal affect. Skin: Normal. Musculoskeletal: No gross deformities.    ECG: Most recent ECG reviewed.   Labs: Lab Results  Component Value Date/Time   K 3.6 12/28/2016 08:35 AM   BUN 18 12/28/2016 08:35 AM   CREATININE 2.27 (H) 12/28/2016 08:35 AM   ALT 10 (L) 10/13/2015 09:54 AM   TSH 0.93 10/26/2016 11:23 AM   HGB 12.6 (L) 10/26/2016 11:23 AM     Lipids: Lab Results  Component Value Date/Time   LDLCALC 54 09/22/2015 08:14 AM   CHOL 103 (L) 09/22/2015 08:14 AM   TRIG 63 09/22/2015 08:14 AM   HDL 36 (L) 09/22/2015 08:14 AM       ASSESSMENT AND PLAN: 1. Malignant hypertension with chest pain: Blood pressure is suboptimally but better controlled with the addition of carvedilol. I will continue amlodipine 10 mg, clonidine, and hydralazine 75 mg 3 times daily. I have asked the patient to check blood pressure readings 3-4 times per week, at different times throughout the day, in order to get a better approximation of mean BP values. These results will be provided to me at the end of that period so that I can determine if antihypertensive medication titration is indicated.  I would consider renal artery Dopplers in the future to evaluate for renal artery stenosis. He has sleep apnea. Adverse effects of uncontrolled BP (CVA, MI, need for dialysis) previously  explained at length, and thus importance of taking medications.  2. Abnormal ECG with chest pain: Low risk stress test in October 2015. I will repeat nuclear stress testing after blood pressure remains consistently controlled.  3. 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 Will need to control blood pressure to avoid the need for dialysis in the future. Creatinine 2.27 on 12/28/16. Now on Lasix.  4. Hypertensive heart disease: Has lower extremity edema and grade 2 diastolic dysfunction. Takes Lasix 20 mg bid.   Disposition: Follow up 3 months  Time spent: 40 minutes, of which greater than 50% was spent reviewing symptoms, relevant blood tests and studies, and discussing management plan with  the patient.   Kate Sable, M.D., F.A.C.C.

## 2017-01-11 NOTE — Patient Instructions (Signed)
Medication Instructions:  Your physician recommends that you continue on your current medications as directed. Please refer to the Current Medication list given to you today.   Labwork: NONE  Testing/Procedures: NONE  Follow-Up: Your physician recommends that you schedule a follow-up appointment in: 3 MONTHS    Any Other Special Instructions Will Be Listed Below (If Applicable). KEEP A BLOOD PRESSURE LOG- TAKE BLOOD PRESSURE 3 TIMES WEEKLY. Please drop it off after 1 month.     If you need a refill on your cardiac medications before your next appointment, please call your pharmacy.

## 2017-01-16 DIAGNOSIS — G4733 Obstructive sleep apnea (adult) (pediatric): Secondary | ICD-10-CM | POA: Diagnosis not present

## 2017-01-17 ENCOUNTER — Ambulatory Visit: Payer: BLUE CROSS/BLUE SHIELD | Admitting: Family Medicine

## 2017-01-17 ENCOUNTER — Encounter: Payer: Self-pay | Admitting: Family Medicine

## 2017-01-25 ENCOUNTER — Other Ambulatory Visit: Payer: Self-pay | Admitting: Family Medicine

## 2017-01-26 ENCOUNTER — Other Ambulatory Visit: Payer: Self-pay

## 2017-01-26 MED ORDER — CLONIDINE HCL 0.3 MG PO TABS: 0.1500 mg | ORAL_TABLET | Freq: Two times a day (BID) | ORAL | 1 refills | Status: DC

## 2017-02-01 ENCOUNTER — Ambulatory Visit: Payer: BLUE CROSS/BLUE SHIELD | Admitting: Family Medicine

## 2017-02-04 DIAGNOSIS — G4733 Obstructive sleep apnea (adult) (pediatric): Secondary | ICD-10-CM | POA: Diagnosis not present

## 2017-02-23 ENCOUNTER — Other Ambulatory Visit: Payer: Self-pay | Admitting: Family Medicine

## 2017-02-23 ENCOUNTER — Telehealth: Payer: Self-pay | Admitting: Family Medicine

## 2017-02-23 DIAGNOSIS — M549 Dorsalgia, unspecified: Secondary | ICD-10-CM

## 2017-02-23 NOTE — Telephone Encounter (Signed)
Will refer to orhto of his choice , I have put in generic referral just call the office he prefers or can get into soonest please,  Actually with his private ins we do not technically have to refer but if we calll can get a "sooner appt " which I guess is why he is calling so I suggest just getting an appt at soonest available facility, which may not necessarily be his first choice is the best you can do    ?? pls ask!

## 2017-02-23 NOTE — Telephone Encounter (Signed)
Patient calling because he would like an appt for his back.  He has been doing lifting lately and the last 3 days it has gotten worse.  He was in the ER about a month ago with the same problem and was given muscle relaxer, but he states he is allergic to them.  With the schedule being so tight, I can see about getting him to an orthopedist.  He states he has seen Dr Sharol Given in [place].  Please advise.

## 2017-02-28 ENCOUNTER — Ambulatory Visit: Payer: BLUE CROSS/BLUE SHIELD | Admitting: Family Medicine

## 2017-03-07 DIAGNOSIS — G4733 Obstructive sleep apnea (adult) (pediatric): Secondary | ICD-10-CM | POA: Diagnosis not present

## 2017-03-16 ENCOUNTER — Ambulatory Visit (INDEPENDENT_AMBULATORY_CARE_PROVIDER_SITE_OTHER): Payer: Self-pay | Admitting: Orthopedic Surgery

## 2017-04-05 DIAGNOSIS — F419 Anxiety disorder, unspecified: Secondary | ICD-10-CM | POA: Diagnosis not present

## 2017-04-05 DIAGNOSIS — Z79891 Long term (current) use of opiate analgesic: Secondary | ICD-10-CM | POA: Diagnosis not present

## 2017-04-05 DIAGNOSIS — I10 Essential (primary) hypertension: Secondary | ICD-10-CM | POA: Diagnosis not present

## 2017-04-05 DIAGNOSIS — G43719 Chronic migraine without aura, intractable, without status migrainosus: Secondary | ICD-10-CM | POA: Diagnosis not present

## 2017-04-06 DIAGNOSIS — G4733 Obstructive sleep apnea (adult) (pediatric): Secondary | ICD-10-CM | POA: Diagnosis not present

## 2017-04-14 ENCOUNTER — Ambulatory Visit (INDEPENDENT_AMBULATORY_CARE_PROVIDER_SITE_OTHER): Payer: Self-pay | Admitting: Orthopedic Surgery

## 2017-04-19 ENCOUNTER — Ambulatory Visit: Payer: BLUE CROSS/BLUE SHIELD | Admitting: Cardiovascular Disease

## 2017-04-19 ENCOUNTER — Encounter: Payer: Self-pay | Admitting: Cardiovascular Disease

## 2017-04-20 ENCOUNTER — Telehealth: Payer: Self-pay | Admitting: Family Medicine

## 2017-04-20 NOTE — Telephone Encounter (Signed)
FYI:  Patient canceled on APPT WITH DR. DUDA (REFERRAL)  6/28 and no showed appt on 7/27 . Will wait for pt to call back if they want to reschedule.

## 2017-04-20 NOTE — Telephone Encounter (Signed)
noted 

## 2017-05-03 DIAGNOSIS — G4733 Obstructive sleep apnea (adult) (pediatric): Secondary | ICD-10-CM | POA: Diagnosis not present

## 2017-05-05 DIAGNOSIS — G4733 Obstructive sleep apnea (adult) (pediatric): Secondary | ICD-10-CM | POA: Diagnosis not present

## 2017-05-07 DIAGNOSIS — G4733 Obstructive sleep apnea (adult) (pediatric): Secondary | ICD-10-CM | POA: Diagnosis not present

## 2017-05-21 ENCOUNTER — Inpatient Hospital Stay (HOSPITAL_COMMUNITY)
Admission: EM | Admit: 2017-05-21 | Discharge: 2017-05-26 | DRG: 305 | Disposition: A | Discharge status: Home / Self Care | Payer: BLUE CROSS/BLUE SHIELD | Attending: Internal Medicine | Admitting: Internal Medicine

## 2017-05-21 ENCOUNTER — Emergency Department (HOSPITAL_COMMUNITY): Payer: BLUE CROSS/BLUE SHIELD

## 2017-05-21 ENCOUNTER — Encounter (HOSPITAL_COMMUNITY): Payer: Self-pay | Admitting: *Deleted

## 2017-05-21 DIAGNOSIS — I1310 Hypertensive heart and chronic kidney disease without heart failure, with stage 1 through stage 4 chronic kidney disease, or unspecified chronic kidney disease: Secondary | ICD-10-CM | POA: Diagnosis present

## 2017-05-21 DIAGNOSIS — Z89012 Acquired absence of left thumb: Secondary | ICD-10-CM | POA: Diagnosis not present

## 2017-05-21 DIAGNOSIS — E669 Obesity, unspecified: Secondary | ICD-10-CM | POA: Diagnosis present

## 2017-05-21 DIAGNOSIS — N189 Chronic kidney disease, unspecified: Secondary | ICD-10-CM | POA: Diagnosis not present

## 2017-05-21 DIAGNOSIS — I248 Other forms of acute ischemic heart disease: Secondary | ICD-10-CM | POA: Diagnosis present

## 2017-05-21 DIAGNOSIS — Z6834 Body mass index (BMI) 34.0-34.9, adult: Secondary | ICD-10-CM | POA: Diagnosis not present

## 2017-05-21 DIAGNOSIS — R7989 Other specified abnormal findings of blood chemistry: Secondary | ICD-10-CM

## 2017-05-21 DIAGNOSIS — G479 Sleep disorder, unspecified: Secondary | ICD-10-CM | POA: Diagnosis present

## 2017-05-21 DIAGNOSIS — Z91030 Bee allergy status: Secondary | ICD-10-CM

## 2017-05-21 DIAGNOSIS — G4733 Obstructive sleep apnea (adult) (pediatric): Secondary | ICD-10-CM | POA: Diagnosis present

## 2017-05-21 DIAGNOSIS — N184 Chronic kidney disease, stage 4 (severe): Secondary | ICD-10-CM | POA: Diagnosis not present

## 2017-05-21 DIAGNOSIS — I160 Hypertensive urgency: Secondary | ICD-10-CM | POA: Diagnosis not present

## 2017-05-21 DIAGNOSIS — R9431 Abnormal electrocardiogram [ECG] [EKG]: Secondary | ICD-10-CM | POA: Diagnosis present

## 2017-05-21 DIAGNOSIS — M899 Disorder of bone, unspecified: Secondary | ICD-10-CM | POA: Diagnosis not present

## 2017-05-21 DIAGNOSIS — E876 Hypokalemia: Secondary | ICD-10-CM | POA: Diagnosis present

## 2017-05-21 DIAGNOSIS — D649 Anemia, unspecified: Secondary | ICD-10-CM | POA: Diagnosis not present

## 2017-05-21 DIAGNOSIS — I43 Cardiomyopathy in diseases classified elsewhere: Secondary | ICD-10-CM | POA: Diagnosis not present

## 2017-05-21 DIAGNOSIS — R51 Headache: Secondary | ICD-10-CM | POA: Diagnosis not present

## 2017-05-21 DIAGNOSIS — N183 Chronic kidney disease, stage 3 (moderate): Secondary | ICD-10-CM | POA: Diagnosis not present

## 2017-05-21 DIAGNOSIS — G43909 Migraine, unspecified, not intractable, without status migrainosus: Secondary | ICD-10-CM | POA: Diagnosis present

## 2017-05-21 DIAGNOSIS — I119 Hypertensive heart disease without heart failure: Secondary | ICD-10-CM | POA: Diagnosis present

## 2017-05-21 DIAGNOSIS — Z886 Allergy status to analgesic agent status: Secondary | ICD-10-CM

## 2017-05-21 DIAGNOSIS — I161 Hypertensive emergency: Principal | ICD-10-CM | POA: Diagnosis present

## 2017-05-21 DIAGNOSIS — Z888 Allergy status to other drugs, medicaments and biological substances status: Secondary | ICD-10-CM | POA: Diagnosis not present

## 2017-05-21 DIAGNOSIS — I110 Hypertensive heart disease with heart failure: Secondary | ICD-10-CM | POA: Diagnosis not present

## 2017-05-21 DIAGNOSIS — N179 Acute kidney failure, unspecified: Secondary | ICD-10-CM | POA: Diagnosis not present

## 2017-05-21 DIAGNOSIS — G43109 Migraine with aura, not intractable, without status migrainosus: Secondary | ICD-10-CM | POA: Diagnosis not present

## 2017-05-21 DIAGNOSIS — R778 Other specified abnormalities of plasma proteins: Secondary | ICD-10-CM | POA: Diagnosis present

## 2017-05-21 DIAGNOSIS — I351 Nonrheumatic aortic (valve) insufficiency: Secondary | ICD-10-CM | POA: Diagnosis not present

## 2017-05-21 DIAGNOSIS — R748 Abnormal levels of other serum enzymes: Secondary | ICD-10-CM | POA: Diagnosis not present

## 2017-05-21 DIAGNOSIS — I129 Hypertensive chronic kidney disease with stage 1 through stage 4 chronic kidney disease, or unspecified chronic kidney disease: Secondary | ICD-10-CM | POA: Diagnosis not present

## 2017-05-21 LAB — TSH: TSH: 0.626 u[IU]/mL (ref 0.350–4.500)

## 2017-05-21 LAB — BASIC METABOLIC PANEL
ANION GAP: 12 (ref 5–15)
BUN: 25 mg/dL — ABNORMAL HIGH (ref 6–20)
CHLORIDE: 97 mmol/L — AB (ref 101–111)
CO2: 31 mmol/L (ref 22–32)
Calcium: 9.3 mg/dL (ref 8.9–10.3)
Creatinine, Ser: 2.86 mg/dL — ABNORMAL HIGH (ref 0.61–1.24)
GFR calc non Af Amer: 24 mL/min — ABNORMAL LOW (ref >60)
GFR, EST AFRICAN AMERICAN: 28 mL/min — AB (ref >60)
Glucose, Bld: 92 mg/dL (ref 65–99)
POTASSIUM: 3.6 mmol/L (ref 3.5–5.1)
Sodium: 140 mmol/L (ref 135–145)

## 2017-05-21 LAB — TROPONIN I
TROPONIN I: 0.17 ng/mL — AB (ref <0.03)
TROPONIN I: 0.14 ng/mL — AB (ref <0.03)

## 2017-05-21 LAB — CBC
HCT: 36.4 % — ABNORMAL LOW (ref 39.0–52.0)
HEMOGLOBIN: 12.8 g/dL — AB (ref 13.0–17.0)
MCH: 32.3 pg (ref 26.0–34.0)
MCHC: 35.2 g/dL (ref 30.0–36.0)
MCV: 91.9 fL (ref 78.0–100.0)
Platelets: 280 10*3/uL (ref 150–400)
RBC: 3.96 MIL/uL — AB (ref 4.22–5.81)
RDW: 11.6 % (ref 11.5–15.5)
WBC: 6.7 10*3/uL (ref 4.0–10.5)

## 2017-05-21 LAB — MRSA PCR SCREENING: MRSA BY PCR: NEGATIVE

## 2017-05-21 LAB — I-STAT TROPONIN, ED: TROPONIN I, POC: 0.07 ng/mL (ref 0.00–0.08)

## 2017-05-21 MED ORDER — MORPHINE SULFATE (PF) 4 MG/ML IV SOLN
4.0000 mg | Freq: Once | INTRAVENOUS | Status: AC
  Administered 2017-05-21: 4 mg via INTRAVENOUS
  Filled 2017-05-21: qty 1

## 2017-05-21 MED ORDER — METOCLOPRAMIDE HCL 5 MG/ML IJ SOLN
10.0000 mg | Freq: Once | INTRAMUSCULAR | Status: AC
  Administered 2017-05-21: 10 mg via INTRAVENOUS
  Filled 2017-05-21: qty 2

## 2017-05-21 MED ORDER — DIPHENHYDRAMINE HCL 50 MG/ML IJ SOLN
25.0000 mg | Freq: Once | INTRAMUSCULAR | Status: AC
  Administered 2017-05-21: 25 mg via INTRAVENOUS
  Filled 2017-05-21: qty 1

## 2017-05-21 MED ORDER — CARVEDILOL 3.125 MG PO TABS
3.1250 mg | ORAL_TABLET | Freq: Two times a day (BID) | ORAL | Status: DC
  Administered 2017-05-21: 3.125 mg via ORAL
  Filled 2017-05-21 (×2): qty 1

## 2017-05-21 MED ORDER — ACETAMINOPHEN 325 MG PO TABS: 650.0000 mg | ORAL_TABLET | Freq: Four times a day (QID) | ORAL | Status: DC | PRN

## 2017-05-21 MED ORDER — ATORVASTATIN CALCIUM 40 MG PO TABS
40.0000 mg | ORAL_TABLET | Freq: Every day | ORAL | Status: DC
  Administered 2017-05-21 – 2017-05-25 (×5): 40 mg via ORAL
  Filled 2017-05-21 (×5): qty 1

## 2017-05-21 MED ORDER — AMLODIPINE BESYLATE 5 MG PO TABS
10.0000 mg | ORAL_TABLET | Freq: Every day | ORAL | Status: DC
  Administered 2017-05-21 – 2017-05-26 (×6): 10 mg via ORAL
  Filled 2017-05-21 (×6): qty 2

## 2017-05-21 MED ORDER — LABETALOL HCL 5 MG/ML IV SOLN
20.0000 mg | Freq: Once | INTRAVENOUS | Status: AC
  Administered 2017-05-21: 20 mg via INTRAVENOUS
  Filled 2017-05-21: qty 4

## 2017-05-21 MED ORDER — ONDANSETRON 4 MG PO TBDP
ORAL_TABLET | ORAL | Status: AC
  Filled 2017-05-21: qty 1

## 2017-05-21 MED ORDER — ACETAMINOPHEN 650 MG RE SUPP: 650.0000 mg | Freq: Four times a day (QID) | RECTAL | Status: DC | PRN

## 2017-05-21 MED ORDER — NICARDIPINE HCL IN NACL 20-0.86 MG/200ML-% IV SOLN
3.0000 mg/h | INTRAVENOUS | Status: DC
  Administered 2017-05-21 – 2017-05-22 (×4): 5 mg/h via INTRAVENOUS
  Administered 2017-05-22 – 2017-05-23 (×3): 3 mg/h via INTRAVENOUS
  Administered 2017-05-23: 2 mg/h via INTRAVENOUS
  Filled 2017-05-21: qty 400
  Filled 2017-05-21 (×6): qty 200

## 2017-05-21 MED ORDER — MORPHINE SULFATE (PF) 4 MG/ML IV SOLN
4.0000 mg | Freq: Once | INTRAVENOUS | Status: AC
Start: 2017-05-21 — End: 2017-05-21
  Administered 2017-05-21: 4 mg via INTRAVENOUS
  Filled 2017-05-21: qty 1

## 2017-05-21 MED ORDER — LABETALOL HCL 5 MG/ML IV SOLN
20.0000 mg | Freq: Once | INTRAVENOUS | Status: AC
Start: 2017-05-21 — End: 2017-05-21
  Administered 2017-05-21: 20 mg via INTRAVENOUS
  Filled 2017-05-21: qty 4

## 2017-05-21 MED ORDER — NITROGLYCERIN IN D5W 200-5 MCG/ML-% IV SOLN
0.0000 ug/min | INTRAVENOUS | Status: DC
  Administered 2017-05-21: 1.5 ug/min via INTRAVENOUS

## 2017-05-21 MED ORDER — NITROGLYCERIN IN D5W 200-5 MCG/ML-% IV SOLN
INTRAVENOUS | Status: AC
  Filled 2017-05-21: qty 250

## 2017-05-21 MED ORDER — CLONIDINE HCL 0.2 MG PO TABS
0.3000 mg | ORAL_TABLET | Freq: Every day | ORAL | Status: DC
  Administered 2017-05-21 – 2017-05-25 (×5): 0.3 mg via ORAL
  Filled 2017-05-21 (×5): qty 1

## 2017-05-21 MED ORDER — CLONIDINE HCL 0.1 MG PO TABS: 0.1500 mg | ORAL_TABLET | Freq: Two times a day (BID) | ORAL | Status: DC

## 2017-05-21 MED ORDER — HEPARIN SODIUM (PORCINE) 5000 UNIT/ML IJ SOLN
5000.0000 [IU] | Freq: Three times a day (TID) | INTRAMUSCULAR | Status: DC
  Administered 2017-05-21 – 2017-05-26 (×8): 5000 [IU] via SUBCUTANEOUS
  Filled 2017-05-21 (×10): qty 1

## 2017-05-21 MED ORDER — SODIUM CHLORIDE 0.9 % IV SOLN
INTRAVENOUS | Status: DC
  Administered 2017-05-21: 12:00:00 via INTRAVENOUS

## 2017-05-21 MED ORDER — MORPHINE SULFATE (PF) 4 MG/ML IV SOLN
4.0000 mg | INTRAVENOUS | Status: DC | PRN
  Administered 2017-05-21 – 2017-05-22 (×2): 4 mg via INTRAVENOUS
  Filled 2017-05-21 (×3): qty 1

## 2017-05-21 MED ORDER — ONDANSETRON 4 MG PO TBDP
4.0000 mg | ORAL_TABLET | Freq: Once | ORAL | Status: AC
  Administered 2017-05-21: 4 mg via ORAL

## 2017-05-21 MED ORDER — LABETALOL HCL 5 MG/ML IV SOLN
0.5000 mg/min | INTRAVENOUS | Status: DC
  Administered 2017-05-21: 0.5 mg/min via INTRAVENOUS
  Filled 2017-05-21: qty 80

## 2017-05-21 MED ORDER — LABETALOL HCL 5 MG/ML IV SOLN
10.0000 mg | INTRAVENOUS | Status: DC | PRN
  Administered 2017-05-21 (×2): 10 mg via INTRAVENOUS
  Filled 2017-05-21 (×2): qty 4

## 2017-05-21 MED ORDER — CLONIDINE HCL 0.1 MG PO TABS
0.1500 mg | ORAL_TABLET | Freq: Every day | ORAL | Status: DC
  Administered 2017-05-22 – 2017-05-26 (×5): 0.15 mg via ORAL
  Filled 2017-05-21 (×6): qty 2

## 2017-05-21 MED ORDER — HYDRALAZINE HCL 25 MG PO TABS
75.0000 mg | ORAL_TABLET | Freq: Three times a day (TID) | ORAL | Status: DC
  Administered 2017-05-21 – 2017-05-22 (×5): 75 mg via ORAL
  Filled 2017-05-21 (×5): qty 3

## 2017-05-21 NOTE — Progress Notes (Signed)
eLink Physician-Brief Progress Note Patient Name: [name] DOB: [birth year] MRN: [id number]   Date of Service  05/21/2017  HPI/Events of Note  Notified by bedside nurse. She has attempted to contact the primary team. As yet no response. Currently on nitroglycerin infusion with systolic blood pressure 340. Admission H&P reviewed indicating goal systolic blood pressure [phone]. Patient with severe headache likely secondary to nitroglycerin infusion. Currently has no chest pain.   eICU Interventions  1. Initiating nicardipine infusion with goal systolic blood pressure [id number]. 2. Care order instruction for titration off nitroglycerin once nicardipine infusion is initiated 3. Further orders as per primary service once they respond      Intervention Category Major Interventions: Hypertension - evaluation and management  Tera Partridge 05/21/2017, 8:58 PM

## 2017-05-21 NOTE — H&P (Signed)
History and Physical    [name] VFI:[id number] DOB: 11-05-1970 DOA: 05/21/2017  PCP: Fayrene Helper, MD  Patient coming from: Home.    Chief Complaint:   Headache.    HPI: [name] is an 47 y.o. male with hx of classical migraine, severe HTN, chronic low back pain, CKD 3,  Hx of abnormal EKG with chest pain, last negative stress test in Oct 2015, with plan for nuclear stress test once BP controlled per Dr Dyke Brackett, hx of sleep apnea, presented to the ER with nausea, photophobia, HA, similar to his usual migraine, except it is more severe.  Evaluation in the ER showed SBP 224!!!  and DBP over 110, with troponin elevated to 0.17, and EKG showed new deeply inverted T waves over the precordial leads.  He was started on IV Labetelol drip, and hospitalist was asked to admit him for HTN urgency.  I saw the EKG, and asked Dr Tomi Bamberger to consult cardiology. He spoke with Dr Rayann Heman, and he felt that patient can be admitted here with medical Tx at [facility].     ED Course:  See above.  Rewiew of Systems:  Constitutional: Negative for malaise, fever and chills. No significant weight loss or weight gain Eyes: Negative for eye pain, redness and discharge, diplopia, visual changes, or flashes of light. ENMT: Negative for ear pain, hoarseness, nasal congestion, sinus pressure and sore throat. No headaches; tinnitus, drooling, or problem swallowing. Cardiovascular: Negative for chest pain, palpitations, diaphoresis, dyspnea and peripheral edema. ; No orthopnea, PND Respiratory: Negative for cough, hemoptysis, wheezing and stridor. No pleuritic chestpain. Gastrointestinal: Negative for diarrhea, constipation,  melena, blood in stool, hematemesis, jaundice and rectal bleeding.    Genitourinary: Negative for frequency, dysuria, incontinence,flank pain and hematuria; Musculoskeletal: Negative for back pain and neck pain. Negative for swelling and trauma.;  Skin: . Negative for pruritus, rash,  abrasions, bruising and skin lesion.; ulcerations Neuro: Negative for ightheadedness and neck stiffness. Negative for weakness, altered level of consciousness , altered mental status, extremity weakness, burning feet, involuntary movement, seizure and syncope.  Psych: negative for anxiety, depression, insomnia, tearfulness, panic attacks, hallucinations, paranoia, suicidal or homicidal ideation    Past Medical History:  Diagnosis Date  . Chronic back pain   . Duodenitis with bleeding 08/2008   Ulcer - admitted to [facility]   . Essential hypertension, benign   . GERD (gastroesophageal reflux disease)   . Headache(784.0)   . Hyperlipidemia   . Migraines   . Obesity, unspecified      Past Surgical History:  Procedure Laterality Date  . 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   Procedure: 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.;  Surgeon: Jolyn Nap, MD;  Location: [place];  Service: Orthopedics;  Laterality: Left;  . FINGER DEBRIDEMENT Left 09/25/2013   Thumb  . FOOT FRACTURE SURGERY       reports that he has been smoking Cigarettes.  He has been smoking about 0.10 packs per day. He has never used smokeless tobacco. He reports that he does not drink alcohol or use drugs.  Allergies  Allergen Reactions  . Bee Venom Anaphylaxis  . Prochlorperazine Edisylate Other (See Comments)    Reaction: Jittery,uncomfortable feeling  . Cyclobenzaprine Hcl Other (See Comments)    REACTION: feel anxious, nausea  . Aleve [Naproxen Sodium] Nausea And Vomiting    States that this medication also causes abdominal pain  . Carbamazepine     REACTION: unknown reaction  .  Geodon [Ziprasidone Hcl] Nausea And Vomiting  . Ibuprofen Nausea And Vomiting    States that this medication also causes abdominal pain  . Maxzide [Hydrochlorothiazide W-Triamterene]     headache  . Nsaids Other (See Comments)    H/o severe anemia from UGIB from ulcer  . Tylenol [Acetaminophen]  Nausea And Vomiting    Patient states that this medication also causes abdominal pain  . Other Nausea And Vomiting, Rash and Other (See Comments)    ALL MUSCLE RELAXANTS: states that they affect sciatic nerve, may cause nausea and vomiting, rash    Family History  Problem Relation Age of Onset  . Diabetes Mother   . Hypertension Mother   . Hypertension Father   . Arthritis Father   . Diabetes Father      Prior to Admission medications   Medication Sig Start Date End Date Taking? Authorizing Provider  amLODipine (NORVASC) 10 MG tablet Take 1 tablet (10 mg total) by mouth daily. 10/14/16  Yes Fayrene Helper, MD  carvedilol (COREG) 3.125 MG tablet Take 1 tablet (3.125 mg total) by mouth 2 (two) times daily with a meal. 11/18/16  Yes Herminio Commons, MD  cloNIDine (CATAPRES) 0.3 MG tablet Take 0.5-1 tablets (0.15-0.3 mg total) by mouth 2 (two) times daily. Half tablet in the morning and one tablet in the evening. Take medication 12 hours apart 01/26/17  Yes Fayrene Helper, MD  hydrALAZINE (APRESOLINE) 25 MG tablet Take 3 tablets (75 mg total) by mouth 3 (three) times daily. 10/07/16 05/21/17 Yes Herminio Commons, MD  hydrOXYzine (VISTARIL) 25 MG capsule Take 1 capsule (25 mg total) by mouth every 6 (six) hours as needed for nausea. 12/25/16  Yes Lily Kocher, PA-C  nitroGLYCERIN (NITROSTAT) 0.4 MG SL tablet Place 1 tablet (0.4 mg total) under the tongue every 5 (five) minutes as needed for chest pain. 07/15/14  Yes Satira Sark, MD  oxyCODONE-acetaminophen (PERCOCET) 7.5-325 MG tablet Take 1 tablet by mouth 2 (two) times daily as needed for pain. 10/14/16  Yes [provider]  furosemide (LASIX) 20 MG tablet Take 1 tablet (20 mg total) by mouth 2 (two) times daily. Patient not taking: Reported on 05/21/2017 12/20/16   Fayrene Helper, MD    Physical Exam: Vitals:   05/21/17 1345 05/21/17 1400 05/21/17 1430 05/21/17 1435  BP:  (!) 189/106 (!) 191/110 (!) 187/101    Pulse: 65     Resp: 13 18 14 13   Temp:      TempSrc:      SpO2: 98%     Weight:      Height:          Constitutional: NAD, calm, comfortable Vitals:   05/21/17 1345 05/21/17 1400 05/21/17 1430 05/21/17 1435  BP:  (!) 189/106 (!) 191/110 (!) 187/101  Pulse: 65     Resp: 13 18 14 13   Temp:      TempSrc:      SpO2: 98%     Weight:      Height:       Eyes: PERRL, lids and conjunctivae normal ENMT: Mucous membranes are moist. Posterior pharynx clear of any exudate or lesions.Normal dentition.  Neck: normal, supple, no masses, no thyromegaly Respiratory: clear to auscultation bilaterally, no wheezing, no crackles. Normal respiratory effort. No accessory muscle use.  Cardiovascular: Regular rate and rhythm, no murmurs / rubs / gallops. No extremity edema. 2+ pedal pulses. No carotid bruits.  Abdomen: no tenderness, no masses palpated. No  hepatosplenomegaly. Bowel sounds positive.  Musculoskeletal: no clubbing / cyanosis. No joint deformity upper and lower extremities. Good ROM, no contractures. Normal muscle tone.  Skin: no rashes, lesions, ulcers. No induration Neurologic: CN 2-12 grossly intact. Sensation intact, DTR normal. Strength 5/5 in all 4.  Psychiatric: Normal judgment and insight. Alert and oriented x 3. Normal mood.   Labs on Admission: I have personally reviewed following labs and imaging studies  CBC:  Recent Labs Lab 05/21/17 1149  WBC 6.7  HGB 12.8*  HCT 36.4*  MCV 91.9  PLT 290   Basic Metabolic Panel:  Recent Labs Lab 05/21/17 1149  NA 140  K 3.6  CL 97*  CO2 31  GLUCOSE 92  BUN 25*  CREATININE 2.86*  CALCIUM 9.3   Cardiac Enzymes:  Recent Labs Lab 05/21/17 1151  TROPONINI 0.17*   Urine analysis:    Component Value Date/Time   BILIRUBINUR small 04/07/2011 1343   PROTEINUR 30 mg/dl 04/07/2011 1343   UROBILINOGEN 0.2 e.u/dL 04/07/2011 1343   NITRITE negative 04/07/2011 1343   LEUKOCYTESUR negative 04/07/2011 1343    EKG:  Independently reviewed.  Assessment/Plan Active Problems:   Migraine   CKD (chronic kidney disease) stage 3, GFR 30-59 ml/min   Sleep disorder   Nonspecific abnormal electrocardiogram (ECG) (EKG)   Hypertensive cardiomyopathy (HCC)   Elevated troponin   PLAN:   HA:  He has a negative neurological exam.  Will obtain head CT given severe HTN, and that his HA was severe, but I still suspect this is his migraine HA.  Will get his BP under better controlled.  His home anti HTN meds will be continued.  Will give morphine for pain.  HTN urgency:  Will continue with IV NTG, use Labetelol PRN for BP control.  Continue with all his anti HTN.  Will keep BP at 120 to 140.  Elevated troponin:  Elevated to 0.17.  Will consult cardiology.  Obtain ECHO.  Continue with cycling troponins.  He will need a nuclear stress test as per Dr Roxy Horseman.   His EKG showed deeply inverted T wave that is new as well.  But he denied any chest pain.  CKD:  Stable.  Get BP controlled, and avoid nephrotoxic drug.   Sleep apnea: Will continue with CPAP.    DVT prophylaxis: subQ heparin.  Code Status: FULL CODE.  Family Communication: wife and son at bedside.  Disposition Plan: to home.  Consults called: Phone consultation with Dr Rayann Heman.  Cardiology pending at this time.  Admission status: Inpatient.    Samarah Hogle MD FACP. [facility]  If 7PM-7AM, please contact night-coverage [email or web address] Password [email or web address]  05/21/2017, 2:56 PM

## 2017-05-21 NOTE — ED Provider Notes (Signed)
[facility] DEPT Provider Note   CSN: [id number] Arrival date & time: 05/21/17  1049   History   Chief Complaint Chief Complaint  Patient presents with  . Hypertension  . Migraine    HPI [name] is a 46 y.o. male.  HPI Patient presents to the emergency room for evaluation of headache and hypertension. Patient has history of migraines. He started having a headache couple of days ago. He's had nausea, vomiting, light sensitivity. The patient has been trying to take his blood pressure medications that he has been vomiting and is not sure if he's been keeping them down. This morning he was not able to take his medications. He denies any fevers. He denies any focal numbness or weakness. No trouble with his coordination, balance or speech.  No neck pain or stiffness. Past Medical History:  Diagnosis Date  . Chronic back pain   . Duodenitis with bleeding 08/2008   Ulcer - admitted to [facility]   . Essential hypertension, benign   . GERD (gastroesophageal reflux disease)   . Headache(784.0)   . Hyperlipidemia   . Migraines   . Obesity, unspecified     Patient Active Problem List   Diagnosis Date Noted  . Bilateral leg edema 12/26/2016  . Stress and adjustment reaction 11/04/2016  . Nausea without vomiting 09/12/2016  . Nonspecific abnormal electrocardiogram (ECG) (EKG) 08/30/2016  . Hyperlipidemia LDL goal <100 08/08/2015  . Sleep disorder 11/05/2014  . Obesity (BMI 30.0-34.9) 06/21/2014  . CKD (chronic kidney disease) stage 3, GFR 30-59 ml/min 02/23/2014  . Bilateral renal cysts 02/23/2014  . Nocturia 02/19/2014  . Fatigue due to sleep pattern disturbance 10/20/2008  . Malignant hypertension 10/09/2007  . Chronic migraine 10/09/2007    Past Surgical History:  Procedure Laterality Date  . 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 FINGER DEBRIDEMENT Left 09/25/2013   Thumb  . FOOT FRACTURE SURGERY         Home Medications    Prior to Admission medications   Medication Sig Start Date End Date Taking? Authorizing Provider  amLODipine (NORVASC) 10 MG tablet Take 1 tablet (10 mg total) by mouth daily. 10/14/16  Yes Fayrene Helper, MD  carvedilol (COREG) 3.125 MG tablet Take 1 tablet (3.125 mg total) by mouth 2 (two) times daily with a meal. 11/18/16  Yes Herminio Commons, MD  cloNIDine (CATAPRES) 0.3 MG tablet Take 0.5-1 tablets (0.15-0.3 mg total) by mouth 2 (two) times daily. Half tablet in the morning and one tablet in the evening. Take medication 12 hours apart 01/26/17  Yes Fayrene Helper, MD  hydrALAZINE (APRESOLINE) 25 MG tablet Take 3 tablets (75 mg total) by mouth 3 (three) times daily. 10/07/16 05/21/17 Yes Herminio Commons, MD  hydrOXYzine (VISTARIL) 25 MG capsule Take 1 capsule (25 mg total) by mouth every 6 (six) hours as needed for nausea. 12/25/16  Yes Lily Kocher, PA-C  nitroGLYCERIN (NITROSTAT) 0.4 MG SL tablet Place 1 tablet (0.4 mg total) under the tongue every 5 (five) minutes as needed for chest pain. 07/15/14  Yes Satira Sark, MD  oxyCODONE-acetaminophen (PERCOCET) 7.5-325 MG tablet Take 1 tablet by mouth 2 (two) times daily as needed for pain. 10/14/16  Yes [provider]  furosemide (LASIX) 20 MG tablet Take 1 tablet (20 mg total) by mouth  2 (two) times daily. Patient not taking: Reported on 05/21/2017 12/20/16   Fayrene Helper, MD    Family History Family History  Problem Relation Age of Onset  . Diabetes Mother   . Hypertension Mother   . Hypertension Father   . Arthritis Father   . Diabetes Father     Social History Social History  Substance Use Topics  . Smoking status: Current Some Day Smoker    Packs/day: 0.10    Types: Cigarettes    Last attempt to quit: 05/28/2012  . Smokeless tobacco: Never Used  .  Alcohol use No     Allergies   Bee venom; Prochlorperazine edisylate; Cyclobenzaprine hcl; Aleve [naproxen sodium]; Carbamazepine; Geodon [ziprasidone hcl]; Ibuprofen; Maxzide [hydrochlorothiazide w-triamterene]; Nsaids; Tylenol [acetaminophen]; and Other   Review of Systems Review of Systems  All other systems reviewed and are negative.    Physical Exam Updated Vital Signs BP (!) 215/129   Pulse 79   Temp 99.3 F (37.4 C) (Oral)   Resp 20   Ht 1.854 m (6\' 1" )   Wt 106.6 kg (235 lb)   SpO2 95%   BMI 31.00 kg/m   Physical Exam  Constitutional: No distress.  HENT:  Head: Normocephalic and atraumatic.  Right Ear: External ear normal.  Left Ear: External ear normal.  Eyes: Conjunctivae are normal. Right eye exhibits no discharge. Left eye exhibits no discharge. No scleral icterus.  Neck: Normal range of motion. Neck supple. No tracheal deviation present.  Cardiovascular: Normal rate, regular rhythm and intact distal pulses.   Pulmonary/Chest: Effort normal and breath sounds normal. No stridor. No respiratory distress. He has no wheezes. He has no rales.  Abdominal: Soft. Bowel sounds are normal. He exhibits no distension. There is no tenderness. There is no rebound and no guarding.  Musculoskeletal: He exhibits no edema or tenderness.  Neurological: He is alert. He has normal strength. No cranial nerve deficit (no facial droop, extraocular movements intact, no slurred speech) or sensory deficit. He exhibits normal muscle tone. He displays no seizure activity. Coordination normal.  Skin: Skin is warm and dry. No rash noted.  Psychiatric: He has a normal mood and affect.  Nursing note and vitals reviewed.    ED Treatments / Results  Labs (all labs ordered are listed, but only abnormal results are displayed) Labs Reviewed  CBC - Abnormal; Notable for the following:       Result Value   RBC 3.96 (*)    Hemoglobin 12.8 (*)    HCT 36.4 (*)    All other components within  normal limits  BASIC METABOLIC PANEL - Abnormal; Notable for the following:    Chloride 97 (*)    BUN 25 (*)    Creatinine, Ser 2.86 (*)    GFR calc non Af Amer 24 (*)    GFR calc Af Amer 28 (*)    All other components within normal limits  TROPONIN I - Abnormal; Notable for the following:    Troponin I 0.17 (*)    All other components within normal limits  I-STAT TROPONIN, ED    EKG  EKG Interpretation  Date/Time:  Sunday May 21 2017 11:44:19 EDT Ventricular Rate:  80 PR Interval:    QRS Duration: 103 QT Interval:  447 QTC Calculation: 516 R Axis:   -64 Text Interpretation:  Sinus rhythm Left anterior fascicular block Abnormal T, probable ischemia, widespread Minimal ST elevation, anterior leads Prolonged QT interval new since last tracing Confirmed by Tomi Bamberger,  Wille Glaser [phone]) on 05/21/2017 11:52:58 AM       Radiology No results found.  Procedures .Critical Care Performed by: Dorie Rank Authorized by: Dorie Rank   Critical care provider statement:    Critical care time (minutes):  30   Critical care was time spent personally by me on the following activities:  Discussions with consultants, evaluation of patient's response to treatment, examination of patient, ordering and performing treatments and interventions, ordering and review of laboratory studies, ordering and review of radiographic studies, pulse oximetry, re-evaluation of patient's condition, obtaining history from patient or surrogate and review of old charts   (including critical care time)  Medications Ordered in ED Medications  0.9 %  sodium chloride infusion ( Intravenous New Bag/Given 05/21/17 1215)  labetalol (NORMODYNE,TRANDATE) injection 20 mg (not administered)  labetalol (NORMODYNE,TRANDATE) 500 mg in dextrose 5 % 125 mL (4 mg/mL) infusion (not administered)  diphenhydrAMINE (BENADRYL) injection 25 mg (25 mg Intravenous Given 05/21/17 1202)  metoCLOPramide (REGLAN) injection 10 mg (10 mg Intravenous  Given 05/21/17 1204)  morphine 4 MG/ML injection 4 mg (4 mg Intravenous Given 05/21/17 1206)  labetalol (NORMODYNE,TRANDATE) injection 20 mg (20 mg Intravenous Given 05/21/17 1159)     Initial Impression / Assessment and Plan / ED Course  I have reviewed the triage vital signs and the nursing notes.  Pertinent labs & imaging results that were available during my care of the patient were reviewed by me and considered in my medical decision making (see chart for details).  Clinical Course as of May 21 1317  Sun May 21, 2017  1246 TRop elevated.  Pt has an asa allergy.  [JK]  1246 BP is improving with treatment.  Suspect trop is related to his htn urgency  and heart strain, less likely acute ischemia.  [EK]  8003 Discussed findings with patient.  He denies having any chest pain but states he did have some shortness of breath and chest pain yesterday.  BP was improving but has increased again.  Will give additional labetalol and start a drip  [JK]    Clinical Course User Index [JK] Dorie Rank, MD   Patient presented to the emergency room with complaints of headache that he associates with his migraine headache. Patient was noted to be hypertensive.  He had not been able to take his medications properly because of his nausea over the last couple of days.  In the emergency room patient was given labetalol with initial improvement in his blood pressure.  that was transient. EKG shows T-wave changes.  His initial troponin is elevated.  His symptoms are concerning for hypertensive emergency. Plan additional labetalol and labetalol drip. Admit to the hospital for further treatment  Final Clinical Impressions(s) / ED Diagnoses   Final diagnoses:  Hypertensive urgency, malignant  Elevated troponin  Chronic kidney disease, unspecified CKD stage      Dorie Rank, MD 05/21/17 1318

## 2017-05-21 NOTE — ED Notes (Signed)
Report to Vivian, RN.

## 2017-05-21 NOTE — ED Notes (Signed)
Dr Le at bedside.  

## 2017-05-21 NOTE — ED Notes (Signed)
Call to ICU for report  [name] will call back

## 2017-05-21 NOTE — Progress Notes (Signed)
eLink Physician-Brief Progress Note Patient Name: [name] DOB: 1971-05-13 MRN: [id number]   Date of Service  05/21/2017  HPI/Events of Note  Patient admitted by hospitalist with hypertensive urgency. Does have troponin elevation. No history of migraine headaches. Started on labetalol drip. CT head negative for intracerebral hemorrhage.   eICU Interventions  Continuing plan of care as per admitting service and other consultants.      Intervention Category Evaluation Type: New Patient Evaluation  Tera Partridge 05/21/2017, 4:36 PM

## 2017-05-21 NOTE — ED Triage Notes (Signed)
Pt c/o migraine, vomiting, headache with light sensitivity x 2 days.

## 2017-05-21 NOTE — ED Notes (Signed)
Critical call from lab:  Trop 0.17  Dr Tomi Bamberger notified

## 2017-05-21 NOTE — Plan of Care (Signed)
Problem: Tissue Perfusion: Goal: Risk factors for ineffective tissue perfusion will decrease Outcome: Progressing Pt receiving  Heparin subq for DVT prevention

## 2017-05-21 NOTE — ED Notes (Signed)
Pt reports a history of HTN, with admission here for same then transfer to [facility] for further treatment- has had N/V/ since yesterday with Vx2 today  He is photophobic, and has been unable to take his HTN meds due to N/V  Spouse is at bedside

## 2017-05-21 NOTE — ED Notes (Signed)
Pt continues to complain if head pain at 10/10

## 2017-05-21 NOTE — ED Notes (Signed)
Dr Knapp in to assess 

## 2017-05-21 NOTE — ED Notes (Signed)
To floor via stretcher.

## 2017-05-22 ENCOUNTER — Inpatient Hospital Stay (HOSPITAL_COMMUNITY): Payer: BLUE CROSS/BLUE SHIELD

## 2017-05-22 DIAGNOSIS — I351 Nonrheumatic aortic (valve) insufficiency: Secondary | ICD-10-CM

## 2017-05-22 LAB — ECHOCARDIOGRAM COMPLETE
CHL CUP DOP CALC LVOT VTI: 21 cm
CHL CUP MV DEC (S): 261
CHL CUP STROKE VOLUME: 80 mL
E decel time: 261 msec
EERAT: 8.8
FS: 38 % (ref 28–44)
HEIGHTINCHES: 69 in
IVS/LV PW RATIO, ED: 1.26
LA diam index: 1.47 cm/m2
LA vol A4C: 61.7 ml
LA vol index: 32.9 mL/m2
LASIZE: 34 mm
LAVOL: 76.1 mL
LDCA: 4.52 cm2
LEFT ATRIUM END SYS DIAM: 34 mm
LV PW d: 14.8 mm — AB (ref 0.6–1.1)
LV SIMPSON'S DISK: 63
LV dias vol: 126 mL (ref 62–150)
LV e' LATERAL: 7.83 cm/s
LV sys vol index: 20 mL/m2
LVDIAVOLIN: 54 mL/m2
LVEEAVG: 8.8
LVEEMED: 8.8
LVOT peak grad rest: 4 mmHg
LVOTD: 24 mm
LVOTPV: 104 cm/s
LVOTSV: 95 mL
LVSYSVOL: 46 mL (ref 21–61)
MV pk A vel: 68.6 m/s
MV pk E vel: 68.9 m/s
P 1/2 time: 592 ms
RV LATERAL S' VELOCITY: 17.2 cm/s
TAPSE: 23.9 mm
TDI e' lateral: 7.83
TDI e' medial: 7.29
WEIGHTICAEL: 3753.11 [oz_av]

## 2017-05-22 LAB — TROPONIN I
Troponin I: 0.12 ng/mL (ref <0.03)
Troponin I: 0.12 ng/mL (ref <0.03)

## 2017-05-22 MED ORDER — FENTANYL CITRATE (PF) 100 MCG/2ML IJ SOLN
25.0000 ug | INTRAMUSCULAR | Status: DC | PRN
  Administered 2017-05-22 – 2017-05-23 (×3): 25 ug via INTRAVENOUS
  Filled 2017-05-22 (×3): qty 2

## 2017-05-22 MED ORDER — MORPHINE SULFATE (PF) 4 MG/ML IV SOLN: 4.0000 mg | Freq: Once | INTRAVENOUS | Status: AC

## 2017-05-22 MED ORDER — CARVEDILOL 3.125 MG PO TABS
6.2500 mg | ORAL_TABLET | Freq: Two times a day (BID) | ORAL | Status: DC
  Filled 2017-05-22: qty 2

## 2017-05-22 MED ORDER — CARVEDILOL 3.125 MG PO TABS
6.2500 mg | ORAL_TABLET | Freq: Two times a day (BID) | ORAL | Status: DC
  Administered 2017-05-22 – 2017-05-23 (×3): 6.25 mg via ORAL
  Filled 2017-05-22 (×2): qty 2

## 2017-05-22 NOTE — Progress Notes (Addendum)
PROGRESS NOTE    [name]  QQI:[id number] DOB: October 19, 1970 DOA: 05/21/2017 PCP: Fayrene Helper, MD    Brief Narrative:  46 yo with CKD, HTN nephropathy, classical migraine, severe HTN, sleep apnea, admitted for acute migraine, severe HTN CMP requiring Nicardipine drip, positive troponin at 0.17, likely demand ischemia, and EKG showed new deep T wave inversion.     Assessment & Plan:   Active Problems:   Migraine   CKD (chronic kidney disease) stage 3, GFR 30-59 ml/min   Sleep disorder   Nonspecific abnormal electrocardiogram (ECG) (EKG)   Hypertensive cardiomyopathy (HCC)   Elevated troponin   1. HTN urgency:  Will increase Coreg, continue with IV Cardene and taper as allows.  I will obtain plasma metanephrines.  His TSH is normal.  His BP is not chronically elevated.   So we probably can get SBP a lot lower than 160. 2. Positive troponins:  Modest.  With CKD, but EKG showed new changes.  Continue with ECHO and awaiting cardiology consultation. Note RN's entry troponin of 1.2, but I didn't see it reported in the labs.  3. Migraine HA:  Pain meds given at this time.  Change Morphine to Fentanyl, as per patient's request, and morphine can aggravate HA.  Long term prophylaxis discussed and deferred to PCP. j 4. CKD:  Cr has elevated to 2.8 from 2.3.  Avoid nephrotoxic drug.  Suspect from hypertensive neprhopathy.  Get BP better control.  Will follow.  Will consult nephrology as well.    DVT prophylaxis: subQ Heparin.  Code Status: FULL CODE.  Family Communication: wife at bedside.  Disposition Plan:  Home.   Consultants:   Pending cardiology consultation.   Procedures:   None.   Antimicrobials: Anti-infectives    None       Subjective:  Better, but still has HA and photophobia.   Objective: Vitals:   05/22/17 0545 05/22/17 0600 05/22/17 0615 05/22/17 0800  BP: (!) 156/98 (!) 172/97 (!) 171/99   Pulse: 78 84 80   Resp: 14 17 11    Temp:    99.2 F (37.3 C)    TempSrc:    Oral  SpO2: 95% 97% 97%   Weight:      Height:        Intake/Output Summary (Last 24 hours) at 05/22/17 0922 Last data filed at 05/22/17 0600  Gross per 24 hour  Intake           859.56 ml  Output              800 ml  Net            59.56 ml   Filed Weights   05/21/17 1110 05/21/17 1611  Weight: 106.6 kg (235 lb) 106.4 kg (234 lb 9.1 oz)    Examination:  General exam: Appears calm and comfortable  Respiratory system: Clear to auscultation. Respiratory effort normal. Cardiovascular system: S1 & S2 heard, RRR. No JVD, murmurs, rubs, gallops or clicks. No pedal edema. Gastrointestinal system: Abdomen is nondistended, soft and nontender. No organomegaly or masses felt. Normal bowel sounds heard. Central nervous system: Alert and oriented. No focal neurological deficits. Extremities: Symmetric 5 x 5 power. Skin: No rashes, lesions or ulcers Psychiatry: Judgement and insight appear normal. Mood & affect appropriate.   Data Reviewed: I have personally reviewed following labs and imaging studies  CBC:  Recent Labs Lab 05/21/17 1149  WBC 6.7  HGB 12.8*  HCT 36.4*  MCV 91.9  PLT  383   Basic Metabolic Panel:  Recent Labs Lab 05/21/17 1149  NA 140  K 3.6  CL 97*  CO2 31  GLUCOSE 92  BUN 25*  CREATININE 2.86*  CALCIUM 9.3     Recent Labs Lab 05/21/17 1151 05/21/17 1647 05/21/17 2248 05/22/17 0419  TROPONINI 0.17* 0.14* 0.12* 0.12*   Thyroid Function Tests:  Recent Labs  05/21/17 1149  TSH 0.626    Recent Results (from the past 240 hour(s))  MRSA PCR Screening     Status: None   Collection Time: 05/21/17  4:14 PM  Result Value Ref Range Status   MRSA by PCR NEGATIVE NEGATIVE Final    Comment:        The GeneXpert MRSA Assay (FDA approved for NASAL specimens only), is one component of a comprehensive MRSA colonization surveillance program. It is not intended to diagnose MRSA infection nor to guide or monitor treatment for MRSA  infections.      Radiology Studies: Ct Head Wo Contrast  Result Date: 05/21/2017 CLINICAL DATA:  Headache for several days EXAM: CT HEAD WITHOUT CONTRAST TECHNIQUE: Contiguous axial images were obtained from the base of the skull through the vertex without intravenous contrast. COMPARISON:  08/31/2016 FINDINGS: Brain: No acute intracranial hemorrhage. No focal mass lesion. No CT evidence of acute infarction. No midline shift or mass effect. No hydrocephalus. Basilar cisterns are patent. Vascular: No hyperdense vessel or unexpected calcification. Skull: Normal. Negative for fracture or focal lesion. Sinuses/Orbits: Paranasal sinuses and mastoid air cells are clear. Orbits are clear. Other: None. IMPRESSION: Normal head CT. Electronically Signed   By: Suzy Bouchard M.D.   On: 05/21/2017 15:18    Scheduled Meds: . amLODipine  10 mg Oral Daily  . atorvastatin  40 mg Oral q1800  . carvedilol  6.25 mg Oral BID WC  . cloNIDine  0.15 mg Oral Daily   And  . cloNIDine  0.3 mg Oral QHS  . heparin  5,000 Units Subcutaneous Q8H  . hydrALAZINE  75 mg Oral TID   Continuous Infusions: . sodium chloride 10 mL/hr at 05/21/17 1456  . niCARDipine 5 mg/hr (05/22/17 0717)  . nitroGLYCERIN Stopped (05/21/17 2200)     LOS: 1 day   Gen Clagg, MD FACP Hospitalist.   If 7PM-7AM, please contact night-coverage [email or web address] Password [email or web address] 05/22/2017, 9:22 AM

## 2017-05-22 NOTE — Progress Notes (Signed)
CRITICAL VALUE ALERT  Critical Value:troponin 1.12 Date & Time Notied:  05/22/17 0120  Provider Notified: Olevia Bowens Orders Received/Actions taken: no new orders

## 2017-05-22 NOTE — Progress Notes (Signed)
*  PRELIMINARY RESULTS* Echocardiogram 2D Echocardiogram has been performed.  Samuel Germany 05/22/2017, 12:27 PM

## 2017-05-22 NOTE — Progress Notes (Signed)
Patient is asleep he has a migraine. CPAP is outside of his room. If needed for sleep amnia or low oxygen saturations. Will place if wife or patient desires. Nurse notified. Patient wears full face mask CPAP auto -6 to 12 as stated on sleep study results. No oxygen entrained.

## 2017-05-22 NOTE — Progress Notes (Signed)
Pt family refused CPAP machine states "he's doing okay right now and will call if he needs it."  RT will continue to monitor

## 2017-05-23 ENCOUNTER — Inpatient Hospital Stay (HOSPITAL_COMMUNITY): Payer: BLUE CROSS/BLUE SHIELD

## 2017-05-23 DIAGNOSIS — I43 Cardiomyopathy in diseases classified elsewhere: Secondary | ICD-10-CM

## 2017-05-23 DIAGNOSIS — I119 Hypertensive heart disease without heart failure: Secondary | ICD-10-CM

## 2017-05-23 DIAGNOSIS — R748 Abnormal levels of other serum enzymes: Secondary | ICD-10-CM

## 2017-05-23 LAB — HIV ANTIBODY (ROUTINE TESTING W REFLEX): HIV Screen 4th Generation wRfx: NONREACTIVE

## 2017-05-23 MED ORDER — HYDRALAZINE HCL 25 MG PO TABS
100.0000 mg | ORAL_TABLET | Freq: Three times a day (TID) | ORAL | Status: DC
  Administered 2017-05-23 – 2017-05-26 (×9): 100 mg via ORAL
  Filled 2017-05-23 (×9): qty 4

## 2017-05-23 MED ORDER — CARVEDILOL 12.5 MG PO TABS
12.5000 mg | ORAL_TABLET | Freq: Two times a day (BID) | ORAL | Status: DC
  Administered 2017-05-23 – 2017-05-24 (×3): 12.5 mg via ORAL
  Filled 2017-05-23 (×3): qty 1

## 2017-05-23 MED ORDER — FENTANYL CITRATE (PF) 100 MCG/2ML IJ SOLN
50.0000 ug | INTRAMUSCULAR | Status: DC | PRN
  Administered 2017-05-23 – 2017-05-24 (×6): 50 ug via INTRAVENOUS
  Filled 2017-05-23 (×6): qty 2

## 2017-05-23 NOTE — Care Management Note (Signed)
Case Management Note  Patient Details  Name: BRYNE LINDON MRN: [id number] Date of Birth: 12-22-1970  Subjective/Objective:  Adm with hypertensive cardiomyopathy. From home, ind PTA. Works at [employer]. No HH or DME PTA. On cardene drip, once off drip will need nuclear stress test.                Action/Plan: [place] home with self care. No CM needs known.   Expected Discharge Date:  05/24/17               Expected Discharge Plan:     In-House Referral:     Discharge planning Services  CM Consult  Post Acute Care Choice:  NA Choice offered to:  NA  DME Arranged:    DME Agency:     HH Arranged:    HH Agency:     Status of Service:  In process, will continue to follow  If discussed at Long Length of Stay Meetings, dates discussed:    Additional Comments:  Cleotilde Spadaccini, Chauncey Reading, RN 05/23/2017, 2:47 PM

## 2017-05-23 NOTE — Progress Notes (Signed)
PROGRESS NOTE    [name]  GDJ:[id number] DOB: 1971-05-11 DOA: 05/21/2017 PCP: Joel Helper, MD    Brief Narrative: 46 yo with CKD, HTN nephropathy, classical migraine, severe HTN, sleep apnea, admitted for acute migraine, severe HTN CMP requiring Nicardipine drip, positive troponin at 0.17, likely demand ischemia, and EKG showed new deep T wave inversion.  His BP is better controlled, and cardiology has seen him.  Plan to to titrate his BP, get off Cardene drip, and obtain Nuclear stress test.     Assessment & Plan:   Active Problems:   Migraine   CKD (chronic kidney disease) stage 3, GFR 30-59 ml/min   Sleep disorder   Nonspecific abnormal electrocardiogram (ECG) (EKG)   Hypertensive cardiomyopathy (HCC)   Elevated troponin   1. HTN urgency:  Will continue to increase coreg and Hydralazine per cardiology. continue with IV Cardene and taper as allows.  Plasma metanephrine is pending.   His TSH is normal.  His BP is not chronically elevated.   So we probably can get SBP a lot lower than 160. 2. Positive troponins:  Modest.  With CKD, but EKG showed new changes.  ECHO as noted.  Cardiology will review.  Due to significant T wave abnormality, will obtain nuclear stress test once BP is better.  Note RN's entry troponin of 1.2, but I didn't see it reported in the labs.  3. Migraine HA:  Pain meds given at this time.  Change Morphine to Fentanyl, as per patient's request, and morphine can aggravate HA.  Long term prophylaxis discussed and deferred to PCP. j 4. CKD:  Cr has elevated to 2.8 from 2.3.  Avoid nephrotoxic drug.  Suspect from hypertensive neprhopathy.  Get BP better control.  Will follow.  Will consult nephrology as well.    DVT prophylaxis: subQ Heparin.  Code Status: FULL CODE.  Family Communication: wife and mother at bedside.  Disposition Plan: Home.   Consultants:   Cardiology.   Procedures:   None.   Antimicrobials: Anti-infectives    None        Subjective:   HA is better.  Still there.   Objective: Vitals:   05/23/17 0530 05/23/17 0545 05/23/17 0600 05/23/17 0615  BP: (!) 142/84 (!) 154/101 (!) 155/98 (!) 146/103  Pulse: 94 88 89 84  Resp: 13 17 19 13   Temp:      TempSrc:      SpO2: 96% 97% 97% 97%  Weight:      Height:        Intake/Output Summary (Last 24 hours) at 05/23/17 1000 Last data filed at 05/23/17 0500  Gross per 24 hour  Intake             1106 ml  Output              425 ml  Net              681 ml   Filed Weights   05/21/17 1110 05/21/17 1611 05/23/17 0500  Weight: 106.6 kg (235 lb) 106.4 kg (234 lb 9.1 oz) 106.5 kg (234 lb 12.6 oz)    Examination:  General exam: Appears calm and comfortable  Respiratory system: Clear to auscultation. Respiratory effort normal. Cardiovascular system: S1 & S2 heard, RRR. No JVD, murmurs, rubs, gallops or clicks. No pedal edema. Gastrointestinal system: Abdomen is nondistended, soft and nontender. No organomegaly or masses felt. Normal bowel sounds heard. Central nervous system: Alert and oriented. No focal neurological deficits.  Extremities: Symmetric 5 x 5 power. Skin: No rashes, lesions or ulcers Psychiatry: Judgement and insight appear normal. Mood & affect appropriate.   Data Reviewed: I have personally reviewed following labs and imaging studies  CBC:  Recent Labs Lab 05/21/17 1149  WBC 6.7  HGB 12.8*  HCT 36.4*  MCV 91.9  PLT 833   Basic Metabolic Panel:  Recent Labs Lab 05/21/17 1149  NA 140  K 3.6  CL 97*  CO2 31  GLUCOSE 92  BUN 25*  CREATININE 2.86*  CALCIUM 9.3    Cardiac Enzymes:  Recent Labs Lab 05/21/17 1151 05/21/17 1647 05/21/17 2248 05/22/17 0419  TROPONINI 0.17* 0.14* 0.12* 0.12*   BNP (last 3 results) Thyroid Function Tests:  Recent Labs  05/21/17 1149  TSH 0.626    Recent Results (from the past 240 hour(s))  MRSA PCR Screening     Status: None   Collection Time: 05/21/17  4:14 PM  Result Value  Ref Range Status   MRSA by PCR NEGATIVE NEGATIVE Final    Comment:        The GeneXpert MRSA Assay (FDA approved for NASAL specimens only), is one component of a comprehensive MRSA colonization surveillance program. It is not intended to diagnose MRSA infection nor to guide or monitor treatment for MRSA infections.      Radiology Studies: Ct Head Wo Contrast  Result Date: 05/21/2017 CLINICAL DATA:  Headache for several days EXAM: CT HEAD WITHOUT CONTRAST TECHNIQUE: Contiguous axial images were obtained from the base of the skull through the vertex without intravenous contrast. COMPARISON:  08/31/2016 FINDINGS: Brain: No acute intracranial hemorrhage. No focal mass lesion. No CT evidence of acute infarction. No midline shift or mass effect. No hydrocephalus. Basilar cisterns are patent. Vascular: No hyperdense vessel or unexpected calcification. Skull: Normal. Negative for fracture or focal lesion. Sinuses/Orbits: Paranasal sinuses and mastoid air cells are clear. Orbits are clear. Other: None. IMPRESSION: Normal head CT. Electronically Signed   By: Suzy Bouchard M.D.   On: 05/21/2017 15:18    Scheduled Meds: . amLODipine  10 mg Oral Daily  . atorvastatin  40 mg Oral q1800  . carvedilol  12.5 mg Oral BID WC  . cloNIDine  0.15 mg Oral Daily   And  . cloNIDine  0.3 mg Oral QHS  . heparin  5,000 Units Subcutaneous Q8H  . hydrALAZINE  100 mg Oral TID   Continuous Infusions: . sodium chloride 10 mL/hr at 05/21/17 1456  . niCARDipine 2 mg/hr (05/23/17 0824)  . nitroGLYCERIN Stopped (05/21/17 2200)     LOS: 2 days   Lilibeth Opie, MD FACP Hospitalist.   If 7PM-7AM, please contact night-coverage [email or web address] Password [email or web address] 05/23/2017, 10:00 AM

## 2017-05-23 NOTE — Consult Note (Signed)
Reason for Consult: Hypertension and renal failure Referring Physician: Dr. Shelda Jakes is an 46 y.o. male.  HPI: He is a patient who has history of migraine headache, long-standing hypertension, chronic renal failure presently came with complaints of headache, nausea and severe photophobia. When patient was evaluated in emergency room he had severe hypertension and elevated creatinine and hence admitted to the hospital. Patient presently is feeling much better except the headache. He doesn't have any nausea or vomiting. As stated above patient was aware of his chronic renal failure and recommendation was made for him to see a nephrologist which he didn't do. He is being followed by his primary care physician by Dr. Moshe Cipro.  Past Medical History:  Diagnosis Date  . Chronic back pain   . Duodenitis with bleeding 08/2008   Ulcer - admitted to [facility]   . Essential hypertension, benign   . GERD (gastroesophageal reflux disease)   . Headache(784.0)   . Hyperlipidemia   . Migraines   . Obesity, unspecified     Past Surgical History:  Procedure Laterality Date  . 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 FINGER DEBRIDEMENT Left 09/25/2013   Thumb  . FOOT FRACTURE SURGERY      Family History  Problem Relation Age of Onset  . Diabetes Mother   . Hypertension Mother   . Hypertension Father   . Arthritis Father   . Diabetes Father     Social History:  reports that he has been smoking Cigarettes.  He has been smoking about 0.10 packs per day. He has never used smokeless tobacco. He reports that he does not drink alcohol or use drugs.  Allergies:  Allergies  Allergen Reactions  . Bee Venom Anaphylaxis  . Prochlorperazine Edisylate Other (See Comments)    Reaction: Jittery,uncomfortable feeling  .  Cyclobenzaprine Hcl Other (See Comments)    REACTION: feel anxious, nausea  . Aleve [Naproxen Sodium] Nausea And Vomiting    States that this medication also causes abdominal pain  . Carbamazepine     REACTION: unknown reaction  . Geodon [Ziprasidone Hcl] Nausea And Vomiting  . Ibuprofen Nausea And Vomiting    States that this medication also causes abdominal pain  . Maxzide [Hydrochlorothiazide W-Triamterene]     headache  . Nsaids Other (See Comments)    H/o severe anemia from UGIB from ulcer  . Tylenol [Acetaminophen] Nausea And Vomiting    Patient states that this medication also causes abdominal pain  . Other Nausea And Vomiting, Rash and Other (See Comments)    ALL MUSCLE RELAXANTS: states that they affect sciatic nerve, may cause nausea and vomiting, rash    Medications: I have reviewed the patient's current medications.  Results for orders placed or performed during the hospital encounter of 05/21/17 (from the past 48 hour(s))  CBC     Status: Abnormal   Collection Time: 05/21/17 11:49 AM  Result Value Ref Range   WBC 6.7 4.0 - 10.5 K/uL   RBC 3.96 (L) 4.22 - 5.81 MIL/uL   Hemoglobin 12.8 (L) 13.0 - 17.0 g/dL   HCT 36.4 (L) 39.0 - 52.0 %   MCV 91.9 78.0 - 100.0 fL   MCH 32.3 26.0 - 34.0 pg   MCHC 35.2 30.0 - 36.0 g/dL   RDW 11.6 11.5 - 15.5 %  Platelets 280 150 - 400 K/uL  Basic metabolic panel     Status: Abnormal   Collection Time: 05/21/17 11:49 AM  Result Value Ref Range   Sodium 140 135 - 145 mmol/L   Potassium 3.6 3.5 - 5.1 mmol/L   Chloride 97 (L) 101 - 111 mmol/L   CO2 31 22 - 32 mmol/L   Glucose, Bld 92 65 - 99 mg/dL   BUN 25 (H) 6 - 20 mg/dL   Creatinine, Ser 2.86 (H) 0.61 - 1.24 mg/dL   Calcium 9.3 8.9 - 10.3 mg/dL   GFR calc non Af Amer 24 (L) >60 mL/min   GFR calc Af Amer 28 (L) >60 mL/min    Comment: (NOTE) The eGFR has been calculated using the CKD EPI equation. This calculation has not been validated in all clinical situations. eGFR's  persistently <60 mL/min signify possible Chronic Kidney Disease.    Anion gap 12 5 - 15  TSH     Status: None   Collection Time: 05/21/17 11:49 AM  Result Value Ref Range   TSH 0.626 0.350 - 4.500 uIU/mL    Comment: Performed by a 3rd Generation assay with a functional sensitivity of <=0.01 uIU/mL.  Troponin I     Status: Abnormal   Collection Time: 05/21/17 11:51 AM  Result Value Ref Range   Troponin I 0.17 (HH) <0.03 ng/mL    Comment: CRITICAL RESULT CALLED TO, READ BACK BY AND VERIFIED WITH: [email or web address]. AT 1239 ON 05/21/2017 BY EVA   I-stat troponin, ED     Status: None   Collection Time: 05/21/17 11:53 AM  Result Value Ref Range   Troponin i, poc 0.07 0.00 - 0.08 ng/mL   Comment 3            Comment: Due to the release kinetics of cTnI, a negative result within the first hours of the onset of symptoms does not rule out myocardial infarction with certainty. If myocardial infarction is still suspected, repeat the test at appropriate intervals.   MRSA PCR Screening     Status: None   Collection Time: 05/21/17  4:14 PM  Result Value Ref Range   MRSA by PCR NEGATIVE NEGATIVE    Comment:        The GeneXpert MRSA Assay (FDA approved for NASAL specimens only), is one component of a comprehensive MRSA colonization surveillance program. It is not intended to diagnose MRSA infection nor to guide or monitor treatment for MRSA infections.   HIV antibody (Routine Testing)     Status: None   Collection Time: 05/21/17  4:47 PM  Result Value Ref Range   HIV Screen 4th Generation wRfx Non Reactive Non Reactive    Comment: (NOTE) Performed At: [facility] [place], [place] [id number] Lindon Romp MD WU:[id number]   Troponin I     Status: Abnormal   Collection Time: 05/21/17  4:47 PM  Result Value Ref Range   Troponin I 0.14 (HH) <0.03 ng/mL    Comment: CRITICAL VALUE NOTED.  VALUE IS CONSISTENT WITH PREVIOUSLY REPORTED AND CALLED VALUE.  Troponin I      Status: Abnormal   Collection Time: 05/21/17 10:48 PM  Result Value Ref Range   Troponin I 0.12 (HH) <0.03 ng/mL    Comment: CRITICAL RESULT CALLED TO, READ BACK BY AND VERIFIED WITH: GAMMONS,S @ 0057 BY MATTHEWS,B 9.3.18   Troponin I     Status: Abnormal   Collection Time: 05/22/17  4:19 AM  Result Value  Ref Range   Troponin I 0.12 (HH) <0.03 ng/mL    Comment: CRITICAL RESULT CALLED TO, READ BACK BY AND VERIFIED WITH: HOWARD,C @ 0512 ON 9.3.18 BY BOWMAN,L     Ct Head Wo Contrast  Result Date: 05/21/2017 CLINICAL DATA:  Headache for several days EXAM: CT HEAD WITHOUT CONTRAST TECHNIQUE: Contiguous axial images were obtained from the base of the skull through the vertex without intravenous contrast. COMPARISON:  08/31/2016 FINDINGS: Brain: No acute intracranial hemorrhage. No focal mass lesion. No CT evidence of acute infarction. No midline shift or mass effect. No hydrocephalus. Basilar cisterns are patent. Vascular: No hyperdense vessel or unexpected calcification. Skull: Normal. Negative for fracture or focal lesion. Sinuses/Orbits: Paranasal sinuses and mastoid air cells are clear. Orbits are clear. Other: None. IMPRESSION: Normal head CT. Electronically Signed   By: Suzy Bouchard M.D.   On: 05/21/2017 15:18    Review of Systems  Constitutional: Negative for chills and fever.       He has headache  Eyes: Positive for photophobia.  Cardiovascular: Positive for leg swelling.  Gastrointestinal: Positive for nausea. Negative for abdominal pain and vomiting.  Musculoskeletal: Positive for back pain.   Blood pressure (!) 146/103, pulse 84, temperature 99.4 F (37.4 C), temperature source Oral, resp. rate 13, height 5' 9"  (1.753 m), weight 106.5 kg (234 lb 12.6 oz), SpO2 97 %. Physical Exam  Constitutional: He is oriented to person, place, and time. No distress.  Eyes:  Patient has covered his face to avoid light because of headache  Neck: No JVD present.  Cardiovascular: Normal  rate and regular rhythm.   Respiratory: No respiratory distress. He has no wheezes.  GI: He exhibits no distension. There is no tenderness.  Musculoskeletal: He exhibits no edema.  Neurological: He is alert and oriented to person, place, and time.    Assessment/Plan: Problem #1 renal failure seems to be  chronic . The increase in is creatinine seems to be due to natural progression of his disease even though AKI can not be ruled out.                                                 His creatinine was 1.48 on 08/31/2008                                                                                                                  1.60 on 10/01/2013  1.95 on 04/10/2015                                                                                                                   2.24 on 08/31/2016                                                                                                                   2.27 on 12/28/2016 EGFR of 38 ccc/min Stage III                                                                                                                   Presently his creatinine  2.86 Etiology for his renal failure could be hypertension he is over that it will just cannot be ruled out. As stated above patient was referred to a nephrologist which she didn't keep up. Also there was study ordered by Dr. Moshe Cipro to do a renal Doppler which patient didn't follow through Problem #2 hypertension: Possibly primary. Workup for secondary cause of hypertension his pending. Presently his systolic blood pressure range between 140-160 which seems to be reasonable. Problem #3 migraine headache Problem #4 history of sleep apnea Problem #5 history of hypertensive cardiomyopathy Problem #6 chronic low back pain Problem #7 anemia: His hemoglobin is above our target goal..   plan: 1] Agree with  a work up for secondary causes of hypertenison 2]we'll  Check ANA , complement, hepatitis B  surface antigen and heaptitis C antibody  we'll check 24-hour urine for protein and creatinine clearance  we'll heck his renal anel in the morning  Brownie Gockel S 05/23/2017, 9:52 AM

## 2017-05-23 NOTE — Consult Note (Signed)
Primary cardiologist:Dr Bronson Ing Consulting cardiologist: Dr Carlyle Dolly Requesting physician: Dr Orvan Falconer Indication: SOB  Clinical Summary [name] is a 46 y.o.male history of malignant HTN, hypertensive heart disease, HL, CKD 3, OSA admitted with headache and HTN. He denies any chest pain or SOB. He reports compliance with home medications. In Er found to have SBP above 200, was started on nicardapine drip.    ER vitals: 215/129 p 79 Wt 235 lbs 95% 05/2017 echo LVEF 55-60%, no WMAs, grade I diastolicdysfunction 12/6960 renal artery [place]: no stenosis 06/2014 nuclear stress: mild inferior ischemia vs artifact, duke treadmill score of 9.5 CT head no acute process EKG SR, diffuse TWIs  Admit labs Hgb 12.8 Plt 280 Cr 2.86 BUN 25 TSH 0.626  Trop 0.17-->0.14-->0.12   Allergies  Allergen Reactions  . Bee Venom Anaphylaxis  . Prochlorperazine Edisylate Other (See Comments)    Reaction: Jittery,uncomfortable feeling  . Cyclobenzaprine Hcl Other (See Comments)    REACTION: feel anxious, nausea  . Aleve [Naproxen Sodium] Nausea And Vomiting    States that this medication also causes abdominal pain  . Carbamazepine     REACTION: unknown reaction  . Geodon [Ziprasidone Hcl] Nausea And Vomiting  . Ibuprofen Nausea And Vomiting    States that this medication also causes abdominal pain  . Maxzide [Hydrochlorothiazide W-Triamterene]     headache  . Nsaids Other (See Comments)    H/o severe anemia from UGIB from ulcer  . Tylenol [Acetaminophen] Nausea And Vomiting    Patient states that this medication also causes abdominal pain  . Other Nausea And Vomiting, Rash and Other (See Comments)    ALL MUSCLE RELAXANTS: states that they affect sciatic nerve, may cause nausea and vomiting, rash    Medications Scheduled Medications: . amLODipine  10 mg Oral Daily  . atorvastatin  40 mg Oral q1800  . carvedilol  6.25 mg Oral BID WC  . cloNIDine  0.15 mg Oral Daily   And  .  cloNIDine  0.3 mg Oral QHS  . heparin  5,000 Units Subcutaneous Q8H  . hydrALAZINE  75 mg Oral TID     Infusions: . sodium chloride 10 mL/hr at 05/21/17 1456  . niCARDipine 2 mg/hr (05/23/17 0824)  . nitroGLYCERIN Stopped (05/21/17 2200)     PRN Medications:  fentaNYL (SUBLIMAZE) injection, labetalol   Past Medical History:  Diagnosis Date  . Chronic back pain   . Duodenitis with bleeding 08/2008   Ulcer - admitted to [facility]   . Essential hypertension, benign   . GERD (gastroesophageal reflux disease)   . Headache(784.0)   . Hyperlipidemia   . Migraines   . Obesity, unspecified     Past Surgical History:  Procedure Laterality Date  . 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 FINGER DEBRIDEMENT Left 09/25/2013   Thumb  . FOOT FRACTURE SURGERY      Family History  Problem Relation Age of Onset  . Diabetes Mother   . Hypertension Mother   . Hypertension Father   . Arthritis Father   . Diabetes Father     Social History [name] reports that he has been smoking Cigarettes.  He has been smoking about 0.10 packs per day. He has never used smokeless tobacco. [name] reports that he does not drink alcohol.  Review  of Systems CONSTITUTIONAL: No weight loss, fever, chills, weakness or fatigue.  HEENT: Eyes: No visual loss, blurred vision, double vision or yellow sclerae. No hearing loss, sneezing, congestion, runny nose or sore throat.  SKIN: No rash or itching.  CARDIOVASCULAR: No chest pain, chest pressure or chest discomfort. No palpitations or edema.  RESPIRATORY: No shortness of breath, cough or sputum.  GASTROINTESTINAL: No anorexia, nausea, vomiting or diarrhea. No abdominal pain or blood.  GENITOURINARY: no polyuria, no dysuria NEUROLOGICAL: No headache, dizziness, syncope,  paralysis, ataxia, numbness or tingling in the extremities. No change in bowel or bladder control.  MUSCULOSKELETAL: No muscle, back pain, joint pain or stiffness.  HEMATOLOGIC: No anemia, bleeding or bruising.  LYMPHATICS: No enlarged nodes. No history of splenectomy.  PSYCHIATRIC: No history of depression or anxiety.      Physical Examination Blood pressure (!) 146/103, pulse 84, temperature 99.4 F (37.4 C), temperature source Oral, resp. rate 13, height 5\' 9"  (1.753 m), weight 234 lb 12.6 oz (106.5 kg), SpO2 97 %.  Intake/Output Summary (Last 24 hours) at 05/23/17 0852 Last data filed at 05/23/17 0500  Gross per 24 hour  Intake             1106 ml  Output              425 ml  Net              681 ml    HEENT: sclera clear, throat clear  Cardiovascular: RRR, no m/r/g, no jvd  Respiratory: CTAB  GI: abdomen soft, NT, ND  MSK: no LE edema  Neuro: no focal deficits  Psych: appropriate affect   Lab Results  Basic Metabolic Panel:  Recent Labs Lab 05/21/17 1149  NA 140  K 3.6  CL 97*  CO2 31  GLUCOSE 92  BUN 25*  CREATININE 2.86*  CALCIUM 9.3    Liver Function Tests: No results for input(s): AST, ALT, ALKPHOS, BILITOT, PROT, ALBUMIN in the last 168 hours.  CBC:  Recent Labs Lab 05/21/17 1149  WBC 6.7  HGB 12.8*  HCT 36.4*  MCV 91.9  PLT 280    Cardiac Enzymes:  Recent Labs Lab 05/21/17 1151 05/21/17 1647 05/21/17 2248 05/22/17 0419  TROPONINI 0.17* 0.14* 0.12* 0.12*    BNP: Invalid input(s): POCBNP   ECG   Imaging   Impression/Recommendations  1. Elevated troponin - mild troponin elevation in setting of severe HTN on presentation, troponin trending down. Markedly abnormal EKG with inferior and lateral precordial T-wave inversions, consider ischemia vs LV strain pattern. Echo with normal LVEF, no WMAs - poor renal function limits evaluation options. Would plan for nuclear stress test, if low to intermediate risk continue  medical management, if high risk may need to consider cath options despite his poor renal function - likely for nuclear stress test once off nicardapine drip and bp's are controlled.   2. 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 HTN - long history of severe HTN - negative renal US previously. Normal TSH this admit. Renin/aldo, metanephrines are pending. He has known sleep apnea, he reports compliance. Denies heavy NSAID or EtOH use, he does smoke. .  - had been started on nicardapine drip. Also on norvasc 10, coreg 6.25mg  bid, clonidine 0.15mg  daily, hydral 75mg  tid. BPs remain elevated. We will increase hydral to 100mg  tid. Increase coreg to 12.5mg  bid, follow bp's, may consider change to labetalol  4. OSA  Carlyle Dolly, M.D.

## 2017-05-24 DIAGNOSIS — R9431 Abnormal electrocardiogram [ECG] [EKG]: Secondary | ICD-10-CM

## 2017-05-24 DIAGNOSIS — I160 Hypertensive urgency: Secondary | ICD-10-CM

## 2017-05-24 DIAGNOSIS — N183 Chronic kidney disease, stage 3 (moderate): Secondary | ICD-10-CM

## 2017-05-24 DIAGNOSIS — I110 Hypertensive heart disease with heart failure: Secondary | ICD-10-CM

## 2017-05-24 LAB — BASIC METABOLIC PANEL
Anion gap: 8 (ref 5–15)
BUN: 33 mg/dL — ABNORMAL HIGH (ref 6–20)
CO2: 29 mmol/L (ref 22–32)
Calcium: 9 mg/dL (ref 8.9–10.3)
Chloride: 101 mmol/L (ref 101–111)
Creatinine, Ser: 2.46 mg/dL — ABNORMAL HIGH (ref 0.61–1.24)
GFR calc Af Amer: 34 mL/min — ABNORMAL LOW (ref >60)
GFR calc non Af Amer: 29 mL/min — ABNORMAL LOW (ref >60)
Glucose, Bld: 96 mg/dL (ref 65–99)
Potassium: 3.5 mmol/L (ref 3.5–5.1)
Sodium: 138 mmol/L (ref 135–145)

## 2017-05-24 LAB — HEPATITIS C ANTIBODY: HCV Ab: <0.1 s/co ratio (ref 0.0–0.9)

## 2017-05-24 LAB — PROTEIN, URINE, 24 HOUR
Collection Interval-UPROT: 24 hours
Protein, 24H Urine: 360 mg/d — ABNORMAL HIGH (ref 50–100)
Protein, Urine: 40 mg/dL
URINE TOTAL VOLUME-UPROT: 900 mL

## 2017-05-24 LAB — C4 COMPLEMENT: COMPLEMENT C4, BODY FLUID: 48 mg/dL — AB (ref 14–44)

## 2017-05-24 LAB — ANTINUCLEAR ANTIBODIES, IFA: ANTINUCLEAR ANTIBODIES, IFA: NEGATIVE

## 2017-05-24 LAB — COMPLEMENT, TOTAL

## 2017-05-24 LAB — C3 COMPLEMENT: C3 COMPLEMENT: 147 mg/dL (ref 82–167)

## 2017-05-24 LAB — HEPATITIS B SURFACE ANTIGEN: Hepatitis B Surface Ag: NEGATIVE

## 2017-05-24 MED ORDER — ACETAMINOPHEN 325 MG PO TABS
650.0000 mg | ORAL_TABLET | Freq: Four times a day (QID) | ORAL | Status: DC | PRN
  Administered 2017-05-24 – 2017-05-25 (×4): 650 mg via ORAL
  Filled 2017-05-24 (×4): qty 2

## 2017-05-24 MED ORDER — OXYCODONE HCL 5 MG PO TABS
5.0000 mg | ORAL_TABLET | Freq: Four times a day (QID) | ORAL | Status: DC | PRN
  Administered 2017-05-24 – 2017-05-26 (×6): 5 mg via ORAL
  Filled 2017-05-24 (×6): qty 1

## 2017-05-24 NOTE — Progress Notes (Signed)
Progress Note  Patient Name: [name] Date of Encounter: 05/24/2017   Subjective  No chest pain, no SOB   Inpatient Medications    Scheduled Meds: . amLODipine  10 mg Oral Daily  . atorvastatin  40 mg Oral q1800  . carvedilol  12.5 mg Oral BID WC  . cloNIDine  0.15 mg Oral Daily   And  . cloNIDine  0.3 mg Oral QHS  . heparin  5,000 Units Subcutaneous Q8H  . hydrALAZINE  100 mg Oral TID   Continuous Infusions: . sodium chloride 10 mL/hr at 05/23/17 1300  . niCARDipine Stopped (05/23/17 1430)  . nitroGLYCERIN Stopped (05/21/17 2200)   PRN Meds: fentaNYL (SUBLIMAZE) injection, labetalol   Vital Signs    Vitals:   05/24/17 0500 05/24/17 0600 05/24/17 0754 05/24/17 0800  BP: (!) 137/100 (!) 151/94  (!) 160/112  Pulse: 64 70 83 70  Resp: (!) 9 10 (!) 21 (!) 21  Temp:   98.7 F (37.1 C)   TempSrc:   Oral   SpO2: 94% 96% 100% 94%  Weight: 234 lb 2.1 oz (106.2 kg)     Height:        Intake/Output Summary (Last 24 hours) at 05/24/17 0918 Last data filed at 05/24/17 0600  Gross per 24 hour  Intake           1729.5 ml  Output              300 ml  Net           1429.5 ml   Filed Weights   05/21/17 1611 05/23/17 0500 05/24/17 0500  Weight: 234 lb 9.1 oz (106.4 kg) 234 lb 12.6 oz (106.5 kg) 234 lb 2.1 oz (106.2 kg)    Telemetry    NSR - Personally Reviewed  ECG    n/a  Physical Exam   GEN: No acute distress.   Neck: No JVD Cardiac: RRR, no murmurs, rubs, or gallops.  Respiratory: Clear to auscultation bilaterally. GI: Soft, nontender, non-distended  MS: No edema; No deformity. Neuro:  Nonfocal  Psych: Normal affect   Labs    Chemistry Recent Labs Lab 05/21/17 1149  NA 140  K 3.6  CL 97*  CO2 31  GLUCOSE 92  BUN 25*  CREATININE 2.86*  CALCIUM 9.3  GFRNONAA 24*  GFRAA 28*  ANIONGAP 12     Hematology Recent Labs Lab 05/21/17 1149  WBC 6.7  RBC 3.96*  HGB 12.8*  HCT 36.4*  MCV 91.9  MCH 32.3  MCHC 35.2  RDW 11.6  PLT  280    Cardiac Enzymes Recent Labs Lab 05/21/17 1151 05/21/17 1647 05/21/17 2248 05/22/17 0419  TROPONINI 0.17* 0.14* 0.12* 0.12*    Recent Labs Lab 05/21/17 1153  TROPIPOC 0.07     BNPNo results for input(s): BNP, PROBNP in the last 168 hours.   DDimer No results for input(s): DDIMER in the last 168 hours.   Radiology    US Renal  Result Date: 05/23/2017 CLINICAL DATA:  Acute on chronic renal failure EXAM: RENAL / URINARY TRACT ULTRASOUND COMPLETE COMPARISON:  None. FINDINGS: Exam is suboptimal in the patient is unresponsive or, heavily medicated difficult to position Right Kidney: Length: 5.2 cm. No hydronephrosis. Anechoic cysts measuring 2.4 cm. Left Kidney: Length: 11.0 cm. No hydronephrosis Two anechoic cysts measuring 3 cm and 2 cm. Bladder: Appears normal for degree of bladder distention. IMPRESSION:.: IMPRESSION:. 1. No hydronephrosis. 2. Bilateral benign-appearing renal cysts. Electronically Signed  By: Suzy Bouchard M.D.   On: 05/23/2017 12:33    Cardiac Studies    Patient Profile     [name] is a 46 y.o.male history of malignant HTN, hypertensive heart disease, HL, CKD 3, OSA admitted with headache and HTN. He denies any chest pain or SOB. He reports compliance with home medications. In Er found to have SBP above 200, was started on nicardapine drip.   Assessment & Plan    1. Elevated troponin - mild troponin elevation in setting of severe HTN on presentation, troponin trending down. Markedly abnormal EKG with inferior and lateral precordial T-wave inversions, consider ischemia vs LV strain pattern. Echo with normal LVEF, no WMAs - poor renal function limits evaluation options. Would plan for nuclear stress test, if low to intermediate risk continue medical management, if high risk may need to consider cath options despite his poor renal function  - likely for nuclear stress test tomorrow pending bp control.   2. Aortic valve growth - I will review  echo, suspected lambls excrescence which is benign  3. HTN - long history of severe HTN - negative renal US previously. Normal TSH this admit. Renin/aldo, metanephrines are pending. He has known sleep apnea, he reports compliance. Denies heavy NSAID or EtOH use, he does smoke. .  - yesterday increased hydral to 100mg  tid, coreg to 12.5mg  bid.  - had been started on nicardapine drip. Also on norvasc 10, coreg 6.25mg  bid, clonidine 0.15mg  daily, hydral 75mg  tid. BPs remain elevated. We will increase hydral to 100mg  tid. Increase coreg to 12.5mg  bid, follow bp's, may consider change to labetalol  - follow bp's today, room to titrate coreg further if needed. He is off nicardapine drip.   4. OSA - cpap at night  Signed, Carlyle Dolly, MD  05/24/2017, 9:18 AM

## 2017-05-24 NOTE — Consult Note (Signed)
[name]  MRN: [id number]  DOB/AGE: 1971/08/12 46 y.o.  Primary Care Physician:Simpson, Norwood Levo, MD  Admit date: 05/21/2017  Chief Complaint:  Chief Complaint  Patient presents with  . Hypertension  . Migraine    S-Pt presented on  05/21/2017 with  Chief Complaint  Patient presents with  . Hypertension  . Migraine  .    Pt today feels better.    Pt says " I know I need to take care of myself"   Meds . amLODipine  10 mg Oral Daily  . atorvastatin  40 mg Oral q1800  . carvedilol  12.5 mg Oral BID WC  . cloNIDine  0.15 mg Oral Daily   And  . cloNIDine  0.3 mg Oral QHS  . heparin  5,000 Units Subcutaneous Q8H  . hydrALAZINE  100 mg Oral TID       Physical Exam: Vital signs in last 24 hours: Temp:  [97.6 F (36.4 C)-98.8 F (37.1 C)] 98.7 F (37.1 C) (09/05 0754) Pulse Rate:  [62-99] 66 (09/05 0900) Resp:  [8-21] 14 (09/05 0900) BP: (126-174)/(71-112) 155/108 (09/05 0900) SpO2:  [93 %-100 %] 95 % (09/05 0900) Weight:  [234 lb 2.1 oz (106.2 kg)] 234 lb 2.1 oz (106.2 kg) (09/05 0500) Weight change: -10.6 oz (-0.3 kg) Last BM Date: 05/20/17  Intake/Output from previous day: 09/04 0701 - 09/05 0700 In: 1971.5 [P.O.:1440; I.V.:529.5] Out: 300 [Urine:300] Total I/O In: 30 [I.V.:30] Out: -    Physical Exam: General- pt is awake,alert, oriented to time place and person Resp- No acute REsp distress, CTA B/L NO Rhonchi CVS- S1S2 regular in rate and rhythm GIT- BS+, soft, NT, ND EXT- NO LE Edema, Cyanosis   Lab Results: CBC  Recent Labs  05/21/17 1149  WBC 6.7  HGB 12.8*  HCT 36.4*  PLT 280    BMET  Recent Labs  05/21/17 1149  NA 140  K 3.6  CL 97*  CO2 31  GLUCOSE 92  BUN 25*  CREATININE 2.86*  CALCIUM 9.3    Creat trend 2018  2.8           2.2--2.6 ( baseline)  2017  1.7--2.3 2016   1.9--2.0 2015   1.6--1.7 2013    1.7 2012    1.8 2011    1.87 2009    1.4-1.8  MICRO Recent Results (from the past 240 hour(s))  MRSA  PCR Screening     Status: None   Collection Time: 05/21/17  4:14 PM  Result Value Ref Range Status   MRSA by PCR NEGATIVE NEGATIVE Final    Comment:        The GeneXpert MRSA Assay (FDA approved for NASAL specimens only), is one component of a comprehensive MRSA colonization surveillance program. It is not intended to diagnose MRSA infection nor to guide or monitor treatment for MRSA infections.       Lab Results  Component Value Date   CALCIUM 9.3 05/21/2017         Impression: 1)Renal  AKI secondary to Prerenal/ATN/ HTN Urgency vs CKD progression               AKI on CKD               CKD stage 4.               CKD since 2009               CKD secondary to HTN/ sleep  apnea                Progression of CKD as expected with uncontrolled HTN                Proteinura will check.  2)HTN    BP now much better             Secondary causes of HTN            Sleep apnea            CKD                          Target organ damage              LVH              CKD       Medication-  On Calcium Channel Blockers On Alpha and beta Blockers. On Vasodilators- Hydralzine On Central Acting Sympatholytics.   3)Anemia HGb at goal (9--11)   4)CKD Mineral-Bone Disorder Calcium is at goal.  5)CAD-admitted with elevated trops Cardiology and  Primary MD following  6)Electrolytes  Normokalemic NOrmonatremic   7)Acid base Co2 at goal     Plan:  24 hour proteinuria results pending Awaiting aldosterone results Will ask for BMET today and in am to see the creat trend       [place] 05/24/2017, 10:40 AM

## 2017-05-24 NOTE — Progress Notes (Signed)
PROGRESS NOTE    [name]  EPP:[id number] DOB: 1971/04/29 DOA: 05/21/2017 PCP: Fayrene Helper, MD     Brief Narrative:  46 year old man admitted to the hospital from home on 9/2 due to headache. He does have a history of migraines however was found to have hypertensive emergency with blood pressure of around 230/110 with elevated troponins and markedly abnormal EKG and hence admission was requested.   Assessment & Plan:   Active Problems:   Migraine   CKD (chronic kidney disease) stage 3, GFR 30-59 ml/min   Sleep disorder   Nonspecific abnormal electrocardiogram (ECG) (EKG)   Hypertensive cardiomyopathy (HCC)   Elevated troponin   Hypertensive emergency -Initially requiring labetalol drip. -Has now been transitioned off IV medications, cardiology has titrated oral antihypertensive agents today, hydralazine has been increased 200 mg 2 times a day, Coreg increased to 12.5 mg twice daily. Also remains on Norvasc 10 mg and clonidine 0.4 mg total daily dose.  -TSH is normal, workup for pheochromocytoma and hyperaldosteronism is in process.  Elevated troponins -In the range of 0.12-0.14, 2-D echo with ejection fraction of 60-63%, grade 1 diastolic dysfunction and normal wall motion. -His EKG is markedly abnormal with inferior and lateral T-wave inversions and ST segment changes to consider ischemia versus a left ventricular strain pattern. -Cardiology is on board and is planning for nuclear stress test if blood pressure allows. If intermediate or high test may need to consider cardiac catheterization despite his not optimal renal function.  Chronic kidney disease stage IV -Baseline creatinine is around 2.3-2.8. -Is 2.46 today. -Avoid nephrotoxic agents.  Headache -Suspect this is blood pressure related versus his chronic migraines. -He has been on fentanyl since admission, is starting to feel dizzy and lightheaded. -Will discontinue fentanyl and place on Tylenol with  occasional OxyIR dosing for severe pain. This has been discussed with patient, wife and RN.   DVT prophylaxis: Subcutaneous heparin Code Status: Full code Family Communication: Wife at bedside Disposition Plan: Transfer to floor, nuclear medicine stress test pending improved blood pressure control  Consultants:   Cardiology  Procedures:   None  Antimicrobials:  Anti-infectives    None       Subjective: Mild headache, feels dizzy and lightheaded, denies chest pain or shortness of breath.  Objective: Vitals:   05/24/17 1600 05/24/17 1603 05/24/17 1700 05/24/17 1800  BP: 140/85  (!) 144/100 137/87  Pulse: 75 71 79 69  Resp: (!) 22 12 19 14   Temp:  98.9 F (37.2 C)    TempSrc:  Oral    SpO2: 97% 97% 97% 91%  Weight:      Height:        Intake/Output Summary (Last 24 hours) at 05/24/17 1914 Last data filed at 05/24/17 1700  Gross per 24 hour  Intake              760 ml  Output              300 ml  Net              460 ml   Filed Weights   05/21/17 1611 05/23/17 0500 05/24/17 0500  Weight: 106.4 kg (234 lb 9.1 oz) 106.5 kg (234 lb 12.6 oz) 106.2 kg (234 lb 2.1 oz)    Examination:  General exam: Alert, awake, oriented x 3 Respiratory system: Clear to auscultation. Respiratory effort normal. Cardiovascular system:RRR. No murmurs, rubs, gallops. Gastrointestinal system: Abdomen is nondistended, soft and nontender. No organomegaly or masses  felt. Normal bowel sounds heard. Central nervous system: Alert and oriented. No focal neurological deficits. Extremities: Trace bilateral pitting edema Skin: No rashes, lesions or ulcers Psychiatry: Judgement and insight appear normal. Mood & affect appropriate.     Data Reviewed: I have personally reviewed following labs and imaging studies  CBC:  Recent Labs Lab 05/21/17 1149  WBC 6.7  HGB 12.8*  HCT 36.4*  MCV 91.9  PLT 182   Basic Metabolic Panel:  Recent Labs Lab 05/21/17 1149 05/24/17 1021  NA 140 138   K 3.6 3.5  CL 97* 101  CO2 31 29  GLUCOSE 92 96  BUN 25* 33*  CREATININE 2.86* 2.46*  CALCIUM 9.3 9.0   GFR: Estimated Creatinine Clearance: 44.1 mL/min (A) (by C-G formula based on SCr of 2.46 mg/dL (H)). Liver Function Tests: No results for input(s): AST, ALT, ALKPHOS, BILITOT, PROT, ALBUMIN in the last 168 hours. No results for input(s): LIPASE, AMYLASE in the last 168 hours. No results for input(s): AMMONIA in the last 168 hours. Coagulation Profile: No results for input(s): INR, PROTIME in the last 168 hours. Cardiac Enzymes:  Recent Labs Lab 05/21/17 1151 05/21/17 1647 05/21/17 2248 05/22/17 0419  TROPONINI 0.17* 0.14* 0.12* 0.12*   BNP (last 3 results) No results for input(s): PROBNP in the last 8760 hours. HbA1C: No results for input(s): HGBA1C in the last 72 hours. CBG: No results for input(s): GLUCAP in the last 168 hours. Lipid Profile: No results for input(s): CHOL, HDL, LDLCALC, TRIG, CHOLHDL, LDLDIRECT in the last 72 hours. Thyroid Function Tests: No results for input(s): TSH, T4TOTAL, FREET4, T3FREE, THYROIDAB in the last 72 hours. Anemia Panel: No results for input(s): VITAMINB12, FOLATE, FERRITIN, TIBC, IRON, RETICCTPCT in the last 72 hours. Urine analysis:    Component Value Date/Time   BILIRUBINUR small 04/07/2011 1343   PROTEINUR 30 mg/dl 04/07/2011 1343   UROBILINOGEN 0.2 e.u/dL 04/07/2011 1343   NITRITE negative 04/07/2011 1343   LEUKOCYTESUR negative 04/07/2011 1343   Sepsis Labs: @LABRCNTIP (procalcitonin:4,lacticidven:4)  ) Recent Results (from the past 240 hour(s))  MRSA PCR Screening     Status: None   Collection Time: 05/21/17  4:14 PM  Result Value Ref Range Status   MRSA by PCR NEGATIVE NEGATIVE Final    Comment:        The GeneXpert MRSA Assay (FDA approved for NASAL specimens only), is one component of a comprehensive MRSA colonization surveillance program. It is not intended to diagnose MRSA infection nor to guide  or monitor treatment for MRSA infections.          Radiology Studies: US Renal  Result Date: 05/23/2017 CLINICAL DATA:  Acute on chronic renal failure EXAM: RENAL / URINARY TRACT ULTRASOUND COMPLETE COMPARISON:  None. FINDINGS: Exam is suboptimal in the patient is unresponsive or, heavily medicated difficult to position Right Kidney: Length: 5.2 cm. No hydronephrosis. Anechoic cysts measuring 2.4 cm. Left Kidney: Length: 11.0 cm. No hydronephrosis Two anechoic cysts measuring 3 cm and 2 cm. Bladder: Appears normal for degree of bladder distention. IMPRESSION:.: IMPRESSION:. 1. No hydronephrosis. 2. Bilateral benign-appearing renal cysts. Electronically Signed   By: Suzy Bouchard M.D.   On: 05/23/2017 12:33        Scheduled Meds: . amLODipine  10 mg Oral Daily  . atorvastatin  40 mg Oral q1800  . carvedilol  12.5 mg Oral BID WC  . cloNIDine  0.15 mg Oral Daily   And  . cloNIDine  0.3 mg Oral QHS  . heparin  5,000 Units Subcutaneous Q8H  . hydrALAZINE  100 mg Oral TID   Continuous Infusions: . sodium chloride 10 mL/hr at 05/23/17 1300     LOS: 3 days    Time spent: 35 minutes. Greater than 50% of this time was spent in direct contact with the patient coordinating care.     Lelon Frohlich, MD [facility] Pager [phone]  If 7PM-7AM, please contact night-coverage [email or web address] Password [email or web address] 05/24/2017, 7:14 PM

## 2017-05-25 ENCOUNTER — Encounter (HOSPITAL_COMMUNITY): Payer: Self-pay

## 2017-05-25 ENCOUNTER — Inpatient Hospital Stay (HOSPITAL_BASED_OUTPATIENT_CLINIC_OR_DEPARTMENT_OTHER): Payer: BLUE CROSS/BLUE SHIELD

## 2017-05-25 DIAGNOSIS — I160 Hypertensive urgency: Secondary | ICD-10-CM | POA: Diagnosis not present

## 2017-05-25 DIAGNOSIS — I43 Cardiomyopathy in diseases classified elsewhere: Secondary | ICD-10-CM | POA: Diagnosis not present

## 2017-05-25 DIAGNOSIS — R9431 Abnormal electrocardiogram [ECG] [EKG]: Secondary | ICD-10-CM

## 2017-05-25 LAB — BASIC METABOLIC PANEL
Anion gap: 9 (ref 5–15)
BUN: 34 mg/dL — ABNORMAL HIGH (ref 6–20)
CO2: 27 mmol/L (ref 22–32)
Calcium: 8.5 mg/dL — ABNORMAL LOW (ref 8.9–10.3)
Chloride: 102 mmol/L (ref 101–111)
Creatinine, Ser: 2.3 mg/dL — ABNORMAL HIGH (ref 0.61–1.24)
GFR calc Af Amer: 37 mL/min — ABNORMAL LOW (ref >60)
GFR calc non Af Amer: 32 mL/min — ABNORMAL LOW (ref >60)
Glucose, Bld: 97 mg/dL (ref 65–99)
Potassium: 3.2 mmol/L — ABNORMAL LOW (ref 3.5–5.1)
Sodium: 138 mmol/L (ref 135–145)

## 2017-05-25 LAB — CBC
HCT: 35.9 % — ABNORMAL LOW (ref 39.0–52.0)
Hemoglobin: 12.4 g/dL — ABNORMAL LOW (ref 13.0–17.0)
MCH: 32.1 pg (ref 26.0–34.0)
MCHC: 34.5 g/dL (ref 30.0–36.0)
MCV: 93 fL (ref 78.0–100.0)
Platelets: 280 10*3/uL (ref 150–400)
RBC: 3.86 MIL/uL — ABNORMAL LOW (ref 4.22–5.81)
RDW: 11.6 % (ref 11.5–15.5)
WBC: 4.8 10*3/uL (ref 4.0–10.5)

## 2017-05-25 LAB — NM MYOCAR MULTI W/SPECT W/WALL MOTION / EF
CHL CUP NUCLEAR SSS: 1
CSEPPHR: 96 {beats}/min
LHR: 0.4
LVDIAVOL: 202 mL (ref 62–150)
LVSYSVOL: 115 mL
Rest HR: 62 {beats}/min
SDS: 1
SRS: 0
TID: 1.07

## 2017-05-25 LAB — PHOSPHORUS: Phosphorus: 4.6 mg/dL (ref 2.5–4.6)

## 2017-05-25 LAB — METANEPHRINES, PLASMA
METANEPHRINE FREE: 40 pg/mL (ref 0–62)
NORMETANEPHRINE FREE: 197 pg/mL — AB (ref 0–145)

## 2017-05-25 MED ORDER — TECHNETIUM TC 99M TETROFOSMIN IV KIT
10.0000 | PACK | Freq: Once | INTRAVENOUS | Status: AC | PRN
  Administered 2017-05-25: 10 via INTRAVENOUS

## 2017-05-25 MED ORDER — TECHNETIUM TC 99M TETROFOSMIN IV KIT
30.0000 | PACK | Freq: Once | INTRAVENOUS | Status: AC | PRN
Start: 2017-05-25 — End: 2017-05-25
  Administered 2017-05-25: 30 via INTRAVENOUS

## 2017-05-25 MED ORDER — CARVEDILOL 3.125 MG PO TABS
18.7500 mg | ORAL_TABLET | Freq: Two times a day (BID) | ORAL | Status: DC
  Administered 2017-05-25 – 2017-05-26 (×2): 18.75 mg via ORAL
  Filled 2017-05-25 (×2): qty 2

## 2017-05-25 MED ORDER — SODIUM CHLORIDE 0.9% FLUSH
INTRAVENOUS | Status: AC
  Administered 2017-05-25: 10 mL via INTRAVENOUS
  Filled 2017-05-25: qty 10

## 2017-05-25 MED ORDER — REGADENOSON 0.4 MG/5ML IV SOLN
INTRAVENOUS | Status: AC
  Administered 2017-05-25: 0.4 mg via INTRAVENOUS
  Filled 2017-05-25: qty 5

## 2017-05-25 MED ORDER — POTASSIUM CHLORIDE CRYS ER 20 MEQ PO TBCR
40.0000 meq | EXTENDED_RELEASE_TABLET | Freq: Two times a day (BID) | ORAL | Status: AC
  Administered 2017-05-25 (×2): 40 meq via ORAL
  Filled 2017-05-25 (×2): qty 2

## 2017-05-25 NOTE — Progress Notes (Signed)
Patient returned from procedure and re-established telemetry

## 2017-05-25 NOTE — Progress Notes (Signed)
Patient refusing CPAP for tonight. Hospital unit still at bedside. Patient told to call if he changes his mind and would like to wear it.

## 2017-05-25 NOTE — Progress Notes (Signed)
PROGRESS NOTE    [name]  MVH:[id number] DOB: Sep 06, 1971 DOA: 05/21/2017 PCP: Fayrene Helper, MD     Brief Narrative:  46 year old man admitted to the hospital from home on 9/2 due to headache. He does have a history of migraines however was found to have hypertensive emergency with blood pressure of around 230/110 with elevated troponins and markedly abnormal EKG and hence admission was requested.   Assessment & Plan:   Active Problems:   Migraine   CKD (chronic kidney disease) stage 3, GFR 30-59 ml/min   Sleep disorder   Nonspecific abnormal electrocardiogram (ECG) (EKG)   Hypertensive cardiomyopathy (HCC)   Elevated troponin   Hypertensive emergency -Initially requiring labetalol drip. -Has now been transitioned off IV medications, cardiology has titrated oral antihypertensive agents today, hydralazine has been increased 200 mg 2 times a day, Coreg increased to 12.5 mg twice daily. Also remains on Norvasc 10 mg and clonidine 0.4 mg total daily dose.  -TSH is normal, workup for pheochromocytoma and hyperaldosteronism is in process. -Blood pressure is much improved with systolics in the 841L to 244W.  Elevated troponins -In the range of 0.12-0.14, 2-D echo with ejection fraction of 10-27%, grade 1 diastolic dysfunction and normal wall motion. -His EKG is markedly abnormal with inferior and lateral T-wave inversions and ST segment changes to consider ischemia versus a left ventricular strain pattern. -Cardiology is on board and is planning for nuclear stress test today. Further recommendations to be made based on results.  Chronic kidney disease stage IV -Baseline creatinine is around 2.3-2.8. -Is 2.30 today. -Avoid nephrotoxic agents.  Headache -Suspect this is blood pressure related versus his chronic migraines. -Much improved today.    DVT prophylaxis: Subcutaneous heparin Code Status: Full code Family Communication: Wife at bedside Disposition Plan:  Transfer to floor, nuclear medicine stress test results  pending   Consultants:   Cardiology   Procedures: Nuclear medicine stress test   Antimicrobials:  Anti-infectives    None       Subjective: Sitting in chair, headache has improved, denies chest pain or shortness of breath.   Objective: Vitals:   05/25/17 0600 05/25/17 0700 05/25/17 0738 05/25/17 0800  BP: (!) 155/97     Pulse: 64 98 73 67  Resp: 19 10 17  (!) 6  Temp:   98.5 F (36.9 C)   TempSrc:   Oral   SpO2: 95% 95% 98% 94%  Weight:      Height:        Intake/Output Summary (Last 24 hours) at 05/25/17 1502 Last data filed at 05/25/17 0905  Gross per 24 hour  Intake              230 ml  Output                0 ml  Net              230 ml   Filed Weights   05/21/17 1611 05/23/17 0500 05/24/17 0500  Weight: 106.4 kg (234 lb 9.1 oz) 106.5 kg (234 lb 12.6 oz) 106.2 kg (234 lb 2.1 oz)    Examination:  General exam: Alert, awake, oriented x 3 Respiratory system: Clear to auscultation. Respiratory effort normal. Cardiovascular system:RRR. No murmurs, rubs, gallops. Gastrointestinal system: Abdomen is nondistended, soft and nontender. No organomegaly or masses felt. Normal bowel sounds heard. Central nervous system: Alert and oriented. No focal neurological deficits. Extremities: Trace bilateral lower extremity edema, positive pulses Skin: No rashes, lesions or  ulcers Psychiatry: Judgement and insight appear normal. Mood & affect appropriate.        Data Reviewed: I have personally reviewed following labs and imaging studies  CBC:  Recent Labs Lab 05/21/17 1149 05/25/17 0536  WBC 6.7 4.8  HGB 12.8* 12.4*  HCT 36.4* 35.9*  MCV 91.9 93.0  PLT 280 342   Basic Metabolic Panel:  Recent Labs Lab 05/21/17 1149 05/24/17 1021 05/25/17 0536  NA 140 138 138  K 3.6 3.5 3.2*  CL 97* 101 102  CO2 31 29 27   GLUCOSE 92 96 97  BUN 25* 33* 34*  CREATININE 2.86* 2.46* 2.30*  CALCIUM 9.3 9.0 8.5*    PHOS  --   --  4.6   GFR: Estimated Creatinine Clearance: 47.2 mL/min (A) (by C-G formula based on SCr of 2.3 mg/dL (H)). Liver Function Tests: No results for input(s): AST, ALT, ALKPHOS, BILITOT, PROT, ALBUMIN in the last 168 hours. No results for input(s): LIPASE, AMYLASE in the last 168 hours. No results for input(s): AMMONIA in the last 168 hours. Coagulation Profile: No results for input(s): INR, PROTIME in the last 168 hours. Cardiac Enzymes:  Recent Labs Lab 05/21/17 1151 05/21/17 1647 05/21/17 2248 05/22/17 0419  TROPONINI 0.17* 0.14* 0.12* 0.12*   BNP (last 3 results) No results for input(s): PROBNP in the last 8760 hours. HbA1C: No results for input(s): HGBA1C in the last 72 hours. CBG: No results for input(s): GLUCAP in the last 168 hours. Lipid Profile: No results for input(s): CHOL, HDL, LDLCALC, TRIG, CHOLHDL, LDLDIRECT in the last 72 hours. Thyroid Function Tests: No results for input(s): TSH, T4TOTAL, FREET4, T3FREE, THYROIDAB in the last 72 hours. Anemia Panel: No results for input(s): VITAMINB12, FOLATE, FERRITIN, TIBC, IRON, RETICCTPCT in the last 72 hours. Urine analysis:    Component Value Date/Time   BILIRUBINUR small 04/07/2011 1343   PROTEINUR 30 mg/dl 04/07/2011 1343   UROBILINOGEN 0.2 e.u/dL 04/07/2011 1343   NITRITE negative 04/07/2011 1343   LEUKOCYTESUR negative 04/07/2011 1343   Sepsis Labs: @LABRCNTIP (procalcitonin:4,lacticidven:4)  ) Recent Results (from the past 240 hour(s))  MRSA PCR Screening     Status: None   Collection Time: 05/21/17  4:14 PM  Result Value Ref Range Status   MRSA by PCR NEGATIVE NEGATIVE Final    Comment:        The GeneXpert MRSA Assay (FDA approved for NASAL specimens only), is one component of a comprehensive MRSA colonization surveillance program. It is not intended to diagnose MRSA infection nor to guide or monitor treatment for MRSA infections.          Radiology Studies: Nm Myocar Multi  W/spect W/wall Motion / Ef  Result Date: 05/25/2017  There was no ST segment deviation noted during stress.  The study is normal. There are no perfusion defects consistent with prior infarct or current ischemia  This is an intermediate risk study. Risk is based on decreased LVEF, there is no myocardium at jeopardy. Consider correlating LVEF with echo.  The left ventricular ejection fraction is moderately decreased (30-44%).         Scheduled Meds: . amLODipine  10 mg Oral Daily  . atorvastatin  40 mg Oral q1800  . carvedilol  18.75 mg Oral BID WC  . cloNIDine  0.15 mg Oral Daily   And  . cloNIDine  0.3 mg Oral QHS  . heparin  5,000 Units Subcutaneous Q8H  . hydrALAZINE  100 mg Oral TID  . potassium chloride  40 mEq  Oral BID   Continuous Infusions: . sodium chloride 10 mL/hr at 05/23/17 1300     LOS: 4 days    Time spent: 35 minutes. Greater than 50% of this time was spent in direct contact with the patient coordinating care.     Lelon Frohlich, MD [facility] Pager [phone]  If 7PM-7AM, please contact night-coverage [email or web address] Password [email or web address] 05/25/2017, 3:02 PM

## 2017-05-25 NOTE — Progress Notes (Signed)
Negative stress test. EKG and enzymes appears to be related to severe HTN and HTN cardiomyopathy. No further cardiac testing at this time. BP's remain elevated but are improving, increase coreg to 18.75mg  bid, I do not see any severe low heart rates and think he will tolerate this dose. His primary cardiologist may make further adjustments as needed at outpatient f/u and also f/u and continue workup for possible secondary HTN. We will sign off inpatient care.   Carlyle Dolly MD

## 2017-05-25 NOTE — Progress Notes (Signed)
Subjective: Interval History: has no complaint of patient is feeling much better. His headache has improved..  Objective: Vital signs in last 24 hours: Temp:  [98 F (36.7 C)-98.9 F (37.2 C)] 98.5 F (36.9 C) (09/06 0738) Pulse Rate:  [59-98] 67 (09/06 0800) Resp:  [6-22] 6 (09/06 0800) BP: (121-158)/(78-112) 155/97 (09/06 0600) SpO2:  [91 %-98 %] 94 % (09/06 0800) Weight change:   Intake/Output from previous day: 09/05 0701 - 09/06 0700 In: 220 [I.V.:220] Out: -  Intake/Output this shift: Total I/O In: 40 [I.V.:40] Out: -   General appearance: alert, cooperative and no distress Resp: clear to auscultation bilaterally Cardio: prominent apical impulse Extremities: No edema  Lab Results:  Recent Labs  05/25/17 0536  WBC 4.8  HGB 12.4*  HCT 35.9*  PLT 280   BMET:  Recent Labs  05/24/17 1021 05/25/17 0536  NA 138 138  K 3.5 3.2*  CL 101 102  CO2 29 27  GLUCOSE 96 97  BUN 33* 34*  CREATININE 2.46* 2.30*  CALCIUM 9.0 8.5*   No results for input(s): PTH in the last 72 hours. Iron Studies: No results for input(s): IRON, TIBC, TRANSFERRIN, FERRITIN in the last 72 hours.  Studies/Results: US Renal  Result Date: 05/23/2017 CLINICAL DATA:  Acute on chronic renal failure EXAM: RENAL / URINARY TRACT ULTRASOUND COMPLETE COMPARISON:  None. FINDINGS: Exam is suboptimal in the patient is unresponsive or, heavily medicated difficult to position Right Kidney: Length: 5.2 cm. No hydronephrosis. Anechoic cysts measuring 2.4 cm. Left Kidney: Length: 11.0 cm. No hydronephrosis Two anechoic cysts measuring 3 cm and 2 cm. Bladder: Appears normal for degree of bladder distention. IMPRESSION:.: IMPRESSION:. 1. No hydronephrosis. 2. Bilateral benign-appearing renal cysts. Electronically Signed   By: Suzy Bouchard M.D.   On: 05/23/2017 12:33    I have reviewed the patient's current medications.  Assessment/Plan: Problem #1 renal failure: His renal function is improving. Possibly  has acute on chronic. The etiology for his chronic renal failure could be secondary to hypertensive nephrosclerosis/sleep apnea/ischemic. Other etiologies such as renal artery stenosis cannot be ruled out.  Problem #2 and equal kidneys: Right kidney is 5.2 cm possibly an inferior associated with difficulty in visualizing. Ultrasound from 2015 showed right kidney to be 12.2 left kidney 12.3. If however patient has unequal kidney we need to rule out renal artery stenosis especially because of his uncontrolled hypertension. Problem #3 hypertension: His blood pressure seems to be somewhat better. Patient was mild elevation of norepinephrine. Renine and aldosterone level is pending Problem #4 migraine headache Problem #5 hypokalemia: His potassium is low Problem #6 patient with history of sleep apnea: Patient is on CPAP At home Plan: We'll give him KCl 40 mEq by mouth 2 times a day 2] we check his renal panel in the morning 3] we'll do a renal Doppler as an outpatient.   LOS: 4 days   Brayson Livesey S 05/25/2017,10:32 AM

## 2017-05-26 DIAGNOSIS — N189 Chronic kidney disease, unspecified: Secondary | ICD-10-CM

## 2017-05-26 DIAGNOSIS — N179 Acute kidney failure, unspecified: Secondary | ICD-10-CM

## 2017-05-26 LAB — ALDOSTERONE + RENIN ACTIVITY W/ RATIO
ALDO / PRA RATIO: 4.1 (ref 0.0–30.0)
Aldosterone: 3.5 ng/dL (ref 0.0–30.0)
PRA LC/MS/MS: 0.856 ng/mL/hr (ref 0.167–5.380)

## 2017-05-26 LAB — RENAL FUNCTION PANEL
ALBUMIN: 4 g/dL (ref 3.5–5.0)
ANION GAP: 7 (ref 5–15)
BUN: 30 mg/dL — AB (ref 6–20)
CALCIUM: 8.8 mg/dL — AB (ref 8.9–10.3)
CO2: 29 mmol/L (ref 22–32)
Chloride: 101 mmol/L (ref 101–111)
Creatinine, Ser: 2.37 mg/dL — ABNORMAL HIGH (ref 0.61–1.24)
GFR calc Af Amer: 36 mL/min — ABNORMAL LOW (ref >60)
GFR calc non Af Amer: 31 mL/min — ABNORMAL LOW (ref >60)
GLUCOSE: 100 mg/dL — AB (ref 65–99)
PHOSPHORUS: 4.6 mg/dL (ref 2.5–4.6)
Potassium: 4 mmol/L (ref 3.5–5.1)
Sodium: 137 mmol/L (ref 135–145)

## 2017-05-26 MED ORDER — ATORVASTATIN CALCIUM 40 MG PO TABS: 40.0000 mg | ORAL_TABLET | Freq: Every day | ORAL | 2 refills | Status: DC

## 2017-05-26 MED ORDER — HYDRALAZINE HCL 100 MG PO TABS: 100.0000 mg | ORAL_TABLET | Freq: Three times a day (TID) | ORAL | 2 refills | Status: DC

## 2017-05-26 MED ORDER — CARVEDILOL 6.25 MG PO TABS: 18.7500 mg | ORAL_TABLET | Freq: Two times a day (BID) | ORAL | 2 refills | Status: DC

## 2017-05-26 MED ORDER — CLONIDINE HCL 0.3 MG PO TABS: 0.3000 mg | ORAL_TABLET | Freq: Two times a day (BID) | ORAL | 2 refills | Status: DC

## 2017-05-26 MED ORDER — AMLODIPINE BESYLATE 10 MG PO TABS: 10.0000 mg | ORAL_TABLET | Freq: Every day | ORAL | 2 refills | Status: DC

## 2017-05-26 NOTE — Discharge Summary (Signed)
Physician Discharge Summary  [name] EGB:[id number] DOB: 1971/05/31 DOA: 05/21/2017  PCP: Joel Helper, MD  Admit date: 05/21/2017 Discharge date: 05/26/2017  Time spent: 45 minutes  Recommendations for Outpatient Follow-up:  -Will be discharged home today. -Has follow up shceduled with cardiology later this month.   Discharge Diagnoses:  Active Problems:   Migraine   CKD (chronic kidney disease) stage 3, GFR 30-59 ml/min   Sleep disorder   Nonspecific abnormal electrocardiogram (ECG) (EKG)   Hypertensive cardiomyopathy (HCC)   Elevated troponin   Discharge Condition: Stable and improved  Filed Weights   05/21/17 1611 05/23/17 0500 05/24/17 0500  Weight: 106.4 kg (234 lb 9.1 oz) 106.5 kg (234 lb 12.6 oz) 106.2 kg (234 lb 2.1 oz)    History of present illness:  As per Dr. Marin Dennis on 9/2: [name] is an 46 y.o. male with hx of classical migraine, severe HTN, chronic low back pain, CKD 3,  Hx of abnormal EKG with chest pain, last negative stress test in Oct 2015, with plan for nuclear stress test once BP controlled per Dr Dyke Brackett, hx of sleep apnea, presented to the ER with nausea, photophobia, HA, similar to his usual migraine, except it is more severe.  Evaluation in the ER showed SBP 224!!!  and DBP over 110, with troponin elevated to 0.17, and EKG showed new deeply inverted T waves over the precordial leads.  He was started on IV Labetelol drip, and hospitalist was asked to admit him for HTN urgency.  I saw the EKG, and asked Dr Tomi Bamberger to consult cardiology. He spoke with Dr Rayann Heman, and he felt that patient can be admitted here with medical Tx at [facility].     Hospital Course:   Hypertensive emergency -Initially requiring labetalol drip. -Has now been transitioned off IV medications, cardiology has titrated oral antihypertensive agents today, hydralazine has been increased to 100 mg 2 times a day, Coreg increased to 18.75 mg twice daily. Also remains on Norvasc  10 mg and clonidine 0.6 mg total daily dose.  -TSH is normal, workup for pheochromocytoma and hyperaldosteronism is in process. -Blood pressure is much improved with systolics in the 371G to 626R. -OK for DC home today.  Elevated troponins -In the range of 0.12-0.14, 2-D echo with ejection fraction of 48-54%, grade 1 diastolic dysfunction and normal wall motion. -His EKG is markedly abnormal with inferior and lateral T-wave inversions and ST segment changes to consider ischemia versus a left ventricular strain pattern. -Cardiology is on board and performed a nuclear medicine stress test on 9/6: normal study without perfusion defects. -Per cardiology, no further work up planned for at present.  Chronic kidney disease stage IV -Baseline creatinine is around 2.3-2.8. -Is 2.37 on DC. -Avoid nephrotoxic agents.  Headache -Suspect this is blood pressure related versus his chronic migraines. -Much improved today.    Procedures:  Stress test as above   Consultations:  Cardiology  Discharge Instructions  Discharge Instructions    Diet - low sodium heart healthy    Complete by:  As directed    Increase activity slowly    Complete by:  As directed      Allergies as of 05/26/2017      Reactions   Bee Venom Anaphylaxis   Prochlorperazine Edisylate Other (See Comments)   Reaction: Jittery,uncomfortable feeling   Cyclobenzaprine Hcl Other (See Comments)   REACTION: feel anxious, nausea   Aleve [naproxen Sodium] Nausea And Vomiting   States that this  medication also causes abdominal pain   Carbamazepine    REACTION: unknown reaction   Geodon [ziprasidone Hcl] Nausea And Vomiting   Ibuprofen Nausea And Vomiting   States that this medication also causes abdominal pain   Maxzide [hydrochlorothiazide W-triamterene]    headache   Nsaids Other (See Comments)   H/o severe anemia from UGIB from ulcer   Tylenol [acetaminophen] Nausea And Vomiting   Patient states that this medication  also causes abdominal pain   Other Nausea And Vomiting, Rash, Other (See Comments)   ALL MUSCLE RELAXANTS: states that they affect sciatic nerve, may cause nausea and vomiting, rash      Medication List    STOP taking these medications   furosemide 20 MG tablet Commonly known as:  LASIX   oxyCODONE-acetaminophen 7.5-325 MG tablet Commonly known as:  PERCOCET     TAKE these medications   amLODipine 10 MG tablet Commonly known as:  NORVASC Take 1 tablet (10 mg total) by mouth daily.   atorvastatin 40 MG tablet Commonly known as:  LIPITOR Take 1 tablet (40 mg total) by mouth daily at 6 PM.   carvedilol 6.25 MG tablet Commonly known as:  COREG Take 3 tablets (18.75 mg total) by mouth 2 (two) times daily with a meal. What changed:  medication strength  how much to take   cloNIDine 0.3 MG tablet Commonly known as:  CATAPRES Take 1 tablet (0.3 mg total) by mouth 2 (two) times daily. Half tablet in the morning and one tablet in the evening. Take medication 12 hours apart What changed:  how much to take   hydrALAZINE 100 MG tablet Commonly known as:  APRESOLINE Take 1 tablet (100 mg total) by mouth 3 (three) times daily. What changed:  medication strength  how much to take Notes to patient:  Try to take each dose six hours apart.   hydrOXYzine 25 MG capsule Commonly known as:  VISTARIL Take 1 capsule (25 mg total) by mouth every 6 (six) hours as needed for nausea.   nitroGLYCERIN 0.4 MG SL tablet Commonly known as:  NITROSTAT Place 1 tablet (0.4 mg total) under the tongue every 5 (five) minutes as needed for chest pain.            Discharge Care Instructions        Start     Ordered   05/26/17 0000  amLODipine (NORVASC) 10 MG tablet  Daily     05/26/17 0944   05/26/17 0000  atorvastatin (LIPITOR) 40 MG tablet  Daily-1800     05/26/17 0944   05/26/17 0000  carvedilol (COREG) 6.25 MG tablet  2 times daily with meals     05/26/17 0944   05/26/17 0000   cloNIDine (CATAPRES) 0.3 MG tablet  2 times daily    Comments:  Dose reduction effective 08/29/2016   05/26/17 0944   05/26/17 0000  hydrALAZINE (APRESOLINE) 100 MG tablet  3 times daily     05/26/17 0944   05/26/17 0000  Increase activity slowly     05/26/17 0944   05/26/17 0000  Diet - low sodium heart healthy     05/26/17 0944     Allergies  Allergen Reactions  . Bee Venom Anaphylaxis  . Prochlorperazine Edisylate Other (See Comments)    Reaction: Jittery,uncomfortable feeling  . Cyclobenzaprine Hcl Other (See Comments)    REACTION: feel anxious, nausea  . Aleve [Naproxen Sodium] Nausea And Vomiting    States that this medication also causes abdominal  pain  . Carbamazepine     REACTION: unknown reaction  . Geodon [Ziprasidone Hcl] Nausea And Vomiting  . Ibuprofen Nausea And Vomiting    States that this medication also causes abdominal pain  . Maxzide [Hydrochlorothiazide W-Triamterene]     headache  . Nsaids Other (See Comments)    H/o severe anemia from UGIB from ulcer  . Tylenol [Acetaminophen] Nausea And Vomiting    Patient states that this medication also causes abdominal pain  . Other Nausea And Vomiting, Rash and Other (See Comments)    ALL MUSCLE RELAXANTS: states that they affect sciatic nerve, may cause nausea and vomiting, rash   Follow-up Information    Fran Lowes, MD Follow up in 4 week(s).   Specialty:  Nephrology Contact information: [address] [address] [phone]        Lendon Colonel, NP Follow up.   Specialties:  Nurse Practitioner, Radiology, Cardiology Contact information: [place] [address] [phone]            The results of significant diagnostics from this hospitalization (including imaging, microbiology, ancillary and laboratory) are listed below for reference.    Significant Diagnostic Studies: Ct Head Wo Contrast  Result Date: 05/21/2017 CLINICAL DATA:  Headache for  several days EXAM: CT HEAD WITHOUT CONTRAST TECHNIQUE: Contiguous axial images were obtained from the base of the skull through the vertex without intravenous contrast. COMPARISON:  08/31/2016 FINDINGS: Brain: No acute intracranial hemorrhage. No focal mass lesion. No CT evidence of acute infarction. No midline shift or mass effect. No hydrocephalus. Basilar cisterns are patent. Vascular: No hyperdense vessel or unexpected calcification. Skull: Normal. Negative for fracture or focal lesion. Sinuses/Orbits: Paranasal sinuses and mastoid air cells are clear. Orbits are clear. Other: None. IMPRESSION: Normal head CT. Electronically Signed   By: Suzy Bouchard M.D.   On: 05/21/2017 15:18   US Renal  Result Date: 05/23/2017 CLINICAL DATA:  Acute on chronic renal failure EXAM: RENAL / URINARY TRACT ULTRASOUND COMPLETE COMPARISON:  None. FINDINGS: Exam is suboptimal in the patient is unresponsive or, heavily medicated difficult to position Right Kidney: Length: 5.2 cm. No hydronephrosis. Anechoic cysts measuring 2.4 cm. Left Kidney: Length: 11.0 cm. No hydronephrosis Two anechoic cysts measuring 3 cm and 2 cm. Bladder: Appears normal for degree of bladder distention. IMPRESSION:.: IMPRESSION:. 1. No hydronephrosis. 2. Bilateral benign-appearing renal cysts. Electronically Signed   By: Suzy Bouchard M.D.   On: 05/23/2017 12:33   Nm Myocar Multi W/spect W/wall Motion / Ef  Result Date: 05/25/2017  There was no ST segment deviation noted during stress.  The study is normal. There are no perfusion defects consistent with prior infarct or current ischemia  This is an intermediate risk study. Risk is based on decreased LVEF, there is no myocardium at jeopardy. Consider correlating LVEF with echo.  The left ventricular ejection fraction is moderately decreased (30-44%).     Microbiology: Recent Results (from the past 240 hour(s))  MRSA PCR Screening     Status: None   Collection Time: 05/21/17  4:14 PM    Result Value Ref Range Status   MRSA by PCR NEGATIVE NEGATIVE Final    Dennis:        The GeneXpert MRSA Assay (FDA approved for NASAL specimens only), is one component of a comprehensive MRSA colonization surveillance program. It is not intended to diagnose MRSA infection nor to guide or monitor treatment for MRSA infections.      Labs:  Basic Metabolic Panel:  Recent Labs Lab 05/21/17 1149 05/24/17 1021 05/25/17 0536 05/26/17 0536  NA 140 138 138 137  K 3.6 3.5 3.2* 4.0  CL 97* 101 102 101  CO2 31 29 27 29   GLUCOSE 92 96 97 100*  BUN 25* 33* 34* 30*  CREATININE 2.86* 2.46* 2.30* 2.37*  CALCIUM 9.3 9.0 8.5* 8.8*  PHOS  --   --  4.6 4.6   Liver Function Tests:  Recent Labs Lab 05/26/17 0536  ALBUMIN 4.0   No results for input(s): LIPASE, AMYLASE in the last 168 hours. No results for input(s): AMMONIA in the last 168 hours. CBC:  Recent Labs Lab 05/21/17 1149 05/25/17 0536  WBC 6.7 4.8  HGB 12.8* 12.4*  HCT 36.4* 35.9*  MCV 91.9 93.0  PLT 280 280   Cardiac Enzymes:  Recent Labs Lab 05/21/17 1151 05/21/17 1647 05/21/17 2248 05/22/17 0419  TROPONINI 0.17* 0.14* 0.12* 0.12*   BNP: BNP (last 3 results)  Recent Labs  08/31/16 1619 12/28/16 0835  BNP 71.0 6.2    ProBNP (last 3 results) No results for input(s): PROBNP in the last 8760 hours.  CBG: No results for input(s): GLUCAP in the last 168 hours.     SignedLelon Frohlich  [facility] Pager: [phone] 05/26/2017, 11:55 AM

## 2017-05-26 NOTE — Progress Notes (Signed)
Discharge instructions given to patient, medications reviewd and new prescriptions explained, education materials about hypertension given. IV removed, patient discharging home with wife via car. Daneil Dolin, RN

## 2017-06-07 DIAGNOSIS — G4733 Obstructive sleep apnea (adult) (pediatric): Secondary | ICD-10-CM | POA: Diagnosis not present

## 2017-06-07 NOTE — Progress Notes (Signed)
Cardiology Office Note   Date:  06/08/2017   ID:  [name], DOB [birth year], MRN [id number]  PCP:  Fayrene Helper, MD  Cardiologist:   Kate Sable, MD  Chief Complaint  Patient presents with  . Hypertension  . Cardiomyopathy    History of Present Illness: [name] is a 46 y.o. male who presents for ongoing assessment and management of HTN, with hx of malignant hypertension, and hypertensive heart disease. Was last seen in the office on 4.25.2018.  She was to continue current regimen and have BP recordings done at home. Renal artery dopplers were considered if BP remained elevated. H  He was hospitalized on 05/25/2017 for recurrent chest pain and hypertension, and was found to have elevated troponin. [place] NM study was completed and found to be negative for ischemia. He was placed on amlodipine, and taken off of furosemide. Continued on coreg, clonidine, and hydralazine.   He comes today with complaints of LEE but otherwise is doing well. He states that he BP is elevated at home running in the 150/90 range. He is medically compliant.   Past Medical History:  Diagnosis Date  . Chronic back pain   . Duodenitis with bleeding 08/2008   Ulcer - admitted to [facility]   . Essential hypertension, benign   . GERD (gastroesophageal reflux disease)   . Headache(784.0)   . Hyperlipidemia   . Migraines   . Obesity, unspecified     Past Surgical History:  Procedure Laterality Date  . 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 FINGER DEBRIDEMENT Left 09/25/2013   Thumb  . FOOT FRACTURE SURGERY       Current Outpatient Prescriptions  Medication Sig Dispense Refill  . amLODipine (NORVASC) 10 MG tablet Take 1 tablet (10 mg total) by mouth daily. 30 tablet 2  . atorvastatin (LIPITOR) 40 MG tablet  Take 1 tablet (40 mg total) by mouth daily at 6 PM. 30 tablet 2  . cloNIDine (CATAPRES) 0.3 MG tablet Take 1 tablet (0.3 mg total) by mouth 2 (two) times daily. Half tablet in the morning and one tablet in the evening. Take medication 12 hours apart 60 tablet 2  . furosemide (LASIX) 20 MG tablet Take 20 mg by mouth 2 (two) times daily.    . hydrALAZINE (APRESOLINE) 100 MG tablet Take 1 tablet (100 mg total) by mouth 3 (three) times daily. 90 tablet 2  . hydrOXYzine (VISTARIL) 25 MG capsule Take 1 capsule (25 mg total) by mouth every 6 (six) hours as needed for nausea. 12 capsule 0  . nitroGLYCERIN (NITROSTAT) 0.4 MG SL tablet Place 1 tablet (0.4 mg total) under the tongue every 5 (five) minutes as needed for chest pain. 25 tablet 1  . carvedilol (COREG) 25 MG tablet Take 1 tablet (25 mg total) by mouth 2 (two) times daily. 180 tablet 3   No current facility-administered medications for this visit.     Allergies:   Bee venom; Prochlorperazine edisylate; Cyclobenzaprine hcl; Aleve [naproxen sodium]; Carbamazepine; Geodon [ziprasidone hcl]; Ibuprofen; Maxzide [hydrochlorothiazide w-triamterene]; Nsaids; Tylenol [acetaminophen]; and Other    Social History:  The patient  reports that he has been smoking Cigarettes.  He has been smoking about 0.10 packs per day. He has never used smokeless tobacco. He reports that he does not drink alcohol or use  drugs.   Family History:  The patient's family history includes Arthritis in his father; Diabetes in his father and mother; Hypertension in his father and mother.    ROS: All other systems are reviewed and negative. Unless otherwise mentioned in H&P    PHYSICAL EXAM: VS:  BP (!) 140/96   Pulse 80   Wt 247 lb (112 kg)   SpO2 98%   BMI 36.48 kg/m  , BMI Body mass index is 36.48 kg/m. GEN: Well nourished, well developed, in no acute distress  HEENT: normal  Neck: no JVD, carotid bruits, or masses Cardiac: RRR; no murmurs, rubs, or gallops,no edema    Respiratory:  clear to auscultation bilaterally, normal work of breathing GI: soft, nontender, nondistended, + BS MS: no deformity or atrophy  Skin: warm and dry, no rash Neuro:  Strength and sensation are intact Psych: euthymic mood, full affect   Recent Labs: 12/28/2016: Brain Natriuretic Peptide 6.2 05/21/2017: TSH 0.626 05/25/2017: Hemoglobin 12.4; Platelets 280 05/26/2017: BUN 30; Creatinine, Ser 2.37; Potassium 4.0; Sodium 137    Lipid Panel    Component Value Date/Time   CHOL 103 (L) 09/22/2015 0814   TRIG 63 09/22/2015 0814   HDL 36 (L) 09/22/2015 0814   CHOLHDL 2.9 09/22/2015 0814   VLDL 13 09/22/2015 0814   LDLCALC 54 09/22/2015 0814      Wt Readings from Last 3 Encounters:  06/08/17 247 lb (112 kg)  05/24/17 234 lb 2.1 oz (106.2 kg)  01/11/17 259 lb (117.5 kg)      Other studies Reviewed: Echocardiogram 06-13-2017 Left ventricle: The cavity size was at the upper limits of   normal. Wall thickness was increased in a pattern of severe LVH.   Systolic function was normal. The estimated ejection fraction was   in the range of 55% to 60%. Wall motion was normal; there were no   regional wall motion abnormalities. Doppler parameters are   consistent with abnormal left ventricular relaxation (grade 1   diastolic dysfunction). - Aortic valve: Probable Lambl&'s excrescence noted on parasternal   long axis view, left coronary cusp. Cannot completely exclude a   small vegetation in light of aortic regurgitation, however would   correlate this clinically with patient&'s presentation. There was   mild regurgitation. - Mitral valve: Mildly calcified annulus. There was trivial   regurgitation. - Right atrium: Central venous pressure (est): 3 mm Hg. - Tricuspid valve: There was trivial regurgitation. - Pericardium, extracardiac: A small pericardial effusion was   identified posterior to the heart.  NM Stress Test 05/25/2017  Study Result    There was no ST segment  deviation noted during stress.  The study is normal. There are no perfusion defects consistent with prior infarct or current ischemia  This is an intermediate risk study. Risk is based on decreased LVEF, there is no myocardium at jeopardy. Consider correlating LVEF with echo.  The left ventricular ejection fraction is moderately decreased (30-44%).    ASSESSMENT AND PLAN:  1. Malignant HTN:  BP is better controlled but not optimal. Will increase carvedilol to 25 mg BID. Continue clonidine and hydralazine. He will have renal artery ultrasound for evaluation of renal artery stenosis.   2.Grade I Diastolic Dysfunction: He continues on lasix 20 mg daily. No changes in regimen at this time.    Current medicines are reviewed at length with the patient today.    Labs/ tests ordered today include: Renal artery ultrasound.  Phill Myron. West Pugh, ANP, AACC  06/08/2017 4:15 PM     [facility] [address], [place], [place] [address] Phone: [phone]; Fax: [phone]

## 2017-06-08 ENCOUNTER — Ambulatory Visit (INDEPENDENT_AMBULATORY_CARE_PROVIDER_SITE_OTHER): Payer: BLUE CROSS/BLUE SHIELD | Admitting: Adult Health

## 2017-06-08 ENCOUNTER — Encounter: Payer: Self-pay | Admitting: Adult Health

## 2017-06-08 VITALS — BP 140/96 | HR 80 | Wt 247.0 lb

## 2017-06-08 DIAGNOSIS — I5032 Chronic diastolic (congestive) heart failure: Secondary | ICD-10-CM

## 2017-06-08 DIAGNOSIS — I10 Essential (primary) hypertension: Secondary | ICD-10-CM

## 2017-06-08 MED ORDER — CARVEDILOL 25 MG PO TABS: 25.0000 mg | ORAL_TABLET | Freq: Two times a day (BID) | ORAL | 3 refills | Status: DC

## 2017-06-08 NOTE — Patient Instructions (Signed)
Medication Instructions:  Your physician has recommended you make the following change in your medication:  Increase Coreg to 25 mg Two Times Daily    Labwork: NONE   Testing/Procedures: Your physician has requested that you have a renal artery duplex. During this test, an ultrasound is used to evaluate blood flow to the kidneys. Allow one hour for this exam. Do not eat after midnight the day before and avoid carbonated beverages. Take your medications as you usually do.    Follow-Up: Your physician recommends that you schedule a follow-up appointment in: 1 Month    Any Other Special Instructions Will Be Listed Below (If Applicable).     If you need a refill on your cardiac medications before your next appointment, please call your pharmacy.  Thank you for choosing [facility]!

## 2017-06-15 ENCOUNTER — Telehealth: Payer: Self-pay | Admitting: *Deleted

## 2017-06-15 ENCOUNTER — Ambulatory Visit: Payer: BLUE CROSS/BLUE SHIELD

## 2017-06-15 DIAGNOSIS — I10 Essential (primary) hypertension: Secondary | ICD-10-CM

## 2017-06-15 DIAGNOSIS — I130 Hypertensive heart and chronic kidney disease with heart failure and stage 1 through stage 4 chronic kidney disease, or unspecified chronic kidney disease: Secondary | ICD-10-CM | POA: Diagnosis not present

## 2017-06-15 DIAGNOSIS — N183 Chronic kidney disease, stage 3 (moderate): Secondary | ICD-10-CM | POA: Diagnosis not present

## 2017-06-15 NOTE — Telephone Encounter (Signed)
Called patient with test results. No answer. Left message to call back.  

## 2017-06-15 NOTE — Telephone Encounter (Signed)
-----   Message from Lendon Colonel, NP sent at 06/15/2017 11:13 AM EDT ----- No blocked arteries to the kidneys as reason for continued issues with BP control. Continue current regimen. Some damage to his kidney's from high BP. Very strict control is necessary.

## 2017-07-04 DIAGNOSIS — F419 Anxiety disorder, unspecified: Secondary | ICD-10-CM | POA: Diagnosis not present

## 2017-07-04 DIAGNOSIS — I10 Essential (primary) hypertension: Secondary | ICD-10-CM | POA: Diagnosis not present

## 2017-07-04 DIAGNOSIS — G43719 Chronic migraine without aura, intractable, without status migrainosus: Secondary | ICD-10-CM | POA: Diagnosis not present

## 2017-07-04 DIAGNOSIS — G4733 Obstructive sleep apnea (adult) (pediatric): Secondary | ICD-10-CM | POA: Diagnosis not present

## 2017-07-07 DIAGNOSIS — G4733 Obstructive sleep apnea (adult) (pediatric): Secondary | ICD-10-CM | POA: Diagnosis not present

## 2017-07-20 ENCOUNTER — Encounter: Payer: Self-pay | Admitting: Cardiovascular Disease

## 2017-07-20 ENCOUNTER — Ambulatory Visit: Payer: BLUE CROSS/BLUE SHIELD | Admitting: Cardiovascular Disease

## 2017-07-23 ENCOUNTER — Encounter (HOSPITAL_COMMUNITY): Payer: Self-pay

## 2017-07-23 ENCOUNTER — Other Ambulatory Visit: Payer: Self-pay

## 2017-07-23 ENCOUNTER — Emergency Department (HOSPITAL_COMMUNITY)
Admission: EM | Admit: 2017-07-23 | Discharge: 2017-07-23 | Disposition: A | Discharge status: Home / Self Care | Payer: BLUE CROSS/BLUE SHIELD | Attending: Emergency Medicine | Admitting: Emergency Medicine

## 2017-07-23 DIAGNOSIS — R112 Nausea with vomiting, unspecified: Secondary | ICD-10-CM | POA: Insufficient documentation

## 2017-07-23 DIAGNOSIS — I129 Hypertensive chronic kidney disease with stage 1 through stage 4 chronic kidney disease, or unspecified chronic kidney disease: Secondary | ICD-10-CM | POA: Diagnosis not present

## 2017-07-23 DIAGNOSIS — N183 Chronic kidney disease, stage 3 (moderate): Secondary | ICD-10-CM | POA: Insufficient documentation

## 2017-07-23 DIAGNOSIS — F17210 Nicotine dependence, cigarettes, uncomplicated: Secondary | ICD-10-CM | POA: Insufficient documentation

## 2017-07-23 LAB — CBC
HCT: 32.9 % — ABNORMAL LOW (ref 39.0–52.0)
Hemoglobin: 11.5 g/dL — ABNORMAL LOW (ref 13.0–17.0)
MCH: 32.4 pg (ref 26.0–34.0)
MCHC: 35 g/dL (ref 30.0–36.0)
MCV: 92.7 fL (ref 78.0–100.0)
PLATELETS: 259 10*3/uL (ref 150–400)
RBC: 3.55 MIL/uL — ABNORMAL LOW (ref 4.22–5.81)
RDW: 11.9 % (ref 11.5–15.5)
WBC: 7.2 10*3/uL (ref 4.0–10.5)

## 2017-07-23 LAB — COMPREHENSIVE METABOLIC PANEL
ALT: 14 U/L — AB (ref 17–63)
AST: 20 U/L (ref 15–41)
Albumin: 4.7 g/dL (ref 3.5–5.0)
Alkaline Phosphatase: 87 U/L (ref 38–126)
Anion gap: 10 (ref 5–15)
BILIRUBIN TOTAL: 0.7 mg/dL (ref 0.3–1.2)
BUN: 30 mg/dL — AB (ref 6–20)
CHLORIDE: 99 mmol/L — AB (ref 101–111)
CO2: 31 mmol/L (ref 22–32)
CREATININE: 2.95 mg/dL — AB (ref 0.61–1.24)
Calcium: 8.7 mg/dL — ABNORMAL LOW (ref 8.9–10.3)
GFR calc Af Amer: 27 mL/min — ABNORMAL LOW (ref >60)
GFR, EST NON AFRICAN AMERICAN: 24 mL/min — AB (ref >60)
GLUCOSE: 99 mg/dL (ref 65–99)
Potassium: 3.5 mmol/L (ref 3.5–5.1)
SODIUM: 140 mmol/L (ref 135–145)
Total Protein: 7.7 g/dL (ref 6.5–8.1)

## 2017-07-23 LAB — URINALYSIS, ROUTINE W REFLEX MICROSCOPIC
BACTERIA UA: NONE SEEN
Bilirubin Urine: NEGATIVE
Glucose, UA: NEGATIVE mg/dL
Hgb urine dipstick: NEGATIVE
Ketones, ur: NEGATIVE mg/dL
Nitrite: NEGATIVE
PH: 5 (ref 5.0–8.0)
Protein, ur: 100 mg/dL — AB
SPECIFIC GRAVITY, URINE: 1.017 (ref 1.005–1.030)

## 2017-07-23 LAB — LIPASE, BLOOD: Lipase: 45 U/L (ref 11–51)

## 2017-07-23 MED ORDER — ONDANSETRON 4 MG PO TBDP: 4.0000 mg | ORAL_TABLET | Freq: Three times a day (TID) | ORAL | 0 refills | Status: DC | PRN

## 2017-07-23 MED ORDER — SODIUM CHLORIDE 0.9 % IV BOLUS (SEPSIS)
2000.0000 mL | Freq: Once | INTRAVENOUS | Status: AC
  Administered 2017-07-23: 2000 mL via INTRAVENOUS

## 2017-07-23 MED ORDER — ONDANSETRON HCL 4 MG/2ML IJ SOLN
4.0000 mg | Freq: Once | INTRAMUSCULAR | Status: AC
  Administered 2017-07-23: 4 mg via INTRAVENOUS
  Filled 2017-07-23: qty 2

## 2017-07-23 NOTE — Discharge Instructions (Signed)
Clear liquids.  Slowly advance your diet as tolerated.  Follow with Dr. Moshe Cipro if not improving.  Zofran for nausea.

## 2017-07-23 NOTE — ED Provider Notes (Signed)
[facility] EMERGENCY DEPARTMENT Provider Note   CSN: [id number] Arrival date & time: 07/23/17  1202     History   Chief Complaint Chief Complaint  Patient presents with  . Nausea  . Emesis    HPI [name] is a 46 y.o. male. Chief complaint is nausea and vomiting since yesterday morning.  HPI 46 year old male. We congested morning with nausea. Some vomiting during the day. Was able to drink a little bit last night with clear liquids. Symptoms recurred this morning and presents here now again with vomiting. He was concerned because he has history of renal insufficiency. No diarrhea. No abdominal pain. No fever. No unusual dietary intake. No additional symptoms.  Past Medical History:  Diagnosis Date  . Chronic back pain   . Duodenitis with bleeding 08/2008   Ulcer - admitted to [facility]   . Essential hypertension, benign   . GERD (gastroesophageal reflux disease)   . Headache(784.0)   . Hyperlipidemia   . Migraines   . Obesity, unspecified     Patient Active Problem List   Diagnosis Date Noted  . Hypertensive cardiomyopathy ([place]) 05/21/2017  . Elevated troponin 05/21/2017  . Bilateral leg edema 12/26/2016  . Stress and adjustment reaction 11/04/2016  . Nausea without vomiting 09/12/2016  . Nonspecific abnormal electrocardiogram (ECG) (EKG) 08/30/2016  . Hyperlipidemia LDL goal <100 08/08/2015  . Sleep disorder 11/05/2014  . Obesity (BMI 30.0-34.9) 06/21/2014  . CKD (chronic kidney disease) stage 3, GFR 30-59 ml/min (HCC) 02/23/2014  . Bilateral renal cysts 02/23/2014  . Nocturia 02/19/2014  . Fatigue due to sleep pattern disturbance 10/20/2008  . Malignant hypertension 10/09/2007  . Migraine 10/09/2007    Past Surgical History:  Procedure Laterality Date  . FINGER DEBRIDEMENT Left 09/25/2013   Thumb  . FOOT FRACTURE SURGERY         Home Medications    Prior to Admission medications   Medication Sig Start Date End Date Taking?  Authorizing Provider  amLODipine (NORVASC) 10 MG tablet Take 1 tablet (10 mg total) by mouth daily. 05/26/17  Yes Isaac Bliss, Rayford Halsted, MD  atorvastatin (LIPITOR) 40 MG tablet Take 1 tablet (40 mg total) by mouth daily at 6 PM. 05/26/17  Yes Isaac Bliss, Rayford Halsted, MD  carvedilol (COREG) 25 MG tablet Take 1 tablet (25 mg total) by mouth 2 (two) times daily. 06/08/17 09/06/17 Yes Lendon Colonel, NP  cloNIDine (CATAPRES) 0.3 MG tablet Take 1 tablet (0.3 mg total) by mouth 2 (two) times daily. Half tablet in the morning and one tablet in the evening. Take medication 12 hours apart 05/26/17  Yes Isaac Bliss, Rayford Halsted, MD  furosemide (LASIX) 20 MG tablet Take 20 mg daily as needed by mouth for fluid.    Yes [provider]  hydrALAZINE (APRESOLINE) 100 MG tablet Take 1 tablet (100 mg total) by mouth 3 (three) times daily. 05/26/17  Yes Isaac Bliss, Rayford Halsted, MD  hydrOXYzine (VISTARIL) 25 MG capsule Take 1 capsule (25 mg total) by mouth every 6 (six) hours as needed for nausea. 12/25/16  Yes Lily Kocher, PA-C  nitroGLYCERIN (NITROSTAT) 0.4 MG SL tablet Place 1 tablet (0.4 mg total) under the tongue every 5 (five) minutes as needed for chest pain. 07/15/14  Yes Satira Sark, MD  oxyCODONE-acetaminophen (PERCOCET) 7.5-325 MG tablet Take 1 tablet daily as needed by mouth for pain. 07/04/17  Yes [provider]  ondansetron (ZOFRAN ODT) 4 MG disintegrating tablet Take 1 tablet (4 mg  total) every 8 (eight) hours as needed by mouth for nausea. 07/23/17   Tanna Furry, MD    Family History Family History  Problem Relation Age of Onset  . Diabetes Mother   . Hypertension Mother   . Hypertension Father   . Arthritis Father   . Diabetes Father     Social History Social History   Tobacco Use  . Smoking status: Current Some Day Smoker    Packs/day: 0.10    Types: Cigarettes    Last attempt to quit: 05/28/2012    Years since quitting: 5.1  . Smokeless tobacco: Never  Used  Substance Use Topics  . Alcohol use: No  . Drug use: No     Allergies   Bee venom; Prochlorperazine edisylate; Cyclobenzaprine hcl; Aleve [naproxen sodium]; Carbamazepine; Geodon [ziprasidone hcl]; Ibuprofen; Maxzide [hydrochlorothiazide w-triamterene]; Nsaids; Tylenol [acetaminophen]; and Other   Review of Systems Review of Systems  Constitutional: Negative for appetite change, chills, diaphoresis, fatigue and fever.  HENT: Negative for mouth sores, sore throat and trouble swallowing.   Eyes: Negative for visual disturbance.  Respiratory: Negative for cough, chest tightness, shortness of breath and wheezing.   Cardiovascular: Negative for chest pain.  Gastrointestinal: Positive for nausea and vomiting. Negative for abdominal distention, abdominal pain and diarrhea.  Endocrine: Negative for polydipsia, polyphagia and polyuria.  Genitourinary: Negative for dysuria, frequency and hematuria.  Musculoskeletal: Negative for gait problem.  Skin: Negative for color change, pallor and rash.  Neurological: Negative for dizziness, syncope, light-headedness and headaches.  Hematological: Does not bruise/bleed easily.  Psychiatric/Behavioral: Negative for behavioral problems and confusion.     Physical Exam Updated Vital Signs BP (!) 143/91 (BP Location: Left Arm)   Pulse 78   Temp 99 F (37.2 C) (Oral)   Resp 18   Ht 6' (1.829 m)   Wt 112 kg (247 lb)   SpO2 94%   BMI 33.50 kg/m   Physical Exam  Constitutional: He is oriented to person, place, and time. He appears well-developed and well-nourished. No distress.  HENT:  Head: Normocephalic.  Eyes: Conjunctivae are normal. Pupils are equal, round, and reactive to light. No scleral icterus.  Neck: Normal range of motion. Neck supple. No thyromegaly present.  Cardiovascular: Normal rate and regular rhythm. Exam reveals no gallop and no friction rub.  No murmur heard. Pulmonary/Chest: Effort normal and breath sounds normal. No  respiratory distress. He has no wheezes. He has no rales.  Abdominal: Soft. Bowel sounds are normal. He exhibits no distension. There is no tenderness. There is no rebound.  Soft benign abdomen. Normal active bowel sounds.  Musculoskeletal: Normal range of motion.  Neurological: He is alert and oriented to person, place, and time.  Skin: Skin is warm and dry. No rash noted.  Psychiatric: He has a normal mood and affect. His behavior is normal.     ED Treatments / Results  Labs (all labs ordered are listed, but only abnormal results are displayed) Labs Reviewed  COMPREHENSIVE METABOLIC PANEL - Abnormal; Notable for the following components:      Result Value   Chloride 99 (*)    BUN 30 (*)    Creatinine, Ser 2.95 (*)    Calcium 8.7 (*)    ALT 14 (*)    GFR calc non Af Amer 24 (*)    GFR calc Af Amer 27 (*)    All other components within normal limits  CBC - Abnormal; Notable for the following components:   RBC 3.55 (*)  Hemoglobin 11.5 (*)    HCT 32.9 (*)    All other components within normal limits  URINALYSIS, ROUTINE W REFLEX MICROSCOPIC - Abnormal; Notable for the following components:   Protein, ur 100 (*)    Leukocytes, UA TRACE (*)    Squamous Epithelial / LPF 0-5 (*)    All other components within normal limits  LIPASE, BLOOD    EKG  EKG Interpretation None       Radiology No results found.  Procedures Procedures (including critical care time)  Medications Ordered in ED Medications  sodium chloride 0.9 % bolus 2,000 mL (2,000 mLs Intravenous New Bag/Given 07/23/17 1346)  ondansetron (ZOFRAN) injection 4 mg (4 mg Intravenous Given 07/23/17 1347)     Initial Impression / Assessment and Plan / ED Course  I have reviewed the triage vital signs and the nursing notes.  Pertinent labs & imaging results that were available during my care of the patient were reviewed by me and considered in my medical decision making (see chart for details).    Creatinine  near baseline. Plan discharge home. Was given IV fluids here. Taking by mouth without difficulty. Primary care if not improving.  Final Clinical Impressions(s) / ED Diagnoses   Final diagnoses:  Nausea and vomiting, intractability of vomiting not specified, unspecified vomiting type    New Prescriptions This SmartLink is deprecated. Use AVSMEDLIST instead to display the medication list for a patient.   Tanna Furry, MD 07/23/17 [phone]

## 2017-07-23 NOTE — ED Notes (Signed)
Pt states emesis x 4 since early this morning.  Pt states vomited his pain medication for HA.

## 2017-07-23 NOTE — ED Triage Notes (Signed)
Reports of nausea and vomiting that started today. Denies pain.

## 2017-08-04 DIAGNOSIS — G4733 Obstructive sleep apnea (adult) (pediatric): Secondary | ICD-10-CM | POA: Diagnosis not present

## 2017-08-07 DIAGNOSIS — G4733 Obstructive sleep apnea (adult) (pediatric): Secondary | ICD-10-CM | POA: Diagnosis not present

## 2017-09-13 ENCOUNTER — Other Ambulatory Visit: Payer: Self-pay

## 2017-09-13 ENCOUNTER — Other Ambulatory Visit: Payer: Self-pay | Admitting: Family Medicine

## 2017-09-13 MED ORDER — AMLODIPINE BESYLATE 10 MG PO TABS: 10.0000 mg | ORAL_TABLET | Freq: Every day | ORAL | 0 refills | Status: DC

## 2017-09-13 NOTE — Telephone Encounter (Signed)
Seen 4 3 18

## 2017-09-14 ENCOUNTER — Other Ambulatory Visit: Payer: Self-pay | Admitting: Family Medicine

## 2017-09-15 ENCOUNTER — Other Ambulatory Visit: Payer: Self-pay | Admitting: Cardiovascular Disease

## 2017-09-15 MED ORDER — HYDRALAZINE HCL 100 MG PO TABS: 100.0000 mg | ORAL_TABLET | Freq: Three times a day (TID) | ORAL | 3 refills | Status: DC

## 2017-09-15 NOTE — Telephone Encounter (Signed)
Please call patient regarding Hydralazine and needing refills / tg

## 2017-09-15 NOTE — Telephone Encounter (Signed)
Patient requests 90 day supply hydralazine, e-scribed to [facility]

## 2017-09-25 DIAGNOSIS — Z79891 Long term (current) use of opiate analgesic: Secondary | ICD-10-CM | POA: Diagnosis not present

## 2017-09-25 DIAGNOSIS — G43719 Chronic migraine without aura, intractable, without status migrainosus: Secondary | ICD-10-CM | POA: Diagnosis not present

## 2017-09-25 DIAGNOSIS — I10 Essential (primary) hypertension: Secondary | ICD-10-CM | POA: Diagnosis not present

## 2017-09-25 DIAGNOSIS — G4733 Obstructive sleep apnea (adult) (pediatric): Secondary | ICD-10-CM | POA: Diagnosis not present

## 2017-10-23 ENCOUNTER — Encounter (HOSPITAL_COMMUNITY): Payer: Self-pay

## 2017-10-23 ENCOUNTER — Emergency Department (HOSPITAL_COMMUNITY): Payer: BLUE CROSS/BLUE SHIELD

## 2017-10-23 ENCOUNTER — Emergency Department (HOSPITAL_COMMUNITY)
Admission: EM | Admit: 2017-10-23 | Discharge: 2017-10-23 | Disposition: A | Discharge status: Home / Self Care | Payer: BLUE CROSS/BLUE SHIELD | Attending: Emergency Medicine | Admitting: Emergency Medicine

## 2017-10-23 DIAGNOSIS — I129 Hypertensive chronic kidney disease with stage 1 through stage 4 chronic kidney disease, or unspecified chronic kidney disease: Secondary | ICD-10-CM | POA: Diagnosis not present

## 2017-10-23 DIAGNOSIS — E876 Hypokalemia: Secondary | ICD-10-CM | POA: Insufficient documentation

## 2017-10-23 DIAGNOSIS — R109 Unspecified abdominal pain: Secondary | ICD-10-CM | POA: Diagnosis not present

## 2017-10-23 DIAGNOSIS — Z79899 Other long term (current) drug therapy: Secondary | ICD-10-CM | POA: Diagnosis not present

## 2017-10-23 DIAGNOSIS — N183 Chronic kidney disease, stage 3 (moderate): Secondary | ICD-10-CM | POA: Diagnosis not present

## 2017-10-23 DIAGNOSIS — R112 Nausea with vomiting, unspecified: Secondary | ICD-10-CM | POA: Diagnosis not present

## 2017-10-23 LAB — CBC WITH DIFFERENTIAL/PLATELET
Basophils Absolute: 0 10*3/uL (ref 0.0–0.1)
Basophils Relative: 0 %
Eosinophils Absolute: 0.3 10*3/uL (ref 0.0–0.7)
Eosinophils Relative: 5 %
HEMATOCRIT: 39 % (ref 39.0–52.0)
Hemoglobin: 13.1 g/dL (ref 13.0–17.0)
LYMPHS ABS: 1.5 10*3/uL (ref 0.7–4.0)
LYMPHS PCT: 29 %
MCH: 31.2 pg (ref 26.0–34.0)
MCHC: 33.6 g/dL (ref 30.0–36.0)
MCV: 92.9 fL (ref 78.0–100.0)
MONOS PCT: 6 %
Monocytes Absolute: 0.3 10*3/uL (ref 0.1–1.0)
NEUTROS ABS: 3.1 10*3/uL (ref 1.7–7.7)
Neutrophils Relative %: 60 %
Platelets: 310 10*3/uL (ref 150–400)
RBC: 4.2 MIL/uL — ABNORMAL LOW (ref 4.22–5.81)
RDW: 11.7 % (ref 11.5–15.5)
WBC: 5.2 10*3/uL (ref 4.0–10.5)

## 2017-10-23 LAB — BASIC METABOLIC PANEL
Anion gap: 15 (ref 5–15)
BUN: 20 mg/dL (ref 6–20)
CALCIUM: 9.1 mg/dL (ref 8.9–10.3)
CO2: 22 mmol/L (ref 22–32)
CREATININE: 2.5 mg/dL — AB (ref 0.61–1.24)
Chloride: 103 mmol/L (ref 101–111)
GFR calc Af Amer: 33 mL/min — ABNORMAL LOW (ref >60)
GFR calc non Af Amer: 29 mL/min — ABNORMAL LOW (ref >60)
GLUCOSE: 142 mg/dL — AB (ref 65–99)
Potassium: 3 mmol/L — ABNORMAL LOW (ref 3.5–5.1)
Sodium: 140 mmol/L (ref 135–145)

## 2017-10-23 LAB — TROPONIN I: Troponin I: <0.03 ng/mL (ref <0.03)

## 2017-10-23 LAB — CBG MONITORING, ED: Glucose-Capillary: 161 mg/dL — ABNORMAL HIGH (ref 65–99)

## 2017-10-23 MED ORDER — POTASSIUM CHLORIDE CRYS ER 20 MEQ PO TBCR: 20.0000 meq | EXTENDED_RELEASE_TABLET | Freq: Two times a day (BID) | ORAL | 0 refills | Status: DC

## 2017-10-23 MED ORDER — SUCRALFATE 1 GM/10ML PO SUSP: 1.0000 g | Freq: Three times a day (TID) | ORAL | 0 refills | Status: DC

## 2017-10-23 MED ORDER — DICYCLOMINE HCL 20 MG PO TABS: 20.0000 mg | ORAL_TABLET | Freq: Two times a day (BID) | ORAL | 0 refills | Status: DC

## 2017-10-23 MED ORDER — DICYCLOMINE HCL 10 MG PO CAPS
10.0000 mg | ORAL_CAPSULE | Freq: Once | ORAL | Status: AC
  Administered 2017-10-23: 10 mg via ORAL
  Filled 2017-10-23: qty 1

## 2017-10-23 MED ORDER — PROMETHAZINE HCL 25 MG PO TABS: 25.0000 mg | ORAL_TABLET | Freq: Four times a day (QID) | ORAL | 0 refills | Status: DC | PRN

## 2017-10-23 MED ORDER — SODIUM CHLORIDE 0.9 % IV BOLUS (SEPSIS)
1000.0000 mL | Freq: Once | INTRAVENOUS | Status: AC
  Administered 2017-10-23: 1000 mL via INTRAVENOUS

## 2017-10-23 MED ORDER — ONDANSETRON HCL 4 MG/2ML IJ SOLN
INTRAMUSCULAR | Status: AC
  Administered 2017-10-23: 4 mg
  Filled 2017-10-23: qty 2

## 2017-10-23 MED ORDER — PROMETHAZINE HCL 25 MG/ML IJ SOLN
12.5000 mg | Freq: Once | INTRAMUSCULAR | Status: AC
  Administered 2017-10-23: 12.5 mg via INTRAVENOUS
  Filled 2017-10-23: qty 1

## 2017-10-23 MED ORDER — METOCLOPRAMIDE HCL 5 MG/ML IJ SOLN
10.0000 mg | Freq: Once | INTRAMUSCULAR | Status: AC
  Administered 2017-10-23: 10 mg via INTRAVENOUS
  Filled 2017-10-23: qty 2

## 2017-10-23 MED ORDER — PANTOPRAZOLE SODIUM 20 MG PO TBEC: 20.0000 mg | DELAYED_RELEASE_TABLET | Freq: Every day | ORAL | 0 refills | Status: DC

## 2017-10-23 NOTE — ED Notes (Signed)
Pt tolerated po fluids well.

## 2017-10-23 NOTE — Discharge Instructions (Signed)
You have been seen in the Emergency Department (ED) today for nausea and vomiting.  Your work up today has not shown a clear cause for your symptoms. You have been prescribed Phenergan; please use as prescribed as needed for your nausea.  Your potassium was slightly low today, likely because of all the vomiting. Take the potassium pills provided and call your PCP tomorrow to discuss further testing and treatment.   Follow up with your doctor as soon as possible regarding today?s emergent visit and your symptoms of nausea.   Return to the ED if you develop abdominal, bloody vomiting, bloody diarrhea, if you are unable to tolerate fluids due to vomiting, or if you develop other symptoms that concern you.

## 2017-10-23 NOTE — ED Notes (Signed)
Pt very uncooperative.  Assisted out of car and pt would not attempt to stand up on his own, was falling forward onto the ground.  Will not answer questions, only keeps repeating he is cold and "to please help me."  Attempted to get pt to cooperate without success.  Vomiting into the floor with no attempt to hold emesis bag.

## 2017-10-23 NOTE — ED Notes (Signed)
Unable to obtain EKG at this time due to pt's uncooperation.

## 2017-10-23 NOTE — ED Triage Notes (Signed)
Pt reports that he started vomitng approx 8 am. Reports abd pain . No diarrhea

## 2017-10-23 NOTE — ED Provider Notes (Signed)
Emergency Department Provider Note   I have reviewed the triage vital signs and the nursing notes.   HISTORY  Chief Complaint Emesis   HPI [name] is a 47 y.o. male with PMH of HLD, elevated BMI, and GERD presents to the emergency department for evaluation of nausea and vomiting.  He has had similar episodes in the past that his significant other states is due to his elevated blood pressure.  He has been out of his blood pressure medications over the past several days and is waiting on refills.  He denies any chest pain.  The nausea and vomiting began last night and have persisted throughout the day today.  No diarrhea.  No hemoptysis.  No fevers or chills.  He has some cramping abdominal pain.  Denies history of gastroparesis.   Past Medical History:  Diagnosis Date  . Chronic back pain   . Duodenitis with bleeding 08/2008   Ulcer - admitted to [facility]   . Essential hypertension, benign   . GERD (gastroesophageal reflux disease)   . Headache(784.0)   . Hyperlipidemia   . Migraines   . Obesity, unspecified     Patient Active Problem List   Diagnosis Date Noted  . Hypertensive cardiomyopathy ([place]) 05/21/2017  . Elevated troponin 05/21/2017  . Bilateral leg edema 12/26/2016  . Stress and adjustment reaction 11/04/2016  . Nausea without vomiting 09/12/2016  . Nonspecific abnormal electrocardiogram (ECG) (EKG) 08/30/2016  . Hyperlipidemia LDL goal <100 08/08/2015  . Sleep disorder 11/05/2014  . Obesity (BMI 30.0-34.9) 06/21/2014  . CKD (chronic kidney disease) stage 3, GFR 30-59 ml/min (HCC) 02/23/2014  . Bilateral renal cysts 02/23/2014  . Nocturia 02/19/2014  . Fatigue due to sleep pattern disturbance 10/20/2008  . Malignant hypertension 10/09/2007  . Migraine 10/09/2007    Past Surgical History:  Procedure Laterality Date  . 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 FINGER DEBRIDEMENT Left 09/25/2013   Thumb  . FOOT FRACTURE SURGERY      Current Outpatient Rx  . Order #: [id number] Class: Normal  . Order #: [id number] Class: Print  . Order #: [id number] Class: Print  . Order #: [id number] Class: Normal  . Order #: [id number] Class: Print  . Order #: [id number] Class: Normal  . Order #: [id number] Class: Print  . Order #: [id number] Class: Historical Med  . Order #: [id number] Class: Normal  . Order #: [id number] Class: Print  . Order #: [id number] Class: Print  . Order #: [id number] Class: Print  . Order #: [id number] Class: Print  . Order #: [id number] Class: Print    Allergies Bee venom; Prochlorperazine edisylate; Cyclobenzaprine hcl; Aleve [naproxen sodium]; Carbamazepine; Geodon [ziprasidone hcl]; Ibuprofen; Maxzide [hydrochlorothiazide w-triamterene]; Nsaids; Tylenol [acetaminophen]; and Other  Family History  Problem Relation Age of Onset  . Diabetes Mother   . Hypertension Mother   . Hypertension Father   . Arthritis Father   . Diabetes Father     Social History Social History   Tobacco Use  . Smoking status: Current Some Day Smoker    Packs/day: 0.10    Types: Cigarettes    Last attempt to quit: 05/28/2012    Years since quitting: 5.4  . Smokeless tobacco: Never Used  Substance Use Topics  . Alcohol use: No  . Drug use: No    Review of Systems  Constitutional: No fever/chills Eyes: No visual changes.  ENT: No sore throat. Cardiovascular: Denies chest pain. Respiratory: Denies shortness of breath. Gastrointestinal: Positive cramping abdominal pain. Positive nausea and vomiting.  No diarrhea.  No constipation. Genitourinary: Negative for dysuria. Musculoskeletal: Negative for back pain. Skin: Negative for rash. Neurological: Negative for headaches, focal weakness or numbness.  10-point ROS otherwise  negative.  ____________________________________________   PHYSICAL EXAM:  VITAL SIGNS: ED Triage Vitals  Enc Vitals Group     BP 10/23/17 1037 (!) 198/98     Pulse Rate 10/23/17 1037 (!) 52     Resp 10/23/17 1037 (!) 24     Temp 10/23/17 1037 98.5 F (36.9 C)     Temp Source 10/23/17 1037 Oral     SpO2 10/23/17 1037 98 %     Weight 10/23/17 1034 247 lb (112 kg)     Pain Score 10/23/17 1034 10   Constitutional: Alert and oriented. Patient with dry heaves here. Laying on side and appears uncomfortable but can provide full history.  Eyes: Conjunctivae are normal.  Head: Atraumatic. Nose: No congestion/rhinnorhea. Mouth/Throat: Mucous membranes are moist. Neck: No stridor.  Cardiovascular: Normal rate, regular rhythm. Good peripheral circulation. Grossly normal heart sounds.   Respiratory: Normal respiratory effort.  No retractions. Lungs CTAB. Gastrointestinal: Soft and nontender. No distention.  Musculoskeletal: No lower extremity tenderness nor edema. No gross deformities of extremities. Neurologic:  Normal speech and language. No gross focal neurologic deficits are appreciated.  Skin:  Skin is warm, dry and intact. No rash noted.  ____________________________________________   LABS (all labs ordered are listed, but only abnormal results are displayed)  Labs Reviewed  CBC WITH DIFFERENTIAL/PLATELET - Abnormal; Notable for the following components:      Result Value   RBC 4.20 (*)    All other components within normal limits  BASIC METABOLIC PANEL - Abnormal; Notable for the following components:   Potassium 3.0 (*)    Glucose, Bld 142 (*)    Creatinine, Ser 2.50 (*)    GFR calc non Af Amer 29 (*)    GFR calc Af Amer 33 (*)    All other components within normal limits  CBG MONITORING, ED - Abnormal; Notable for the following components:   Glucose-Capillary 161 (*)    All other components within normal limits  TROPONIN I    ____________________________________________  EKG   EKG Interpretation  Date/Time:  Monday October 23 2017 11:47:55 EST Ventricular Rate:  61 PR Interval:    QRS Duration: 110 QT Interval:  435 QTC Calculation: 439 R Axis:   -68 Text Interpretation:  Sinus arrhythmia LAD, consider left anterior fascicular block Abnormal T, consider ischemia, diffuse leads Similar to prior. No STEMI.  Confirmed by Nanda Quinton [phone]) on 10/23/2017 11:51:32 AM       ____________________________________________  RADIOLOGY  Dg Abdomen Acute W/chest  Result Date: 10/23/2017 CLINICAL DATA:  Several days of dizziness. Has run out of blood pressure medicine. Onset of nausea and vomiting today. EXAM: DG ABDOMEN ACUTE W/ 1V CHEST COMPARISON:  Chest x-ray of August 31, 2016 FINDINGS: The lungs are adequately inflated. There no focal infiltrate. There is a sclerotic focus overlying the anterior aspect of the left fifth rib which is not entirely new. The heart and pulmonary vascularity are normal. The mediastinum is normal in width. Within the abdomen the bowel gas pattern is normal. There are phleboliths within the pelvis. The bony structures are unremarkable. IMPRESSION: There is no acute cardiopulmonary abnormality. There is no acute intra-abdominal abnormality either. There is  a sclerotic focus overlying the anterior aspect of the left fifth rib which is more conspicuous than in the past. A dedicated left rib series is recommended. Electronically Signed   By: David  Martinique M.D.   On: 10/23/2017 14:00    ____________________________________________   PROCEDURES  Procedure(s) performed:   Procedures  None ____________________________________________   INITIAL IMPRESSION / ASSESSMENT AND PLAN / ED COURSE  Pertinent labs & imaging results that were available during my care of the patient were reviewed by me and considered in my medical decision making (see chart for details).  Patient presents to  the emergency department for evaluation of nausea and vomiting that began last night and this morning.  Abdomen is completely soft and nontender.  Patient denies any chest pain or dyspnea.  No focal neuro deficits.  He has had emergency department visits with similar symptoms in the past but does not carry a diagnosis of gastroparesis.  Low suspicion for infectious etiology.  No indication for abdominal imaging at this time.  Plan for fluids, labs, EKG/troponin, reassess after symptom management. No findings on exam or by history to suspect HTN emergency.   03:13 PM Patient feeling better.  He required several rounds of nausea medication and plain film of the abdomen was ordered to rule out obstruction which was negative.  He is tolerating PO here. No HA symptoms to suspect intracranial etiology.  Kidney function appears to be near baseline.  He has mild hypokalemia.  Plan to supplement with oral potassium at home and have the patient follow-up with his primary care physician.  He has blood pressure medication at home which he will resume taking after he picks it up from the pharmacy today.   At this time, I do not feel there is any life-threatening condition present. I have reviewed and discussed all results (EKG, imaging, lab, urine as appropriate), exam findings with patient. I have reviewed nursing notes and appropriate previous records.  I feel the patient is safe to be discharged home without further emergent workup. Discussed usual and customary return precautions. Patient and family (if present) verbalize understanding and are comfortable with this plan.  Patient will follow-up with their primary care provider. If they do not have a primary care provider, information for follow-up has been provided to them. All questions have been answered.  ____________________________________________  FINAL CLINICAL IMPRESSION(S) / ED DIAGNOSES  Final diagnoses:  Non-intractable vomiting with nausea,  unspecified vomiting type  Hypokalemia     MEDICATIONS GIVEN DURING THIS VISIT:  Medications  ondansetron (ZOFRAN) 4 MG/2ML injection (4 mg  Given 10/23/17 1046)  metoCLOPramide (REGLAN) injection 10 mg (10 mg Intravenous Given 10/23/17 1120)  sodium chloride 0.9 % bolus 1,000 mL (0 mLs Intravenous Stopped 10/23/17 1435)  promethazine (PHENERGAN) injection 12.5 mg (12.5 mg Intravenous Given 10/23/17 1156)  dicyclomine (BENTYL) capsule 10 mg (10 mg Oral Given 10/23/17 1435)     NEW OUTPATIENT MEDICATIONS STARTED DURING THIS VISIT:  New Prescriptions   DICYCLOMINE (BENTYL) 20 MG TABLET    Take 1 tablet (20 mg total) by mouth 2 (two) times daily.   PANTOPRAZOLE (PROTONIX) 20 MG TABLET    Take 1 tablet (20 mg total) by mouth daily.   POTASSIUM CHLORIDE SA (K-DUR,KLOR-CON) 20 MEQ TABLET    Take 1 tablet (20 mEq total) by mouth 2 (two) times daily for 3 days.   PROMETHAZINE (PHENERGAN) 25 MG TABLET    Take 1 tablet (25 mg total) by mouth every 6 (six) hours as  needed for nausea or vomiting.   SUCRALFATE (CARAFATE) 1 GM/10ML SUSPENSION    Take 10 mLs (1 g total) by mouth 4 (four) times daily -  with meals and at bedtime.    Note:  This document was prepared using Dragon voice recognition software and may include unintentional dictation errors.  Nanda Quinton, MD Emergency Medicine    Reeta Kuk, [facility], MD 10/23/17 [phone]

## 2017-10-23 NOTE — ED Notes (Signed)
Checked on pt, who was using the urinal, and no vomiting noted.  Stepped back into room to empty it for him and he stated he was not really feeling better and began dry heaving.  Pulled covers over head to go back to sleep.

## 2017-10-24 ENCOUNTER — Telehealth: Payer: Self-pay | Admitting: Family Medicine

## 2017-10-24 ENCOUNTER — Ambulatory Visit: Payer: BLUE CROSS/BLUE SHIELD | Admitting: Family Medicine

## 2017-10-24 ENCOUNTER — Encounter: Payer: Self-pay | Admitting: Family Medicine

## 2017-10-24 VITALS — BP 140/100 | HR 87 | Resp 16 | Ht 71.0 in

## 2017-10-24 DIAGNOSIS — I119 Hypertensive heart disease without heart failure: Secondary | ICD-10-CM | POA: Diagnosis not present

## 2017-10-24 DIAGNOSIS — N183 Chronic kidney disease, stage 3 unspecified: Secondary | ICD-10-CM

## 2017-10-24 DIAGNOSIS — R112 Nausea with vomiting, unspecified: Secondary | ICD-10-CM | POA: Diagnosis not present

## 2017-10-24 DIAGNOSIS — I10 Essential (primary) hypertension: Secondary | ICD-10-CM

## 2017-10-24 DIAGNOSIS — I43 Cardiomyopathy in diseases classified elsewhere: Secondary | ICD-10-CM | POA: Diagnosis not present

## 2017-10-24 DIAGNOSIS — Z09 Encounter for follow-up examination after completed treatment for conditions other than malignant neoplasm: Secondary | ICD-10-CM

## 2017-10-24 DIAGNOSIS — E785 Hyperlipidemia, unspecified: Secondary | ICD-10-CM

## 2017-10-24 MED ORDER — AMLODIPINE BESYLATE 10 MG PO TABS: 10.0000 mg | ORAL_TABLET | Freq: Every day | ORAL | 3 refills | Status: DC

## 2017-10-24 NOTE — Patient Instructions (Addendum)
F/u in 3 months, call if you need me sooner  You need to take amlodipine, coreg, hydraklazine and clonidine as prescribed for your blood pressure  You are referred to nehrologist Dr Birdie Sons about your kidney disease, important that you go.  Use vaseline for the rash on your leg until the skin heals there is no bacterial infection there  You needs fasting lipid, cmp and eGFr , pSA  This week please

## 2017-10-24 NOTE — Telephone Encounter (Signed)
Form noted in my box AFTER visit.today Please call patient back patient to , measure his waist and fill this in on the form,  also document in his record, and he needs the fasting lipid, needs to  fast over 9 hrs, and the cholesterol value canm be entered once that is done , he may collect the form after it is copied, I have left it in your folder.  thanks!

## 2017-10-25 ENCOUNTER — Telehealth: Payer: Self-pay

## 2017-10-25 DIAGNOSIS — E785 Hyperlipidemia, unspecified: Secondary | ICD-10-CM

## 2017-10-25 DIAGNOSIS — I10 Essential (primary) hypertension: Secondary | ICD-10-CM

## 2017-10-25 DIAGNOSIS — Z125 Encounter for screening for malignant neoplasm of prostate: Secondary | ICD-10-CM

## 2017-10-25 NOTE — Addendum Note (Signed)
Addended by: Eual Fines on: 10/25/2017 08:26 AM   Modules accepted: Orders

## 2017-10-25 NOTE — Telephone Encounter (Signed)
Lab ordered.

## 2017-10-25 NOTE — Telephone Encounter (Signed)
Wife aware and will have him go this week

## 2017-11-01 ENCOUNTER — Encounter: Payer: Self-pay | Admitting: Family Medicine

## 2017-11-01 NOTE — Assessment & Plan Note (Signed)
Needs to return to cardiology for routine care will refer so he can be contacted for apt, has likely missed his follow up

## 2017-11-01 NOTE — Assessment & Plan Note (Signed)
Uncontrolled, non compliant with medication , medication list reviewed with patient and his wife. Medications he needs are prescribed, an appointment with cardiology for follow up is to be set up, and he shoulder f/u here in 3 montes also DASH diet and commitment to daily physical activity for a minimum of 30 minutes discussed and encouraged, as a part of hypertension management. The importance of attaining a healthy weight is also discussed.  BP/Weight 10/24/2017 10/23/2017 07/23/2017 06/08/2017 05/26/2017 05/24/2017 01/04/4080  Systolic BP 448 185 631 497 026 - 378  Diastolic BP 588 502 92 96 112 - 88  Wt. (Lbs) - 247 247 247 - 234.13 259  BMI 34.45 33.5 33.5 36.48 - 34.57 36.12

## 2017-11-01 NOTE — Assessment & Plan Note (Signed)
Pt needs to establish and be monitored and managed by nephrology, his wife is now aware of his need for follow through, states she will get [facility]

## 2017-11-01 NOTE — Progress Notes (Signed)
[name]     MRN: [id number]      DOB: 1971-04-06   HPI [name] is here for follow up from Ed visit 2 days ago when he presented with intractable vomiting Wife feels this was triggered by the  Fact that he has been out of his BP meds. He has not been taking his medica tins as prescribed when I review the record, and was last evaluated in cardiology 5 months ago, and nearly 1 year ago in this office  He has also been trying to get disability, and was admitted to the hospital with hypertensive urgency in  September 2018. Generally feels very weak, and has lost a lot of weight since last in this office  C/o itchy dark rash on right leg for weeks, improving, never had any pus draining, unclear as to what cause it  ROS Denies recent fever or chills.Feels weak Denies sinus pressure, nasal congestion, ear pain or sore throat. Denies chest congestion, productive cough or wheezing. Denies chest pains, palpitations and leg swelling    Denies dysuria, frequency, hesitancy or incontinence. Denies joint pain, swelling and limitation in mobility. chronic headaches, denies  Seizures, denies  numbness, or tingling.    PE  BP (!) 140/100   Pulse 87   Resp 16   Ht 5\' 11"  (1.803 m)   SpO2 94%   BMI 34.45 kg/m   Patient alert ill appearing, wearing dark glasses throughout entire visit, f;lat affect, no direct eye contact, wife present for the first time in  Years. Sitting in a wheelchair, slumped over the examining table with his head down most of the visit, felt ill, weak and nauseated HEENT: No facial asymmetry,  .  Neck supple no JVD, no mass.  Chest: Clear to auscultation bilaterally.  CVS: S1, S2 no murmurs, no S3.Regular rate.  ABD: Soft non tender.   Ext: No edema  MS: Adequate ROM spine, shoulders, hips and knees.  Skin: Intact,hyperpigmentd macular rash on lateral aspect of right leg diameter approximately cm, no erythema , skin breakdown or drainage  Psych: No direct  eye contact, flatl affect. Memory not assesed depressed appearing.  CNS: CN 2-12 intact, power,  normal throughout.no focal deficits noted.   Assessment & Plan  Nausea and vomiting Ongoing symptoms, pt to use phenergan prescribed in the ED fo symptom control. Needs to establish with nephrologist also  Malignant hypertension Uncontrolled, non compliant with medication , medication list reviewed with patient and his wife. Medications he needs are prescribed, an appointment with cardiology for follow up is to be set up, and he shoulder f/u here in 3 montes also DASH diet and commitment to daily physical activity for a minimum of 30 minutes discussed and encouraged, as a part of hypertension management. The importance of attaining a healthy weight is also discussed.  BP/Weight 10/24/2017 10/23/2017 07/23/2017 06/08/2017 05/26/2017 05/24/2017 4/00/8676  Systolic BP 195 093 267 124 580 - 998  Diastolic BP 338 250 92 96 112 - 88  Wt. (Lbs) - 247 247 247 - 234.13 259  BMI 34.45 33.5 33.5 36.48 - 34.57 36.12       Hypertensive cardiomyopathy (HCC) Needs to return to cardiology for routine care will refer so he can be contacted for apt, has likely missed his follow up  CKD (chronic kidney disease) stage 3, GFR 30-59 ml/min Pt needs to establish and be monitored and managed by nephrology, his wife is now aware of his need for follow through, states  she will get [facility]  Encounter for examination following treatment at hospital Evaluated in Ed for acute nausea and vomiting 2 days prior, still very symptomatic and weak with elevated blood pressure. Needs updated labs and to re engage in chronic disease management. Wife accompanies, he is to take medications as prescribed and is being referred to cardiology and to nephrology

## 2017-11-01 NOTE — Assessment & Plan Note (Signed)
Ongoing symptoms, pt to use phenergan prescribed in the ED fo symptom control. Needs to establish with nephrologist also

## 2017-11-01 NOTE — Assessment & Plan Note (Signed)
Evaluated in Ed for acute nausea and vomiting 2 days prior, still very symptomatic and weak with elevated blood pressure. Needs updated labs and to re engage in chronic disease management. Wife accompanies, he is to take medications as prescribed and is being referred to cardiology and to nephrology

## 2017-11-09 ENCOUNTER — Telehealth: Payer: Self-pay

## 2017-11-09 NOTE — Telephone Encounter (Signed)
[phone]  Please call pt regarding his blood work

## 2017-11-09 NOTE — Telephone Encounter (Signed)
Patient advised we cannot send his wellness form until the labs are done

## 2017-11-10 DIAGNOSIS — E785 Hyperlipidemia, unspecified: Secondary | ICD-10-CM | POA: Diagnosis not present

## 2017-11-10 DIAGNOSIS — Z125 Encounter for screening for malignant neoplasm of prostate: Secondary | ICD-10-CM | POA: Diagnosis not present

## 2017-11-10 DIAGNOSIS — I10 Essential (primary) hypertension: Secondary | ICD-10-CM | POA: Diagnosis not present

## 2017-11-11 LAB — LIPID PANEL
CHOL/HDL RATIO: 3.3 (calc) (ref <5.0)
Cholesterol: 134 mg/dL (ref <200)
HDL: 41 mg/dL (ref >40)
LDL CHOLESTEROL (CALC): 76 mg/dL
NON-HDL CHOLESTEROL (CALC): 93 mg/dL (ref <130)
TRIGLYCERIDES: 86 mg/dL (ref <150)

## 2017-11-11 LAB — COMPLETE METABOLIC PANEL WITH GFR
AG Ratio: 1.8 (calc) (ref 1.0–2.5)
ALT: 7 U/L — AB (ref 9–46)
AST: 12 U/L (ref 10–40)
Albumin: 4.2 g/dL (ref 3.6–5.1)
Alkaline phosphatase (APISO): 77 U/L (ref 40–115)
BUN/Creatinine Ratio: 8 (calc) (ref 6–22)
BUN: 21 mg/dL (ref 7–25)
CALCIUM: 8.9 mg/dL (ref 8.6–10.3)
CO2: 27 mmol/L (ref 20–32)
CREATININE: 2.66 mg/dL — AB (ref 0.60–1.35)
Chloride: 107 mmol/L (ref 98–110)
GFR, EST AFRICAN AMERICAN: 31 mL/min/{1.73_m2} — AB (ref >=60)
GFR, EST NON AFRICAN AMERICAN: 27 mL/min/{1.73_m2} — AB (ref >=60)
Globulin: 2.4 g/dL (calc) (ref 1.9–3.7)
Glucose, Bld: 89 mg/dL (ref 65–99)
POTASSIUM: 3.9 mmol/L (ref 3.5–5.3)
Sodium: 142 mmol/L (ref 135–146)
TOTAL PROTEIN: 6.6 g/dL (ref 6.1–8.1)
Total Bilirubin: 0.6 mg/dL (ref 0.2–1.2)

## 2017-11-11 LAB — PSA: PSA: 1.3 ng/mL (ref <=4.0)

## 2017-11-24 ENCOUNTER — Ambulatory Visit: Payer: BLUE CROSS/BLUE SHIELD | Admitting: Family Medicine

## 2017-11-27 ENCOUNTER — Other Ambulatory Visit: Payer: Self-pay

## 2017-11-27 MED ORDER — CLONIDINE HCL 0.3 MG PO TABS: ORAL_TABLET | ORAL | 5 refills | Status: DC

## 2017-12-28 DIAGNOSIS — G43719 Chronic migraine without aura, intractable, without status migrainosus: Secondary | ICD-10-CM | POA: Diagnosis not present

## 2017-12-28 DIAGNOSIS — I10 Essential (primary) hypertension: Secondary | ICD-10-CM | POA: Diagnosis not present

## 2017-12-28 DIAGNOSIS — Z79891 Long term (current) use of opiate analgesic: Secondary | ICD-10-CM | POA: Diagnosis not present

## 2017-12-28 DIAGNOSIS — G4733 Obstructive sleep apnea (adult) (pediatric): Secondary | ICD-10-CM | POA: Diagnosis not present

## 2018-01-01 ENCOUNTER — Ambulatory Visit (INDEPENDENT_AMBULATORY_CARE_PROVIDER_SITE_OTHER): Payer: BLUE CROSS/BLUE SHIELD | Admitting: Cardiovascular Disease

## 2018-01-01 ENCOUNTER — Encounter: Payer: Self-pay | Admitting: Cardiovascular Disease

## 2018-01-01 VITALS — BP 138/90 | HR 60 | Ht 71.0 in | Wt 226.0 lb

## 2018-01-01 DIAGNOSIS — N183 Chronic kidney disease, stage 3 unspecified: Secondary | ICD-10-CM

## 2018-01-01 DIAGNOSIS — I1310 Hypertensive heart and chronic kidney disease without heart failure, with stage 1 through stage 4 chronic kidney disease, or unspecified chronic kidney disease: Secondary | ICD-10-CM | POA: Diagnosis not present

## 2018-01-01 NOTE — Patient Instructions (Addendum)
Your physician wants you to follow-up in: 6 months with Dr.Koneswaran You will receive a reminder letter in the mail two months in advance. If you don't receive a letter, please call our office to schedule the follow-up appointment.   Your physician recommends that you continue on your current medications as directed. Please refer to the Current Medication list given to you today.    If you need a refill on your cardiac medications before your next appointment, please call your pharmacy.     No lab work or tests ordered today.     Thank you for choosing [facility] !         

## 2018-01-01 NOTE — Progress Notes (Signed)
SUBJECTIVE: The patient presents for follow-up of malignant hypertension and hypertensive heart disease. He was hospitalized in September 2018.  At that time he underwent a negative stress test. Echocardiogram at that time demonstrated normal left ventricular systolic function and regional wall motion, LVEF 55-60%, severe LVH, and grade 1 diastolic dysfunction.  There was mild aortic regurgitation and a small pericardial effusion.  Renal artery Dopplers were ordered in the fall 2018 but I do not see that they were performed.  He currently denies chest pain, palpitations, leg swelling, and shortness of breath.  He is no longer on Lasix.  I reviewed labs performed on 10/23/17: Potassium low at 3, BUN 20, creatinine 2.5.  He drinks about 2-3 cans of soda daily.  He is soon to establish with a nephrologist.   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    Review of Systems: As per "subjective", otherwise negative.  Allergies  Allergen Reactions  . Bee Venom Anaphylaxis  . Prochlorperazine Edisylate Other (See Comments)    Reaction: Jittery,uncomfortable feeling  . Cyclobenzaprine Hcl Other (See Comments)    REACTION: feel anxious, nausea  . Aleve [Naproxen Sodium] Nausea And Vomiting    States that this medication also causes abdominal pain  . Carbamazepine     REACTION: unknown reaction  . Geodon [Ziprasidone Hcl] Nausea And Vomiting  . Ibuprofen Nausea And Vomiting    States that this medication also causes abdominal pain  . Maxzide [Hydrochlorothiazide W-Triamterene]     headache  . Nsaids Other (See Comments)    H/o severe anemia from UGIB from ulcer  . Tylenol [Acetaminophen] Nausea And Vomiting    Patient states that this medication also causes abdominal pain  . Other Nausea And Vomiting, Rash and Other (See Comments)    ALL MUSCLE RELAXANTS: states that they affect sciatic nerve, may cause nausea and vomiting, rash      Current Outpatient Medications  Medication Sig Dispense Refill  . amLODipine (NORVASC) 10 MG tablet Take 1 tablet (10 mg total) by mouth daily. 30 tablet 3  . atorvastatin (LIPITOR) 40 MG tablet Take 1 tablet (40 mg total) by mouth daily at 6 PM. 30 tablet 2  . cloNIDine (CATAPRES) 0.3 MG tablet Half tablet in the morning and one tablet in the evening. Take medication 12 hours apart 45 tablet 5  . dicyclomine (BENTYL) 20 MG tablet Take 1 tablet (20 mg total) by mouth 2 (two) times daily. 20 tablet 0  . hydrALAZINE (APRESOLINE) 100 MG tablet Take 1 tablet (100 mg total) by mouth 3 (three) times daily. 270 tablet 3  . hydrOXYzine (VISTARIL) 25 MG capsule Take 1 capsule (25 mg total) by mouth every 6 (six) hours as needed for nausea. 12 capsule 0  . nitroGLYCERIN (NITROSTAT) 0.4 MG SL tablet Place 1 tablet (0.4 mg total) under the tongue every 5 (five) minutes as needed for chest pain. 25 tablet 1  . ondansetron (ZOFRAN ODT) 4 MG disintegrating tablet Take 1 tablet (4 mg total) every 8 (eight) hours as needed by mouth for nausea. 6 tablet 0  . oxyCODONE-acetaminophen (PERCOCET) 7.5-325 MG tablet Take 1 tablet daily as needed by mouth for pain.    . pantoprazole (PROTONIX) 20 MG tablet Take 1 tablet (20 mg total) by mouth daily. 30 tablet 0  . promethazine (PHENERGAN) 25 MG tablet Take 1 tablet (25 mg total) by mouth every 6 (six) hours as  needed for nausea or vomiting. 30 tablet 0  . sucralfate (CARAFATE) 1 GM/10ML suspension Take 10 mLs (1 g total) by mouth 4 (four) times daily -  with meals and at bedtime. 420 mL 0  . carvedilol (COREG) 25 MG tablet Take 1 tablet (25 mg total) by mouth 2 (two) times daily. (Patient not taking: Reported on 10/23/2017) 180 tablet 3  . potassium chloride SA (K-DUR,KLOR-CON) 20 MEQ tablet Take 1 tablet (20 mEq total) by mouth 2 (two) times daily for 3 days. 6 tablet 0   No current facility-administered medications for this visit.     Past Medical History:   Diagnosis Date  . Chronic back pain   . Duodenitis with bleeding 08/2008   Ulcer - admitted to [facility]   . Essential hypertension, benign   . GERD (gastroesophageal reflux disease)   . Headache(784.0)   . Hyperlipidemia   . Migraines   . Obesity, unspecified     Past Surgical History:  Procedure Laterality Date  . 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 FINGER DEBRIDEMENT Left 09/25/2013   Thumb  . FOOT FRACTURE SURGERY      Social History   Socioeconomic History  . Marital status: Married    Spouse name: Not on file  . Number of children: 4  . Years of education: Not on file  . Highest education level: Not on file  Occupational History  . Occupation: unemployed   Social Needs  . Financial resource strain: Not on file  . Food insecurity:    Worry: Not on file    Inability: Not on file  . Transportation needs:    Medical: Not on file    Non-medical: Not on file  Tobacco Use  . Smoking status: Former Smoker    Packs/day: 0.10    Types: Cigarettes    Last attempt to quit: 05/28/2012    Years since quitting: 5.6  . Smokeless tobacco: Never Used  Substance and Sexual Activity  . Alcohol use: No  . Drug use: No  . Sexual activity: Not on file  Lifestyle  . Physical activity:    Days per week: Not on file    Minutes per session: Not on file  . Stress: Not on file  Relationships  . Social connections:    Talks on phone: Not on file    Gets together: Not on file    Attends religious service: Not on file    Active member of club or organization: Not on file    Attends meetings of clubs or organizations: Not on file    Relationship status: Not on file  . Intimate partner violence:    Fear of current or ex partner: Not on file    Emotionally abused: Not on file    Physically abused: Not on  file    Forced sexual activity: Not on file  Other Topics Concern  . Not on file  Social History Narrative  . Not on file     Vitals:   01/01/18 1509  BP: 138/90  Pulse: 60  SpO2: 98%  Weight: 226 lb (102.5 kg)  Height: 5\' 11"  (1.803 m)    Wt Readings from Last 3 Encounters:  01/01/18 226 lb (102.5 kg)  10/23/17 247 lb (112 kg)  07/23/17 247 lb (112 kg)  PHYSICAL EXAM General: NAD HEENT: Normal. Neck: No JVD, no thyromegaly. Lungs: Clear to auscultation bilaterally with normal respiratory effort. CV: Regular rate and rhythm, normal S1/S2, no S3/S4, no murmur. No pretibial or periankle edema.  No carotid bruit.   Abdomen: Soft, nontender, no distention.  Neurologic: Alert and oriented.  Psych: Normal affect. Skin: Normal. Musculoskeletal: No gross deformities.    ECG: Most recent ECG reviewed.   Labs: Lab Results  Component Value Date/Time   K 3.9 11/10/2017 01:37 PM   BUN 21 11/10/2017 01:37 PM   CREATININE 2.66 (H) 11/10/2017 01:37 PM   ALT 7 (L) 11/10/2017 01:37 PM   TSH 0.626 05/21/2017 11:49 AM   TSH 0.93 10/26/2016 11:23 AM   HGB 13.1 10/23/2017 10:47 AM     Lipids: Lab Results  Component Value Date/Time   LDLCALC 76 11/10/2017 01:37 PM   CHOL 134 11/10/2017 01:37 PM   TRIG 86 11/10/2017 01:37 PM   HDL 41 11/10/2017 01:37 PM       ASSESSMENT AND PLAN: 1.  Malignant hypertension: Diastolic blood pressure is mildly elevated.  I talked to him about limiting soda intake.  This will need further monitoring.  He appears to be compliant with medications now.  2.  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 Will need to control blood pressure to avoid the need for dialysis in the future.   3.  Hypertensive heart disease: He appears euvolemic and is no longer on diuretics.   Disposition: Follow up 6 months   Kate Sable, M.D., F.A.C.C.

## 2018-01-05 ENCOUNTER — Ambulatory Visit: Payer: BLUE CROSS/BLUE SHIELD | Admitting: Cardiovascular Disease

## 2018-01-06 ENCOUNTER — Encounter (HOSPITAL_COMMUNITY): Payer: Self-pay | Admitting: Emergency Medicine

## 2018-01-06 ENCOUNTER — Observation Stay (HOSPITAL_COMMUNITY): Payer: BLUE CROSS/BLUE SHIELD

## 2018-01-06 ENCOUNTER — Inpatient Hospital Stay (HOSPITAL_COMMUNITY)
Admission: EM | Admit: 2018-01-06 | Discharge: 2018-01-08 | DRG: 305 | Disposition: A | Discharge status: Home / Self Care | Payer: BLUE CROSS/BLUE SHIELD | Attending: Internal Medicine | Admitting: Internal Medicine

## 2018-01-06 ENCOUNTER — Other Ambulatory Visit: Payer: Self-pay

## 2018-01-06 DIAGNOSIS — Z6831 Body mass index (BMI) 31.0-31.9, adult: Secondary | ICD-10-CM | POA: Diagnosis not present

## 2018-01-06 DIAGNOSIS — R5382 Chronic fatigue, unspecified: Secondary | ICD-10-CM | POA: Diagnosis present

## 2018-01-06 DIAGNOSIS — R112 Nausea with vomiting, unspecified: Secondary | ICD-10-CM | POA: Diagnosis not present

## 2018-01-06 DIAGNOSIS — K219 Gastro-esophageal reflux disease without esophagitis: Secondary | ICD-10-CM | POA: Diagnosis present

## 2018-01-06 DIAGNOSIS — I10 Essential (primary) hypertension: Secondary | ICD-10-CM | POA: Diagnosis not present

## 2018-01-06 DIAGNOSIS — E669 Obesity, unspecified: Secondary | ICD-10-CM | POA: Diagnosis not present

## 2018-01-06 DIAGNOSIS — Z886 Allergy status to analgesic agent status: Secondary | ICD-10-CM

## 2018-01-06 DIAGNOSIS — I43 Cardiomyopathy in diseases classified elsewhere: Secondary | ICD-10-CM | POA: Diagnosis not present

## 2018-01-06 DIAGNOSIS — I1310 Hypertensive heart and chronic kidney disease without heart failure, with stage 1 through stage 4 chronic kidney disease, or unspecified chronic kidney disease: Secondary | ICD-10-CM | POA: Diagnosis present

## 2018-01-06 DIAGNOSIS — N184 Chronic kidney disease, stage 4 (severe): Secondary | ICD-10-CM | POA: Diagnosis present

## 2018-01-06 DIAGNOSIS — I119 Hypertensive heart disease without heart failure: Secondary | ICD-10-CM | POA: Diagnosis present

## 2018-01-06 DIAGNOSIS — G4733 Obstructive sleep apnea (adult) (pediatric): Secondary | ICD-10-CM | POA: Diagnosis present

## 2018-01-06 DIAGNOSIS — Z91030 Bee allergy status: Secondary | ICD-10-CM

## 2018-01-06 DIAGNOSIS — G43911 Migraine, unspecified, intractable, with status migrainosus: Secondary | ICD-10-CM | POA: Diagnosis present

## 2018-01-06 DIAGNOSIS — I160 Hypertensive urgency: Principal | ICD-10-CM | POA: Diagnosis present

## 2018-01-06 DIAGNOSIS — E876 Hypokalemia: Secondary | ICD-10-CM | POA: Diagnosis not present

## 2018-01-06 DIAGNOSIS — G479 Sleep disorder, unspecified: Secondary | ICD-10-CM | POA: Diagnosis not present

## 2018-01-06 DIAGNOSIS — R5383 Other fatigue: Secondary | ICD-10-CM

## 2018-01-06 DIAGNOSIS — Z888 Allergy status to other drugs, medicaments and biological substances status: Secondary | ICD-10-CM

## 2018-01-06 DIAGNOSIS — E785 Hyperlipidemia, unspecified: Secondary | ICD-10-CM | POA: Diagnosis present

## 2018-01-06 DIAGNOSIS — Z79899 Other long term (current) drug therapy: Secondary | ICD-10-CM | POA: Diagnosis not present

## 2018-01-06 DIAGNOSIS — N183 Chronic kidney disease, stage 3 (moderate): Secondary | ICD-10-CM | POA: Diagnosis not present

## 2018-01-06 DIAGNOSIS — H53149 Visual discomfort, unspecified: Secondary | ICD-10-CM | POA: Diagnosis not present

## 2018-01-06 DIAGNOSIS — Z87891 Personal history of nicotine dependence: Secondary | ICD-10-CM | POA: Diagnosis not present

## 2018-01-06 DIAGNOSIS — R51 Headache: Secondary | ICD-10-CM

## 2018-01-06 DIAGNOSIS — G43809 Other migraine, not intractable, without status migrainosus: Secondary | ICD-10-CM | POA: Diagnosis not present

## 2018-01-06 DIAGNOSIS — R519 Headache, unspecified: Secondary | ICD-10-CM | POA: Diagnosis present

## 2018-01-06 DIAGNOSIS — Z8711 Personal history of peptic ulcer disease: Secondary | ICD-10-CM

## 2018-01-06 DIAGNOSIS — G43909 Migraine, unspecified, not intractable, without status migrainosus: Secondary | ICD-10-CM

## 2018-01-06 LAB — COMPREHENSIVE METABOLIC PANEL
ALT: 8 U/L — ABNORMAL LOW (ref 17–63)
AST: 15 U/L (ref 15–41)
Albumin: 4.1 g/dL (ref 3.5–5.0)
Alkaline Phosphatase: 69 U/L (ref 38–126)
Anion gap: 10 (ref 5–15)
BILIRUBIN TOTAL: 0.9 mg/dL (ref 0.3–1.2)
BUN: 24 mg/dL — ABNORMAL HIGH (ref 6–20)
CHLORIDE: 107 mmol/L (ref 101–111)
CO2: 25 mmol/L (ref 22–32)
Calcium: 8.7 mg/dL — ABNORMAL LOW (ref 8.9–10.3)
Creatinine, Ser: 2.52 mg/dL — ABNORMAL HIGH (ref 0.61–1.24)
GFR, EST AFRICAN AMERICAN: 33 mL/min — AB (ref >60)
GFR, EST NON AFRICAN AMERICAN: 28 mL/min — AB (ref >60)
Glucose, Bld: 111 mg/dL — ABNORMAL HIGH (ref 65–99)
POTASSIUM: 3.4 mmol/L — AB (ref 3.5–5.1)
Sodium: 142 mmol/L (ref 135–145)
TOTAL PROTEIN: 6.8 g/dL (ref 6.5–8.1)

## 2018-01-06 LAB — CBC WITH DIFFERENTIAL/PLATELET
BASOS ABS: 0 10*3/uL (ref 0.0–0.1)
Basophils Relative: 0 %
EOS ABS: 0 10*3/uL (ref 0.0–0.7)
Eosinophils Relative: 1 %
HCT: 35.1 % — ABNORMAL LOW (ref 39.0–52.0)
HEMOGLOBIN: 11.9 g/dL — AB (ref 13.0–17.0)
LYMPHS ABS: 0.5 10*3/uL — AB (ref 0.7–4.0)
LYMPHS PCT: 13 %
MCH: 32.2 pg (ref 26.0–34.0)
MCHC: 33.9 g/dL (ref 30.0–36.0)
MCV: 94.9 fL (ref 78.0–100.0)
Monocytes Absolute: 0.3 10*3/uL (ref 0.1–1.0)
Monocytes Relative: 7 %
NEUTROS PCT: 79 %
Neutro Abs: 2.9 10*3/uL (ref 1.7–7.7)
PLATELETS: 273 10*3/uL (ref 150–400)
RBC: 3.7 MIL/uL — AB (ref 4.22–5.81)
RDW: 12 % (ref 11.5–15.5)
WBC: 3.6 10*3/uL — AB (ref 4.0–10.5)

## 2018-01-06 LAB — URINALYSIS, ROUTINE W REFLEX MICROSCOPIC
Bilirubin Urine: NEGATIVE
GLUCOSE, UA: NEGATIVE mg/dL
Hgb urine dipstick: NEGATIVE
KETONES UR: NEGATIVE mg/dL
LEUKOCYTES UA: NEGATIVE
NITRITE: NEGATIVE
PROTEIN: NEGATIVE mg/dL
Specific Gravity, Urine: 1.016 (ref 1.005–1.030)
pH: 6 (ref 5.0–8.0)

## 2018-01-06 LAB — TSH: TSH: 1.177 u[IU]/mL (ref 0.350–4.500)

## 2018-01-06 LAB — TROPONIN I

## 2018-01-06 MED ORDER — HYDRALAZINE HCL 25 MG PO TABS
100.0000 mg | ORAL_TABLET | Freq: Three times a day (TID) | ORAL | Status: DC
Start: 2018-01-06 — End: 2018-01-08
  Administered 2018-01-06 – 2018-01-08 (×6): 100 mg via ORAL
  Filled 2018-01-06 (×6): qty 4

## 2018-01-06 MED ORDER — HYDROMORPHONE HCL 1 MG/ML IJ SOLN
0.2500 mg | INTRAMUSCULAR | Status: AC | PRN
  Administered 2018-01-06 – 2018-01-07 (×4): 0.5 mg via INTRAVENOUS
  Filled 2018-01-06 (×4): qty 0.5

## 2018-01-06 MED ORDER — SODIUM CHLORIDE 0.9 % IV SOLN: 250.0000 mL | INTRAVENOUS | Status: DC | PRN

## 2018-01-06 MED ORDER — CLONIDINE HCL 0.2 MG PO TABS: 0.2000 mg | ORAL_TABLET | Freq: Two times a day (BID) | ORAL | Status: DC

## 2018-01-06 MED ORDER — CLONIDINE HCL 0.2 MG PO TABS
0.2000 mg | ORAL_TABLET | Freq: Once | ORAL | Status: AC
  Administered 2018-01-06: 0.2 mg via ORAL
  Filled 2018-01-06: qty 1

## 2018-01-06 MED ORDER — PANTOPRAZOLE SODIUM 40 MG PO TBEC
40.0000 mg | DELAYED_RELEASE_TABLET | Freq: Every day | ORAL | Status: DC
  Administered 2018-01-07 – 2018-01-08 (×2): 40 mg via ORAL
  Filled 2018-01-06 (×2): qty 1

## 2018-01-06 MED ORDER — PROMETHAZINE HCL 12.5 MG PO TABS: 12.5000 mg | ORAL_TABLET | Freq: Three times a day (TID) | ORAL | Status: DC | PRN

## 2018-01-06 MED ORDER — ONDANSETRON 4 MG PO TBDP: 4.0000 mg | ORAL_TABLET | Freq: Three times a day (TID) | ORAL | Status: DC | PRN

## 2018-01-06 MED ORDER — ONDANSETRON HCL 4 MG PO TABS: 4.0000 mg | ORAL_TABLET | Freq: Four times a day (QID) | ORAL | Status: DC | PRN

## 2018-01-06 MED ORDER — OXYCODONE-ACETAMINOPHEN 7.5-325 MG PO TABS: 1.0000 | ORAL_TABLET | Freq: Four times a day (QID) | ORAL | Status: DC | PRN

## 2018-01-06 MED ORDER — NITROGLYCERIN 0.4 MG SL SUBL: 0.4000 mg | SUBLINGUAL_TABLET | SUBLINGUAL | Status: DC | PRN

## 2018-01-06 MED ORDER — CLONIDINE HCL 0.1 MG PO TABS
0.1000 mg | ORAL_TABLET | Freq: Every morning | ORAL | Status: DC
  Administered 2018-01-07: 0.1 mg via ORAL
  Filled 2018-01-06: qty 1

## 2018-01-06 MED ORDER — AMLODIPINE BESYLATE 5 MG PO TABS
10.0000 mg | ORAL_TABLET | Freq: Every day | ORAL | Status: DC
  Administered 2018-01-07 – 2018-01-08 (×2): 10 mg via ORAL
  Filled 2018-01-06 (×2): qty 2

## 2018-01-06 MED ORDER — PROMETHAZINE HCL 25 MG/ML IJ SOLN
25.0000 mg | Freq: Once | INTRAMUSCULAR | Status: AC
  Administered 2018-01-06: 25 mg via INTRAVENOUS
  Filled 2018-01-06: qty 1

## 2018-01-06 MED ORDER — DIPHENHYDRAMINE HCL 50 MG/ML IJ SOLN
12.5000 mg | Freq: Once | INTRAMUSCULAR | Status: AC
  Administered 2018-01-06: 12.5 mg via INTRAVENOUS
  Filled 2018-01-06: qty 1

## 2018-01-06 MED ORDER — METOPROLOL TARTRATE 5 MG/5ML IV SOLN: 2.5000 mg | Freq: Four times a day (QID) | INTRAVENOUS | Status: DC | PRN

## 2018-01-06 MED ORDER — ENOXAPARIN SODIUM 60 MG/0.6ML ~~LOC~~ SOLN
50.0000 mg | SUBCUTANEOUS | Status: DC
  Administered 2018-01-06 – 2018-01-07 (×2): 50 mg via SUBCUTANEOUS
  Filled 2018-01-06 (×2): qty 0.6

## 2018-01-06 MED ORDER — HYDRALAZINE HCL 20 MG/ML IJ SOLN
10.0000 mg | Freq: Once | INTRAMUSCULAR | Status: AC
  Administered 2018-01-06: 10 mg via INTRAVENOUS
  Filled 2018-01-06: qty 1

## 2018-01-06 MED ORDER — CLONIDINE HCL 0.2 MG PO TABS
0.2000 mg | ORAL_TABLET | Freq: Every day | ORAL | Status: DC
  Administered 2018-01-06: 0.2 mg via ORAL
  Filled 2018-01-06: qty 1

## 2018-01-06 MED ORDER — METOCLOPRAMIDE HCL 5 MG/ML IJ SOLN
10.0000 mg | Freq: Once | INTRAMUSCULAR | Status: AC
  Administered 2018-01-06: 10 mg via INTRAVENOUS
  Filled 2018-01-06: qty 2

## 2018-01-06 MED ORDER — SODIUM CHLORIDE 0.9% FLUSH
3.0000 mL | Freq: Two times a day (BID) | INTRAVENOUS | Status: DC
  Administered 2018-01-06 – 2018-01-07 (×4): 3 mL via INTRAVENOUS

## 2018-01-06 MED ORDER — CARVEDILOL 12.5 MG PO TABS
25.0000 mg | ORAL_TABLET | Freq: Two times a day (BID) | ORAL | Status: DC
  Administered 2018-01-06 – 2018-01-08 (×5): 25 mg via ORAL
  Filled 2018-01-06 (×5): qty 2

## 2018-01-06 MED ORDER — SENNOSIDES-DOCUSATE SODIUM 8.6-50 MG PO TABS
1.0000 | ORAL_TABLET | Freq: Every evening | ORAL | Status: DC | PRN
  Filled 2018-01-06: qty 1

## 2018-01-06 MED ORDER — ONDANSETRON HCL 4 MG/2ML IJ SOLN: 4.0000 mg | Freq: Four times a day (QID) | INTRAMUSCULAR | Status: DC | PRN

## 2018-01-06 MED ORDER — CLONIDINE HCL 0.2 MG PO TABS: 0.3000 mg | ORAL_TABLET | Freq: Two times a day (BID) | ORAL | Status: DC

## 2018-01-06 MED ORDER — FUROSEMIDE 10 MG/ML IJ SOLN
30.0000 mg | Freq: Once | INTRAMUSCULAR | Status: AC
  Administered 2018-01-06: 30 mg via INTRAVENOUS
  Filled 2018-01-06: qty 4

## 2018-01-06 MED ORDER — HYDRALAZINE HCL 20 MG/ML IJ SOLN
20.0000 mg | Freq: Once | INTRAMUSCULAR | Status: DC
Start: 2018-01-06 — End: 2018-01-06

## 2018-01-06 MED ORDER — OXYCODONE-ACETAMINOPHEN 7.5-325 MG PO TABS
1.0000 | ORAL_TABLET | Freq: Four times a day (QID) | ORAL | Status: DC | PRN
  Administered 2018-01-06 – 2018-01-08 (×3): 1 via ORAL
  Filled 2018-01-06 (×3): qty 1

## 2018-01-06 MED ORDER — HYDROXYZINE PAMOATE 25 MG PO CAPS
25.0000 mg | ORAL_CAPSULE | Freq: Four times a day (QID) | ORAL | Status: DC | PRN
Start: 2018-01-06 — End: 2018-01-08
  Filled 2018-01-06: qty 1

## 2018-01-06 MED ORDER — ATORVASTATIN CALCIUM 40 MG PO TABS
40.0000 mg | ORAL_TABLET | Freq: Every day | ORAL | Status: DC
  Administered 2018-01-06 – 2018-01-07 (×2): 40 mg via ORAL
  Filled 2018-01-06 (×2): qty 1

## 2018-01-06 MED ORDER — SODIUM CHLORIDE 0.9% FLUSH: 3.0000 mL | INTRAVENOUS | Status: DC | PRN

## 2018-01-06 MED ORDER — TRAZODONE HCL 50 MG PO TABS
25.0000 mg | ORAL_TABLET | Freq: Every evening | ORAL | Status: DC | PRN
  Administered 2018-01-06 – 2018-01-07 (×2): 25 mg via ORAL
  Filled 2018-01-06 (×2): qty 1

## 2018-01-06 MED ORDER — HYDRALAZINE HCL 20 MG/ML IJ SOLN
20.0000 mg | Freq: Once | INTRAMUSCULAR | Status: AC
  Administered 2018-01-06: 20 mg via INTRAVENOUS
  Filled 2018-01-06: qty 1

## 2018-01-06 MED ORDER — POTASSIUM CHLORIDE CRYS ER 20 MEQ PO TBCR
40.0000 meq | EXTENDED_RELEASE_TABLET | Freq: Two times a day (BID) | ORAL | Status: AC
  Administered 2018-01-06 (×2): 40 meq via ORAL
  Filled 2018-01-06 (×2): qty 2

## 2018-01-06 NOTE — H&P (Addendum)
History and Physical  [name] AST:[id number] DOB: October 17, 1970 DOA: 01/06/2018  Referring physician: Alvino Chapel  PCP: Fayrene Helper, MD   Chief Complaint: headache  HPI: [name] is a 47 y.o. male with poorly controlled hypertension, hypertensive cardiomyopathy, stage III CKD, GERD, migraine headaches, chronic headaches, hyperlipidemia, obesity and other history detailed below presented to the emergency department complaining of headache that started this morning.  He was noted to have elevated blood pressure this morning he apparently did take his blood pressure medications.  He is also having some nausea and vomiting and feels like his symptoms may be related to a migraine headache.  He does have a history of migraines.  He reports that he has 2 migraines per month and normally takes percocet tablets for them.  He has been seen in the emergency department and given additional blood pressure medications however his blood pressure has remained high.  It is slowly improving but he continues to have the headache and photophobia and nausea.  He is not having any focal neurological signs at this time.  He was given several treatments in the ED but continued to complain of headache.  He denies visual changes.  He denies chest pain and shortness of breath.  He denies fever and chills.  He reports positive symptoms of photophobia.  The ED physician requested patient be observed in the hospital overnight.  The patient says that he has not yet established care with a nephrologist for his chronic kidney disease.  He is followed by cardiology and has been stable from a cardiology standpoint.  Review of Systems: All systems reviewed and apart from history of presenting illness, are negative.  Past Medical History:  Diagnosis Date  . Chronic back pain   . Duodenitis with bleeding 08/2008   Ulcer - admitted to [facility]   . Essential hypertension, benign   . GERD (gastroesophageal  reflux disease)   . Headache(784.0)   . Hyperlipidemia   . Migraines   . Obesity, unspecified    Past Surgical History:  Procedure Laterality Date  . 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 FINGER DEBRIDEMENT Left 09/25/2013   Thumb  . FOOT FRACTURE SURGERY     Social History:  reports that he quit smoking about 5 years ago. His smoking use included cigarettes. He smoked 0.10 packs per day. He has never used smokeless tobacco. He reports that he does not drink alcohol or use drugs.  Allergies  Allergen Reactions  . Bee Venom Anaphylaxis  . Prochlorperazine Edisylate Other (See Comments)    Reaction: Jittery,uncomfortable feeling  . Cyclobenzaprine Hcl Other (See Comments)    REACTION: feel anxious, nausea  . Aleve [Naproxen Sodium] Nausea And Vomiting    States that this medication also causes abdominal pain  . Carbamazepine     REACTION: unknown reaction  . Geodon [Ziprasidone Hcl] Nausea And Vomiting  . Ibuprofen Nausea And Vomiting    States that this medication also causes abdominal pain  . Maxzide [Hydrochlorothiazide W-Triamterene]     headache  . Nsaids Other (See Comments)    H/o severe anemia from UGIB from ulcer  . Tylenol [Acetaminophen] Nausea And Vomiting    Patient states that this medication also causes abdominal pain  . Other Nausea And Vomiting, Rash and Other (See Comments)  ALL MUSCLE RELAXANTS: states that they affect sciatic nerve, may cause nausea and vomiting, rash    Family History  Problem Relation Age of Onset  . Diabetes Mother   . Hypertension Mother   . Hypertension Father   . Arthritis Father   . Diabetes Father     Prior to Admission medications   Medication Sig Start Date End Date Taking? Authorizing Provider  amLODipine (NORVASC) 10 MG tablet Take 1 tablet (10 mg  total) by mouth daily. 10/24/17  Yes Fayrene Helper, MD  atorvastatin (LIPITOR) 40 MG tablet Take 1 tablet (40 mg total) by mouth daily at 6 PM. 05/26/17  Yes Isaac Bliss, Rayford Halsted, MD  carvedilol (COREG) 25 MG tablet Take 1 tablet (25 mg total) by mouth 2 (two) times daily. 06/08/17 01/06/18 Yes Lendon Colonel, NP  cloNIDine (CATAPRES) 0.3 MG tablet Half tablet in the morning and one tablet in the evening. Take medication 12 hours apart 11/27/17  Yes Fayrene Helper, MD  hydrALAZINE (APRESOLINE) 100 MG tablet Take 1 tablet (100 mg total) by mouth 3 (three) times daily. 09/15/17  Yes Herminio Commons, MD  hydrOXYzine (VISTARIL) 25 MG capsule Take 1 capsule (25 mg total) by mouth every 6 (six) hours as needed for nausea. 12/25/16  Yes Lily Kocher, PA-C  ondansetron (ZOFRAN ODT) 4 MG disintegrating tablet Take 1 tablet (4 mg total) every 8 (eight) hours as needed by mouth for nausea. 07/23/17  Yes Tanna Furry, MD  oxyCODONE-acetaminophen (PERCOCET) 7.5-325 MG tablet Take 1 tablet daily as needed by mouth for pain. 07/04/17  Yes [provider]  promethazine (PHENERGAN) 25 MG tablet Take 1 tablet (25 mg total) by mouth every 6 (six) hours as needed for nausea or vomiting. 10/23/17  Yes Long, Wonda Olds, MD  dicyclomine (BENTYL) 20 MG tablet Take 1 tablet (20 mg total) by mouth 2 (two) times daily. 10/23/17   Long, Wonda Olds, MD  nitroGLYCERIN (NITROSTAT) 0.4 MG SL tablet Place 1 tablet (0.4 mg total) under the tongue every 5 (five) minutes as needed for chest pain. 07/15/14   Satira Sark, MD  pantoprazole (PROTONIX) 20 MG tablet Take 1 tablet (20 mg total) by mouth daily. 10/23/17   Long, Wonda Olds, MD  potassium chloride SA (K-DUR,KLOR-CON) 20 MEQ tablet Take 1 tablet (20 mEq total) by mouth 2 (two) times daily for 3 days. 10/23/17 10/26/17  Long, Wonda Olds, MD  sucralfate (CARAFATE) 1 GM/10ML suspension Take 10 mLs (1 g total) by mouth 4 (four) times daily -  with meals and at bedtime.  10/23/17   Margette Fast, MD   Physical Exam: Vitals:   01/06/18 1230 01/06/18 1300 01/06/18 1330 01/06/18 1400  BP: (!) 162/103 (!) 181/97 (!) 165/114 132/82  Pulse: (!) 55 61 61 66  Resp: 17 (!) 9 14   Temp:      TempSrc:      SpO2: 98% 98% 97% 97%  Weight:      Height:         General exam: Moderately built and nourished patient, lying comfortably supine on the gurney in no obvious distress.  Head, eyes and ENT: Nontraumatic and normocephalic. Pupils equally reacting to light and accommodation. Oral mucosa moist.  Neck: Supple. No JVD, carotid bruit or thyromegaly.  Lymphatics: No lymphadenopathy.  Respiratory system: Clear to auscultation. No increased work of breathing.  Cardiovascular system: S1 and S2 heard, RRR. No JVD, murmurs, gallops, clicks or pedal edema.  Gastrointestinal system:  Abdomen is nondistended, soft and nontender. Normal bowel sounds heard. No organomegaly or masses appreciated.  Central nervous system: Alert and oriented. No focal neurological deficits.  Extremities: Symmetric 5 x 5 power. Peripheral pulses symmetrically felt.   Skin: No rashes or acute findings.  Musculoskeletal system: Negative exam.  Psychiatry: Pleasant and cooperative.  Labs on Admission:  Basic Metabolic Panel: Recent Labs  Lab 01/06/18 1027  NA 142  K 3.4*  CL 107  CO2 25  GLUCOSE 111*  BUN 24*  CREATININE 2.52*  CALCIUM 8.7*   Liver Function Tests: Recent Labs  Lab 01/06/18 1027  AST 15  ALT 8*  ALKPHOS 69  BILITOT 0.9  PROT 6.8  ALBUMIN 4.1   No results for input(s): LIPASE, AMYLASE in the last 168 hours. No results for input(s): AMMONIA in the last 168 hours. CBC: Recent Labs  Lab 01/06/18 1027  WBC 3.6*  NEUTROABS 2.9  HGB 11.9*  HCT 35.1*  MCV 94.9  PLT 273   Cardiac Enzymes: Recent Labs  Lab 01/06/18 1027  TROPONINI <0.03    BNP (last 3 results) No results for input(s): PROBNP in the last 8760 hours. CBG: No results for  input(s): GLUCAP in the last 168 hours.  Radiological Exams on Admission: No results found.  EKG: Independently reviewed. NSR no acute ST-T abnormalities  Assessment/Plan Principal Problem:   Hypertensive urgency Active Problems:   Severe headache   Malignant hypertension   Fatigue due to sleep pattern disturbance   Migraines   CKD (chronic kidney disease) stage 3, GFR 30-59 ml/min (HCC)   Sleep disorder   Hyperlipidemia LDL goal <100   Hypertensive cardiomyopathy (HCC)   GERD (gastroesophageal reflux disease)   Hypokalemia  1. Hypertensive urgency- resume home blood pressure medications.  His blood pressure is already starting to come down with the medications given in the emergency department.  Would monitor vitals every 4 hours.  Check pulse ox with vital signs.  EKG does not reveal any acute findings.  There is no need to rapidly lower blood pressure at this time.  Would gradually lower blood pressure over next few hours.   Troponin <0.03.   2. Severe headache- suspect this is secondary to poorly controlled hypertension but given his history of migraines he could also have a migraine headache.  Supportive care ordered.  CT scan head ordered to rule out acute findings. 3. Migraine headache - He is reporting at least 2 debilitating headaches per month. I wonder if he could benefit from prophylactic therapy for migraines.  He should see his PCP to discuss this at some point in the near future.   Avoiding triptans right now until can get BP under control.  4. CKD stage III- creatinine stable from recent testing, patient is reportedly trying to establish care with a nephrologist for ongoing monitoring.  He was referred by his PCP. 5. GERD-Protonix ordered for GI protection. 6. Malignant hypertension- resumed on home blood pressure medications. 7. Hypokalemia- oral potassium replacement ordered.  Recheck in the morning. 8. Hyperlipidemia-resume home statin medication. 9. Chronic  fatigue-secondary to above.  He was counseled to work on improving sleep hygiene, get blood pressure consistently under control by not missing doses of medication, establish care with a nephrologist, follow cardiologist recommendations, establish care with a neurologist to get migraines under control.  In addition, will check Vit D levels given his stage 3 CKD, he may be very deficient. TSH pending.   ** Addendum: CT head results: no acute findings.  Pt notified.   DVT Prophylaxis: lovenox Code Status: Full   Family Communication: patient and wife Disposition Plan: home tomorrow   Time spent: 9 mins  Irwin Brakeman, MD [facility] Pager [phone]  If 7PM-7AM, please contact night-coverage [email or web address] Password [facility] 01/06/2018, 2:33 PM

## 2018-01-06 NOTE — ED Provider Notes (Signed)
[facility] EMERGENCY DEPARTMENT Provider Note   CSN: [id number] Arrival date & time: 01/06/18  1884     History   Chief Complaint Chief Complaint  Patient presents with  . Headache  . Hypertension    HPI [name] is a 47 y.o. male.  HPI Patient presents with headache and hypertension.  Headache feels as if his head could explode.  Has a history of both headaches with high blood pressure and migraines.  Has had nausea and vomiting.  Has not had fevers.  Has 3 different blood pressure medicines that he thinks he vomited up this morning.  No chest pain.  No trouble breathing.  Does have a history of renal insufficiency. Past Medical History:  Diagnosis Date  . Chronic back pain   . Duodenitis with bleeding 08/2008   Ulcer - admitted to [facility]   . Essential hypertension, benign   . GERD (gastroesophageal reflux disease)   . Headache(784.0)   . Hyperlipidemia   . Migraines   . Obesity, unspecified     Patient Active Problem List   Diagnosis Date Noted  . Hypertensive cardiomyopathy ([place]) 05/21/2017  . Elevated troponin 05/21/2017  . Bilateral leg edema 12/26/2016  . Stress and adjustment reaction 11/04/2016  . Nausea and vomiting 09/12/2016  . Nonspecific abnormal electrocardiogram (ECG) (EKG) 08/30/2016  . Encounter for examination following treatment at hospital 05/16/2016  . Hyperlipidemia LDL goal <100 08/08/2015  . Sleep disorder 11/05/2014  . Obesity (BMI 30.0-34.9) 06/21/2014  . CKD (chronic kidney disease) stage 3, GFR 30-59 ml/min (HCC) 02/23/2014  . Bilateral renal cysts 02/23/2014  . Nocturia 02/19/2014  . Fatigue due to sleep pattern disturbance 10/20/2008  . Malignant hypertension 10/09/2007  . Migraine 10/09/2007    Past Surgical History:  Procedure Laterality Date  . 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 FINGER DEBRIDEMENT Left 09/25/2013   Thumb  . FOOT FRACTURE SURGERY          Home Medications    Prior to Admission medications   Medication Sig Start Date End Date Taking? Authorizing Provider  amLODipine (NORVASC) 10 MG tablet Take 1 tablet (10 mg total) by mouth daily. 10/24/17  Yes Fayrene Helper, MD  atorvastatin (LIPITOR) 40 MG tablet Take 1 tablet (40 mg total) by mouth daily at 6 PM. 05/26/17  Yes Isaac Bliss, Rayford Halsted, MD  carvedilol (COREG) 25 MG tablet Take 1 tablet (25 mg total) by mouth 2 (two) times daily. 06/08/17 01/06/18 Yes Lendon Colonel, NP  cloNIDine (CATAPRES) 0.3 MG tablet Half tablet in the morning and one tablet in the evening. Take medication 12 hours apart 11/27/17  Yes Fayrene Helper, MD  hydrALAZINE (APRESOLINE) 100 MG tablet Take 1 tablet (100 mg total) by mouth 3 (three) times daily. 09/15/17  Yes Herminio Commons, MD  hydrOXYzine (VISTARIL) 25 MG capsule Take 1 capsule (25 mg total) by mouth every 6 (six) hours as needed for nausea. 12/25/16  Yes Lily Kocher, PA-C  ondansetron (ZOFRAN ODT) 4 MG disintegrating tablet Take 1 tablet (4 mg total) every 8 (eight) hours as needed by mouth for nausea. 07/23/17  Yes Tanna Furry, MD  oxyCODONE-acetaminophen (PERCOCET) 7.5-325 MG tablet Take 1 tablet daily as needed by mouth for pain. 07/04/17  Yes [provider]  promethazine (PHENERGAN) 25 MG  tablet Take 1 tablet (25 mg total) by mouth every 6 (six) hours as needed for nausea or vomiting. 10/23/17  Yes Long, Wonda Olds, MD  dicyclomine (BENTYL) 20 MG tablet Take 1 tablet (20 mg total) by mouth 2 (two) times daily. 10/23/17   Long, Wonda Olds, MD  nitroGLYCERIN (NITROSTAT) 0.4 MG SL tablet Place 1 tablet (0.4 mg total) under the tongue every 5 (five) minutes as needed for chest pain. 07/15/14   Satira Sark, MD  pantoprazole (PROTONIX) 20 MG tablet Take 1 tablet (20 mg total) by mouth daily. 10/23/17    Long, Wonda Olds, MD  potassium chloride SA (K-DUR,KLOR-CON) 20 MEQ tablet Take 1 tablet (20 mEq total) by mouth 2 (two) times daily for 3 days. 10/23/17 10/26/17  Long, Wonda Olds, MD  sucralfate (CARAFATE) 1 GM/10ML suspension Take 10 mLs (1 g total) by mouth 4 (four) times daily -  with meals and at bedtime. 10/23/17   Long, Wonda Olds, MD    Family History Family History  Problem Relation Age of Onset  . Diabetes Mother   . Hypertension Mother   . Hypertension Father   . Arthritis Father   . Diabetes Father     Social History Social History   Tobacco Use  . Smoking status: Former Smoker    Packs/day: 0.10    Types: Cigarettes    Last attempt to quit: 05/28/2012    Years since quitting: 5.6  . Smokeless tobacco: Never Used  Substance Use Topics  . Alcohol use: No  . Drug use: No     Allergies   Bee venom; Prochlorperazine edisylate; Cyclobenzaprine hcl; Aleve [naproxen sodium]; Carbamazepine; Geodon [ziprasidone hcl]; Ibuprofen; Maxzide [hydrochlorothiazide w-triamterene]; Nsaids; Tylenol [acetaminophen]; and Other   Review of Systems Review of Systems  Constitutional: Negative for appetite change and fever.  HENT: Negative for congestion.   Eyes: Positive for photophobia.  Respiratory: Negative for shortness of breath.   Cardiovascular: Negative for chest pain.  Gastrointestinal: Positive for nausea and vomiting. Negative for abdominal pain.  Genitourinary: Negative for flank pain.  Musculoskeletal: Negative for back pain.  Skin: Negative for rash.  Neurological: Positive for headaches. Negative for tremors.  Hematological: Negative for adenopathy.  Psychiatric/Behavioral: Negative for confusion.     Physical Exam Updated Vital Signs BP (!) 175/103   Pulse (!) 59   Temp 98.7 F (37.1 C) (Oral)   Resp 18   Ht 5\' 11"  (1.803 m)   Wt 102.5 kg (226 lb)   SpO2 98%   BMI 31.52 kg/m   Physical Exam  Constitutional: He is oriented to person, place, and time. He appears  well-developed.  HENT:  Head: Atraumatic.  Eyes:  Patient with photophobia.  Neck: Neck supple.  Cardiovascular: Normal rate.  Pulmonary/Chest: Effort normal.  Abdominal: Soft.  Neurological: He is alert and oriented to person, place, and time.  Skin: Skin is warm. Capillary refill takes less than 2 seconds.  Psychiatric: He has a normal mood and affect.     ED Treatments / Results  Labs (all labs ordered are listed, but only abnormal results are displayed) Labs Reviewed  COMPREHENSIVE METABOLIC PANEL - Abnormal; Notable for the following components:      Result Value   Potassium 3.4 (*)    Glucose, Bld 111 (*)    BUN 24 (*)    Creatinine, Ser 2.52 (*)    Calcium 8.7 (*)    ALT 8 (*)    GFR calc non Af Amer 28 (*)  GFR calc Af Amer 33 (*)    All other components within normal limits  CBC WITH DIFFERENTIAL/PLATELET - Abnormal; Notable for the following components:   WBC 3.6 (*)    RBC 3.70 (*)    Hemoglobin 11.9 (*)    HCT 35.1 (*)    Lymphs Abs 0.5 (*)    All other components within normal limits  TROPONIN I  URINALYSIS, ROUTINE W REFLEX MICROSCOPIC    EKG EKG Interpretation  Date/Time:  Saturday January 06 2018 10:09:11 EDT Ventricular Rate:  66 PR Interval:    QRS Duration: 101 QT Interval:  408 QTC Calculation: 428 R Axis:   -70 Text Interpretation:  Sinus rhythm Left anterior fascicular block Abnormal T, consider ischemia, diffuse leads No significant change since last tracing Confirmed by Davonna Belling [phone]) on 01/06/2018 10:12:19 AM   Radiology No results found.  Procedures Procedures (including critical care time)  Medications Ordered in ED Medications  metoCLOPramide (REGLAN) injection 10 mg (has no administration in time range)  cloNIDine (CATAPRES) tablet 0.2 mg (has no administration in time range)  hydrALAZINE (APRESOLINE) injection 20 mg (has no administration in time range)  promethazine (PHENERGAN) injection 25 mg (25 mg Intravenous  Given 01/06/18 1014)  diphenhydrAMINE (BENADRYL) injection 12.5 mg (12.5 mg Intravenous Given 01/06/18 1014)  hydrALAZINE (APRESOLINE) injection 10 mg (10 mg Intravenous Given 01/06/18 1142)     Initial Impression / Assessment and Plan / ED Course  I have reviewed the triage vital signs and the nursing notes.  Pertinent labs & imaging results that were available during my care of the patient were reviewed by me and considered in my medical decision making (see chart for details).     Patient presents with headache.  Has been having headache since this morning.  Has had nausea vomiting.  States that he has nausea and vomiting which is not unusual with his headaches.  Has a history of migraines.  States that he vomited up all his blood pressure medicines.  He has somewhat severe hypertension here.  Continued headache after treatment.  Had been given IV hydralazine with continued hypertension.  At this point I think with both the headache and hypertension patient would benefit from admission to the hospital.  Will re-dose hydralazine and will now add Reglan.  Has had CT scans for his headaches in the past with high blood pressure.  Has not shown pathology in the past.  EKG is baseline.  Creatinine is also elevated but at his baseline.  CRITICAL CARE Performed by: Davonna Belling Total critical care time: 30 minutes Critical care time was exclusive of separately billable procedures and treating other patients. Critical care was necessary to treat or prevent imminent or life-threatening deterioration. Critical care was time spent personally by me on the following activities: development of treatment plan with patient and/or surrogate as well as nursing, discussions with consultants, evaluation of patient's response to treatment, examination of patient, obtaining history from patient or surrogate, ordering and performing treatments and interventions, ordering and review of laboratory studies, ordering  and review of radiographic studies, pulse oximetry and re-evaluation of patient's condition. Repeat doses of IV antihypertensives.  Final Clinical Impressions(s) / ED Diagnoses   Final diagnoses:  Hypertension, unspecified type  Intractable migraine with status migrainosus, unspecified migraine type    ED Discharge Orders    None       Davonna Belling, MD 01/06/18 1348

## 2018-01-06 NOTE — ED Notes (Signed)
Pt states his headache is unchanged.

## 2018-01-06 NOTE — ED Triage Notes (Signed)
Patient complains of headache this morning. States that he feels his BP is high. Patient states he took his BP medication this morning which consisted of clonopin, amlodipine, and hydralazine. He now states nausea and vomiting.

## 2018-01-06 NOTE — ED Notes (Signed)
Pt transported to CT ?

## 2018-01-06 NOTE — ED Notes (Signed)
Pt sleeping at this time.

## 2018-01-07 DIAGNOSIS — I1310 Hypertensive heart and chronic kidney disease without heart failure, with stage 1 through stage 4 chronic kidney disease, or unspecified chronic kidney disease: Secondary | ICD-10-CM | POA: Diagnosis present

## 2018-01-07 DIAGNOSIS — I160 Hypertensive urgency: Secondary | ICD-10-CM | POA: Diagnosis not present

## 2018-01-07 DIAGNOSIS — R5382 Chronic fatigue, unspecified: Secondary | ICD-10-CM | POA: Diagnosis present

## 2018-01-07 DIAGNOSIS — Z6831 Body mass index (BMI) 31.0-31.9, adult: Secondary | ICD-10-CM | POA: Diagnosis not present

## 2018-01-07 DIAGNOSIS — I43 Cardiomyopathy in diseases classified elsewhere: Secondary | ICD-10-CM | POA: Diagnosis present

## 2018-01-07 DIAGNOSIS — E876 Hypokalemia: Secondary | ICD-10-CM | POA: Diagnosis present

## 2018-01-07 DIAGNOSIS — E669 Obesity, unspecified: Secondary | ICD-10-CM | POA: Diagnosis present

## 2018-01-07 DIAGNOSIS — E785 Hyperlipidemia, unspecified: Secondary | ICD-10-CM | POA: Diagnosis present

## 2018-01-07 DIAGNOSIS — N183 Chronic kidney disease, stage 3 (moderate): Secondary | ICD-10-CM | POA: Diagnosis not present

## 2018-01-07 DIAGNOSIS — R519 Headache, unspecified: Secondary | ICD-10-CM | POA: Diagnosis present

## 2018-01-07 DIAGNOSIS — Z8711 Personal history of peptic ulcer disease: Secondary | ICD-10-CM | POA: Diagnosis not present

## 2018-01-07 DIAGNOSIS — R51 Headache: Secondary | ICD-10-CM

## 2018-01-07 DIAGNOSIS — Z87891 Personal history of nicotine dependence: Secondary | ICD-10-CM | POA: Diagnosis not present

## 2018-01-07 DIAGNOSIS — Z79899 Other long term (current) drug therapy: Secondary | ICD-10-CM | POA: Diagnosis not present

## 2018-01-07 DIAGNOSIS — Z91030 Bee allergy status: Secondary | ICD-10-CM | POA: Diagnosis not present

## 2018-01-07 DIAGNOSIS — N184 Chronic kidney disease, stage 4 (severe): Secondary | ICD-10-CM | POA: Diagnosis present

## 2018-01-07 DIAGNOSIS — G479 Sleep disorder, unspecified: Secondary | ICD-10-CM | POA: Diagnosis present

## 2018-01-07 DIAGNOSIS — K219 Gastro-esophageal reflux disease without esophagitis: Secondary | ICD-10-CM | POA: Diagnosis present

## 2018-01-07 DIAGNOSIS — G43911 Migraine, unspecified, intractable, with status migrainosus: Secondary | ICD-10-CM | POA: Diagnosis present

## 2018-01-07 DIAGNOSIS — Z886 Allergy status to analgesic agent status: Secondary | ICD-10-CM | POA: Diagnosis not present

## 2018-01-07 DIAGNOSIS — Z888 Allergy status to other drugs, medicaments and biological substances status: Secondary | ICD-10-CM | POA: Diagnosis not present

## 2018-01-07 LAB — BASIC METABOLIC PANEL
Anion gap: 8 (ref 5–15)
BUN: 24 mg/dL — ABNORMAL HIGH (ref 6–20)
CHLORIDE: 105 mmol/L (ref 101–111)
CO2: 25 mmol/L (ref 22–32)
CREATININE: 3.13 mg/dL — AB (ref 0.61–1.24)
Calcium: 8.2 mg/dL — ABNORMAL LOW (ref 8.9–10.3)
GFR calc non Af Amer: 22 mL/min — ABNORMAL LOW (ref >60)
GFR, EST AFRICAN AMERICAN: 25 mL/min — AB (ref >60)
Glucose, Bld: 102 mg/dL — ABNORMAL HIGH (ref 65–99)
POTASSIUM: 3.6 mmol/L (ref 3.5–5.1)
SODIUM: 138 mmol/L (ref 135–145)

## 2018-01-07 LAB — MAGNESIUM: MAGNESIUM: 1.9 mg/dL (ref 1.7–2.4)

## 2018-01-07 MED ORDER — HYDROMORPHONE HCL 1 MG/ML IJ SOLN
0.5000 mg | INTRAMUSCULAR | Status: DC | PRN
  Administered 2018-01-07 – 2018-01-08 (×4): 0.5 mg via INTRAVENOUS
  Filled 2018-01-07 (×4): qty 0.5

## 2018-01-07 MED ORDER — CLONIDINE HCL 0.2 MG PO TABS
0.3000 mg | ORAL_TABLET | Freq: Two times a day (BID) | ORAL | Status: DC
  Administered 2018-01-07 – 2018-01-08 (×2): 0.3 mg via ORAL
  Filled 2018-01-07 (×2): qty 1

## 2018-01-07 MED ORDER — PROMETHAZINE HCL 25 MG/ML IJ SOLN
25.0000 mg | Freq: Once | INTRAMUSCULAR | Status: AC
  Administered 2018-01-07: 25 mg via INTRAVENOUS
  Filled 2018-01-07: qty 1

## 2018-01-07 MED ORDER — DIPHENHYDRAMINE HCL 50 MG/ML IJ SOLN
25.0000 mg | Freq: Once | INTRAMUSCULAR | Status: AC
  Administered 2018-01-07: 25 mg via INTRAVENOUS
  Filled 2018-01-07: qty 1

## 2018-01-07 NOTE — Progress Notes (Signed)
PROGRESS NOTE    [name]  QBH:[id number] DOB: 02-06-1971 DOA: 01/06/2018 PCP: Fayrene Helper, MD     Brief Narrative:  47 year old man admitted from home on 4/20 due to a headache.  He has a history of poorly controlled hypertension, hypertensive cardiomyopathy, stage III-IV chronic kidney disease and migraine headaches.  Admission has been requested.   Assessment & Plan:   Principal Problem:   Hypertensive urgency Active Problems:   Malignant hypertension   Fatigue due to sleep pattern disturbance   Migraines   CKD (chronic kidney disease) stage 3, GFR 30-59 ml/min (HCC)   Sleep disorder   Hyperlipidemia LDL goal <100   Hypertensive cardiomyopathy (HCC)   Severe headache   GERD (gastroesophageal reflux disease)   Hypokalemia   Hypertensive urgency -Systolic blood pressure remains around 160, still with headache today. -Have discussed with patient extreme importance of good blood pressure control especially as relates to his hypertensive cardiomyopathy and advancing chronic kidney disease. -He is on maximum doses of amlodipine, hydralazine, Coreg. -He is also on 0.1 mg and 0.2 mg of clonidine morning and night respectively. -We will increase clonidine to 0.3 mg twice daily. -Wife states that he has been consistent in taking his blood pressure medications. -No ACE inhibitor/ARB for now in the presence of progressing kidney disease.  Headache -He has a history of migraine headaches, I wonder how much of his headaches are related to his hypertension. -We will give combination of Benadryl and Phenergan once and see if this helps resolve his headache, takes Percocet at home prescribed by his neurologist. -He cannot take NSAIDs due to prior history of bleeding duodenal and gastric ulcers.  Stage III-IV chronic kidney disease -Due to poorly controlled hypertension -Stressed to patient importance of good blood pressure control to avoid progression of kidney  disease. -He has been referred to see nephrology outpatient however has not gotten around to making this appointment yet. -Have convinced him to make appointment in the next 1-2 weeks. -Continue to follow renal function.  GERD -Continue PPI  Hyperlipidemia -Continue statin  Hypokalemia -Replaced, magnesium is normal at 1.9.   DVT prophylaxis: Lovenox Code Status: Full code Family Communication: Wife at bedside updated on plan of care and all questions answered Disposition Plan: Anticipate discharge home in 24 hours  Consultants:   None  Procedures:   None  Antimicrobials:  Anti-infectives (From admission, onward)   None       Subjective: Complains of significant headache, no visual aura  Objective: Vitals:   01/07/18 0406 01/07/18 0824 01/07/18 1203 01/07/18 1531  BP: (!) 145/93 (!) 165/111 130/87 (!) 133/96  Pulse: 62 62 (!) 58 (!) 57  Resp: 18     Temp: 99.6 F (37.6 C) 99.1 F (37.3 C) 98.4 F (36.9 C)   TempSrc: Oral Oral Oral   SpO2: 98% 99% 99% 99%  Weight:      Height:        Intake/Output Summary (Last 24 hours) at 01/07/2018 1654 Last data filed at 01/07/2018 1300 Gross per 24 hour  Intake 603 ml  Output 2 ml  Net 601 ml   Filed Weights   01/06/18 0946 01/06/18 1555 01/06/18 1559  Weight: 102.5 kg (226 lb) 102.5 kg (226 lb) 102.5 kg (226 lb)    Examination:  General exam: Alert, awake, oriented x 3 Respiratory system: Clear to auscultation. Respiratory effort normal. Cardiovascular system:RRR. No murmurs, rubs, gallops. Gastrointestinal system: Abdomen is nondistended, soft and nontender. No organomegaly or masses  felt. Normal bowel sounds heard. Central nervous system: Alert and oriented. No focal neurological deficits. Extremities: No C/C/E, +pedal pulses Skin: No rashes, lesions or ulcers Psychiatry: Judgement and insight appear normal. Mood & affect appropriate.     Data Reviewed: I have personally reviewed following labs and  imaging studies  CBC: Recent Labs  Lab 01/06/18 1027  WBC 3.6*  NEUTROABS 2.9  HGB 11.9*  HCT 35.1*  MCV 94.9  PLT 268   Basic Metabolic Panel: Recent Labs  Lab 01/06/18 1027 01/07/18 0604  NA 142 138  K 3.4* 3.6  CL 107 105  CO2 25 25  GLUCOSE 111* 102*  BUN 24* 24*  CREATININE 2.52* 3.13*  CALCIUM 8.7* 8.2*  MG  --  1.9   GFR: Estimated Creatinine Clearance: 34.8 mL/min (A) (by C-G formula based on SCr of 3.13 mg/dL (H)). Liver Function Tests: Recent Labs  Lab 01/06/18 1027  AST 15  ALT 8*  ALKPHOS 69  BILITOT 0.9  PROT 6.8  ALBUMIN 4.1   No results for input(s): LIPASE, AMYLASE in the last 168 hours. No results for input(s): AMMONIA in the last 168 hours. Coagulation Profile: No results for input(s): INR, PROTIME in the last 168 hours. Cardiac Enzymes: Recent Labs  Lab 01/06/18 1027  TROPONINI <0.03   BNP (last 3 results) No results for input(s): PROBNP in the last 8760 hours. HbA1C: No results for input(s): HGBA1C in the last 72 hours. CBG: No results for input(s): GLUCAP in the last 168 hours. Lipid Profile: No results for input(s): CHOL, HDL, LDLCALC, TRIG, CHOLHDL, LDLDIRECT in the last 72 hours. Thyroid Function Tests: Recent Labs    01/06/18 1027  TSH 1.177   Anemia Panel: No results for input(s): VITAMINB12, FOLATE, FERRITIN, TIBC, IRON, RETICCTPCT in the last 72 hours. Urine analysis:    Component Value Date/Time   COLORURINE YELLOW 01/06/2018 1200   APPEARANCEUR CLEAR 01/06/2018 1200   LABSPEC 1.016 01/06/2018 1200   PHURINE 6.0 01/06/2018 1200   GLUCOSEU NEGATIVE 01/06/2018 1200   HGBUR NEGATIVE 01/06/2018 1200   BILIRUBINUR NEGATIVE 01/06/2018 1200   BILIRUBINUR small 04/07/2011 1343   KETONESUR NEGATIVE 01/06/2018 1200   PROTEINUR NEGATIVE 01/06/2018 1200   UROBILINOGEN 0.2 e.u/dL 04/07/2011 1343   NITRITE NEGATIVE 01/06/2018 1200   LEUKOCYTESUR NEGATIVE 01/06/2018 1200   Sepsis  Labs: @LABRCNTIP (procalcitonin:4,lacticidven:4)  )No results found for this or any previous visit (from the past 240 hour(s)).       Radiology Studies: Ct Head Wo Contrast  Result Date: 01/06/2018 CLINICAL DATA:  Headache.  Hypertension.  Vomited this morning. EXAM: CT HEAD WITHOUT CONTRAST TECHNIQUE: Contiguous axial images were obtained from the base of the skull through the vertex without intravenous contrast. COMPARISON:  05/21/2017. FINDINGS: Brain: Normal appearing cerebral hemispheres and posterior fossa structures. Normal size and position of the ventricles. No intracranial hemorrhage, mass lesion or CT evidence of acute infarction. Vascular: No hyperdense vessel or unexpected calcification. Skull: Normal. Negative for fracture or focal lesion. Sinuses/Orbits: Stable left maxillary sinus retention cyst. Unremarkable orbits. Other: None. IMPRESSION: No acute abnormality. Electronically Signed   By: Claudie Revering M.D.   On: 01/06/2018 15:07        Scheduled Meds: . amLODipine  10 mg Oral Daily  . atorvastatin  40 mg Oral q1800  . carvedilol  25 mg Oral BID  . cloNIDine  0.3 mg Oral BID  . enoxaparin (LOVENOX) injection  50 mg Subcutaneous Q24H  . hydrALAZINE  100 mg Oral TID  .  pantoprazole  40 mg Oral Daily  . sodium chloride flush  3 mL Intravenous Q12H   Continuous Infusions: . sodium chloride       LOS: 0 days    Time spent: 35 minutes. Greater than 50% of this time was spent in direct contact with the patient and with patient's wife, coordinating care and discussing relevant ongoing clinical issues, including short and long-term complications of uncontrolled hypertension as is pertinent to his hypertensive cardiomyopathy and advancing chronic kidney disease.     Lelon Frohlich, MD [facility] Pager [phone]  If 7PM-7AM, please contact night-coverage [email or web address] Password [facility] 01/07/2018, 4:54 PM

## 2018-01-07 NOTE — Progress Notes (Signed)
Patient continues to complain of a severe headache.  Neuro checks are WNL.

## 2018-01-08 LAB — VITAMIN D 25 HYDROXY (VIT D DEFICIENCY, FRACTURES): Vit D, 25-Hydroxy: 15.7 ng/mL — ABNORMAL LOW (ref 30.0–100.0)

## 2018-01-08 MED ORDER — ATORVASTATIN CALCIUM 40 MG PO TABS
40.0000 mg | ORAL_TABLET | Freq: Every day | ORAL | 2 refills | Status: DC
Start: 2018-01-08 — End: 2018-07-30

## 2018-01-08 MED ORDER — CLONIDINE HCL 0.3 MG PO TABS: 0.3000 mg | ORAL_TABLET | Freq: Two times a day (BID) | ORAL | 2 refills | Status: DC

## 2018-01-08 MED ORDER — AMLODIPINE BESYLATE 10 MG PO TABS: 10.0000 mg | ORAL_TABLET | Freq: Every day | ORAL | 3 refills | Status: DC

## 2018-01-08 MED ORDER — CARVEDILOL 25 MG PO TABS: 25.0000 mg | ORAL_TABLET | Freq: Two times a day (BID) | ORAL | 2 refills | Status: DC

## 2018-01-08 MED ORDER — HYDRALAZINE HCL 100 MG PO TABS: 100.0000 mg | ORAL_TABLET | Freq: Three times a day (TID) | ORAL | 2 refills | Status: DC

## 2018-01-08 NOTE — Discharge Summary (Signed)
Physician Discharge Summary  [name]:[id number] DOB: [birth year] DOA: 01/06/2018  PCP: Fayrene Helper, MD  Admit date: 01/06/2018 Discharge date: 01/08/2018  Time spent: 45 minutes  Recommendations for Outpatient Follow-up:  -Will be discharged home today. -Advised to follow up with PCP in 2 weeks for BP check. -Clonidine dose has been increased to 0.3 mg BID. -Also needs follow up with Dr. Lowanda Foster for Stage III-IV CKD.  Discharge Diagnoses:  Principal Problem:   Hypertensive urgency Active Problems:   Malignant hypertension   Fatigue due to sleep pattern disturbance   Migraines   CKD (chronic kidney disease) stage 3, GFR 30-59 ml/min (HCC)   Sleep disorder   Hyperlipidemia LDL goal <100   Hypertensive cardiomyopathy (HCC)   Severe headache   GERD (gastroesophageal reflux disease)   Hypokalemia   Headache   Discharge Condition: Stable and improved  Filed Weights   01/06/18 0946 01/06/18 1555 01/06/18 1559  Weight: 102.5 kg (226 lb) 102.5 kg (226 lb) 102.5 kg (226 lb)    History of present illness:  As per Dr. Wynetta Emery on 4/20: [name] is a 47 y.o. male with poorly controlled hypertension, hypertensive cardiomyopathy, stage III CKD, GERD, migraine headaches, chronic headaches, hyperlipidemia, obesity and other history detailed below presented to the emergency department complaining of headache that started this morning.  He was noted to have elevated blood pressure this morning he apparently did take his blood pressure medications.  He is also having some nausea and vomiting and feels like his symptoms may be related to a migraine headache.  He does have a history of migraines.  He reports that he has 2 migraines per month and normally takes percocet tablets for them.  He has been seen in the emergency department and given additional blood pressure medications however his blood pressure has remained high.  It is slowly improving but he continues to  have the headache and photophobia and nausea.  He is not having any focal neurological signs at this time.  He was given several treatments in the ED but continued to complain of headache.  He denies visual changes.  He denies chest pain and shortness of breath.  He denies fever and chills.  He reports positive symptoms of photophobia.  The ED physician requested patient be observed in the hospital overnight.  The patient says that he has not yet established care with a nephrologist for his chronic kidney disease.  He is followed by cardiology and has been stable from a cardiology standpoint.    Hospital Course:   Hypertensive urgency -Systolic blood pressure has dropped to 130. HA has resolved. -Have discussed with patient extreme importance of good blood pressure control especially as relates to his hypertensive cardiomyopathy and advancing chronic kidney disease. -He is on maximum doses of amlodipine, hydralazine, Coreg. -We have increased clonidine to 0.3 mg twice daily. -Wife states that he has been consistent in taking his blood pressure medications. -No ACE inhibitor/ARB for now in the presence of progressing kidney disease.  Headache -He has a history of migraine headaches, I wonder how much of his headaches are related to his hypertension. -Resolved at time of DC. -He cannot take NSAIDs due to prior history of bleeding duodenal and gastric ulcers.  Stage III-IV chronic kidney disease -Due to poorly controlled hypertension -Stressed to patient importance of good blood pressure control to avoid progression of kidney disease. -He has been referred to see nephrology outpatient however has not gotten around to  making this appointment yet. -Have convinced him to make appointment in the next 1-2 weeks. -Cr between 2.52 and 3.13 on DC.  GERD -Continue PPI  Hyperlipidemia -Continue statin  Hypokalemia -Replaced, magnesium is normal at 1.9.   Procedures:  None    Consultations:  None  Discharge Instructions  Discharge Instructions    Diet - low sodium heart healthy   Complete by:  As directed    Increase activity slowly   Complete by:  As directed      Allergies as of 01/08/2018      Reactions   Bee Venom Anaphylaxis   Prochlorperazine Edisylate Other (See Comments)   Reaction: Jittery,uncomfortable feeling   Cyclobenzaprine Hcl Other (See Comments)   REACTION: feel anxious, nausea   Aleve [naproxen Sodium] Nausea And Vomiting   States that this medication also causes abdominal pain   Carbamazepine    REACTION: unknown reaction   Geodon [ziprasidone Hcl] Nausea And Vomiting   Ibuprofen Nausea And Vomiting   States that this medication also causes abdominal pain   Maxzide [hydrochlorothiazide W-triamterene]    headache   Nsaids Other (See Comments)   H/o severe anemia from UGIB from ulcer   Tylenol [acetaminophen] Nausea And Vomiting   Patient states that this medication also causes abdominal pain   Other Nausea And Vomiting, Rash, Other (See Comments)   ALL MUSCLE RELAXANTS: states that they affect sciatic nerve, may cause nausea and vomiting, rash      Medication List    TAKE these medications   amLODipine 10 MG tablet Commonly known as:  NORVASC Take 1 tablet (10 mg total) by mouth daily.   atorvastatin 40 MG tablet Commonly known as:  LIPITOR Take 1 tablet (40 mg total) by mouth daily at 6 PM.   carvedilol 25 MG tablet Commonly known as:  COREG Take 1 tablet (25 mg total) by mouth 2 (two) times daily.   cloNIDine 0.3 MG tablet Commonly known as:  CATAPRES Take 1 tablet (0.3 mg total) by mouth 2 (two) times daily. What changed:    how much to take  how to take this  when to take this  additional instructions   hydrALAZINE 100 MG tablet Commonly known as:  APRESOLINE Take 1 tablet (100 mg total) by mouth 3 (three) times daily.   hydrOXYzine 25 MG capsule Commonly known as:  VISTARIL Take 1 capsule (25  mg total) by mouth every 6 (six) hours as needed for nausea.   nitroGLYCERIN 0.4 MG SL tablet Commonly known as:  NITROSTAT Place 1 tablet (0.4 mg total) under the tongue every 5 (five) minutes as needed for chest pain.   ondansetron 4 MG disintegrating tablet Commonly known as:  ZOFRAN ODT Take 1 tablet (4 mg total) every 8 (eight) hours as needed by mouth for nausea.   oxyCODONE-acetaminophen 7.5-325 MG tablet Commonly known as:  PERCOCET Take 1 tablet daily as needed by mouth for pain.   pantoprazole 20 MG tablet Commonly known as:  PROTONIX Take 1 tablet (20 mg total) by mouth daily.   potassium chloride SA 20 MEQ tablet Commonly known as:  K-DUR,KLOR-CON Take 1 tablet (20 mEq total) by mouth 2 (two) times daily for 3 days.   promethazine 25 MG tablet Commonly known as:  PHENERGAN Take 1 tablet (25 mg total) by mouth every 6 (six) hours as needed for nausea or vomiting.      Allergies  Allergen Reactions  . Bee Venom Anaphylaxis  . Prochlorperazine Edisylate Other (  See Comments)    Reaction: Jittery,uncomfortable feeling  . Cyclobenzaprine Hcl Other (See Comments)    REACTION: feel anxious, nausea  . Aleve [Naproxen Sodium] Nausea And Vomiting    States that this medication also causes abdominal pain  . Carbamazepine     REACTION: unknown reaction  . Geodon [Ziprasidone Hcl] Nausea And Vomiting  . Ibuprofen Nausea And Vomiting    States that this medication also causes abdominal pain  . Maxzide [Hydrochlorothiazide W-Triamterene]     headache  . Nsaids Other (See Comments)    H/o severe anemia from UGIB from ulcer  . Tylenol [Acetaminophen] Nausea And Vomiting    Patient states that this medication also causes abdominal pain  . Other Nausea And Vomiting, Rash and Other (See Comments)    ALL MUSCLE RELAXANTS: states that they affect sciatic nerve, may cause nausea and vomiting, rash   Follow-up Information    Fayrene Helper, MD. Schedule an appointment as  soon as possible for a visit in 2 weeks.   Specialty:  Family Medicine Contact information: [address] [place] [address] [phone]        Herminio Commons, MD On 01/23/2018.   Specialty:  Cardiology Why:  2:00 w/ Dr Marlyce Huge information: [place] [phone]            The results of significant diagnostics from this hospitalization (including imaging, microbiology, ancillary and laboratory) are listed below for reference.    Significant Diagnostic Studies: Ct Head Wo Contrast  Result Date: 01/06/2018 CLINICAL DATA:  Headache.  Hypertension.  Vomited this morning. EXAM: CT HEAD WITHOUT CONTRAST TECHNIQUE: Contiguous axial images were obtained from the base of the skull through the vertex without intravenous contrast. COMPARISON:  05/21/2017. FINDINGS: Brain: Normal appearing cerebral hemispheres and posterior fossa structures. Normal size and position of the ventricles. No intracranial hemorrhage, mass lesion or CT evidence of acute infarction. Vascular: No hyperdense vessel or unexpected calcification. Skull: Normal. Negative for fracture or focal lesion. Sinuses/Orbits: Stable left maxillary sinus retention cyst. Unremarkable orbits. Other: None. IMPRESSION: No acute abnormality. Electronically Signed   By: Claudie Revering M.D.   On: 01/06/2018 15:07    Microbiology: No results found for this or any previous visit (from the past 240 hour(s)).   Labs: Basic Metabolic Panel: Recent Labs  Lab 01/06/18 1027 01/07/18 0604  NA 142 138  K 3.4* 3.6  CL 107 105  CO2 25 25  GLUCOSE 111* 102*  BUN 24* 24*  CREATININE 2.52* 3.13*  CALCIUM 8.7* 8.2*  MG  --  1.9   Liver Function Tests: Recent Labs  Lab 01/06/18 1027  AST 15  ALT 8*  ALKPHOS 69  BILITOT 0.9  PROT 6.8  ALBUMIN 4.1   No results for input(s): LIPASE, AMYLASE in the last 168 hours. No results for input(s): AMMONIA in the last 168 hours. CBC: Recent  Labs  Lab 01/06/18 1027  WBC 3.6*  NEUTROABS 2.9  HGB 11.9*  HCT 35.1*  MCV 94.9  PLT 273   Cardiac Enzymes: Recent Labs  Lab 01/06/18 1027  TROPONINI <0.03   BNP: BNP (last 3 results) No results for input(s): BNP in the last 8760 hours.  ProBNP (last 3 results) No results for input(s): PROBNP in the last 8760 hours.  CBG: No results for input(s): GLUCAP in the last 168 hours.     Signed:  Lelon Frohlich  [facility] Pager: [phone] 01/08/2018, 11:24  AM     

## 2018-01-08 NOTE — Progress Notes (Signed)
Patient discharging home with personal belongings. IV removed and site intact. Patient discharging with all paper prescriptions as well.

## 2018-01-09 ENCOUNTER — Telehealth: Payer: Self-pay

## 2018-01-09 NOTE — Telephone Encounter (Signed)
01/09/18 @ 9:11am Attempted to contact patient to complete TOC call. Left generic vm requesting return call. Will try again later.

## 2018-01-09 NOTE — Telephone Encounter (Signed)
Transition Care Management Follow-up Telephone Call   Date discharged? 01/08/18                How have you been since you were released from the hospital? Tired and drained and weak   Do you understand why you were in the hospital? Headache started and spiked my blood pressure. I took my medicine and after that I started vomiting and couldn't stop.   Do you understand the discharge instructions? yes   Where were you discharged to? home  Items Reviewed:  Medications reviewed: yes  Allergies reviewed: yes  Dietary changes reviewed: yes, no changes  Referrals reviewed: no referrals made   Functional Questionnaire:   Activities of Daily Living (ADLs): yes and has help if needed  Any transportation issues/concerns?: no    Any patient concerns? yes. Concerned about kidney function   Confirmed importance and date/time of follow-up visits scheduled Jan 22, 2018 at 4:00pm   Confirmed with patient if condition begins to worsen call PCP or go to the ER.  Patient was given the office number and encouraged to call back with question or concerns.  yes with verbal understanding

## 2018-01-10 ENCOUNTER — Telehealth: Payer: Self-pay | Admitting: Family Medicine

## 2018-01-10 NOTE — Telephone Encounter (Signed)
fmla papers received for spouse by fax. Copied. Sleeved. Placed in Dr.Simpson's box.

## 2018-01-17 ENCOUNTER — Ambulatory Visit: Payer: BLUE CROSS/BLUE SHIELD | Admitting: Family Medicine

## 2018-01-22 ENCOUNTER — Ambulatory Visit: Payer: BLUE CROSS/BLUE SHIELD | Admitting: Family Medicine

## 2018-01-23 ENCOUNTER — Ambulatory Visit (INDEPENDENT_AMBULATORY_CARE_PROVIDER_SITE_OTHER): Payer: BLUE CROSS/BLUE SHIELD | Admitting: Cardiology

## 2018-01-23 ENCOUNTER — Encounter: Payer: Self-pay | Admitting: Cardiology

## 2018-01-23 VITALS — BP 140/84 | HR 67 | Ht 69.0 in | Wt 230.0 lb

## 2018-01-23 DIAGNOSIS — Z79899 Other long term (current) drug therapy: Secondary | ICD-10-CM

## 2018-01-23 NOTE — Patient Instructions (Addendum)
Your physician wants you to follow-up in: 6 weeks with Dr.Koneswaran       Get lab work today  : BMET     Your physician recommends that you continue on your current medications as directed. Please refer to the Current Medication list given to you today.    If you need a refill on your cardiac medications before your next appointment, please call your pharmacy.     No tests ordered today.       Thank you for choosing [facility] !

## 2018-01-23 NOTE — Progress Notes (Signed)
01/23/2018 [name]   01-14-1971  [id number]  Primary Physician Fayrene Helper, MD Primary Cardiologist: Dr Bronson Ing  HPI:  47 y/o AA male with a history of poorly controlled HTN and stage 4 CRI. He was just hospitalized 01/06/18-01/08/18 with a uncontrolled HTN with headache, nausea, and photophobia. He also has a history of migraines. His Clonidine was increased and he is sen today as a TOC post hospital follow up. Since discharge he has done well. No further headaches. He has not see Dr Hinda Lenis yet. He is taking his medications as prescribed.    Current Outpatient Medications  Medication Sig Dispense Refill  . amLODipine (NORVASC) 10 MG tablet Take 1 tablet (10 mg total) by mouth daily. 30 tablet 3  . atorvastatin (LIPITOR) 40 MG tablet Take 1 tablet (40 mg total) by mouth daily at 6 PM. 30 tablet 2  . carvedilol (COREG) 25 MG tablet Take 1 tablet (25 mg total) by mouth 2 (two) times daily. 60 tablet 2  . cloNIDine (CATAPRES) 0.3 MG tablet Take 1 tablet (0.3 mg total) by mouth 2 (two) times daily. 60 tablet 2  . hydrALAZINE (APRESOLINE) 100 MG tablet Take 1 tablet (100 mg total) by mouth 3 (three) times daily. 90 tablet 2  . hydrOXYzine (VISTARIL) 25 MG capsule Take 1 capsule (25 mg total) by mouth every 6 (six) hours as needed for nausea. 12 capsule 0  . nitroGLYCERIN (NITROSTAT) 0.4 MG SL tablet Place 1 tablet (0.4 mg total) under the tongue every 5 (five) minutes as needed for chest pain. 25 tablet 1  . ondansetron (ZOFRAN ODT) 4 MG disintegrating tablet Take 1 tablet (4 mg total) every 8 (eight) hours as needed by mouth for nausea. 6 tablet 0  . oxyCODONE-acetaminophen (PERCOCET) 7.5-325 MG tablet Take 1 tablet daily as needed by mouth for pain.    . pantoprazole (PROTONIX) 20 MG tablet Take 1 tablet (20 mg total) by mouth daily. 30 tablet 0  . promethazine (PHENERGAN) 25 MG tablet Take 1 tablet (25 mg total) by mouth every 6 (six) hours as needed for nausea or vomiting.  30 tablet 0  . potassium chloride SA (K-DUR,KLOR-CON) 20 MEQ tablet Take 1 tablet (20 mEq total) by mouth 2 (two) times daily for 3 days. 6 tablet 0   No current facility-administered medications for this visit.     Allergies  Allergen Reactions  . Bee Venom Anaphylaxis  . Prochlorperazine Edisylate Other (See Comments)    Reaction: Jittery,uncomfortable feeling  . Cyclobenzaprine Hcl Other (See Comments)    REACTION: feel anxious, nausea  . Aleve [Naproxen Sodium] Nausea And Vomiting    States that this medication also causes abdominal pain  . Carbamazepine     REACTION: unknown reaction  . Geodon [Ziprasidone Hcl] Nausea And Vomiting  . Ibuprofen Nausea And Vomiting    States that this medication also causes abdominal pain  . Maxzide [Hydrochlorothiazide W-Triamterene]     headache  . Nsaids Other (See Comments)    H/o severe anemia from UGIB from ulcer  . Tylenol [Acetaminophen] Nausea And Vomiting    Patient states that this medication also causes abdominal pain  . Other Nausea And Vomiting, Rash and Other (See Comments)    ALL MUSCLE RELAXANTS: states that they affect sciatic nerve, may cause nausea and vomiting, rash    Past Medical History:  Diagnosis Date  . Chronic back pain   . Duodenitis with bleeding 08/2008   Ulcer - admitted to [facility]   . Essential hypertension, benign   . GERD (gastroesophageal reflux disease)   . Headache(784.0)   . Hyperlipidemia   . Migraines   . Obesity, unspecified     Social History   Socioeconomic History  . Marital status: Married    Spouse name: Not on file  . Number of children: 4  . Years of education: Not on file  . Highest education level: Not on file  Occupational History  . Occupation: unemployed   Social Needs  . Financial resource strain: Not on file  . Food insecurity:    Worry: Not on file    Inability: Not on file  . Transportation needs:    Medical: Not on file    Non-medical: Not on file    Tobacco Use  . Smoking status: Former Smoker    Packs/day: 0.10    Types: Cigarettes    Last attempt to quit: 05/28/2012    Years since quitting: 5.6  . Smokeless tobacco: Never Used  Substance and Sexual Activity  . Alcohol use: No  . Drug use: No  . Sexual activity: Not Currently  Lifestyle  . Physical activity:    Days per week: Not on file    Minutes per session: Not on file  . Stress: Not on file  Relationships  . Social connections:    Talks on phone: Not on file    Gets together: Not on file    Attends religious service: Not on file    Active member of club or organization: Not on file    Attends meetings of clubs or organizations: Not on file    Relationship status: Not on file  . Intimate partner violence:    Fear of current or ex partner: Not on file    Emotionally abused: Not on file    Physically abused: Not on file    Forced sexual activity: Not on file  Other Topics Concern  . Not on file  Social History Narrative  . Not on file     Family History  Problem Relation Age of Onset  . Diabetes Mother   . Hypertension Mother   . Hypertension Father   . Arthritis Father   . Diabetes Father      Review of Systems: General: negative for chills, fever, night sweats or weight changes.  Cardiovascular: negative for chest pain, dyspnea on exertion, edema, orthopnea, palpitations, paroxysmal nocturnal dyspnea or shortness of breath Dermatological: negative for rash Respiratory: negative for cough or wheezing Urologic: negative for hematuria Abdominal: negative for nausea, vomiting, diarrhea, bright red blood per rectum, melena, or hematemesis Neurologic: negative for visual changes, syncope, or dizziness All other systems reviewed and are otherwise negative except as noted above.    Blood pressure 140/84, pulse 67, height 5\' 9"  (1.753 m), weight 230 lb (104.3 kg), SpO2 97 %.  General appearance: alert, cooperative and no distress Neck: no carotid bruit and  no JVD Lungs: clear to auscultation bilaterally Heart: regular rate and rhythm Extremities: pitting edema to his knees Skin: Skin color, texture, turgor normal. No rashes or lesions Neurologic: Grossly normal   ASSESSMENT AND PLAN:   HTN- Better control. He has some LE edema but denies dyspnea or orthopnea. 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 I told him no NSAIDs and he reacted like he never heard that before  Remote PUD- PUD with GI bleed-2009  PLAN  Absolutely no NSAIDs. Low salt diet. I encouraged him to f/u with renal. He may need a diuretic, if so I would suggest 80 mg of Lasix. Will check a BMP today. F/U Dr Moshe Cipro in two weeks, Dr Bronson Ing in 6 weeks.   Kerin Ransom PA-C 01/23/2018 2:27 PM

## 2018-02-06 ENCOUNTER — Ambulatory Visit: Payer: BLUE CROSS/BLUE SHIELD | Admitting: Family Medicine

## 2018-02-08 ENCOUNTER — Ambulatory Visit: Payer: BLUE CROSS/BLUE SHIELD | Admitting: Family Medicine

## 2018-02-19 ENCOUNTER — Other Ambulatory Visit: Payer: Self-pay

## 2018-02-19 ENCOUNTER — Emergency Department (HOSPITAL_COMMUNITY)
Admission: EM | Admit: 2018-02-19 | Discharge: 2018-02-19 | Disposition: A | Discharge status: Home / Self Care | Payer: BLUE CROSS/BLUE SHIELD | Attending: Emergency Medicine | Admitting: Emergency Medicine

## 2018-02-19 ENCOUNTER — Encounter (HOSPITAL_COMMUNITY): Payer: Self-pay

## 2018-02-19 ENCOUNTER — Encounter (HOSPITAL_COMMUNITY): Payer: Self-pay | Admitting: Emergency Medicine

## 2018-02-19 ENCOUNTER — Emergency Department (HOSPITAL_COMMUNITY)
Admission: EM | Admit: 2018-02-19 | Discharge: 2018-02-20 | Disposition: A | Discharge status: Home / Self Care | Payer: BLUE CROSS/BLUE SHIELD | Source: Home / Self Care | Attending: Emergency Medicine | Admitting: Emergency Medicine

## 2018-02-19 DIAGNOSIS — I129 Hypertensive chronic kidney disease with stage 1 through stage 4 chronic kidney disease, or unspecified chronic kidney disease: Secondary | ICD-10-CM

## 2018-02-19 DIAGNOSIS — M79662 Pain in left lower leg: Secondary | ICD-10-CM | POA: Diagnosis not present

## 2018-02-19 DIAGNOSIS — F17210 Nicotine dependence, cigarettes, uncomplicated: Secondary | ICD-10-CM

## 2018-02-19 DIAGNOSIS — Z79899 Other long term (current) drug therapy: Secondary | ICD-10-CM

## 2018-02-19 DIAGNOSIS — Z5321 Procedure and treatment not carried out due to patient leaving prior to being seen by health care provider: Secondary | ICD-10-CM | POA: Diagnosis not present

## 2018-02-19 DIAGNOSIS — R2242 Localized swelling, mass and lump, left lower limb: Secondary | ICD-10-CM | POA: Diagnosis not present

## 2018-02-19 DIAGNOSIS — N183 Chronic kidney disease, stage 3 (moderate): Secondary | ICD-10-CM

## 2018-02-19 DIAGNOSIS — L03116 Cellulitis of left lower limb: Secondary | ICD-10-CM | POA: Insufficient documentation

## 2018-02-19 DIAGNOSIS — M79605 Pain in left leg: Secondary | ICD-10-CM

## 2018-02-19 NOTE — ED Triage Notes (Signed)
Pt decided to leave.  Ambulatory in no distress.

## 2018-02-19 NOTE — ED Triage Notes (Signed)
Knot on lt calf that is painful, denies any injury started a couple days ago

## 2018-02-19 NOTE — ED Triage Notes (Signed)
Pt states he noticed a knot come up on the back of his left leg 2 days ago. Pt denies injury to the area.

## 2018-02-20 MED ORDER — DOXYCYCLINE HYCLATE 100 MG PO CAPS: 100.0000 mg | ORAL_CAPSULE | Freq: Two times a day (BID) | ORAL | 0 refills | Status: DC

## 2018-02-20 MED ORDER — OXYCODONE HCL 5 MG PO TABS
5.0000 mg | ORAL_TABLET | Freq: Once | ORAL | Status: AC
  Administered 2018-02-20: 5 mg via ORAL
  Filled 2018-02-20: qty 1

## 2018-02-20 MED ORDER — DOXYCYCLINE HYCLATE 100 MG PO CAPS
100.0000 mg | ORAL_CAPSULE | Freq: Two times a day (BID) | ORAL | 0 refills | Status: DC
Start: 2018-02-20 — End: 2018-05-15

## 2018-02-20 MED ORDER — DOXYCYCLINE HYCLATE 100 MG PO TABS
100.0000 mg | ORAL_TABLET | Freq: Once | ORAL | Status: AC
  Administered 2018-02-20: 100 mg via ORAL
  Filled 2018-02-20: qty 1

## 2018-02-20 NOTE — ED Provider Notes (Signed)
[facility] EMERGENCY DEPARTMENT Provider Note   CSN: [id number] Arrival date & time: 02/19/18  2049     History   Chief Complaint Chief Complaint  Patient presents with  . Leg Pain   Pt seen with NP student, I performed history/physical/documentation  HPI [name] is a 47 y.o. male.  The history is provided by the patient and a significant other.  Leg Pain   This is a new problem. The current episode started more than 2 days ago. The problem occurs constantly. The problem has been gradually worsening. The pain is present in the left lower leg. The symptoms are aggravated by activity and standing. He has tried rest for the symptoms. The treatment provided no relief. There has been no history of extremity trauma.  patient Presents with pain and swelling to left calf.  No trauma.  No other acute complaints.  No fevers.  No chest pain shortness of breath No History of DVT Past Medical History:  Diagnosis Date  . Chronic back pain   . Duodenitis with bleeding 08/2008   Ulcer - admitted to [facility]   . Essential hypertension, benign   . GERD (gastroesophageal reflux disease)   . Headache(784.0)   . Hyperlipidemia   . Migraines   . Obesity, unspecified     Patient Active Problem List   Diagnosis Date Noted  . Headache 01/07/2018  . Hypertensive urgency 01/06/2018  . Severe headache 01/06/2018  . GERD (gastroesophageal reflux disease) 01/06/2018  . Hypokalemia 01/06/2018  . Hypertensive cardiomyopathy ([place]) 05/21/2017  . Elevated troponin 05/21/2017  . Bilateral leg edema 12/26/2016  . Stress and adjustment reaction 11/04/2016  . Nausea and vomiting 09/12/2016  . Nonspecific abnormal electrocardiogram (ECG) (EKG) 08/30/2016  . Encounter for examination following treatment at hospital 05/16/2016  . Hyperlipidemia LDL goal <100 08/08/2015  . Sleep disorder 11/05/2014  . Obesity (BMI 30.0-34.9) 06/21/2014  . CKD (chronic kidney disease) stage 3, GFR 30-59  ml/min (HCC) 02/23/2014  . Bilateral renal cysts 02/23/2014  . Nocturia 02/19/2014  . Fatigue due to sleep pattern disturbance 10/20/2008  . Malignant hypertension 10/09/2007  . Migraines 10/09/2007    Past Surgical History:  Procedure Laterality Date  . 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 FINGER DEBRIDEMENT Left 09/25/2013   Thumb  . FOOT FRACTURE SURGERY          Home Medications    Prior to Admission medications   Medication Sig Start Date End Date Taking? Authorizing Provider  amLODipine (NORVASC) 10 MG tablet Take 1 tablet (10 mg total) by mouth daily. 01/08/18  Yes Isaac Bliss, Rayford Halsted, MD  atorvastatin (LIPITOR) 40 MG tablet Take 1 tablet (40 mg total) by mouth daily at 6 PM. 01/08/18  Yes Isaac Bliss, Rayford Halsted, MD  carvedilol (COREG) 25 MG tablet Take 1 tablet (25 mg total) by mouth 2 (two) times daily. 01/08/18 04/08/18 Yes Erline Hau, MD  cloNIDine (CATAPRES) 0.3 MG tablet Take 1 tablet (0.3 mg total) by mouth 2 (two) times daily. 01/08/18  Yes Isaac Bliss, Rayford Halsted, MD  hydrALAZINE (APRESOLINE) 100 MG tablet Take 1 tablet (100 mg total) by mouth 3 (three) times daily. 01/08/18  Yes Isaac Bliss, Rayford Halsted, MD  hydrOXYzine (VISTARIL) 25 MG capsule Take 1 capsule (25 mg total) by mouth every 6 (  six) hours as needed for nausea. Patient taking differently: Take 50 mg by mouth every 6 (six) hours as needed for nausea.  12/25/16  Yes Lily Kocher, PA-C  oxyCODONE-acetaminophen (PERCOCET) 7.5-325 MG tablet Take 1 tablet by mouth 2 (two) times daily as needed for moderate pain.  07/04/17  Yes [provider]  promethazine (PHENERGAN) 25 MG tablet Take 1 tablet (25 mg total) by mouth every 6 (six) hours as needed for nausea or vomiting. 10/23/17  Yes Long, Wonda Olds, MD    doxycycline (VIBRAMYCIN) 100 MG capsule Take 1 capsule (100 mg total) by mouth 2 (two) times daily. One po bid x 7 days 02/20/18   Ripley Fraise, MD  nitroGLYCERIN (NITROSTAT) 0.4 MG SL tablet Place 1 tablet (0.4 mg total) under the tongue every 5 (five) minutes as needed for chest pain. 07/15/14   Satira Sark, MD    Family History Family History  Problem Relation Age of Onset  . Diabetes Mother   . Hypertension Mother   . Hypertension Father   . Arthritis Father   . Diabetes Father     Social History Social History   Tobacco Use  . Smoking status: Current Every Day Smoker    Packs/day: 0.10    Types: Cigarettes    Last attempt to quit: 05/28/2012    Years since quitting: 5.7  . Smokeless tobacco: Never Used  Substance Use Topics  . Alcohol use: No  . Drug use: No     Allergies   Bee venom; Prochlorperazine edisylate; Cyclobenzaprine hcl; Aleve [naproxen sodium]; Carbamazepine; Geodon [ziprasidone hcl]; Ibuprofen; Maxzide [hydrochlorothiazide w-triamterene]; Nsaids; Tylenol [acetaminophen]; and Other   Review of Systems Review of Systems  Constitutional: Negative for fever.  Respiratory: Negative for shortness of breath.   Cardiovascular: Negative for chest pain.  Gastrointestinal: Negative for vomiting.  All other systems reviewed and are negative.    Physical Exam Updated Vital Signs BP (!) 155/100 (BP Location: Right Arm)   Pulse 68   Temp 98.3 F (36.8 C) (Temporal)   Resp 16   Ht 1.753 m (5\' 9" )   Wt 106.6 kg (235 lb)   SpO2 97%   BMI 34.70 kg/m   Physical Exam CONSTITUTIONAL: Well developed/well nourished HEAD: Normocephalic/atraumatic EYES: EOMI ENMT: Mucous membranes moist NECK: supple no meningeal signs CV: S1/S2 noted, no murmurs/rubs/gallops noted LUNGS: Lungs are clear to auscultation bilaterally, no apparent distress ABDOMEN: soft NEURO: Pt is awake/alert/appropriate, moves all extremitiesx4.  No facial droop.   EXTREMITIES:  pulses normal/equal, full ROM See photo.  Symmetric lower ext. edema.  Distal pulses intact.  Small area of induration with tenderness.  No crepitus.  No streaking.  Very localized.  No other calf tenderness SKIN: warm, color normal PSYCH: no abnormalities of mood noted, alert and oriented to situation  Patient gave verbal permission to utilize photo for medical documentation only The image was not stored on any personal device     ED Treatments / Results  Labs (all labs ordered are listed, but only abnormal results are displayed) Labs Reviewed - No data to display  EKG None  Radiology No results found.  Procedures Procedures  EMERGENCY DEPARTMENT US SOFT TISSUE INTERPRETATION "Study: Limited Soft Tissue Ultrasound"  INDICATIONS: Pain Multiple views of the body part were obtained in real-time with a multi-frequency linear probe  PERFORMED BY: Myself IMAGES ARCHIVED?: Yes SIDE:Left BODY PART:Lower extremity INTERPRETATION:  No abcess noted     Medications Ordered in ED Medications  oxyCODONE (Oxy IR/ROXICODONE)  immediate release tablet 5 mg (5 mg Oral Given 02/20/18 0035)  doxycycline (VIBRA-TABS) tablet 100 mg (100 mg Oral Given 02/20/18 0035)     Initial Impression / Assessment and Plan / ED Course  I have reviewed the triage vital signs and the nursing notes.  Pertinent labs  results that were available during my care of the patient were reviewed by me and considered in my medical decision making (see chart for details).     Patient very well-appearing.  Suspicion for early cellulitis versus thrombophlebitis Due to Multiple allergies and medical conditions, he is unable take NSAIDs or aspirin.  Will place him on doxycycline and advised pain medicines.  Review of narcotic database reveals he should have pain medicines at home.  I have low suspicion for acute DVT at this time  Final Clinical Impressions(s) / ED Diagnoses   Final diagnoses:  Left leg pain    Cellulitis of left lower extremity    ED Discharge Orders        Ordered    doxycycline (VIBRAMYCIN) 100 MG capsule  2 times daily,   Status:  Discontinued     02/20/18 0030    doxycycline (VIBRAMYCIN) 100 MG capsule  2 times daily     02/20/18 0031       Ripley Fraise, MD 02/20/18 [id number]

## 2018-03-02 ENCOUNTER — Encounter: Payer: Self-pay | Admitting: Cardiovascular Disease

## 2018-03-19 ENCOUNTER — Ambulatory Visit: Payer: BLUE CROSS/BLUE SHIELD | Admitting: Cardiovascular Disease

## 2018-03-28 DIAGNOSIS — G4733 Obstructive sleep apnea (adult) (pediatric): Secondary | ICD-10-CM | POA: Diagnosis not present

## 2018-03-28 DIAGNOSIS — Z79891 Long term (current) use of opiate analgesic: Secondary | ICD-10-CM | POA: Diagnosis not present

## 2018-03-28 DIAGNOSIS — G43719 Chronic migraine without aura, intractable, without status migrainosus: Secondary | ICD-10-CM | POA: Diagnosis not present

## 2018-03-28 DIAGNOSIS — I10 Essential (primary) hypertension: Secondary | ICD-10-CM | POA: Diagnosis not present

## 2018-04-03 ENCOUNTER — Emergency Department (HOSPITAL_COMMUNITY): Payer: BLUE CROSS/BLUE SHIELD

## 2018-04-03 ENCOUNTER — Emergency Department (HOSPITAL_COMMUNITY)
Admission: EM | Admit: 2018-04-03 | Discharge: 2018-04-03 | Disposition: A | Discharge status: Home / Self Care | Payer: BLUE CROSS/BLUE SHIELD | Attending: Emergency Medicine | Admitting: Emergency Medicine

## 2018-04-03 ENCOUNTER — Telehealth: Payer: Self-pay | Admitting: Family Medicine

## 2018-04-03 ENCOUNTER — Other Ambulatory Visit: Payer: Self-pay

## 2018-04-03 ENCOUNTER — Encounter (HOSPITAL_COMMUNITY): Payer: Self-pay | Admitting: *Deleted

## 2018-04-03 DIAGNOSIS — E860 Dehydration: Secondary | ICD-10-CM | POA: Insufficient documentation

## 2018-04-03 DIAGNOSIS — R509 Fever, unspecified: Secondary | ICD-10-CM | POA: Diagnosis not present

## 2018-04-03 DIAGNOSIS — I129 Hypertensive chronic kidney disease with stage 1 through stage 4 chronic kidney disease, or unspecified chronic kidney disease: Secondary | ICD-10-CM | POA: Diagnosis not present

## 2018-04-03 DIAGNOSIS — Z79899 Other long term (current) drug therapy: Secondary | ICD-10-CM | POA: Diagnosis not present

## 2018-04-03 DIAGNOSIS — R112 Nausea with vomiting, unspecified: Secondary | ICD-10-CM | POA: Diagnosis not present

## 2018-04-03 DIAGNOSIS — F17210 Nicotine dependence, cigarettes, uncomplicated: Secondary | ICD-10-CM | POA: Diagnosis not present

## 2018-04-03 DIAGNOSIS — R1111 Vomiting without nausea: Secondary | ICD-10-CM | POA: Diagnosis not present

## 2018-04-03 DIAGNOSIS — N184 Chronic kidney disease, stage 4 (severe): Secondary | ICD-10-CM | POA: Insufficient documentation

## 2018-04-03 LAB — COMPREHENSIVE METABOLIC PANEL
ALT: 11 U/L (ref 0–44)
AST: 21 U/L (ref 15–41)
Albumin: 4.8 g/dL (ref 3.5–5.0)
Alkaline Phosphatase: 65 U/L (ref 38–126)
Anion gap: 9 (ref 5–15)
BUN: 28 mg/dL — ABNORMAL HIGH (ref 6–20)
CHLORIDE: 108 mmol/L (ref 98–111)
CO2: 25 mmol/L (ref 22–32)
CREATININE: 2.77 mg/dL — AB (ref 0.61–1.24)
Calcium: 9.3 mg/dL (ref 8.9–10.3)
GFR, EST AFRICAN AMERICAN: 29 mL/min — AB (ref >60)
GFR, EST NON AFRICAN AMERICAN: 25 mL/min — AB (ref >60)
Glucose, Bld: 114 mg/dL — ABNORMAL HIGH (ref 70–99)
POTASSIUM: 3.7 mmol/L (ref 3.5–5.1)
Sodium: 142 mmol/L (ref 135–145)
Total Bilirubin: 1 mg/dL (ref 0.3–1.2)
Total Protein: 8 g/dL (ref 6.5–8.1)

## 2018-04-03 LAB — CBC
HCT: 36.4 % — ABNORMAL LOW (ref 39.0–52.0)
HEMOGLOBIN: 12.7 g/dL — AB (ref 13.0–17.0)
MCH: 32.4 pg (ref 26.0–34.0)
MCHC: 34.9 g/dL (ref 30.0–36.0)
MCV: 92.9 fL (ref 78.0–100.0)
Platelets: 235 10*3/uL (ref 150–400)
RBC: 3.92 MIL/uL — ABNORMAL LOW (ref 4.22–5.81)
RDW: 11.5 % (ref 11.5–15.5)
WBC: 8.3 10*3/uL (ref 4.0–10.5)

## 2018-04-03 LAB — URINALYSIS, ROUTINE W REFLEX MICROSCOPIC
Bilirubin Urine: NEGATIVE
Glucose, UA: NEGATIVE mg/dL
Hgb urine dipstick: NEGATIVE
KETONES UR: NEGATIVE mg/dL
Leukocytes, UA: NEGATIVE
NITRITE: NEGATIVE
PROTEIN: NEGATIVE mg/dL
Specific Gravity, Urine: 1.012 (ref 1.005–1.030)
pH: 8 (ref 5.0–8.0)

## 2018-04-03 LAB — LIPASE, BLOOD: LIPASE: 50 U/L (ref 11–51)

## 2018-04-03 MED ORDER — SODIUM CHLORIDE 0.9 % IV BOLUS
1000.0000 mL | Freq: Once | INTRAVENOUS | Status: AC
  Administered 2018-04-03: 1000 mL via INTRAVENOUS

## 2018-04-03 MED ORDER — ONDANSETRON HCL 4 MG/2ML IJ SOLN
4.0000 mg | Freq: Once | INTRAMUSCULAR | Status: AC
  Administered 2018-04-03: 4 mg via INTRAVENOUS
  Filled 2018-04-03: qty 2

## 2018-04-03 MED ORDER — MORPHINE SULFATE (PF) 4 MG/ML IV SOLN
4.0000 mg | Freq: Once | INTRAVENOUS | Status: AC
  Administered 2018-04-03: 4 mg via INTRAVENOUS
  Filled 2018-04-03: qty 1

## 2018-04-03 MED ORDER — OXYCODONE-ACETAMINOPHEN 5-325 MG PO TABS
1.0000 | ORAL_TABLET | Freq: Once | ORAL | Status: AC
  Administered 2018-04-03: 1 via ORAL
  Filled 2018-04-03: qty 1

## 2018-04-03 MED ORDER — ASPIRIN EC 325 MG PO TBEC
325.0000 mg | DELAYED_RELEASE_TABLET | Freq: Once | ORAL | Status: DC
  Filled 2018-04-03: qty 1

## 2018-04-03 MED ORDER — SODIUM CHLORIDE 0.9 % IV BOLUS
1000.0000 mL | Freq: Once | INTRAVENOUS | Status: AC
Start: 2018-04-03 — End: 2018-04-03
  Administered 2018-04-03: 1000 mL via INTRAVENOUS

## 2018-04-03 MED ORDER — PROMETHAZINE HCL 25 MG PO TABS: 25.0000 mg | ORAL_TABLET | Freq: Four times a day (QID) | ORAL | 0 refills | Status: DC | PRN

## 2018-04-03 NOTE — Telephone Encounter (Signed)
Forwarding to dr.Simpson

## 2018-04-03 NOTE — Telephone Encounter (Signed)
Patients wife called in to check the status of fmla paperwork. I do not see any in either box, nor do I see it documented that we received any since April. Cb#: [phone]/ [phone]

## 2018-04-03 NOTE — ED Triage Notes (Signed)
Pt c/o vomiting and abdominal pain that started this morning. Denies diarrhea.

## 2018-04-03 NOTE — Telephone Encounter (Signed)
Called pt wife [name] to check to see if the April papers was what she was looking for --and she advised yes.. Sent them back to Dr Moshe Cipro, the copy was noted in [place] file

## 2018-04-03 NOTE — Telephone Encounter (Signed)
Called and lm on voicemail.

## 2018-04-03 NOTE — Telephone Encounter (Signed)
Has not been here since Feb I do not have an fMLA form for him, she will need to re submit

## 2018-04-03 NOTE — ED Notes (Signed)
Pt offered Zofran ODT while waiting in triage during rounding. Pt refused, stating "I have already tried that and Phenergan at home and nothing is helping." Pt was given a clean emesis bag.

## 2018-04-03 NOTE — ED Provider Notes (Signed)
[facility] EMERGENCY DEPARTMENT Provider Note   CSN: [id number] Arrival date & time: 04/03/18  1330     History   Chief Complaint Chief Complaint  Patient presents with  . Emesis    HPI [name] is a 47 y.o. male.  Pt presents to the ED today with vomiting and abdominal pain.  The pt said he started vomiting with upper abdominal pain this morning.  The pt denies f/c.  He has tried zofran and phenergan at home which is not helping.       Past Medical History:  Diagnosis Date  . Chronic back pain   . Duodenitis with bleeding 08/2008   Ulcer - admitted to [facility]   . Essential hypertension, benign   . GERD (gastroesophageal reflux disease)   . Headache(784.0)   . Hyperlipidemia   . Migraines   . Obesity, unspecified     Patient Active Problem List   Diagnosis Date Noted  . Headache 01/07/2018  . Hypertensive urgency 01/06/2018  . Severe headache 01/06/2018  . GERD (gastroesophageal reflux disease) 01/06/2018  . Hypokalemia 01/06/2018  . Hypertensive cardiomyopathy ([place]) 05/21/2017  . Elevated troponin 05/21/2017  . Bilateral leg edema 12/26/2016  . Stress and adjustment reaction 11/04/2016  . Nausea and vomiting 09/12/2016  . Nonspecific abnormal electrocardiogram (ECG) (EKG) 08/30/2016  . Encounter for examination following treatment at hospital 05/16/2016  . Hyperlipidemia LDL goal <100 08/08/2015  . Sleep disorder 11/05/2014  . Obesity (BMI 30.0-34.9) 06/21/2014  . CKD (chronic kidney disease) stage 3, GFR 30-59 ml/min (HCC) 02/23/2014  . Bilateral renal cysts 02/23/2014  . Nocturia 02/19/2014  . Fatigue due to sleep pattern disturbance 10/20/2008  . Malignant hypertension 10/09/2007  . Migraines 10/09/2007    Past Surgical History:  Procedure Laterality Date  . 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 FINGER DEBRIDEMENT Left 09/25/2013   Thumb  . FOOT FRACTURE SURGERY          Home Medications    Prior to Admission medications   Medication Sig Start Date End Date Taking? Authorizing Provider  amLODipine (NORVASC) 10 MG tablet Take 1 tablet (10 mg total) by mouth daily. 01/08/18   Isaac Bliss, Rayford Halsted, MD  atorvastatin (LIPITOR) 40 MG tablet Take 1 tablet (40 mg total) by mouth daily at 6 PM. 01/08/18   Isaac Bliss, Rayford Halsted, MD  carvedilol (COREG) 25 MG tablet Take 1 tablet (25 mg total) by mouth 2 (two) times daily. 01/08/18 04/08/18  Isaac Bliss, Rayford Halsted, MD  cloNIDine (CATAPRES) 0.3 MG tablet Take 1 tablet (0.3 mg total) by mouth 2 (two) times daily. 01/08/18   Isaac Bliss, Rayford Halsted, MD  doxycycline (VIBRAMYCIN) 100 MG capsule Take 1 capsule (100 mg total) by mouth 2 (two) times daily. One po bid x 7 days 02/20/18   Ripley Fraise, MD  hydrALAZINE (APRESOLINE) 100 MG tablet Take 1 tablet (100 mg total) by mouth 3 (three) times daily. 01/08/18   Isaac Bliss, Rayford Halsted, MD  hydrOXYzine (VISTARIL) 25 MG capsule Take 1 capsule (25 mg total) by mouth every 6 (six) hours as needed for nausea. Patient taking differently: Take 50 mg by mouth every 6 (six) hours as needed for nausea.  12/25/16   Lily Kocher, PA-C  nitroGLYCERIN (NITROSTAT) 0.4 MG SL tablet Place 1 tablet (0.4  mg total) under the tongue every 5 (five) minutes as needed for chest pain. 07/15/14   Satira Sark, MD  oxyCODONE-acetaminophen (PERCOCET) 7.5-325 MG tablet Take 1 tablet by mouth 2 (two) times daily as needed for moderate pain.  07/04/17   [provider]  promethazine (PHENERGAN) 25 MG tablet Take 1 tablet (25 mg total) by mouth every 6 (six) hours as needed for nausea or vomiting. 04/03/18   Isla Pence, MD    Family History Family History  Problem Relation Age of Onset  . Diabetes Mother   . Hypertension Mother   . Hypertension Father   .  Arthritis Father   . Diabetes Father     Social History Social History   Tobacco Use  . Smoking status: Current Every Day Smoker    Packs/day: 0.10    Types: Cigarettes    Last attempt to quit: 05/28/2012    Years since quitting: 5.8  . Smokeless tobacco: Never Used  Substance Use Topics  . Alcohol use: No  . Drug use: No     Allergies   Bee venom; Prochlorperazine edisylate; Cyclobenzaprine hcl; Aleve [naproxen sodium]; Carbamazepine; Geodon [ziprasidone hcl]; Ibuprofen; Maxzide [hydrochlorothiazide w-triamterene]; Nsaids; Tylenol [acetaminophen]; and Other   Review of Systems Review of Systems  Gastrointestinal: Positive for abdominal pain, nausea and vomiting.  All other systems reviewed and are negative.    Physical Exam Updated Vital Signs BP (!) 153/89   Pulse 76   Temp (!) 100.5 F (38.1 C) (Oral)   Resp 17   Ht 5\' 9"  (1.753 m)   Wt 94.8 kg (209 lb)   SpO2 99%   BMI 30.86 kg/m   Physical Exam  Constitutional: He is oriented to person, place, and time. He appears well-developed and well-nourished.  HENT:  Head: Normocephalic and atraumatic.  Right Ear: External ear normal.  Left Ear: External ear normal.  Nose: Nose normal.  Mouth/Throat: Mucous membranes are dry.  Eyes: Pupils are equal, round, and reactive to light. Conjunctivae and EOM are normal.  Neck: Normal range of motion. Neck supple.  Cardiovascular: Normal rate, regular rhythm, normal heart sounds and intact distal pulses.  Pulmonary/Chest: Effort normal and breath sounds normal.  Abdominal: Soft. Bowel sounds are normal. There is tenderness in the epigastric area.  Musculoskeletal: Normal range of motion.  Neurological: He is alert and oriented to person, place, and time.  Skin: Skin is warm. Capillary refill takes less than 2 seconds.  Psychiatric: He has a normal mood and affect. His behavior is normal. Judgment and thought content normal.  Nursing note and vitals reviewed.    ED  Treatments / Results  Labs (all labs ordered are listed, but only abnormal results are displayed) Labs Reviewed  COMPREHENSIVE METABOLIC PANEL - Abnormal; Notable for the following components:      Result Value   Glucose, Bld 114 (*)    BUN 28 (*)    Creatinine, Ser 2.77 (*)    GFR calc non Af Amer 25 (*)    GFR calc Af Amer 29 (*)    All other components within normal limits  CBC - Abnormal; Notable for the following components:   RBC 3.92 (*)    Hemoglobin 12.7 (*)    HCT 36.4 (*)    All other components within normal limits  LIPASE, BLOOD  URINALYSIS, ROUTINE W REFLEX MICROSCOPIC    EKG None  Radiology Ct Abdomen Pelvis Wo Contrast  Result Date: 04/03/2018 CLINICAL DATA:  Abdominal pain and  vomiting since this morning. EXAM: CT ABDOMEN AND PELVIS WITHOUT CONTRAST TECHNIQUE: Multidetector CT imaging of the abdomen and pelvis was performed following the standard protocol without IV contrast. Creatinine 2.77. COMPARISON:  Acute abdominal series October 23, 2017 and CT abdomen and pelvis June 13, 2012. FINDINGS: LOWER CHEST: Lung bases are clear. The visualized heart size is normal. No pericardial effusion. HEPATOBILIARY: 2 mm gallstone without CT findings of acute cholecystitis. Negative non-contrast CT liver. PANCREAS: Normal. SPLEEN: Normal. ADRENALS/URINARY TRACT: Kidneys are orthotopic, demonstrating normal size and morphology. No nephrolithiasis, hydronephrosis; limited assessment for renal masses by nonenhanced CT. Similar low-density cysts bilateral kidneys. The unopacified ureters are normal in course and caliber. Urinary bladder is partially distended with disproportion and bladder wall thickening and mild perivesicular fat stranding. Normal adrenal glands. STOMACH/BOWEL: The stomach, small and large bowel are normal in course and caliber without inflammatory changes, sensitivity decreased by lack of enteric contrast. Normal appendix. VASCULAR/LYMPHATIC: Aortoiliac vessels  are normal in caliber, tortuous aorta is associated with chronic hypertension. Mild calcific atherosclerosis. No lymphadenopathy by CT size criteria. REPRODUCTIVE: Normal. OTHER: No intraperitoneal free fluid or free air. MUSCULOSKELETAL: Non-acute. Mild congenital canal narrowing lumbar spine. IMPRESSION: 1. Bladder wall thickening concerning for cystitis. Given normal urinary analysis, this may be chronic or inflammatory. 2. No nephrolithiasis or obstructive uropathy. Aortic Atherosclerosis (ICD10-I70.0). Electronically Signed   By: Elon Alas M.D.   On: 04/03/2018 21:36    Procedures Procedures (including critical care time)  Medications Ordered in ED Medications  aspirin EC tablet 325 mg (325 mg Oral Not Given 04/03/18 2059)  sodium chloride 0.9 % bolus 1,000 mL (0 mLs Intravenous Stopped 04/03/18 2058)  morphine 4 MG/ML injection 4 mg (4 mg Intravenous Given 04/03/18 1914)  ondansetron (ZOFRAN) injection 4 mg (4 mg Intravenous Given 04/03/18 1913)  sodium chloride 0.9 % bolus 1,000 mL (1,000 mLs Intravenous New Bag/Given 04/03/18 2042)  oxyCODONE-acetaminophen (PERCOCET/ROXICET) 5-325 MG per tablet 1 tablet (1 tablet Oral Given 04/03/18 2058)     Initial Impression / Assessment and Plan / ED Course  I have reviewed the triage vital signs and the nursing notes.  Pertinent labs & imaging results that were available during my care of the patient were reviewed by me and considered in my medical decision making (see chart for details).     Pt is feeling much better.  He is able to tolerate po fluids.  Return if worse.  F/u with pcp and with nephrology.  Final Clinical Impressions(s) / ED Diagnoses   Final diagnoses:  Dehydration  Non-intractable vomiting with nausea, unspecified vomiting type  Fever, unspecified fever cause  CKD (chronic kidney disease) stage 4, GFR 15-29 ml/min [facility])    ED Discharge Orders        Ordered    promethazine (PHENERGAN) 25 MG tablet  Every 6 hours  PRN     04/03/18 2156       Isla Pence, MD 04/03/18 2203

## 2018-04-05 ENCOUNTER — Other Ambulatory Visit: Payer: Self-pay

## 2018-04-05 ENCOUNTER — Encounter (HOSPITAL_COMMUNITY): Payer: Self-pay

## 2018-04-05 ENCOUNTER — Emergency Department (HOSPITAL_COMMUNITY)
Admission: EM | Admit: 2018-04-05 | Discharge: 2018-04-05 | Disposition: A | Discharge status: Home / Self Care | Payer: BLUE CROSS/BLUE SHIELD | Attending: Emergency Medicine | Admitting: Emergency Medicine

## 2018-04-05 DIAGNOSIS — N183 Chronic kidney disease, stage 3 (moderate): Secondary | ICD-10-CM | POA: Diagnosis not present

## 2018-04-05 DIAGNOSIS — I10 Essential (primary) hypertension: Secondary | ICD-10-CM

## 2018-04-05 DIAGNOSIS — Z79899 Other long term (current) drug therapy: Secondary | ICD-10-CM | POA: Diagnosis not present

## 2018-04-05 DIAGNOSIS — I43 Cardiomyopathy in diseases classified elsewhere: Secondary | ICD-10-CM

## 2018-04-05 DIAGNOSIS — R51 Headache: Secondary | ICD-10-CM | POA: Insufficient documentation

## 2018-04-05 DIAGNOSIS — F17210 Nicotine dependence, cigarettes, uncomplicated: Secondary | ICD-10-CM | POA: Diagnosis not present

## 2018-04-05 DIAGNOSIS — I119 Hypertensive heart disease without heart failure: Secondary | ICD-10-CM

## 2018-04-05 DIAGNOSIS — R112 Nausea with vomiting, unspecified: Secondary | ICD-10-CM

## 2018-04-05 DIAGNOSIS — G43909 Migraine, unspecified, not intractable, without status migrainosus: Secondary | ICD-10-CM | POA: Diagnosis not present

## 2018-04-05 DIAGNOSIS — R519 Headache, unspecified: Secondary | ICD-10-CM

## 2018-04-05 DIAGNOSIS — I129 Hypertensive chronic kidney disease with stage 1 through stage 4 chronic kidney disease, or unspecified chronic kidney disease: Secondary | ICD-10-CM | POA: Diagnosis not present

## 2018-04-05 DIAGNOSIS — R1110 Vomiting, unspecified: Secondary | ICD-10-CM | POA: Diagnosis not present

## 2018-04-05 LAB — COMPREHENSIVE METABOLIC PANEL
ALK PHOS: 55 U/L (ref 38–126)
ALT: 11 U/L (ref 0–44)
AST: 19 U/L (ref 15–41)
Albumin: 4.3 g/dL (ref 3.5–5.0)
Anion gap: 9 (ref 5–15)
BUN: 30 mg/dL — AB (ref 6–20)
CALCIUM: 8.8 mg/dL — AB (ref 8.9–10.3)
CO2: 27 mmol/L (ref 22–32)
CREATININE: 2.84 mg/dL — AB (ref 0.61–1.24)
Chloride: 105 mmol/L (ref 98–111)
GFR calc Af Amer: 28 mL/min — ABNORMAL LOW (ref >60)
GFR, EST NON AFRICAN AMERICAN: 25 mL/min — AB (ref >60)
Glucose, Bld: 101 mg/dL — ABNORMAL HIGH (ref 70–99)
Potassium: 3.4 mmol/L — ABNORMAL LOW (ref 3.5–5.1)
SODIUM: 141 mmol/L (ref 135–145)
Total Bilirubin: 1 mg/dL (ref 0.3–1.2)
Total Protein: 7.2 g/dL (ref 6.5–8.1)

## 2018-04-05 LAB — CBC WITH DIFFERENTIAL/PLATELET
Basophils Absolute: 0 10*3/uL (ref 0.0–0.1)
Basophils Relative: 0 %
Eosinophils Absolute: 0.1 10*3/uL (ref 0.0–0.7)
Eosinophils Relative: 1 %
HCT: 35.2 % — ABNORMAL LOW (ref 39.0–52.0)
HEMOGLOBIN: 12 g/dL — AB (ref 13.0–17.0)
LYMPHS ABS: 1.2 10*3/uL (ref 0.7–4.0)
LYMPHS PCT: 22 %
MCH: 32.1 pg (ref 26.0–34.0)
MCHC: 34.1 g/dL (ref 30.0–36.0)
MCV: 94.1 fL (ref 78.0–100.0)
Monocytes Absolute: 0.3 10*3/uL (ref 0.1–1.0)
Monocytes Relative: 6 %
NEUTROS PCT: 71 %
Neutro Abs: 3.7 10*3/uL (ref 1.7–7.7)
Platelets: 252 10*3/uL (ref 150–400)
RBC: 3.74 MIL/uL — AB (ref 4.22–5.81)
RDW: 11.7 % (ref 11.5–15.5)
WBC: 5.2 10*3/uL (ref 4.0–10.5)

## 2018-04-05 LAB — URINALYSIS, ROUTINE W REFLEX MICROSCOPIC
Bilirubin Urine: NEGATIVE
Glucose, UA: NEGATIVE mg/dL
HGB URINE DIPSTICK: NEGATIVE
KETONES UR: NEGATIVE mg/dL
Leukocytes, UA: NEGATIVE
Nitrite: NEGATIVE
PROTEIN: NEGATIVE mg/dL
Specific Gravity, Urine: 1.017 (ref 1.005–1.030)
pH: 6 (ref 5.0–8.0)

## 2018-04-05 LAB — TROPONIN I: Troponin I: <0.03 ng/mL (ref <0.03)

## 2018-04-05 LAB — LIPASE, BLOOD: Lipase: 65 U/L — ABNORMAL HIGH (ref 11–51)

## 2018-04-05 MED ORDER — SODIUM CHLORIDE 0.9 % IV BOLUS (SEPSIS)
1000.0000 mL | Freq: Once | INTRAVENOUS | Status: AC
  Administered 2018-04-05: 1000 mL via INTRAVENOUS

## 2018-04-05 MED ORDER — FENTANYL CITRATE (PF) 100 MCG/2ML IJ SOLN
50.0000 ug | Freq: Once | INTRAMUSCULAR | Status: AC
  Administered 2018-04-05: 50 ug via INTRAVENOUS
  Filled 2018-04-05: qty 2

## 2018-04-05 MED ORDER — HYDROCODONE-ACETAMINOPHEN 7.5-325 MG PO TABS: 1.0000 | ORAL_TABLET | ORAL | 0 refills | Status: DC | PRN

## 2018-04-05 MED ORDER — PROMETHAZINE HCL 25 MG RE SUPP: 25.0000 mg | Freq: Four times a day (QID) | RECTAL | 0 refills | Status: DC | PRN

## 2018-04-05 MED ORDER — HYDRALAZINE HCL 20 MG/ML IJ SOLN
10.0000 mg | Freq: Once | INTRAMUSCULAR | Status: AC
Start: 2018-04-05 — End: 2018-04-05
  Administered 2018-04-05: 10 mg via INTRAVENOUS
  Filled 2018-04-05: qty 1

## 2018-04-05 MED ORDER — HYDRALAZINE HCL 20 MG/ML IJ SOLN
20.0000 mg | Freq: Once | INTRAMUSCULAR | Status: AC
  Administered 2018-04-05: 20 mg via INTRAVENOUS
  Filled 2018-04-05: qty 1

## 2018-04-05 MED ORDER — ONDANSETRON HCL 4 MG/2ML IJ SOLN
4.0000 mg | Freq: Once | INTRAMUSCULAR | Status: AC
  Administered 2018-04-05: 4 mg via INTRAVENOUS
  Filled 2018-04-05: qty 2

## 2018-04-05 MED ORDER — PROMETHAZINE HCL 25 MG/ML IJ SOLN
12.5000 mg | Freq: Once | INTRAMUSCULAR | Status: AC
  Administered 2018-04-05: 12.5 mg via INTRAVENOUS
  Filled 2018-04-05: qty 1

## 2018-04-05 MED ORDER — HYDROMORPHONE HCL 1 MG/ML IJ SOLN
0.5000 mg | Freq: Once | INTRAMUSCULAR | Status: AC
  Administered 2018-04-05: 0.5 mg via INTRAVENOUS
  Filled 2018-04-05: qty 1

## 2018-04-05 MED ORDER — ONDANSETRON HCL 4 MG/2ML IJ SOLN
INTRAMUSCULAR | Status: AC
  Filled 2018-04-05: qty 2

## 2018-04-05 MED ORDER — AMLODIPINE BESYLATE 5 MG PO TABS
10.0000 mg | ORAL_TABLET | Freq: Once | ORAL | Status: DC
  Filled 2018-04-05: qty 2

## 2018-04-05 MED ORDER — SODIUM CHLORIDE 0.9 % IV SOLN: 1000.0000 mL | INTRAVENOUS | Status: DC

## 2018-04-05 NOTE — ED Provider Notes (Signed)
[facility] EMERGENCY DEPARTMENT Provider Note   CSN: [id number] Arrival date & time: 04/05/18  0277     History   Chief Complaint Chief Complaint  Patient presents with  . Emesis    HPI [name] is a 47 y.o. male.  Patient is a 47 year old male who presents to the emergency department with a complaint of vomiting and headache.  Patient has a history of migraines, non-intractable vomiting, uncontrolled hypertension, and chronic back pain.  The patient's family report vomiting over the last few days.  He has been having some upper abdominal pain that is believed to be related to the vomiting.  He was seen in the emergency department on July 16, at which time he received pain medication and antiemetics and fluids.  He was able to be discharged home.  The patient's family states that shortly after his arrival back at home he became sick again.  He tried various measures at home but these were unsuccessful.  The problem was worse this morning and they brought him back to the emergency department for additional evaluation and management.  The patient has tried Zofran and Phenergan, but he is unable to keep these down.  The headache seems to be getting progressively worse.  Headache is mostly in the temporal and frontal area.The patient is also concerned that his blood pressure is rising as he is unable to keep his medication down.  The patient denies any loss of bowel or bladder function.  He is not had any loss of use of the upper or lower extremities.  He has had some blurring of his vision, and he has a headache, but otherwise no other neurologic  changes appreciated.  No recent injury or trauma reported.  The history is provided by the patient and a relative.    Past Medical History:  Diagnosis Date  . Chronic back pain   . Duodenitis with bleeding 08/2008   Ulcer - [facility] to [facility]   . Essential hypertension, benign   . GERD (gastroesophageal reflux disease)   .  Headache(784.0)   . Hyperlipidemia   . Migraines   . Obesity, unspecified     Patient Active Problem List   Diagnosis Date Noted  . Headache 01/07/2018  . Hypertensive urgency 01/06/2018  . Severe headache 01/06/2018  . GERD (gastroesophageal reflux disease) 01/06/2018  . Hypokalemia 01/06/2018  . Hypertensive cardiomyopathy ([place]) 05/21/2017  . Elevated troponin 05/21/2017  . Bilateral leg edema 12/26/2016  . Stress and adjustment reaction 11/04/2016  . Nausea and vomiting 09/12/2016  . Nonspecific abnormal electrocardiogram (ECG) (EKG) 08/30/2016  . Encounter for examination following treatment at hospital 05/16/2016  . Hyperlipidemia LDL goal <100 08/08/2015  . Sleep disorder 11/05/2014  . Obesity (BMI 30.0-34.9) 06/21/2014  . CKD (chronic kidney disease) stage 3, GFR 30-59 ml/min (HCC) 02/23/2014  . Bilateral renal cysts 02/23/2014  . Nocturia 02/19/2014  . Fatigue due to sleep pattern disturbance 10/20/2008  . Malignant hypertension 10/09/2007  . Migraines 10/09/2007    Past Surgical History:  Procedure Laterality Date  . 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 FINGER DEBRIDEMENT Left 09/25/2013   Thumb  . FOOT FRACTURE SURGERY          Home Medications    Prior to Admission medications   Medication Sig Start Date End Date  Taking? Authorizing Provider  amLODipine (NORVASC) 10 MG tablet Take 1 tablet (10 mg total) by mouth daily. 01/08/18   Isaac Bliss, Rayford Halsted, MD  atorvastatin (LIPITOR) 40 MG tablet Take 1 tablet (40 mg total) by mouth daily at 6 PM. 01/08/18   Isaac Bliss, Rayford Halsted, MD  carvedilol (COREG) 25 MG tablet Take 1 tablet (25 mg total) by mouth 2 (two) times daily. 01/08/18 04/08/18  Isaac Bliss, Rayford Halsted, MD  cloNIDine (CATAPRES) 0.3 MG tablet Take 1 tablet (0.3 mg total)  by mouth 2 (two) times daily. 01/08/18   Isaac Bliss, Rayford Halsted, MD  doxycycline (VIBRAMYCIN) 100 MG capsule Take 1 capsule (100 mg total) by mouth 2 (two) times daily. One po bid x 7 days 02/20/18   Ripley Fraise, MD  hydrALAZINE (APRESOLINE) 100 MG tablet Take 1 tablet (100 mg total) by mouth 3 (three) times daily. 01/08/18   Isaac Bliss, Rayford Halsted, MD  hydrOXYzine (VISTARIL) 25 MG capsule Take 1 capsule (25 mg total) by mouth every 6 (six) hours as needed for nausea. Patient taking differently: Take 50 mg by mouth every 6 (six) hours as needed for nausea.  12/25/16   Lily Kocher, PA-C  nitroGLYCERIN (NITROSTAT) 0.4 MG SL tablet Place 1 tablet (0.4 mg total) under the tongue every 5 (five) minutes as needed for chest pain. 07/15/14   Satira Sark, MD  oxyCODONE-acetaminophen (PERCOCET) 7.5-325 MG tablet Take 1 tablet by mouth 2 (two) times daily as needed for moderate pain.  07/04/17   [provider]  promethazine (PHENERGAN) 25 MG tablet Take 1 tablet (25 mg total) by mouth every 6 (six) hours as needed for nausea or vomiting. 04/03/18   Isla Pence, MD    Family History Family History  Problem Relation Age of Onset  . Diabetes Mother   . Hypertension Mother   . Hypertension Father   . Arthritis Father   . Diabetes Father     Social History Social History   Tobacco Use  . Smoking status: Current Every Day Smoker    Packs/day: 0.10    Types: Cigarettes    Last attempt to quit: 05/28/2012    Years since quitting: 5.8  . Smokeless tobacco: Never Used  Substance Use Topics  . Alcohol use: No  . Drug use: No     Allergies   Bee venom; Prochlorperazine edisylate; Cyclobenzaprine hcl; Aleve [naproxen sodium]; Carbamazepine; Geodon [ziprasidone hcl]; Ibuprofen; Maxzide [hydrochlorothiazide w-triamterene]; Nsaids; Tylenol [acetaminophen]; and Other   Review of Systems Review of Systems  Constitutional: Positive for fatigue. Negative for activity change.        All ROS Neg except as noted in HPI  HENT: Negative for nosebleeds.   Eyes: Positive for photophobia. Negative for discharge.  Respiratory: Negative for cough, shortness of breath and wheezing.   Cardiovascular: Negative for chest pain and palpitations.  Gastrointestinal: Positive for nausea and vomiting. Negative for abdominal pain and blood in stool.  Genitourinary: Negative for dysuria, frequency and hematuria.  Musculoskeletal: Negative for arthralgias, back pain and neck pain.  Skin: Negative.   Neurological: Positive for headaches. Negative for dizziness, seizures, syncope, facial asymmetry and speech difficulty.  Psychiatric/Behavioral: Negative for confusion and hallucinations.     Physical Exam Updated Vital Signs BP (!) 178/109   Pulse 67   Temp 98.2 F (36.8 C) (Oral)   Resp 19   Wt 94.8 kg (209 lb)   SpO2 99%   BMI 30.86 kg/m   Physical Exam  Constitutional:  He appears well-developed and well-nourished. No distress.  HENT:  Head: Normocephalic and atraumatic.    Right Ear: External ear normal.  Left Ear: External ear normal.  Eyes: Conjunctivae are normal. Right eye exhibits no discharge. Left eye exhibits no discharge. No scleral icterus.  Neck: Neck supple. No tracheal deviation present.  Cardiovascular: Normal rate, regular rhythm and intact distal pulses.  Pulmonary/Chest: Effort normal and breath sounds normal. No stridor. No respiratory distress. He has no wheezes. He has no rales.  Abdominal: Soft. Bowel sounds are normal. He exhibits no distension. There is no tenderness. There is no rebound and no guarding.  Mild to moderate epigastric tenderness.  No guarding appreciated.  Musculoskeletal: He exhibits no edema or tenderness.  Neurological: He is alert. He has normal strength. No cranial nerve deficit (no facial droop, extraocular movements intact, no slurred speech) or sensory deficit. He exhibits normal muscle tone. He displays no seizure activity.  Coordination normal.  Skin: Skin is warm and dry. No rash noted.  Psychiatric: He has a normal mood and affect.  Nursing note and vitals reviewed.    ED Treatments / Results  Labs (all labs ordered are listed, but only abnormal results are displayed) Labs Reviewed  CBC WITH DIFFERENTIAL/PLATELET - Abnormal; Notable for the following components:      Result Value   RBC 3.74 (*)    Hemoglobin 12.0 (*)    HCT 35.2 (*)    All other components within normal limits  COMPREHENSIVE METABOLIC PANEL - Abnormal; Notable for the following components:   Potassium 3.4 (*)    Glucose, Bld 101 (*)    BUN 30 (*)    Creatinine, Ser 2.84 (*)    Calcium 8.8 (*)    GFR calc non Af Amer 25 (*)    GFR calc Af Amer 28 (*)    All other components within normal limits  LIPASE, BLOOD - Abnormal; Notable for the following components:   Lipase 65 (*)    All other components within normal limits  URINALYSIS, ROUTINE W REFLEX MICROSCOPIC  TROPONIN I    EKG None  Radiology Ct Abdomen Pelvis Wo Contrast  Result Date: 04/03/2018 CLINICAL DATA:  Abdominal pain and vomiting since this morning. EXAM: CT ABDOMEN AND PELVIS WITHOUT CONTRAST TECHNIQUE: Multidetector CT imaging of the abdomen and pelvis was performed following the standard protocol without IV contrast. Creatinine 2.77. COMPARISON:  Acute abdominal series October 23, 2017 and CT abdomen and pelvis June 13, 2012. FINDINGS: LOWER CHEST: Lung bases are clear. The visualized heart size is normal. No pericardial effusion. HEPATOBILIARY: 2 mm gallstone without CT findings of acute cholecystitis. Negative non-contrast CT liver. PANCREAS: Normal. SPLEEN: Normal. ADRENALS/URINARY TRACT: Kidneys are orthotopic, demonstrating normal size and morphology. No nephrolithiasis, hydronephrosis; limited assessment for renal masses by nonenhanced CT. Similar low-density cysts bilateral kidneys. The unopacified ureters are normal in course and caliber. Urinary  bladder is partially distended with disproportion and bladder wall thickening and mild perivesicular fat stranding. Normal adrenal glands. STOMACH/BOWEL: The stomach, small and large bowel are normal in course and caliber without inflammatory changes, sensitivity decreased by lack of enteric contrast. Normal appendix. VASCULAR/LYMPHATIC: Aortoiliac vessels are normal in caliber, tortuous aorta is associated with chronic hypertension. Mild calcific atherosclerosis. No lymphadenopathy by CT size criteria. REPRODUCTIVE: Normal. OTHER: No intraperitoneal free fluid or free air. MUSCULOSKELETAL: Non-acute. Mild congenital canal narrowing lumbar spine. IMPRESSION: 1. Bladder wall thickening concerning for cystitis. Given normal urinary analysis, this may be chronic or inflammatory. 2.  No nephrolithiasis or obstructive uropathy. Aortic Atherosclerosis (ICD10-I70.0). Electronically Signed   By: Elon Alas M.D.   On: 04/03/2018 21:36    Procedures Procedures (including critical care time)  Medications Ordered in ED Medications  ondansetron (ZOFRAN) 4 MG/2ML injection (  Not Given 04/05/18 0815)  sodium chloride 0.9 % bolus 1,000 mL (1,000 mLs Intravenous New Bag/Given 04/05/18 0851)    Followed by  0.9 %  sodium chloride infusion (0 mLs Intravenous Hold 04/05/18 0850)  fentaNYL (SUBLIMAZE) injection 50 mcg (has no administration in time range)  promethazine (PHENERGAN) injection 12.5 mg (12.5 mg Intravenous Given 04/05/18 0852)  fentaNYL (SUBLIMAZE) injection 50 mcg (50 mcg Intravenous Given 04/05/18 0852)  hydrALAZINE (APRESOLINE) injection 20 mg (20 mg Intravenous Given 04/05/18 0914)     Initial Impression / Assessment and Plan / ED Course  I have reviewed the triage vital signs and the nursing notes.  Pertinent labs & imaging results that were available during my care of the patient were reviewed by me and considered in my medical decision making (see chart for details).       Final Clinical  Impressions(s) / ED Diagnoses MDM  Blood pressure is elevated.  IV hydralazine given.  IV antiemetic also given as well as IV pain medication for headache.  Recheck.  Blood pressure remains elevated.  Patient states that the pain medicine is not done much for his headache at all.  The 2nd  dose of hydralazine given.  Nausea is improving but not resolved.  Case discussed with Dr. Thurnell Garbe.  Recheck.  Patient continues to feel bad.  Medication for pain and anti-medic given.  Oral medication for blood pressure given to the patient, the patient states he does not feel he can keep it down and does not feel well.  Additional medication for pain given.  Recheck.  The patient is still nauseated, but not vomiting.  Headache is yet to resolve.  Will consider having the patient admitted to the hospital if not responding to the next doses of medication.  Patient's blood pressure now down to 122/88, with pulse rate of 79-81.  Pulse oximetry is 98% on room air.  Patient states he still has a headache, but not nearly what it was when he came in.  No active vomiting at this time.  Patient will be treated with Phenergan suppository and hydrocodone for pain.  Patient is to follow-up with Dr. Moshe Cipro in the office next week. Pt states he has his blood pressure medication at home. He was unable to take them this AM. The patient will return to the emergency department for additional evaluation and management if any changes in condition, problems, or concerns.     Final diagnoses:  Non-intractable vomiting with nausea, unspecified vomiting type  Bad headache  Uncontrolled hypertension    ED Discharge Orders        Ordered    promethazine (PHENERGAN) 25 MG suppository  Every 6 hours PRN     04/05/18 1347    HYDROcodone-acetaminophen (NORCO) 7.5-325 MG tablet  Every 4 hours PRN     04/05/18 1347       Lily Kocher, PA-C 04/06/18 [place], [place], DO 04/08/18 1755

## 2018-04-05 NOTE — Discharge Instructions (Addendum)
Please use promethazine suppository every 6 hours for nausea and vomiting. Use Norco for headache pain. Please increase fluids. Please call Dr Moshe Cipro today for appointment on Monday. Use blood pressure mediations as suggested. Return to the ED if any changes in your condition, problems or concerns.

## 2018-04-05 NOTE — ED Triage Notes (Signed)
Pt actively vomiting at this time. Reports he woke up vomiting. Seen 2 days ago in the ED. No diarrhea.

## 2018-04-17 ENCOUNTER — Other Ambulatory Visit: Payer: Self-pay

## 2018-04-17 ENCOUNTER — Telehealth: Payer: Self-pay | Admitting: Family Medicine

## 2018-04-17 MED ORDER — CARVEDILOL 25 MG PO TABS: 25.0000 mg | ORAL_TABLET | Freq: Two times a day (BID) | ORAL | 3 refills | Status: DC

## 2018-04-17 MED ORDER — AMLODIPINE BESYLATE 10 MG PO TABS: 10.0000 mg | ORAL_TABLET | Freq: Every day | ORAL | 3 refills | Status: DC

## 2018-04-17 NOTE — Telephone Encounter (Signed)
Needs refill  amLODipine (NORVASC) 10 MG tablet  carvedilol (COREG) 25 MG tablet(Expired)

## 2018-04-18 ENCOUNTER — Ambulatory Visit: Payer: BLUE CROSS/BLUE SHIELD | Admitting: Family Medicine

## 2018-04-30 ENCOUNTER — Ambulatory Visit: Payer: BLUE CROSS/BLUE SHIELD | Admitting: Family Medicine

## 2018-05-07 ENCOUNTER — Encounter: Payer: Self-pay | Admitting: Family Medicine

## 2018-05-11 ENCOUNTER — Telehealth: Payer: Self-pay | Admitting: Family Medicine

## 2018-05-11 NOTE — Telephone Encounter (Signed)
Pt has missed 3 recent  appts, scheduled ,he needs to make and keep an appointment

## 2018-05-11 NOTE — Telephone Encounter (Signed)
PATIENT IS REQUESTING REFILL FOR HYDROXYZINE. [place]. SCHEDULED FOR APPT 05/23/18

## 2018-05-13 ENCOUNTER — Other Ambulatory Visit: Payer: Self-pay | Admitting: Family Medicine

## 2018-05-15 ENCOUNTER — Ambulatory Visit (INDEPENDENT_AMBULATORY_CARE_PROVIDER_SITE_OTHER): Payer: BLUE CROSS/BLUE SHIELD | Admitting: Family Medicine

## 2018-05-15 ENCOUNTER — Encounter: Payer: Self-pay | Admitting: Family Medicine

## 2018-05-15 ENCOUNTER — Other Ambulatory Visit: Payer: Self-pay

## 2018-05-15 VITALS — BP 130/84 | HR 63 | Resp 12 | Ht 69.0 in | Wt 212.1 lb

## 2018-05-15 DIAGNOSIS — E669 Obesity, unspecified: Secondary | ICD-10-CM

## 2018-05-15 DIAGNOSIS — F4329 Adjustment disorder with other symptoms: Secondary | ICD-10-CM

## 2018-05-15 DIAGNOSIS — Z23 Encounter for immunization: Secondary | ICD-10-CM

## 2018-05-15 DIAGNOSIS — N184 Chronic kidney disease, stage 4 (severe): Secondary | ICD-10-CM | POA: Diagnosis not present

## 2018-05-15 DIAGNOSIS — F17200 Nicotine dependence, unspecified, uncomplicated: Secondary | ICD-10-CM

## 2018-05-15 DIAGNOSIS — I10 Essential (primary) hypertension: Secondary | ICD-10-CM

## 2018-05-15 MED ORDER — HYDROXYZINE PAMOATE 25 MG PO CAPS: 25.0000 mg | ORAL_CAPSULE | Freq: Three times a day (TID) | ORAL | 1 refills | Status: DC | PRN

## 2018-05-15 NOTE — Progress Notes (Signed)
[name]     MRN: [id number]      DOB: 03-27-1971   HPI [name] is here for follow up and re-evaluation of chronic medical conditions, medication management and review of any available recent lab and radiology data.  Preventive health is updated, specifically  Cancer screening and Immunization.   The PT denies any adverse reactions to current medications since the last visit.  There are no new concerns.  There are no specific complaints   ROS Denies recent fever or chills. Denies sinus pressure, nasal congestion, ear pain or sore throat. Denies chest congestion, productive cough or wheezing. Denies chest pains, palpitations and leg swelling Denies abdominal pain, nausea, vomiting,diarrhea or constipation.   Denies dysuria, frequency, hesitancy or incontinence. Denies joint pain, swelling and limitation in mobility. Denies uncontrolled  headaches, seizures, numbness, or tingling. Denies depression, anxiety or insomnia. Denies skin break down or rash.   PE  BP 130/84   Pulse 63   Resp 12   Ht 5\' 9"  (1.753 m)   Wt 212 lb 1.9 oz (96.2 kg)   SpO2 97% Comment: room air  BMI 31.32 kg/m   Patient alert and oriented and in no cardiopulmonary distress.  HEENT: No facial asymmetry, EOMI,   oropharynx pink and moist.  Neck supple no JVD, no mass.  Chest: Clear to auscultation bilaterally.  CVS: S1, S2 no murmurs, no S3.Regular rate.  ABD: Soft non tender.   Ext: No edema  MS: Adequate ROM spine, shoulders, hips and knees.  Skin: Intact, no ulcerations or rash noted.  Psych: Good eye contact, normal affect. Memory intact not anxious or depressed appearing.  CNS: CN 2-12 intact, power,  normal throughout.no focal deficits noted.   Assessment & Plan  Malignant hypertension Controlled, no change in medication DASH diet and commitment to daily physical activity for a minimum of 30 minutes discussed and encouraged, as a part of hypertension management. The  importance of attaining a healthy weight is also discussed.  BP/Weight 05/15/2018 04/05/2018 04/03/2018 02/19/2018 02/19/2018 01/23/2018 03/28/6268  Systolic BP 485 462 703 500 938 182 993  Diastolic BP 84 88 89 716 90 84 94  Wt. (Lbs) 212.12 209 209 235 - 230 -  BMI 31.32 30.86 30.86 34.7 - 33.97 -       CKD (chronic kidney disease) stage 4, GFR 15-29 ml/min (HCC) Deterioration in renal function explained clearly to patient and his need to keep BP controlled, avoid NSAIDS, will rept chem 7 in near future, needs nephrology eval and follow has been referred in the past and not followed through, will see what updated chemistry shows  Obesity (BMI 30.0-34.9) Deteriorated. Patient re-educated about  the importance of commitment to a  minimum of 150 minutes of exercise per week.  The importance of healthy food choices with portion control discussed. Encouraged to start a food diary, count calories and to consider  joining a support group. Sample diet sheets offered. Goals set by the patient for the next several months.   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

## 2018-05-15 NOTE — Patient Instructions (Addendum)
F/u in 4 months, call if you need me sooner  Flu vaccine today  Please return 3 stool cards next week  NON fasting Chem7 and EGFR next week   Drink 64 ounces water every day, stop sweet tea  Hydrallazine take at 6am, 2 pm and 10 pm  Clonidine take at 9am and 9pm  Amlodipine ( Norvbasc) take at 9 am  PLEASE take care of your kidneys  Call [facility] to get help with stopping smoking  Steps to Quit Smoking Smoking tobacco can be bad for your health. It can also affect almost every organ in your body. Smoking puts you and people around you at risk for many serious long-lasting (chronic) diseases. Quitting smoking is hard, but it is one of the best things that you can do for your health. It is never too late to quit. What are the benefits of quitting smoking? When you quit smoking, you lower your risk for getting serious diseases and conditions. They can include:  Lung cancer or lung disease.  Heart disease.  Stroke.  Heart attack.  Not being able to have children (infertility).  Weak bones (osteoporosis) and broken bones (fractures).  If you have coughing, wheezing, and shortness of breath, those symptoms may get better when you quit. You may also get sick less often. If you are pregnant, quitting smoking can help to lower your chances of having a baby of low birth weight. What can I do to help me quit smoking? Talk with your doctor about what can help you quit smoking. Some things you can do (strategies) include:  Quitting smoking totally, instead of slowly cutting back how much you smoke over a period of time.  Going to in-person counseling. You are more likely to quit if you go to many counseling sessions.  Using resources and support systems, such as: ? Database administrator with a Social worker. ? Phone quitlines. ? Careers information officer. ? Support groups or group counseling. ? Text messaging programs. ? Mobile phone apps or applications.  Taking medicines. Some of  these medicines may have nicotine in them. If you are pregnant or breastfeeding, do not take any medicines to quit smoking unless your doctor says it is okay. Talk with your doctor about counseling or other things that can help you.  Talk with your doctor about using more than one strategy at the same time, such as taking medicines while you are also going to in-person counseling. This can help make quitting easier. What things can I do to make it easier to quit? Quitting smoking might feel very hard at first, but there is a lot that you can do to make it easier. Take these steps:  Talk to your family and friends. Ask them to support and encourage you.  Call phone quitlines, reach out to support groups, or work with a Social worker.  Ask people who smoke to not smoke around you.  Avoid places that make you want (trigger) to smoke, such as: ? Bars. ? Parties. ? Smoke-break areas at work.  Spend time with people who do not smoke.  Lower the stress in your life. Stress can make you want to smoke. Try these things to help your stress: ? Getting regular exercise. ? Deep-breathing exercises. ? Yoga. ? Meditating. ? Doing a body scan. To do this, close your eyes, focus on one area of your body at a time from head to toe, and notice which parts of your body are tense. Try to relax the  muscles in those areas.  Download or buy apps on your mobile phone or tablet that can help you stick to your quit plan. There are many free apps, such as QuitGuide from the [employer] Office manager for Disease Control and Prevention). You can find more support from smokefree.gov and other websites.  This information is not intended to replace advice given to you by your health care provider. Make sure you discuss any questions you have with your health care provider. Document Released: 07/02/2009 Document Revised: 05/03/2016 Document Reviewed: 01/20/2015 Elsevier Interactive Patient Education  2018 [employer].

## 2018-05-21 ENCOUNTER — Encounter: Payer: Self-pay | Admitting: Family Medicine

## 2018-05-21 NOTE — Assessment & Plan Note (Addendum)
Deterioration in renal function explained clearly to patient and his need to keep BP controlled, avoid NSAIDS, will rept chem 7 in near future, needs nephrology eval and follow has been referred in the past and not followed through, will see what updated chemistry shows

## 2018-05-21 NOTE — Assessment & Plan Note (Signed)
Controlled, no change in medication DASH diet and commitment to daily physical activity for a minimum of 30 minutes discussed and encouraged, as a part of hypertension management. The importance of attaining a healthy weight is also discussed.  BP/Weight 05/15/2018 04/05/2018 04/03/2018 02/19/2018 02/19/2018 01/23/2018 4/50/3888  Systolic BP 280 034 917 915 056 979 480  Diastolic BP 84 88 89 165 90 84 94  Wt. (Lbs) 212.12 209 209 235 - 230 -  BMI 31.32 30.86 30.86 34.7 - 33.97 -

## 2018-05-21 NOTE — Assessment & Plan Note (Signed)
Asked: confirms currently smokes every day, cigarettes Assess: want to quit Advise: needs to quit, has hypertension and is at high risk for heart failure and stroke, also increased cancer risk Assist: counseled for 5 mins , literature also provided and 3 to call Arrange : f/u in 4 months

## 2018-05-21 NOTE — Assessment & Plan Note (Signed)
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

## 2018-05-21 NOTE — Assessment & Plan Note (Signed)
Deteriorated. Patient re-educated about  the importance of commitment to a  minimum of 150 minutes of exercise per week.  The importance of healthy food choices with portion control discussed. Encouraged to start a food diary, count calories and to consider  joining a support group. Sample diet sheets offered. Goals set by the patient for the next several months.   Weight /BMI 05/15/2018 04/05/2018 04/03/2018  WEIGHT 212 lb 1.9 oz 209 lb 209 lb  HEIGHT 5\' 9"  - 5\' 9"   BMI 31.32 kg/m2 30.86 kg/m2 30.86 kg/m2

## 2018-05-23 ENCOUNTER — Ambulatory Visit: Payer: BLUE CROSS/BLUE SHIELD | Admitting: Family Medicine

## 2018-05-29 ENCOUNTER — Telehealth: Payer: Self-pay | Admitting: Family Medicine

## 2018-05-29 NOTE — Telephone Encounter (Signed)
Pt is congested and would like something for congestion, Brown\greenish mucus, no fever, head feels like sinus infection. Has been going on for 3 days

## 2018-05-30 ENCOUNTER — Other Ambulatory Visit: Payer: Self-pay

## 2018-05-30 ENCOUNTER — Telehealth: Payer: Self-pay | Admitting: Family Medicine

## 2018-05-30 MED ORDER — BENZONATATE 100 MG PO CAPS: 100.0000 mg | ORAL_CAPSULE | Freq: Two times a day (BID) | ORAL | 0 refills | Status: DC

## 2018-05-30 NOTE — Telephone Encounter (Signed)
Patient left voicemail yesterday around 7:09pm requesting a call back from you to phone # [phone]/ [phone].  I called him back this morning to get more information as to why he is requesting a call, no answer, I left a detailed voicemail.

## 2018-05-30 NOTE — Telephone Encounter (Signed)
Do you want to see him again since he was just here, if so I will call him to come in

## 2018-05-30 NOTE — Telephone Encounter (Signed)
I talked with patient and gave him the information from Dr Chauncey Cruel

## 2018-05-30 NOTE — Telephone Encounter (Signed)
I recommend tessalon perles or oTC robitussin dM, if he wants the perles please erx tessalon perles 100 mg twice daily  # 20 no refill,  and saline nasal sprays and claritin, no fever no antibiotic indicated , no need for OV, lots of fluids and rest

## 2018-05-30 NOTE — Telephone Encounter (Signed)
See previous message. This has been addressed

## 2018-05-30 NOTE — Telephone Encounter (Signed)
Pt aware and med sent  

## 2018-06-21 IMAGING — NM NM MYOCAR MULTI W/SPECT W/WALL MOTION & EF
2 series · 12 of 12 positions shown · non-contrast
Comparison: none

[Series 1: rest · 6.51mm/px · 6 of 64 frames shown]
[frame 6/64]
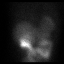
[frame 16/64]
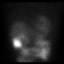
[frame 27/64]
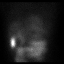
[frame 38/64]
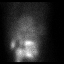
[frame 48/64]
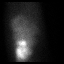
[frame 59/64]
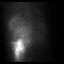

[Series 2: stress gated · 6.51mm/px · 6 of 64 frames shown]
[frame 6/64]
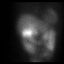
[frame 16/64]
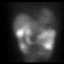
[frame 27/64]
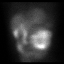
[frame 38/64]
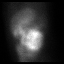
[frame 48/64]
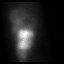
[frame 59/64]
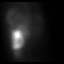

[12 of 12 positions shown; findings below may reference images not displayed]

Canned report from images found in remote index.

Refer to host system for actual result text.

## 2018-06-26 ENCOUNTER — Emergency Department (HOSPITAL_COMMUNITY): Payer: BLUE CROSS/BLUE SHIELD

## 2018-06-26 ENCOUNTER — Emergency Department (HOSPITAL_COMMUNITY)
Admission: EM | Admit: 2018-06-26 | Discharge: 2018-06-26 | Disposition: A | Discharge status: Home / Self Care | Payer: BLUE CROSS/BLUE SHIELD | Attending: Emergency Medicine | Admitting: Emergency Medicine

## 2018-06-26 ENCOUNTER — Encounter (HOSPITAL_COMMUNITY): Payer: Self-pay

## 2018-06-26 ENCOUNTER — Other Ambulatory Visit: Payer: Self-pay

## 2018-06-26 DIAGNOSIS — I129 Hypertensive chronic kidney disease with stage 1 through stage 4 chronic kidney disease, or unspecified chronic kidney disease: Secondary | ICD-10-CM | POA: Insufficient documentation

## 2018-06-26 DIAGNOSIS — F17210 Nicotine dependence, cigarettes, uncomplicated: Secondary | ICD-10-CM | POA: Diagnosis not present

## 2018-06-26 DIAGNOSIS — R519 Headache, unspecified: Secondary | ICD-10-CM

## 2018-06-26 DIAGNOSIS — N184 Chronic kidney disease, stage 4 (severe): Secondary | ICD-10-CM | POA: Diagnosis not present

## 2018-06-26 DIAGNOSIS — G43909 Migraine, unspecified, not intractable, without status migrainosus: Secondary | ICD-10-CM | POA: Diagnosis not present

## 2018-06-26 DIAGNOSIS — R112 Nausea with vomiting, unspecified: Secondary | ICD-10-CM | POA: Insufficient documentation

## 2018-06-26 DIAGNOSIS — Z89012 Acquired absence of left thumb: Secondary | ICD-10-CM | POA: Diagnosis not present

## 2018-06-26 DIAGNOSIS — Z79899 Other long term (current) drug therapy: Secondary | ICD-10-CM | POA: Insufficient documentation

## 2018-06-26 DIAGNOSIS — R51 Headache: Secondary | ICD-10-CM | POA: Insufficient documentation

## 2018-06-26 DIAGNOSIS — N189 Chronic kidney disease, unspecified: Secondary | ICD-10-CM

## 2018-06-26 LAB — URINALYSIS, ROUTINE W REFLEX MICROSCOPIC
Bilirubin Urine: NEGATIVE
Glucose, UA: NEGATIVE mg/dL
Hgb urine dipstick: NEGATIVE
KETONES UR: NEGATIVE mg/dL
LEUKOCYTES UA: NEGATIVE
Nitrite: NEGATIVE
PH: 8 (ref 5.0–8.0)
Protein, ur: NEGATIVE mg/dL
Specific Gravity, Urine: 1.01 (ref 1.005–1.030)

## 2018-06-26 LAB — LIPASE, BLOOD: LIPASE: 40 U/L (ref 11–51)

## 2018-06-26 LAB — CBC
HCT: 33.7 % — ABNORMAL LOW (ref 39.0–52.0)
Hemoglobin: 11.1 g/dL — ABNORMAL LOW (ref 13.0–17.0)
MCH: 31.9 pg (ref 26.0–34.0)
MCHC: 32.9 g/dL (ref 30.0–36.0)
MCV: 96.8 fL (ref 80.0–100.0)
NRBC: 0 % (ref 0.0–0.2)
Platelets: 246 10*3/uL (ref 150–400)
RBC: 3.48 MIL/uL — ABNORMAL LOW (ref 4.22–5.81)
RDW: 11.3 % — AB (ref 11.5–15.5)
WBC: 7.8 10*3/uL (ref 4.0–10.5)

## 2018-06-26 LAB — COMPREHENSIVE METABOLIC PANEL
ALBUMIN: 4.5 g/dL (ref 3.5–5.0)
ALT: 10 U/L (ref 0–44)
AST: 21 U/L (ref 15–41)
Alkaline Phosphatase: 62 U/L (ref 38–126)
Anion gap: 10 (ref 5–15)
BUN: 21 mg/dL — ABNORMAL HIGH (ref 6–20)
CHLORIDE: 109 mmol/L (ref 98–111)
CO2: 24 mmol/L (ref 22–32)
CREATININE: 2.73 mg/dL — AB (ref 0.61–1.24)
Calcium: 8.6 mg/dL — ABNORMAL LOW (ref 8.9–10.3)
GFR calc non Af Amer: 26 mL/min — ABNORMAL LOW (ref >60)
GFR, EST AFRICAN AMERICAN: 30 mL/min — AB (ref >60)
Glucose, Bld: 117 mg/dL — ABNORMAL HIGH (ref 70–99)
Potassium: 3.1 mmol/L — ABNORMAL LOW (ref 3.5–5.1)
Sodium: 143 mmol/L (ref 135–145)
Total Bilirubin: 0.9 mg/dL (ref 0.3–1.2)
Total Protein: 7.2 g/dL (ref 6.5–8.1)

## 2018-06-26 MED ORDER — METOCLOPRAMIDE HCL 5 MG/ML IJ SOLN
10.0000 mg | Freq: Once | INTRAMUSCULAR | Status: AC
  Administered 2018-06-26: 10 mg via INTRAVENOUS
  Filled 2018-06-26: qty 2

## 2018-06-26 MED ORDER — OXYCODONE HCL 5 MG PO TABS: 5.0000 mg | ORAL_TABLET | ORAL | 0 refills | Status: DC | PRN

## 2018-06-26 MED ORDER — FAMOTIDINE IN NACL 20-0.9 MG/50ML-% IV SOLN
20.0000 mg | Freq: Once | INTRAVENOUS | Status: AC
  Administered 2018-06-26: 20 mg via INTRAVENOUS
  Filled 2018-06-26: qty 50

## 2018-06-26 MED ORDER — PROMETHAZINE HCL 25 MG/ML IJ SOLN
12.5000 mg | Freq: Once | INTRAMUSCULAR | Status: AC
  Administered 2018-06-26: 12.5 mg via INTRAVENOUS
  Filled 2018-06-26: qty 1

## 2018-06-26 MED ORDER — SODIUM CHLORIDE 0.9 % IV BOLUS
1000.0000 mL | Freq: Once | INTRAVENOUS | Status: AC
  Administered 2018-06-26: 1000 mL via INTRAVENOUS

## 2018-06-26 MED ORDER — PROMETHAZINE HCL 25 MG/ML IJ SOLN: 12.5000 mg | Freq: Once | INTRAMUSCULAR | Status: DC

## 2018-06-26 MED ORDER — ONDANSETRON HCL 4 MG/2ML IJ SOLN
4.0000 mg | Freq: Once | INTRAMUSCULAR | Status: AC
  Administered 2018-06-26: 4 mg via INTRAVENOUS
  Filled 2018-06-26: qty 2

## 2018-06-26 MED ORDER — FENTANYL CITRATE (PF) 100 MCG/2ML IJ SOLN
50.0000 ug | Freq: Once | INTRAMUSCULAR | Status: AC
  Administered 2018-06-26: 50 ug via INTRAVENOUS
  Filled 2018-06-26: qty 2

## 2018-06-26 MED ORDER — SODIUM CHLORIDE 0.9 % IV SOLN
Freq: Once | INTRAVENOUS | Status: AC
  Administered 2018-06-26: 14:00:00 via INTRAVENOUS

## 2018-06-26 MED ORDER — PROMETHAZINE HCL 25 MG RE SUPP: 25.0000 mg | Freq: Four times a day (QID) | RECTAL | 0 refills | Status: DC | PRN

## 2018-06-26 MED ORDER — CLONIDINE HCL 0.1 MG PO TABS
0.1000 mg | ORAL_TABLET | Freq: Once | ORAL | Status: AC
  Administered 2018-06-26: 0.1 mg via ORAL
  Filled 2018-06-26: qty 1

## 2018-06-26 NOTE — ED Notes (Signed)
Pt vomiting, EDP aware, orders given

## 2018-06-26 NOTE — Discharge Instructions (Addendum)
CT scan of your head was within normal limits.  Prescription for Phenergan suppository and pain medicine.  Increase fluid.  Follow-up at [facility] on Thursday

## 2018-06-26 NOTE — ED Notes (Signed)
Going to CT 

## 2018-06-26 NOTE — ED Provider Notes (Signed)
[facility] EMERGENCY DEPARTMENT Provider Note   CSN: [id number] Arrival date & time: 06/26/18  0813     History   Chief Complaint Chief Complaint  Patient presents with  . Emesis  . Headache    HPI [name] is a 47 y.o. male.  Generalized headache and vomiting since 4 AM tod  These identical symptoms have plagued the patient in the past.  No motor or sensory deficits, confusion, difficulty swallowing, visual disturbances, facial asymmetry, stiff neck, fever, chills.  His blood pressure is normally labile.  Severity of symptoms is moderate.  Nothing makes symptoms better or worse.      Past Medical History:  Diagnosis Date  . Chronic back pain   . Duodenitis with bleeding 08/2008   Ulcer - admitted to [facility]   . Essential hypertension, benign   . GERD (gastroesophageal reflux disease)   . Headache(784.0)   . Hyperlipidemia   . Migraines   . Obesity, unspecified     Patient Active Problem List   Diagnosis Date Noted  . Headache 01/07/2018  . GERD (gastroesophageal reflux disease) 01/06/2018  . Hypokalemia 01/06/2018  . Hypertensive cardiomyopathy ([place]) 05/21/2017  . Stress and adjustment reaction 11/04/2016  . Nonspecific abnormal electrocardiogram (ECG) (EKG) 08/30/2016  . Hyperlipidemia LDL goal <100 08/08/2015  . Sleep disorder 11/05/2014  . Obesity (BMI 30.0-34.9) 06/21/2014  . CKD (chronic kidney disease) stage 4, GFR 15-29 ml/min (HCC) 02/23/2014  . Bilateral renal cysts 02/23/2014  . Nocturia 02/19/2014  . Current smoker 10/20/2008  . Fatigue due to sleep pattern disturbance 10/20/2008  . Malignant hypertension 10/09/2007  . Migraines 10/09/2007    Past Surgical History:  Procedure Laterality Date  . 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 FINGER DEBRIDEMENT Left  09/25/2013   Thumb  . FOOT FRACTURE SURGERY          Home Medications    Prior to Admission medications   Medication Sig Start Date End Date Taking? Authorizing Provider  amLODipine (NORVASC) 10 MG tablet Take 1 tablet (10 mg total) by mouth daily. 04/17/18  Yes Fayrene Helper, MD  carvedilol (COREG) 25 MG tablet Take 1 tablet (25 mg total) by mouth 2 (two) times daily. 04/17/18 07/16/18 Yes Fayrene Helper, MD  cloNIDine (CATAPRES) 0.3 MG tablet Take 1 tablet (0.3 mg total) by mouth 2 (two) times daily. 01/08/18  Yes Isaac Bliss, Rayford Halsted, MD  hydrALAZINE (APRESOLINE) 100 MG tablet TAKE 1 TABLET BY MOUTH THREE TIMES DAILY 05/14/18  Yes Fayrene Helper, MD  HYDROcodone-acetaminophen (NORCO) 7.5-325 MG tablet Take 1 tablet by mouth every 4 (four) hours as needed. 04/05/18  Yes Lily Kocher, PA-C  hydrOXYzine (VISTARIL) 25 MG capsule Take 1 capsule (25 mg total) by mouth every 8 (eight) hours as needed. 05/15/18  Yes Fayrene Helper, MD  nitroGLYCERIN (NITROSTAT) 0.4 MG SL tablet Place 1 tablet (0.4 mg total) under the tongue every 5 (five) minutes as needed for chest pain. 07/15/14  Yes Satira Sark, MD  oxyCODONE-acetaminophen (PERCOCET) 7.5-325 MG tablet Take 1 tablet by mouth 2 (two) times daily as needed for moderate pain.  07/04/17  Yes [provider]  promethazine (PHENERGAN) 25 MG suppository Place 1 suppository (25 mg total) rectally every 6 (six) hours as needed for nausea or vomiting. 04/05/18  Yes  Lily Kocher, PA-C  atorvastatin (LIPITOR) 40 MG tablet Take 1 tablet (40 mg total) by mouth daily at 6 PM. Patient not taking: Reported on 06/26/2018 01/08/18   Isaac Bliss, Rayford Halsted, MD  benzonatate (TESSALON PERLES) 100 MG capsule Take 1 capsule (100 mg total) by mouth 2 (two) times daily. Patient not taking: Reported on 06/26/2018 05/30/18 05/30/19  Fayrene Helper, MD  oxyCODONE (ROXICODONE) 5 MG immediate release tablet Take 1 tablet (5 mg  total) by mouth every 4 (four) hours as needed for severe pain. 06/26/18   Nat Christen, MD  promethazine (PHENERGAN) 25 MG suppository Place 1 suppository (25 mg total) rectally every 6 (six) hours as needed for nausea or vomiting. 06/26/18   Nat Christen, MD    Family History Family History  Problem Relation Age of Onset  . Diabetes Mother   . Hypertension Mother   . Hypertension Father   . Arthritis Father   . Diabetes Father     Social History Social History   Tobacco Use  . Smoking status: Current Every Day Smoker    Packs/day: 0.10    Types: Cigarettes    Last attempt to quit: 05/28/2012    Years since quitting: 6.0  . Smokeless tobacco: Never Used  Substance Use Topics  . Alcohol use: No  . Drug use: No     Allergies   Bee venom; Prochlorperazine edisylate; Cyclobenzaprine hcl; Aleve [naproxen sodium]; Carbamazepine; Geodon [ziprasidone hcl]; Ibuprofen; Maxzide [hydrochlorothiazide w-triamterene]; Nsaids; Tylenol [acetaminophen]; and Other   Review of Systems Review of Systems  All other systems reviewed and are negative.    Physical Exam Updated Vital Signs BP (!) 178/105   Pulse 66   Temp 98.7 F (37.1 C) (Oral)   Resp 17   Wt 97.5 kg   SpO2 99%   BMI 31.75 kg/m   Physical Exam  Constitutional: He is oriented to person, place, and time.  vomiting  HENT:  Head: Normocephalic and atraumatic.  Eyes: Conjunctivae are normal.  Neck: Neck supple.  Cardiovascular: Normal rate and regular rhythm.  Pulmonary/Chest: Effort normal and breath sounds normal.  Abdominal: Soft. Bowel sounds are normal.  Musculoskeletal: Normal range of motion.  Neurological: He is alert and oriented to person, place, and time.  Skin: Skin is warm and dry.  Psychiatric: He has a normal mood and affect. His behavior is normal.  Nursing note and vitals reviewed.    ED Treatments / Results  Labs (all labs ordered are listed, but only abnormal results are displayed) Labs  Reviewed  COMPREHENSIVE METABOLIC PANEL - Abnormal; Notable for the following components:      Result Value   Potassium 3.1 (*)    Glucose, Bld 117 (*)    BUN 21 (*)    Creatinine, Ser 2.73 (*)    Calcium 8.6 (*)    GFR calc non Af Amer 26 (*)    GFR calc Af Amer 30 (*)    All other components within normal limits  CBC - Abnormal; Notable for the following components:   RBC 3.48 (*)    Hemoglobin 11.1 (*)    HCT 33.7 (*)    RDW 11.3 (*)    All other components within normal limits  URINALYSIS, ROUTINE W REFLEX MICROSCOPIC - Abnormal; Notable for the following components:   Color, Urine STRAW (*)    All other components within normal limits  LIPASE, BLOOD    EKG EKG Interpretation  Date/Time:  Tuesday June 26 2018 16:60:63  EDT Ventricular Rate:  67 PR Interval:    QRS Duration: 103 QT Interval:  422 QTC Calculation: 446 R Axis:   -50 Text Interpretation:  Sinus rhythm LAD, consider left anterior fascicular block Confirmed by Nat Christen [phone]) on 06/26/2018 8:34:43 AM   Radiology Ct Head Wo Contrast  Result Date: 06/26/2018 CLINICAL DATA:  Headaches, abdominal pain, nausea, vomiting EXAM: CT HEAD WITHOUT CONTRAST TECHNIQUE: Contiguous axial images were obtained from the base of the skull through the vertex without intravenous contrast. COMPARISON:  01/06/2018 FINDINGS: Brain: No evidence of acute infarction, hemorrhage, hydrocephalus, extra-axial collection or mass lesion/mass effect. Vascular: No hyperdense vessel or unexpected calcification. Skull: No osseous abnormality. Sinuses/Orbits: Visualized paranasal sinuses are clear. Visualized mastoid sinuses are clear. Visualized orbits demonstrate no focal abnormality. Other: None IMPRESSION: No acute intracranial pathology. Electronically Signed   By: Kathreen Devoid   On: 06/26/2018 10:27    Procedures Procedures (including critical care time)  Medications Ordered in ED Medications  promethazine (PHENERGAN) injection  12.5 mg (has no administration in time range)  ondansetron (ZOFRAN) injection 4 mg (4 mg Intravenous Given 06/26/18 0903)  sodium chloride 0.9 % bolus 1,000 mL (0 mLs Intravenous Stopped 06/26/18 1115)  famotidine (PEPCID) IVPB 20 mg premix (0 mg Intravenous Stopped 06/26/18 0937)  sodium chloride 0.9 % bolus 1,000 mL (0 mLs Intravenous Stopped 06/26/18 1007)  ondansetron (ZOFRAN) injection 4 mg (4 mg Intravenous Given 06/26/18 0935)  metoCLOPramide (REGLAN) injection 10 mg (10 mg Intravenous Given 06/26/18 1029)  0.9 %  sodium chloride infusion ( Intravenous New Bag/Given 06/26/18 1350)  promethazine (PHENERGAN) injection 12.5 mg (12.5 mg Intravenous Given 06/26/18 1353)  fentaNYL (SUBLIMAZE) injection 50 mcg (50 mcg Intravenous Given 06/26/18 1357)  cloNIDine (CATAPRES) tablet 0.1 mg (0.1 mg Oral Given 06/26/18 1354)     Initial Impression / Assessment and Plan / ED Course  I have reviewed the triage vital signs and the nursing notes.  Pertinent labs & imaging results that were available during my care of the patient were reviewed by me and considered in my medical decision making (see chart for details).     Patient presents with vomiting and headache.  This is been a chronic intermittent problem.  CT had negative.  Creatinine elevated, but this is not new.  Patient given IV fluids, antiemetics, Reglan, Pepcid.  Symptoms ultimately improved.  No neurological deficits at discharge.  Discharge medication Phenergan 25 mg suppository and Roxicodone.  Final Clinical Impressions(s) / ED Diagnoses   Final diagnoses:  Intractable headache, unspecified chronicity pattern, unspecified headache type  Intractable vomiting with nausea, unspecified vomiting type  Chronic kidney disease, unspecified CKD stage    ED Discharge Orders         Ordered    promethazine (PHENERGAN) 25 MG suppository  Every 6 hours PRN     06/26/18 1524    oxyCODONE (ROXICODONE) 5 MG immediate release tablet  Every 4 hours PRN      06/26/18 1524           Nat Christen, MD 06/29/18 [phone]

## 2018-06-26 NOTE — ED Notes (Signed)
Pt up and dressed, wife at the bedside, no complaints, feeling better

## 2018-06-26 NOTE — ED Triage Notes (Signed)
Pt reports headache, abd pain, n/v since 4 am.  Reports thinks bp is elevated.

## 2018-06-26 NOTE — ED Notes (Signed)
Patient given discharge instruction, verbalized understand. IV removed, band aid applied. Patient wheelchair out of the department.  

## 2018-06-26 NOTE — ED Notes (Signed)
Pt back from CT, vomiting, EDP at the bedside, orders given

## 2018-06-28 DIAGNOSIS — G4733 Obstructive sleep apnea (adult) (pediatric): Secondary | ICD-10-CM | POA: Diagnosis not present

## 2018-06-28 DIAGNOSIS — G43719 Chronic migraine without aura, intractable, without status migrainosus: Secondary | ICD-10-CM | POA: Diagnosis not present

## 2018-06-28 DIAGNOSIS — I10 Essential (primary) hypertension: Secondary | ICD-10-CM | POA: Diagnosis not present

## 2018-06-28 DIAGNOSIS — Z79891 Long term (current) use of opiate analgesic: Secondary | ICD-10-CM | POA: Diagnosis not present

## 2018-07-14 ENCOUNTER — Other Ambulatory Visit: Payer: Self-pay | Admitting: Family Medicine

## 2018-07-18 ENCOUNTER — Telehealth: Payer: Self-pay | Admitting: Family Medicine

## 2018-07-18 NOTE — Telephone Encounter (Signed)
Pt LVM for the nurse to call him --[phone]

## 2018-07-18 NOTE — Telephone Encounter (Signed)
Noted, if possible to get him, pls ask him to [facility] his meds to the appt that he is taking

## 2018-07-18 NOTE — Telephone Encounter (Signed)
Spoke with patient regarding bp medications. He has only been taking 3 antihypertensive medications and he remembers at one time taking 4. He hasn't been taking Coreg which is listed on his medication list but has expired. He stated he has only been taking amlodipine, clonidine, and hydralazine. Patient has an appt tomorrow at 2:40pm. Has been checking bp's at home and they have been running high. Patient understands the importance of keeping this appt.

## 2018-07-19 ENCOUNTER — Ambulatory Visit: Payer: BLUE CROSS/BLUE SHIELD | Admitting: Family Medicine

## 2018-07-19 NOTE — Telephone Encounter (Signed)
Called and left patient a message to bring all medications to next appointment on Nov. 11, 2019. He canceled his appointment for today (10-31).

## 2018-07-30 ENCOUNTER — Ambulatory Visit: Payer: BLUE CROSS/BLUE SHIELD | Admitting: Family Medicine

## 2018-07-30 ENCOUNTER — Encounter: Payer: Self-pay | Admitting: Family Medicine

## 2018-07-30 VITALS — BP 147/95 | HR 60 | Resp 12 | Ht 69.0 in | Wt 216.1 lb

## 2018-07-30 DIAGNOSIS — K8020 Calculus of gallbladder without cholecystitis without obstruction: Secondary | ICD-10-CM | POA: Diagnosis not present

## 2018-07-30 DIAGNOSIS — Z79899 Other long term (current) drug therapy: Secondary | ICD-10-CM | POA: Diagnosis not present

## 2018-07-30 DIAGNOSIS — N184 Chronic kidney disease, stage 4 (severe): Secondary | ICD-10-CM | POA: Diagnosis not present

## 2018-07-30 DIAGNOSIS — G43809 Other migraine, not intractable, without status migrainosus: Secondary | ICD-10-CM

## 2018-07-30 DIAGNOSIS — F322 Major depressive disorder, single episode, severe without psychotic features: Secondary | ICD-10-CM | POA: Diagnosis not present

## 2018-07-30 DIAGNOSIS — I10 Essential (primary) hypertension: Secondary | ICD-10-CM | POA: Diagnosis not present

## 2018-07-30 DIAGNOSIS — D649 Anemia, unspecified: Secondary | ICD-10-CM | POA: Diagnosis not present

## 2018-07-30 DIAGNOSIS — E669 Obesity, unspecified: Secondary | ICD-10-CM

## 2018-07-30 DIAGNOSIS — E876 Hypokalemia: Secondary | ICD-10-CM | POA: Diagnosis not present

## 2018-07-30 DIAGNOSIS — N183 Chronic kidney disease, stage 3 (moderate): Secondary | ICD-10-CM | POA: Diagnosis not present

## 2018-07-30 MED ORDER — ATORVASTATIN CALCIUM 40 MG PO TABS: 40.0000 mg | ORAL_TABLET | Freq: Every day | ORAL | 2 refills | Status: DC

## 2018-07-30 MED ORDER — ONDANSETRON HCL 4 MG PO TABS: ORAL_TABLET | ORAL | 1 refills | Status: DC

## 2018-07-30 MED ORDER — CARVEDILOL 25 MG PO TABS: 25.0000 mg | ORAL_TABLET | Freq: Two times a day (BID) | ORAL | 3 refills | Status: DC

## 2018-07-30 NOTE — Progress Notes (Signed)
[name]     MRN: [id number]      DOB: Nov 28, 1970   HPI [name] is here for follow up and re-evaluation of chronic medical conditions, medication management and review of any available recent lab and radiology data.  Preventive health is updated, specifically  Cancer screening and Immunization.   Questions or concerns regarding consultations or procedures which the PT has had in the interim are  addressed. The PT denies any adverse reactions to current medications since the last visit.  C/o uncontrolled hypertension, and chronivc fatigue , daily headaches requiring narcotic medication, managed by Neurology Has not been able to keep a job for over 2 years, nearly 3 because of his health, severely depressed as a result and he will seek treatment through his wife's job, not suicidal or homicidal. 2 week h/o exertional dtyspnea, denies PND or orthopnea  ROS Denies recent fever or chills. Denies sinus pressure, nasal congestion, ear pain or sore throat. Denies chest congestion, productive cough or wheezing. Denies chest pains, palpitations and leg swelling Denies abdominal pain, nausea, vomiting,diarrhea or constipation.   Denies dysuria, frequency, hesitancy or incontinence. Denies joint pain, swelling and limitation in mobility. Denies skin break down or rash.   PE  BP (!) 147/95 (BP Location: Left Arm, Patient Position: Sitting, Cuff Size: Large)   Pulse 60   Resp 12   Ht 5\' 9"  (1.753 m)   Wt 216 lb 1.9 oz (98 kg)   SpO2 97% Comment: room air  BMI 31.92 kg/m   Patient alert and oriented and in no cardiopulmonary distress.  HEENT: No facial asymmetry, EOMI,   oropharynx pink and moist.  Neck supple no JVD, no mass.  Chest: Clear to auscultation bilaterally.  CVS: S1, S2 no murmurs, no S3.Regular rate.  ABD: Soft non tender.   Ext: No edema  MS: Adequate ROM spine, shoulders, hips and knees.  Skin: Intact, no ulcerations or rash noted.  Psych: Good eye  contact, normal affect. Memory intact not anxious or depressed appearing.  CNS: CN 2-12 intact, power,  normal throughout.no focal deficits noted.   Assessment & Plan Cholelithiasis Symptomatic, needs to be evaluated for surgery, has chronic nausea  Malignant hypertension  Sub optima control, needs to resume coreg DASH diet and commitment to daily physical activity for a minimum of 30 minutes discussed and encouraged, as a part of hypertension management. The importance of attaining a healthy weight is also discussed.  BP/Weight 07/30/2018 06/26/2018 05/15/2018 04/05/2018 04/03/2018 10/28/5186 12/18/6604  Systolic BP 301 601 093 235 573 220 254  Diastolic BP 95 270 84 88 89 100 90  Wt. (Lbs) 216.12 215 212.12 209 209 235 -  BMI 31.92 31.75 31.32 30.86 30.86 34.7 -       Migraines Increased and uncontrolled due to poor sleep depression and anxiety, managed by neurology  Depression, major, single episode, severe ([place]) Pt will get treatment through his wife's job, refuses antidepressanrt offered at the visit to start with  Obesity (BMI 30.0-34.9) Deteriorated. Patient re-educated about  the importance of commitment to a  minimum of 150 minutes of exercise per week.  The importance of healthy food choices with portion control discussed. Encouraged to start a food diary, count calories and to consider  joining a support group. Sample diet sheets offered. Goals set by the patient for the next several months.   Weight /BMI 07/30/2018 06/26/2018 05/15/2018  WEIGHT 216 lb 1.9 oz 215 lb 212 lb 1.9 oz  HEIGHT 5'  9" - 5\' 9"   BMI 31.92 kg/m2 31.75 kg/m2 31.32 kg/m2

## 2018-07-30 NOTE — Patient Instructions (Addendum)
F/U in 4.5 months, call if you need me before  Please schedule f/u with Cardiology  Please resume coreg as before  You DO NEED and will totally benefit from therapy and treatment , and I know that you want this for yourself and the people you love, your wife will arrange this  You are referred to surgery for cholelthiasis  Thank you  for choosing [facility]. We consider it a privelige to serve you.  Delivering excellent health care in a caring and  compassionate way is our goal.  Partnering with you,  so that together we can achieve this goal is our strategy.

## 2018-07-30 NOTE — Assessment & Plan Note (Addendum)
Symptomatic, needs to be evaluated for surgery, has chronic nausea

## 2018-08-01 ENCOUNTER — Telehealth: Payer: Self-pay

## 2018-08-01 NOTE — Telephone Encounter (Signed)
[facility] - Left Message  

## 2018-08-08 ENCOUNTER — Telehealth: Payer: Self-pay

## 2018-08-08 NOTE — Telephone Encounter (Signed)
2ND attempt - [facility]

## 2018-08-13 ENCOUNTER — Encounter: Payer: Self-pay | Admitting: Family Medicine

## 2018-08-13 DIAGNOSIS — F322 Major depressive disorder, single episode, severe without psychotic features: Secondary | ICD-10-CM | POA: Insufficient documentation

## 2018-08-13 HISTORY — DX: Major depressive disorder, single episode, severe without psychotic features: F32.2

## 2018-08-13 NOTE — Assessment & Plan Note (Signed)
Pt will get treatment through his wife's job, refuses antidepressanrt offered at the visit to start with

## 2018-08-13 NOTE — Assessment & Plan Note (Signed)
Increased and uncontrolled due to poor sleep depression and anxiety, managed by neurology

## 2018-08-13 NOTE — Assessment & Plan Note (Signed)
Deteriorated. Patient re-educated about  the importance of commitment to a  minimum of 150 minutes of exercise per week.  The importance of healthy food choices with portion control discussed. Encouraged to start a food diary, count calories and to consider  joining a support group. Sample diet sheets offered. Goals set by the patient for the next several months.   Weight /BMI 07/30/2018 06/26/2018 05/15/2018  WEIGHT 216 lb 1.9 oz 215 lb 212 lb 1.9 oz  HEIGHT 5\' 9"  - 5\' 9"   BMI 31.92 kg/m2 31.75 kg/m2 31.32 kg/m2

## 2018-08-13 NOTE — Assessment & Plan Note (Signed)
Sub optima control, needs to resume coreg DASH diet and commitment to daily physical activity for a minimum of 30 minutes discussed and encouraged, as a part of hypertension management. The importance of attaining a healthy weight is also discussed.  BP/Weight 07/30/2018 06/26/2018 05/15/2018 04/05/2018 04/03/2018 0/11/7541 6/0/6770  Systolic BP 340 352 481 859 093 112 162  Diastolic BP 95 446 84 88 89 100 90  Wt. (Lbs) 216.12 215 212.12 209 209 235 -  BMI 31.92 31.75 31.32 30.86 30.86 34.7 -

## 2018-08-14 ENCOUNTER — Telehealth: Payer: Self-pay

## 2018-08-14 NOTE — Telephone Encounter (Signed)
Several attempts have been made to contact patient without success.   [facility] will continue to attempt to make contact with this patient due to an elevated PHQ score  When the patient comes in for his next scheduled appointment then [facility] will be able to follow up with the patient on the telepsych machine.     Information will be routed to the PCP and Dr. Modesta Messing

## 2018-08-23 ENCOUNTER — Telehealth: Payer: Self-pay

## 2018-08-23 ENCOUNTER — Ambulatory Visit: Payer: BLUE CROSS/BLUE SHIELD | Admitting: General Surgery

## 2018-08-23 NOTE — Telephone Encounter (Signed)
[facility] - Left Message  

## 2018-08-31 ENCOUNTER — Telehealth: Payer: Self-pay

## 2018-08-31 NOTE — Telephone Encounter (Signed)
Several attempts have been made to contact patient without success.   Patient will be placed on the inactive list.   When the patient comes in for his next scheduled appointment then [facility] will be able to follow up with the patient on the telepsych machine if needed.   Information will be routed to the PCP and Dr. Modesta Messing

## 2018-09-06 ENCOUNTER — Emergency Department (HOSPITAL_COMMUNITY): Payer: BLUE CROSS/BLUE SHIELD

## 2018-09-06 ENCOUNTER — Encounter (HOSPITAL_COMMUNITY): Payer: Self-pay | Admitting: *Deleted

## 2018-09-06 ENCOUNTER — Emergency Department (HOSPITAL_COMMUNITY)
Admission: EM | Admit: 2018-09-06 | Discharge: 2018-09-06 | Disposition: A | Discharge status: Home / Self Care | Payer: BLUE CROSS/BLUE SHIELD | Attending: Emergency Medicine | Admitting: Emergency Medicine

## 2018-09-06 DIAGNOSIS — I129 Hypertensive chronic kidney disease with stage 1 through stage 4 chronic kidney disease, or unspecified chronic kidney disease: Secondary | ICD-10-CM | POA: Diagnosis not present

## 2018-09-06 DIAGNOSIS — F17210 Nicotine dependence, cigarettes, uncomplicated: Secondary | ICD-10-CM | POA: Diagnosis not present

## 2018-09-06 DIAGNOSIS — N184 Chronic kidney disease, stage 4 (severe): Secondary | ICD-10-CM | POA: Diagnosis not present

## 2018-09-06 DIAGNOSIS — M25512 Pain in left shoulder: Secondary | ICD-10-CM

## 2018-09-06 DIAGNOSIS — Z79899 Other long term (current) drug therapy: Secondary | ICD-10-CM | POA: Diagnosis not present

## 2018-09-06 MED ORDER — PREDNISONE 20 MG PO TABS: 40.0000 mg | ORAL_TABLET | Freq: Every day | ORAL | 0 refills | Status: DC

## 2018-09-06 MED ORDER — TRAMADOL HCL 50 MG PO TABS
50.0000 mg | ORAL_TABLET | Freq: Once | ORAL | Status: AC
  Administered 2018-09-06: 50 mg via ORAL
  Filled 2018-09-06: qty 1

## 2018-09-06 MED ORDER — PREDNISONE 50 MG PO TABS
60.0000 mg | ORAL_TABLET | ORAL | Status: AC
  Administered 2018-09-06: 60 mg via ORAL
  Filled 2018-09-06: qty 1

## 2018-09-06 MED ORDER — TRAMADOL HCL 50 MG PO TABS: 50.0000 mg | ORAL_TABLET | Freq: Four times a day (QID) | ORAL | 0 refills | Status: DC | PRN

## 2018-09-06 NOTE — ED Triage Notes (Signed)
Pt with left shoulder pain ongoing since this morning.  Denies chest pain or sob.  Pt denies any injury to shoulder.  Hx of same but this time worse.

## 2018-09-06 NOTE — ED Notes (Signed)
Pt with difficultly with moving arm.

## 2018-09-06 NOTE — ED Provider Notes (Signed)
[facility] EMERGENCY DEPARTMENT Provider Note   CSN: [id number] Arrival date & time: 09/06/18  1628     History   Chief Complaint Chief Complaint  Patient presents with  . Shoulder Pain    HPI [name] is a 47 y.o. male.  HPI Patient presents with left shoulder pain. Pain is focally in the left anterior superior shoulder. Patient was in his usual state of health until began about 10 hours ago.  On since that time the pain is been severe, sharp, not improved with anything, worse with motion. No dyspnea, no fever, no cough, no chills. No medication taken for pain relief. Patient denies any remarkable recent changes in medication, diet activity, diet. He is a Therapist, nutritional.  Past Medical History:  Diagnosis Date  . Chronic back pain   . Duodenitis with bleeding 08/2008   Ulcer - admitted to [facility]   . Essential hypertension, benign   . GERD (gastroesophageal reflux disease)   . Headache(784.0)   . Hyperlipidemia   . Migraines   . Obesity, unspecified     Patient Active Problem List   Diagnosis Date Noted  . Depression, major, single episode, severe ([place]) 08/13/2018  . Cholelithiasis 07/30/2018  . GERD (gastroesophageal reflux disease) 01/06/2018  . Hypokalemia 01/06/2018  . Hypertensive cardiomyopathy ([place]) 05/21/2017  . Stress and adjustment reaction 11/04/2016  . Nonspecific abnormal electrocardiogram (ECG) (EKG) 08/30/2016  . Hyperlipidemia LDL goal <100 08/08/2015  . Sleep disorder 11/05/2014  . Obesity (BMI 30.0-34.9) 06/21/2014  . CKD (chronic kidney disease) stage 4, GFR 15-29 ml/min (HCC) 02/23/2014  . Bilateral renal cysts 02/23/2014  . Nocturia 02/19/2014  . Current smoker 10/20/2008  . Fatigue due to sleep pattern disturbance 10/20/2008  . Malignant hypertension 10/09/2007  . Migraines 10/09/2007    Past Surgical History:  Procedure Laterality Date  . 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 FINGER DEBRIDEMENT Left 09/25/2013   Thumb  . FOOT FRACTURE SURGERY          Home Medications    Prior to Admission medications   Medication Sig Start Date End Date Taking? Authorizing Provider  amLODipine (NORVASC) 10 MG tablet Take 1 tablet (10 mg total) by mouth daily. 04/17/18   Fayrene Helper, MD  atorvastatin (LIPITOR) 40 MG tablet Take 1 tablet (40 mg total) by mouth daily at 6 PM. 07/30/18   Fayrene Helper, MD  carvedilol (COREG) 25 MG tablet Take 1 tablet (25 mg total) by mouth 2 (two) times daily with a meal. 07/30/18   Fayrene Helper, MD  cloNIDine (CATAPRES) 0.3 MG tablet TAKE 1 TABLET BY MOUTH TWICE DAILY 07/16/18   Fayrene Helper, MD  hydrALAZINE (APRESOLINE) 100 MG tablet TAKE 1 TABLET BY MOUTH THREE TIMES DAILY 05/14/18   Fayrene Helper, MD  HYDROcodone-acetaminophen (NORCO) 7.5-325 MG tablet Take 1 tablet by mouth every 4 (four) hours as needed. 04/05/18   Lily Kocher, PA-C  hydrOXYzine (VISTARIL) 25 MG capsule Take 1 capsule (25 mg total) by mouth every 8 (eight) hours as needed. 05/15/18   Fayrene Helper, MD  nitroGLYCERIN (NITROSTAT) 0.4 MG SL tablet Place 1 tablet (0.4 mg total) under the tongue every 5 (five) minutes as needed for chest pain. 07/15/14   Satira Sark, MD  ondansetron (ZOFRAN) 4 MG tablet One  tablet once daily , as needed, for nausea 07/30/18   Fayrene Helper, MD  oxyCODONE (ROXICODONE) 5 MG immediate release tablet Take 1 tablet (5 mg total) by mouth every 4 (four) hours as needed for severe pain. 06/26/18   Nat Christen, MD  oxyCODONE-acetaminophen (PERCOCET) 7.5-325 MG tablet Take 1 tablet by mouth 2 (two) times daily as needed for moderate pain.  07/04/17   [provider]  predniSONE (DELTASONE) 20 MG tablet Take 2 tablets (40 mg total) by mouth daily with breakfast. For the  next four days 09/06/18   Carmin Muskrat, MD  promethazine (PHENERGAN) 25 MG suppository Place 1 suppository (25 mg total) rectally every 6 (six) hours as needed for nausea or vomiting. 04/05/18   Lily Kocher, PA-C  traMADol (ULTRAM) 50 MG tablet Take 1 tablet (50 mg total) by mouth every 6 (six) hours as needed. 09/06/18   Carmin Muskrat, MD    Family History Family History  Problem Relation Age of Onset  . Diabetes Mother   . Hypertension Mother   . Hypertension Father   . Arthritis Father   . Diabetes Father     Social History Social History   Tobacco Use  . Smoking status: Current Every Day Smoker    Packs/day: 0.10    Types: Cigarettes    Last attempt to quit: 05/28/2012    Years since quitting: 6.2  . Smokeless tobacco: Never Used  Substance Use Topics  . Alcohol use: No  . Drug use: No     Allergies   Bee venom; Prochlorperazine edisylate; Cyclobenzaprine hcl; Aleve [naproxen sodium]; Carbamazepine; Geodon [ziprasidone hcl]; Ibuprofen; Maxzide [hydrochlorothiazide w-triamterene]; Nsaids; Tylenol [acetaminophen]; and Other   Review of Systems Review of Systems  Constitutional:       Per HPI, otherwise negative  HENT:       Per HPI, otherwise negative  Respiratory:       Per HPI, otherwise negative  Cardiovascular:       Per HPI, otherwise negative  Gastrointestinal: Negative for vomiting.  Endocrine:       Negative aside from HPI  Genitourinary:       Neg aside from HPI   Musculoskeletal:       Per HPI, otherwise negative  Skin: Negative.   Neurological: Negative for syncope.     Physical Exam Updated Vital Signs BP (!) 188/120 (BP Location: Right Arm)   Pulse 67   Temp 98.9 F (37.2 C) (Oral)   Resp 16   Ht 5\' 9"  (1.753 m)   Wt 95.3 kg   SpO2 99%   BMI 31.01 kg/m   Physical Exam Vitals signs and nursing note reviewed.  Constitutional:      General: He is not in acute distress.    Appearance: He is well-developed.  HENT:     Head:  Normocephalic and atraumatic.  Eyes:     Conjunctiva/sclera: Conjunctivae normal.  Cardiovascular:     Rate and Rhythm: Normal rate and regular rhythm.  Pulmonary:     Effort: Pulmonary effort is normal. No respiratory distress.     Breath sounds: No stridor.  Abdominal:     General: There is no distension.  Musculoskeletal:     Left elbow: Normal.       Arms:  Skin:    General: Skin is warm and dry.  Neurological:     Mental Status: He is alert and oriented to person, place, and time.      ED Treatments / Results  Radiology Dg Shoulder Left  Result Date: 09/06/2018 CLINICAL DATA:  Patient with left shoulder pain.  No known injury. EXAM: LEFT SHOULDER - 2+ VIEW COMPARISON:  None. FINDINGS: Normal anatomic alignment. No evidence for acute fracture or dislocation. Corticated ossific density favored represent os acromiale. IMPRESSION: No acute osseous abnormality. Electronically Signed   By: Lovey Newcomer M.D.   On: 09/06/2018 18:37    Procedures Procedures (including critical care time)  Medications Ordered in ED Medications  predniSONE (DELTASONE) tablet 60 mg (has no administration in time range)  traMADol (ULTRAM) tablet 50 mg (has no administration in time range)     Initial Impression / Assessment and Plan / ED Course  I have reviewed the triage vital signs and the nursing notes.  Pertinent labs & imaging results that were available during my care of the patient were reviewed by me and considered in my medical decision making (see chart for details).   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 No evidence for septic arthritis or other acute new pathology. Patient received sling, steroids, analgesia, was discharged with orthopedics follow-up.  Final Clinical Impressions(s) / ED  Diagnoses   Final diagnoses:  Acute pain of left shoulder    ED Discharge Orders         Ordered    predniSONE (DELTASONE) 20 MG tablet  Daily with breakfast     09/06/18 2047    traMADol (ULTRAM) 50 MG tablet  Every 6 hours PRN     09/06/18 2047           Carmin Muskrat, MD 09/06/18 2049

## 2018-09-06 NOTE — Discharge Instructions (Addendum)
As discussed, you likely have tendinitis of your shoulder. Please use the provided steroids, and ice packs for pain control.  If you develop new, or concerning changes, please return here, otherwise be sure to follow-up with our orthopedic surgery colleague.

## 2018-09-17 ENCOUNTER — Other Ambulatory Visit: Payer: Self-pay | Admitting: Family Medicine

## 2018-09-17 ENCOUNTER — Ambulatory Visit: Payer: BLUE CROSS/BLUE SHIELD | Admitting: Family Medicine

## 2018-09-20 DIAGNOSIS — Z79891 Long term (current) use of opiate analgesic: Secondary | ICD-10-CM | POA: Diagnosis not present

## 2018-09-20 DIAGNOSIS — G4733 Obstructive sleep apnea (adult) (pediatric): Secondary | ICD-10-CM | POA: Diagnosis not present

## 2018-09-20 DIAGNOSIS — I10 Essential (primary) hypertension: Secondary | ICD-10-CM | POA: Diagnosis not present

## 2018-09-20 DIAGNOSIS — G43719 Chronic migraine without aura, intractable, without status migrainosus: Secondary | ICD-10-CM | POA: Diagnosis not present

## 2018-09-25 ENCOUNTER — Encounter: Payer: Self-pay | Admitting: General Surgery

## 2018-09-25 ENCOUNTER — Ambulatory Visit (INDEPENDENT_AMBULATORY_CARE_PROVIDER_SITE_OTHER): Payer: BLUE CROSS/BLUE SHIELD | Admitting: General Surgery

## 2018-09-25 VITALS — BP 179/107 | HR 97 | Temp 98.2°F | Resp 20 | Wt 211.8 lb

## 2018-09-25 DIAGNOSIS — K8020 Calculus of gallbladder without cholecystitis without obstruction: Secondary | ICD-10-CM

## 2018-09-25 NOTE — Patient Instructions (Signed)
Laparoscopic Cholecystectomy Laparoscopic cholecystectomy is surgery to remove the gallbladder. The gallbladder is a pear-shaped organ that lies beneath the liver on the right side of the body. The gallbladder stores bile, which is a fluid that helps the body to digest fats. Cholecystectomy is often done for inflammation of the gallbladder (cholecystitis). This condition is usually caused by a buildup of gallstones (cholelithiasis) in the gallbladder. Gallstones can block the flow of bile, which can result in inflammation and pain. In severe cases, emergency surgery may be required. This procedure is done though small incisions in your abdomen (laparoscopic surgery). A thin scope with a camera (laparoscope) is inserted through one incision. Thin surgical instruments are inserted through the other incisions. In some cases, a laparoscopic procedure may be turned into a type of surgery that is done through a larger incision (open surgery). Tell a health care provider about:  Any allergies you have.  All medicines you are taking, including vitamins, herbs, eye drops, creams, and over-the-counter medicines.  Any problems you or family members have had with anesthetic medicines.  Any blood disorders you have.  Any surgeries you have had.  Any medical conditions you have.  Whether you are pregnant or may be pregnant. What are the risks? Generally, this is a safe procedure. However, problems may occur, including:  Infection.  Bleeding.  Allergic reactions to medicines.  Damage to other structures or organs.  A stone remaining in the common bile duct. The common bile duct carries bile from the gallbladder into the small intestine.  A bile leak from the cyst duct that is clipped when your gallbladder is removed.   Medicines  Ask your health care provider about: ? Changing or stopping your regular medicines. This is especially important if you are taking diabetes medicines or blood  thinners. ? Taking medicines such as aspirin and ibuprofen. These medicines can thin your blood. Do not take these medicines before your procedure if your health care provider instructs you not to.  You may be given antibiotic medicine to help prevent infection. General instructions  Let your health care provider know if you develop a cold or an infection before surgery.  Plan to have someone take you home from the hospital or clinic.  Ask your health care provider how your surgical site will be marked or identified. What happens during the procedure?   To reduce your risk of infection: ? Your health care team will wash or sanitize their hands. ? Your skin will be washed with soap. ? Hair may be removed from the surgical area.  An IV tube may be inserted into one of your veins.  You will be given one or more of the following: ? A medicine to help you relax (sedative). ? A medicine to make you fall asleep (general anesthetic).  A breathing tube will be placed in your mouth.  Your surgeon will make several small cuts (incisions) in your abdomen.  The laparoscope will be inserted through one of the small incisions. The camera on the laparoscope will send images to a TV screen (monitor) in the operating room. This lets your surgeon see inside your abdomen.  Air-like gas will be pumped into your abdomen. This will expand your abdomen to give the surgeon more room to perform the surgery.  Other tools that are needed for the procedure will be inserted through the other incisions. The gallbladder will be removed through one of the incisions.  Your common bile duct may be examined. If stones  are found in the common bile duct, they may be removed.  After your gallbladder has been removed, the incisions will be closed with stitches (sutures), staples, or skin glue.  Your incisions may be covered with a bandage (dressing). The procedure may vary among health care providers and  hospitals. What happens after the procedure?  Your blood pressure, heart rate, breathing rate, and blood oxygen level will be monitored until the medicines you were given have worn off.  You will be given medicines as needed to control your pain.  Do not drive for 24 hours if you were given a sedative. This information is not intended to replace advice given to you by your health care provider. Make sure you discuss any questions you have with your health care provider. Document Released: 09/05/2005 Document Revised: 08/03/2017 Document Reviewed: 02/22/2016 Elsevier Interactive Patient Education  2019 [employer].

## 2018-09-26 NOTE — Progress Notes (Signed)
[name]; [id number]; 1970/12/17   HPI Patient is a 48 year old black male who was referred to my care by Dr. Moshe Cipro for evaluation treatment of right upper quadrant abdominal pain.  Patient has had a history of right upper quadrant abdominal pain radiating around his right flank, nausea, and vomiting over the past few months.  It seems to be worsening.  His pain is 7 out of 10 when the pain occurs.  It is sporadic in nature.  It does seem to be worsening in frequency and intensity.  He denies any fever, chills, or jaundice.  Any food seems to be bringing on the abdominal pain. Past Medical History:  Diagnosis Date  . Chronic back pain   . Duodenitis with bleeding 08/2008   Ulcer - admitted to [facility]   . Essential hypertension, benign   . GERD (gastroesophageal reflux disease)   . Headache(784.0)   . Hyperlipidemia   . Migraines   . Obesity, unspecified     Past Surgical History:  Procedure Laterality Date  . 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   Procedure: 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.;  Surgeon: Jolyn Nap, MD;  Location: [place];  Service: Orthopedics;  Laterality: Left;  . FINGER DEBRIDEMENT Left 09/25/2013   Thumb  . FOOT FRACTURE SURGERY      Family History  Problem Relation Age of Onset  . Diabetes Mother   . Hypertension Mother   . Hypertension Father   . Arthritis Father   . Diabetes Father     Current Outpatient Medications on File Prior to Visit  Medication Sig Dispense Refill  . amLODipine (NORVASC) 10 MG tablet Take 1 tablet (10 mg total) by mouth daily. 30 tablet 3  . atorvastatin (LIPITOR) 40 MG tablet Take 1 tablet (40 mg total) by mouth daily at 6 PM. 90 tablet 2  . carvedilol (COREG) 25 MG tablet Take 1 tablet (25 mg total) by mouth 2 (two) times daily with a meal. 180 tablet 3  . cloNIDine (CATAPRES) 0.3 MG tablet TAKE 1 TABLET BY MOUTH TWICE DAILY 60 tablet 3  . hydrALAZINE (APRESOLINE) 100 MG tablet  TAKE 1 TABLET BY MOUTH THREE TIMES DAILY 90 tablet 0  . HYDROcodone-acetaminophen (NORCO) 7.5-325 MG tablet Take 1 tablet by mouth every 4 (four) hours as needed. 15 tablet 0  . hydrOXYzine (VISTARIL) 25 MG capsule Take 1 capsule (25 mg total) by mouth every 8 (eight) hours as needed. 30 capsule 1  . nitroGLYCERIN (NITROSTAT) 0.4 MG SL tablet Place 1 tablet (0.4 mg total) under the tongue every 5 (five) minutes as needed for chest pain. 25 tablet 1  . ondansetron (ZOFRAN) 4 MG tablet One tablet once daily , as needed, for nausea 30 tablet 1   No current facility-administered medications on file prior to visit.     Allergies  Allergen Reactions  . Bee Venom Anaphylaxis  . Prochlorperazine Edisylate Other (See Comments)    Reaction: Jittery,uncomfortable feeling  . Cyclobenzaprine Hcl Other (See Comments)    REACTION: feel anxious, nausea  . Aleve [Naproxen Sodium] Nausea And Vomiting    States that this medication also causes abdominal pain  . Carbamazepine     REACTION: unknown reaction  . Geodon [Ziprasidone Hcl] Nausea And Vomiting  . Ibuprofen Nausea And Vomiting    States that this medication also causes abdominal pain  . Maxzide [Hydrochlorothiazide W-Triamterene]     headache  . Nsaids Other (See Comments)  H/o severe anemia from UGIB from ulcer  . Tylenol [Acetaminophen] Nausea And Vomiting    Patient states that this medication also causes abdominal pain  . Other Nausea And Vomiting, Rash and Other (See Comments)    ALL MUSCLE RELAXANTS: states that they affect sciatic nerve, may cause nausea and vomiting, rash    Social History   Substance and Sexual Activity  Alcohol Use No    Social History   Tobacco Use  Smoking Status Current Every Day Smoker  . Packs/day: 0.10  . Types: Cigarettes  . Last attempt to quit: 05/28/2012  . Years since quitting: 6.3  Smokeless Tobacco Never Used    Review of Systems  Constitutional: Positive for malaise/fatigue.  HENT:  Negative.   Eyes: Negative.   Respiratory: Negative.   Cardiovascular: Negative.   Gastrointestinal: Positive for abdominal pain, heartburn, nausea and vomiting.  Genitourinary: Negative.   Musculoskeletal: Negative.   Skin: Negative.   Neurological: Positive for headaches.  Endo/Heme/Allergies: Negative.   Psychiatric/Behavioral: Negative.     Objective   Vitals:   09/25/18 1248  BP: (!) 179/107  Pulse: 97  Resp: 20  Temp: 98.2 F (36.8 C)    Physical Exam Vitals signs reviewed.  Constitutional:      Appearance: He is well-developed. He is not ill-appearing.  HENT:     Head: Normocephalic and atraumatic.  Eyes:     General: No scleral icterus. Cardiovascular:     Rate and Rhythm: Normal rate and regular rhythm.     Heart sounds: Normal heart sounds. No murmur. No gallop.   Pulmonary:     Effort: Pulmonary effort is normal. No respiratory distress.     Breath sounds: Normal breath sounds. No stridor. No wheezing or rhonchi.  Abdominal:     General: Abdomen is flat. Bowel sounds are normal. There is no distension.     Palpations: Abdomen is soft. There is no hepatomegaly.     Tenderness: There is abdominal tenderness. There is guarding. Positive signs include Murphy's sign.     Hernia: No hernia is present.     Comments: Discomfort noted in the right upper quadrant to palpation.  Skin:    General: Skin is warm and dry.  Neurological:     Mental Status: He is alert and oriented to person, place, and time.    CT scan report reviewed.  Cholelithiasis seen. Assessment  Biliary colic, cholelithiasis Plan   Patient is scheduled for laparoscopic cholecystectomy on 10/03/2018.  The risks and benefits of the procedure including bleeding, infection, hepatobiliary injury, and the possibility of an open procedure were fully explained to the patient, who gave informed consent.

## 2018-09-26 NOTE — Patient Instructions (Signed)
[name]  09/26/2018     @PREFPERIOPPHARMACY @   Your procedure is scheduled on  10/03/2018.  Report to [facility] at  615  A.M.  Call this number if you have problems the morning of surgery:  [phone]   Remember:  Do not eat or drink after midnight.                        Take these medicines the morning of surgery with A SIP OF WATER  Amlodipine, carvedilol, clonidine, hydrocodone ( if needed), zofran ( if needed).    Do not wear jewelry, make-up or nail polish.  Do not wear lotions, powders, or perfumes, or deodorant.  Do not shave 48 hours prior to surgery.  Men may shave face and neck.  Do not bring valuables to the hospital.  [facility] is not responsible for any belongings or valuables.  Contacts, dentures or bridgework may not be worn into surgery.  Leave your suitcase in the car.  After surgery it may be brought to your room.  For patients admitted to the hospital, discharge time will be determined by your treatment team.  Patients discharged the day of surgery will not be allowed to drive home.   Name and phone number of your driver:   family Special instructions:  None  Please read over the following fact sheets that you were given. Anesthesia Post-op Instructions and Care and Recovery After Surgery       Laparoscopic Cholecystectomy Laparoscopic cholecystectomy is surgery to remove the gallbladder. The gallbladder is a pear-shaped organ that lies beneath the liver on the right side of the body. The gallbladder stores bile, which is a fluid that helps the body to digest fats. Cholecystectomy is often done for inflammation of the gallbladder (cholecystitis). This condition is usually caused by a buildup of gallstones (cholelithiasis) in the gallbladder. Gallstones can block the flow of bile, which can result in inflammation and pain. In severe cases, emergency surgery may be required. This procedure is done though small incisions in your  abdomen (laparoscopic surgery). A thin scope with a camera (laparoscope) is inserted through one incision. Thin surgical instruments are inserted through the other incisions. In some cases, a laparoscopic procedure may be turned into a type of surgery that is done through a larger incision (open surgery). Tell a health care provider about:  Any allergies you have.  All medicines you are taking, including vitamins, herbs, eye drops, creams, and over-the-counter medicines.  Any problems you or family members have had with anesthetic medicines.  Any blood disorders you have.  Any surgeries you have had.  Any medical conditions you have.  Whether you are pregnant or may be pregnant. What are the risks? Generally, this is a safe procedure. However, problems may occur, including:  Infection.  Bleeding.  Allergic reactions to medicines.  Damage to other structures or organs.  A stone remaining in the common bile duct. The common bile duct carries bile from the gallbladder into the small intestine.  A bile leak from the cyst duct that is clipped when your gallbladder is removed. What happens before the procedure? Staying hydrated Follow instructions from your health care provider about hydration, which may include:  Up to 2 hours before the procedure - you may continue to drink clear liquids, such as water, clear fruit juice, black coffee, and plain tea. Eating and drinking restrictions Follow instructions  from your health care provider about eating and drinking, which may include:  8 hours before the procedure - stop eating heavy meals or foods such as meat, fried foods, or fatty foods.  6 hours before the procedure - stop eating light meals or foods, such as toast or cereal.  6 hours before the procedure - stop drinking milk or drinks that contain milk.  2 hours before the procedure - stop drinking clear liquids. Medicines  Ask your health care provider about: ? Changing or  stopping your regular medicines. This is especially important if you are taking diabetes medicines or blood thinners. ? Taking medicines such as aspirin and ibuprofen. These medicines can thin your blood. Do not take these medicines before your procedure if your health care provider instructs you not to.  You may be given antibiotic medicine to help prevent infection. General instructions  Let your health care provider know if you develop a cold or an infection before surgery.  Plan to have someone take you home from the hospital or clinic.  Ask your health care provider how your surgical site will be marked or identified. What happens during the procedure?   To reduce your risk of infection: ? Your health care team will wash or sanitize their hands. ? Your skin will be washed with soap. ? Hair may be removed from the surgical area.  An IV tube may be inserted into one of your veins.  You will be given one or more of the following: ? A medicine to help you relax (sedative). ? A medicine to make you fall asleep (general anesthetic).  A breathing tube will be placed in your mouth.  Your surgeon will make several small cuts (incisions) in your abdomen.  The laparoscope will be inserted through one of the small incisions. The camera on the laparoscope will send images to a TV screen (monitor) in the operating room. This lets your surgeon see inside your abdomen.  Air-like gas will be pumped into your abdomen. This will expand your abdomen to give the surgeon more room to perform the surgery.  Other tools that are needed for the procedure will be inserted through the other incisions. The gallbladder will be removed through one of the incisions.  Your common bile duct may be examined. If stones are found in the common bile duct, they may be removed.  After your gallbladder has been removed, the incisions will be closed with stitches (sutures), staples, or skin glue.  Your incisions  may be covered with a bandage (dressing). The procedure may vary among health care providers and hospitals. What happens after the procedure?  Your blood pressure, heart rate, breathing rate, and blood oxygen level will be monitored until the medicines you were given have worn off.  You will be given medicines as needed to control your pain.  Do not drive for 24 hours if you were given a sedative. This information is not intended to replace advice given to you by your health care provider. Make sure you discuss any questions you have with your health care provider. Document Released: 09/05/2005 Document Revised: 08/03/2017 Document Reviewed: 02/22/2016 Elsevier Interactive Patient Education  2019 [employer]  Laparoscopic Cholecystectomy, Care After This sheet gives you information about how to care for yourself after your procedure. Your health care provider may also give you more specific instructions. If you have problems or questions, contact your health care provider. What can I expect after the procedure? After the procedure, it is  common to have:  Pain at your incision sites. You will be given medicines to control this pain.  Mild nausea or vomiting.  Bloating and possible shoulder pain from the air-like gas that was used during the procedure. Follow these instructions at home: Incision care   Follow instructions from your health care provider about how to take care of your incisions. Make sure you: ? Wash your hands with soap and water before you change your bandage (dressing). If soap and water are not available, use hand sanitizer. ? Change your dressing as told by your health care provider. ? Leave stitches (sutures), skin glue, or adhesive strips in place. These skin closures may need to be in place for 2 weeks or longer. If adhesive strip edges start to loosen and curl up, you may trim the loose edges. Do not remove adhesive strips completely unless your health care  provider tells you to do that.  Do not take baths, swim, or use a hot tub until your health care provider approves. Ask your health care provider if you can take showers. You may only be allowed to take sponge baths for bathing.  Check your incision area every day for signs of infection. Check for: ? More redness, swelling, or pain. ? More fluid or blood. ? Warmth. ? Pus or a bad smell. Activity  Do not drive or use heavy machinery while taking prescription pain medicine.  Do not lift anything that is heavier than 10 lb (4.5 kg) until your health care provider approves.  Do not play contact sports until your health care provider approves.  Do not drive for 24 hours if you were given a medicine to help you relax (sedative).  Rest as needed. Do not return to work or school until your health care provider approves. General instructions  Take over-the-counter and prescription medicines only as told by your health care provider.  To prevent or treat constipation while you are taking prescription pain medicine, your health care provider may recommend that you: ? Drink enough fluid to keep your urine clear or pale yellow. ? Take over-the-counter or prescription medicines. ? Eat foods that are high in fiber, such as fresh fruits and vegetables, whole grains, and beans. ? Limit foods that are high in fat and processed sugars, such as fried and sweet foods. Contact a health care provider if:  You develop a rash.  You have more redness, swelling, or pain around your incisions.  You have more fluid or blood coming from your incisions.  Your incisions feel warm to the touch.  You have pus or a bad smell coming from your incisions.  You have a fever.  One or more of your incisions breaks open. Get help right away if:  You have trouble breathing.  You have chest pain.  You have increasing pain in your shoulders.  You faint or feel dizzy when you stand.  You have severe pain in  your abdomen.  You have nausea or vomiting that lasts for more than one day.  You have leg pain. This information is not intended to replace advice given to you by your health care provider. Make sure you discuss any questions you have with your health care provider. Document Released: 09/05/2005 Document Revised: 03/26/2016 Document Reviewed: 02/22/2016 Elsevier Interactive Patient Education  2019 [place] Anesthesia is a term that refers to techniques, procedures, and medicines that help a person stay safe and comfortable during a medical procedure. Monitored anesthesia  care, or sedation, is one type of anesthesia. Your anesthesia specialist may recommend sedation if you will be having a procedure that does not require you to be unconscious, such as:  Cataract surgery.  A dental procedure.  A biopsy.  A colonoscopy. During the procedure, you may receive a medicine to help you relax (sedative). There are three levels of sedation:  Mild sedation. At this level, you may feel awake and relaxed. You will be able to follow directions.  Moderate sedation. At this level, you will be sleepy. You may not remember the procedure.  Deep sedation. At this level, you will be asleep. You will not remember the procedure. The more medicine you are given, the deeper your level of sedation will be. Depending on how you respond to the procedure, the anesthesia specialist may change your level of sedation or the type of anesthesia to fit your needs. An anesthesia specialist will monitor you closely during the procedure. Let your health care provider know about:  Any allergies you have.  All medicines you are taking, including vitamins, herbs, eye drops, creams, and over-the-counter medicines.  Any use of steroids (by mouth or as a cream).  Any problems you or family members have had with sedatives and anesthetic medicines.  Any blood disorders you have.  Any  surgeries you have had.  Any medical conditions you have, such as sleep apnea.  Whether you are pregnant or may be pregnant.  Any use of cigarettes, alcohol, or street drugs. What are the risks? Generally, this is a safe procedure. However, problems may occur, including:  Getting too much medicine (oversedation).  Nausea.  Allergic reaction to medicines.  Trouble breathing. If this happens, a breathing tube may be used to help with breathing. It will be removed when you are awake and breathing on your own.  Heart trouble.  Lung trouble. Before the procedure Staying hydrated Follow instructions from your health care provider about hydration, which may include:  Up to 2 hours before the procedure - you may continue to drink clear liquids, such as water, clear fruit juice, black coffee, and plain tea. Eating and drinking restrictions Follow instructions from your health care provider about eating and drinking, which may include:  8 hours before the procedure - stop eating heavy meals or foods such as meat, fried foods, or fatty foods.  6 hours before the procedure - stop eating light meals or foods, such as toast or cereal.  6 hours before the procedure - stop drinking milk or drinks that contain milk.  2 hours before the procedure - stop drinking clear liquids. Medicines Ask your health care provider about:  Changing or stopping your regular medicines. This is especially important if you are taking diabetes medicines or blood thinners.  Taking medicines such as aspirin and ibuprofen. These medicines can thin your blood. Do not take these medicines before your procedure if your health care provider instructs you not to. Tests and exams  You will have a physical exam.  You may have blood tests done to show: ? How well your kidneys and liver are working. ? How well your blood can clot. General instructions  Plan to have someone take you home from the hospital or  clinic.  If you will be going home right after the procedure, plan to have someone with you for 24 hours.  What happens during the procedure?  Your blood pressure, heart rate, breathing, level of pain and overall condition will be monitored.  An  IV tube will be inserted into one of your veins.  Your anesthesia specialist will give you medicines as needed to keep you comfortable during the procedure. This may mean changing the level of sedation.  The procedure will be performed. After the procedure  Your blood pressure, heart rate, breathing rate, and blood oxygen level will be monitored until the medicines you were given have worn off.  Do not drive for 24 hours if you received a sedative.  You may: ? Feel sleepy, clumsy, or nauseous. ? Feel forgetful about what happened after the procedure. ? Have a sore throat if you had a breathing tube during the procedure. ? Vomit. This information is not intended to replace advice given to you by your health care provider. Make sure you discuss any questions you have with your health care provider. Document Released: 06/01/2005 Document Revised: 02/12/2016 Document Reviewed: 12/27/2015 Elsevier Interactive Patient Education  2019 [place], Care After These instructions provide you with information about caring for yourself after your procedure. Your health care provider may also give you more specific instructions. Your treatment has been planned according to current medical practices, but problems sometimes occur. Call your health care provider if you have any problems or questions after your procedure. What can I expect after the procedure? After your procedure, you may:  Feel sleepy for several hours.  Feel clumsy and have poor balance for several hours.  Feel forgetful about what happened after the procedure.  Have poor judgment for several hours.  Feel nauseous or vomit.  Have a sore throat if you  had a breathing tube during the procedure. Follow these instructions at home: For at least 24 hours after the procedure:      Have a responsible adult stay with you. It is important to have someone help care for you until you are awake and alert.  Rest as needed.  Do not: ? Participate in activities in which you could fall or become injured. ? Drive. ? Use heavy machinery. ? Drink alcohol. ? Take sleeping pills or medicines that cause drowsiness. ? Make important decisions or sign legal documents. ? Take care of children on your own. Eating and drinking  Follow the diet that is recommended by your health care provider.  If you vomit, drink water, juice, or soup when you can drink without vomiting.  Make sure you have little or no nausea before eating solid foods. General instructions  Take over-the-counter and prescription medicines only as told by your health care provider.  If you have sleep apnea, surgery and certain medicines can increase your risk for breathing problems. Follow instructions from your health care provider about wearing your sleep device: ? Anytime you are sleeping, including during daytime naps. ? While taking prescription pain medicines, sleeping medicines, or medicines that make you drowsy.  If you smoke, do not smoke without supervision.  Keep all follow-up visits as told by your health care provider. This is important. Contact a health care provider if:  You keep feeling nauseous or you keep vomiting.  You feel light-headed.  You develop a rash.  You have a fever. Get help right away if:  You have trouble breathing. Summary  For several hours after your procedure, you may feel sleepy and have poor judgment.  Have a responsible adult stay with you for at least 24 hours or until you are awake and alert. This information is not intended to replace advice given to you by your health  care provider. Make sure you discuss any questions you have  with your health care provider. Document Released: 12/27/2015 Document Revised: 04/21/2017 Document Reviewed: 12/27/2015 Elsevier Interactive Patient Education  2019 [employer].

## 2018-09-26 NOTE — H&P (Signed)
[name]; [id number]; 10/13/1970   HPI Patient is a 48 year old black male who was referred to my care by Dr. Moshe Cipro for evaluation treatment of right upper quadrant abdominal pain.  Patient has had a history of right upper quadrant abdominal pain radiating around his right flank, nausea, and vomiting over the past few months.  It seems to be worsening.  His pain is 7 out of 10 when the pain occurs.  It is sporadic in nature.  It does seem to be worsening in frequency and intensity.  He denies any fever, chills, or jaundice.  Any food seems to be bringing on the abdominal pain. Past Medical History:  Diagnosis Date  . Chronic back pain   . Duodenitis with bleeding 08/2008   Ulcer - admitted to [facility]   . Essential hypertension, benign   . GERD (gastroesophageal reflux disease)   . Headache(784.0)   . Hyperlipidemia   . Migraines   . Obesity, unspecified     Past Surgical History:  Procedure Laterality Date  . 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 FINGER DEBRIDEMENT Left 09/25/2013   Thumb  . FOOT FRACTURE SURGERY      Family History  Problem Relation Age of Onset  . Diabetes Mother   . Hypertension Mother   . Hypertension Father   . Arthritis Father   . Diabetes Father     Current Outpatient Medications on File Prior to Visit  Medication Sig Dispense Refill  . amLODipine (NORVASC) 10 MG tablet Take 1 tablet (10 mg total) by mouth daily. 30 tablet 3  . atorvastatin (LIPITOR) 40 MG tablet Take 1 tablet (40 mg total) by mouth daily at 6 PM. 90 tablet 2  . carvedilol (COREG) 25 MG tablet Take 1 tablet (25 mg total) by mouth 2 (two) times daily with a meal. 180 tablet 3  . cloNIDine (CATAPRES) 0.3 MG tablet TAKE 1 TABLET BY MOUTH TWICE DAILY 60 tablet 3  . hydrALAZINE (APRESOLINE) 100 MG tablet  TAKE 1 TABLET BY MOUTH THREE TIMES DAILY 90 tablet 0  . HYDROcodone-acetaminophen (NORCO) 7.5-325 MG tablet Take 1 tablet by mouth every 4 (four) hours as needed. 15 tablet 0  . hydrOXYzine (VISTARIL) 25 MG capsule Take 1 capsule (25 mg total) by mouth every 8 (eight) hours as needed. 30 capsule 1  . nitroGLYCERIN (NITROSTAT) 0.4 MG SL tablet Place 1 tablet (0.4 mg total) under the tongue every 5 (five) minutes as needed for chest pain. 25 tablet 1  . ondansetron (ZOFRAN) 4 MG tablet One tablet once daily , as needed, for nausea 30 tablet 1   No current facility-administered medications on file prior to visit.     Allergies  Allergen Reactions  . Bee Venom Anaphylaxis  . Prochlorperazine Edisylate Other (See Comments)    Reaction: Jittery,uncomfortable feeling  . Cyclobenzaprine Hcl Other (See Comments)    REACTION: feel anxious, nausea  . Aleve [Naproxen Sodium] Nausea And Vomiting    States that this medication also causes abdominal pain  . Carbamazepine     REACTION: unknown reaction  . Geodon [Ziprasidone Hcl] Nausea And Vomiting  . Ibuprofen Nausea And Vomiting    States that this medication also causes abdominal pain  . Maxzide [Hydrochlorothiazide W-Triamterene]     headache  . Nsaids Other (See Comments)  H/o severe anemia from UGIB from ulcer  . Tylenol [Acetaminophen] Nausea And Vomiting    Patient states that this medication also causes abdominal pain  . Other Nausea And Vomiting, Rash and Other (See Comments)    ALL MUSCLE RELAXANTS: states that they affect sciatic nerve, may cause nausea and vomiting, rash    Social History   Substance and Sexual Activity  Alcohol Use No    Social History   Tobacco Use  Smoking Status Current Every Day Smoker  . Packs/day: 0.10  . Types: Cigarettes  . Last attempt to quit: 05/28/2012  . Years since quitting: 6.3  Smokeless Tobacco Never Used    Review of Systems  Constitutional: Positive for malaise/fatigue.  HENT:  Negative.   Eyes: Negative.   Respiratory: Negative.   Cardiovascular: Negative.   Gastrointestinal: Positive for abdominal pain, heartburn, nausea and vomiting.  Genitourinary: Negative.   Musculoskeletal: Negative.   Skin: Negative.   Neurological: Positive for headaches.  Endo/Heme/Allergies: Negative.   Psychiatric/Behavioral: Negative.     Objective   Vitals:   09/25/18 1248  BP: (!) 179/107  Pulse: 97  Resp: 20  Temp: 98.2 F (36.8 C)    Physical Exam Vitals signs reviewed.  Constitutional:      Appearance: He is well-developed. He is not ill-appearing.  HENT:     Head: Normocephalic and atraumatic.  Eyes:     General: No scleral icterus. Cardiovascular:     Rate and Rhythm: Normal rate and regular rhythm.     Heart sounds: Normal heart sounds. No murmur. No gallop.   Pulmonary:     Effort: Pulmonary effort is normal. No respiratory distress.     Breath sounds: Normal breath sounds. No stridor. No wheezing or rhonchi.  Abdominal:     General: Abdomen is flat. Bowel sounds are normal. There is no distension.     Palpations: Abdomen is soft. There is no hepatomegaly.     Tenderness: There is abdominal tenderness. There is guarding. Positive signs include Murphy's sign.     Hernia: No hernia is present.     Comments: Discomfort noted in the right upper quadrant to palpation.  Skin:    General: Skin is warm and dry.  Neurological:     Mental Status: He is alert and oriented to person, place, and time.    CT scan report reviewed.  Cholelithiasis seen. Assessment  Biliary colic, cholelithiasis Plan   Patient is scheduled for laparoscopic cholecystectomy on 10/03/2018.  The risks and benefits of the procedure including bleeding, infection, hepatobiliary injury, and the possibility of an open procedure were fully explained to the patient, who gave informed consent.

## 2018-09-27 ENCOUNTER — Encounter (HOSPITAL_COMMUNITY): Payer: Self-pay

## 2018-09-27 ENCOUNTER — Encounter (HOSPITAL_COMMUNITY)
Admission: RE | Admit: 2018-09-27 | Discharge: 2018-09-27 | Disposition: A | Discharge status: Home / Self Care | Payer: BLUE CROSS/BLUE SHIELD | Source: Ambulatory Visit | Attending: General Surgery | Admitting: General Surgery

## 2018-10-02 ENCOUNTER — Encounter (HOSPITAL_COMMUNITY)
Admission: RE | Admit: 2018-10-02 | Discharge: 2018-10-02 | Disposition: A | Discharge status: Home / Self Care | Payer: BLUE CROSS/BLUE SHIELD | Source: Ambulatory Visit | Attending: General Surgery | Admitting: General Surgery

## 2018-10-02 ENCOUNTER — Encounter (HOSPITAL_COMMUNITY): Payer: Self-pay

## 2018-10-02 ENCOUNTER — Other Ambulatory Visit: Payer: Self-pay

## 2018-10-02 DIAGNOSIS — Z01812 Encounter for preprocedural laboratory examination: Secondary | ICD-10-CM | POA: Insufficient documentation

## 2018-10-02 HISTORY — DX: Sleep apnea, unspecified: G47.30

## 2018-10-02 LAB — CBC WITH DIFFERENTIAL/PLATELET
Abs Immature Granulocytes: 0.01 10*3/uL (ref 0.00–0.07)
Basophils Absolute: 0 10*3/uL (ref 0.0–0.1)
Basophils Relative: 0 %
Eosinophils Absolute: 0.1 10*3/uL (ref 0.0–0.5)
Eosinophils Relative: 2 %
HCT: 34.8 % — ABNORMAL LOW (ref 39.0–52.0)
Hemoglobin: 11.4 g/dL — ABNORMAL LOW (ref 13.0–17.0)
Immature Granulocytes: 0 %
Lymphocytes Relative: 24 %
Lymphs Abs: 0.8 10*3/uL (ref 0.7–4.0)
MCH: 32.2 pg (ref 26.0–34.0)
MCHC: 32.8 g/dL (ref 30.0–36.0)
MCV: 98.3 fL (ref 80.0–100.0)
MONO ABS: 0.3 10*3/uL (ref 0.1–1.0)
Monocytes Relative: 8 %
Neutro Abs: 2.2 10*3/uL (ref 1.7–7.7)
Neutrophils Relative %: 66 %
Platelets: 225 10*3/uL (ref 150–400)
RBC: 3.54 MIL/uL — AB (ref 4.22–5.81)
RDW: 11 % — ABNORMAL LOW (ref 11.5–15.5)
WBC: 3.3 10*3/uL — AB (ref 4.0–10.5)
nRBC: 0 % (ref 0.0–0.2)

## 2018-10-02 LAB — COMPREHENSIVE METABOLIC PANEL
ALT: 9 U/L (ref 0–44)
AST: 13 U/L — ABNORMAL LOW (ref 15–41)
Albumin: 4.2 g/dL (ref 3.5–5.0)
Alkaline Phosphatase: 51 U/L (ref 38–126)
Anion gap: 7 (ref 5–15)
BUN: 28 mg/dL — ABNORMAL HIGH (ref 6–20)
CALCIUM: 8.4 mg/dL — AB (ref 8.9–10.3)
CO2: 25 mmol/L (ref 22–32)
Chloride: 106 mmol/L (ref 98–111)
Creatinine, Ser: 2.82 mg/dL — ABNORMAL HIGH (ref 0.61–1.24)
GFR calc Af Amer: 29 mL/min — ABNORMAL LOW (ref >60)
GFR calc non Af Amer: 25 mL/min — ABNORMAL LOW (ref >60)
Glucose, Bld: 76 mg/dL (ref 70–99)
Potassium: 3.5 mmol/L (ref 3.5–5.1)
Sodium: 138 mmol/L (ref 135–145)
Total Bilirubin: 0.8 mg/dL (ref 0.3–1.2)
Total Protein: 6.7 g/dL (ref 6.5–8.1)

## 2018-10-03 ENCOUNTER — Encounter (HOSPITAL_COMMUNITY)
Admission: RE | Disposition: A | Discharge status: Home / Self Care | Payer: Self-pay | Source: Ambulatory Visit | Attending: General Surgery

## 2018-10-03 ENCOUNTER — Encounter (HOSPITAL_COMMUNITY): Payer: Self-pay

## 2018-10-03 ENCOUNTER — Ambulatory Visit (HOSPITAL_COMMUNITY)
Admission: RE | Admit: 2018-10-03 | Discharge: 2018-10-03 | Disposition: A | Discharge status: Home / Self Care | Payer: BLUE CROSS/BLUE SHIELD | Source: Ambulatory Visit | Attending: General Surgery | Admitting: General Surgery

## 2018-10-03 ENCOUNTER — Ambulatory Visit (HOSPITAL_COMMUNITY): Payer: BLUE CROSS/BLUE SHIELD | Admitting: Anesthesiology

## 2018-10-03 DIAGNOSIS — F17210 Nicotine dependence, cigarettes, uncomplicated: Secondary | ICD-10-CM | POA: Diagnosis not present

## 2018-10-03 DIAGNOSIS — K8020 Calculus of gallbladder without cholecystitis without obstruction: Secondary | ICD-10-CM | POA: Diagnosis not present

## 2018-10-03 DIAGNOSIS — K8064 Calculus of gallbladder and bile duct with chronic cholecystitis without obstruction: Secondary | ICD-10-CM | POA: Insufficient documentation

## 2018-10-03 DIAGNOSIS — E785 Hyperlipidemia, unspecified: Secondary | ICD-10-CM | POA: Diagnosis not present

## 2018-10-03 DIAGNOSIS — Z886 Allergy status to analgesic agent status: Secondary | ICD-10-CM | POA: Insufficient documentation

## 2018-10-03 DIAGNOSIS — K219 Gastro-esophageal reflux disease without esophagitis: Secondary | ICD-10-CM | POA: Insufficient documentation

## 2018-10-03 DIAGNOSIS — Z79899 Other long term (current) drug therapy: Secondary | ICD-10-CM | POA: Insufficient documentation

## 2018-10-03 DIAGNOSIS — I10 Essential (primary) hypertension: Secondary | ICD-10-CM | POA: Diagnosis not present

## 2018-10-03 DIAGNOSIS — K811 Chronic cholecystitis: Secondary | ICD-10-CM | POA: Diagnosis not present

## 2018-10-03 HISTORY — PX: CHOLECYSTECTOMY: SHX55

## 2018-10-03 SURGERY — LAPAROSCOPIC CHOLECYSTECTOMY
Anesthesia: General | Site: Abdomen

## 2018-10-03 MED ORDER — LACTATED RINGERS IV SOLN: INTRAVENOUS | Status: DC

## 2018-10-03 MED ORDER — MIDAZOLAM HCL 2 MG/2ML IJ SOLN: 0.5000 mg | Freq: Once | INTRAMUSCULAR | Status: DC | PRN

## 2018-10-03 MED ORDER — PROPOFOL 10 MG/ML IV BOLUS
INTRAVENOUS | Status: DC | PRN
  Administered 2018-10-03: 200 mg via INTRAVENOUS

## 2018-10-03 MED ORDER — ONDANSETRON HCL 4 MG/2ML IJ SOLN
INTRAMUSCULAR | Status: AC
  Filled 2018-10-03: qty 2

## 2018-10-03 MED ORDER — HYDRALAZINE HCL 20 MG/ML IJ SOLN
INTRAMUSCULAR | Status: DC | PRN
  Administered 2018-10-03 (×2): 8 mg via INTRAVENOUS
  Administered 2018-10-03: 4 mg via INTRAVENOUS

## 2018-10-03 MED ORDER — HYDROCODONE-ACETAMINOPHEN 7.5-325 MG PO TABS: 1.0000 | ORAL_TABLET | ORAL | 0 refills | Status: DC | PRN

## 2018-10-03 MED ORDER — PROPOFOL 10 MG/ML IV BOLUS
INTRAVENOUS | Status: AC
  Filled 2018-10-03: qty 40

## 2018-10-03 MED ORDER — ONDANSETRON HCL 4 MG/2ML IJ SOLN
INTRAMUSCULAR | Status: DC | PRN
  Administered 2018-10-03: 4 mg via INTRAVENOUS

## 2018-10-03 MED ORDER — HEMOSTATIC AGENTS (NO CHARGE) OPTIME
TOPICAL | Status: DC | PRN
  Administered 2018-10-03: 1 via TOPICAL

## 2018-10-03 MED ORDER — CIPROFLOXACIN IN D5W 400 MG/200ML IV SOLN
INTRAVENOUS | Status: AC
  Filled 2018-10-03: qty 200

## 2018-10-03 MED ORDER — HYDRALAZINE HCL 20 MG/ML IJ SOLN
INTRAMUSCULAR | Status: AC
  Filled 2018-10-03: qty 1

## 2018-10-03 MED ORDER — SUGAMMADEX SODIUM 500 MG/5ML IV SOLN
INTRAVENOUS | Status: AC
  Filled 2018-10-03: qty 5

## 2018-10-03 MED ORDER — HYDROMORPHONE HCL 1 MG/ML IJ SOLN
0.2500 mg | INTRAMUSCULAR | Status: DC | PRN
  Administered 2018-10-03 (×5): 0.5 mg via INTRAVENOUS
  Filled 2018-10-03 (×3): qty 0.5

## 2018-10-03 MED ORDER — SUCCINYLCHOLINE CHLORIDE 20 MG/ML IJ SOLN
INTRAMUSCULAR | Status: DC | PRN
  Administered 2018-10-03: 140 mg via INTRAVENOUS

## 2018-10-03 MED ORDER — DEXAMETHASONE SODIUM PHOSPHATE 10 MG/ML IJ SOLN
INTRAMUSCULAR | Status: DC | PRN
  Administered 2018-10-03: 5 mg via INTRAVENOUS

## 2018-10-03 MED ORDER — MIDAZOLAM HCL 2 MG/2ML IJ SOLN
INTRAMUSCULAR | Status: AC
  Filled 2018-10-03: qty 2

## 2018-10-03 MED ORDER — 0.9 % SODIUM CHLORIDE (POUR BTL) OPTIME
TOPICAL | Status: DC | PRN
  Administered 2018-10-03: 1000 mL

## 2018-10-03 MED ORDER — POVIDONE-IODINE 10 % OINT PACKET
TOPICAL_OINTMENT | CUTANEOUS | Status: DC | PRN
  Administered 2018-10-03: 1 via TOPICAL

## 2018-10-03 MED ORDER — FENTANYL CITRATE (PF) 100 MCG/2ML IJ SOLN
INTRAMUSCULAR | Status: DC | PRN
  Administered 2018-10-03 (×3): 50 ug via INTRAVENOUS
  Administered 2018-10-03 (×2): 100 ug via INTRAVENOUS

## 2018-10-03 MED ORDER — MIDAZOLAM HCL 5 MG/5ML IJ SOLN
INTRAMUSCULAR | Status: DC | PRN
  Administered 2018-10-03: 2 mg via INTRAVENOUS

## 2018-10-03 MED ORDER — SODIUM CHLORIDE (PF) 0.9 % IJ SOLN
INTRAMUSCULAR | Status: AC
  Filled 2018-10-03: qty 10

## 2018-10-03 MED ORDER — CHLORHEXIDINE GLUCONATE CLOTH 2 % EX PADS: 6.0000 | MEDICATED_PAD | Freq: Once | CUTANEOUS | Status: DC

## 2018-10-03 MED ORDER — POVIDONE-IODINE 10 % EX OINT
TOPICAL_OINTMENT | CUTANEOUS | Status: AC
  Filled 2018-10-03: qty 1

## 2018-10-03 MED ORDER — ROCURONIUM BROMIDE 100 MG/10ML IV SOLN
INTRAVENOUS | Status: DC | PRN
  Administered 2018-10-03: 5 mg via INTRAVENOUS
  Administered 2018-10-03: 45 mg via INTRAVENOUS

## 2018-10-03 MED ORDER — LIDOCAINE HCL 1 % IJ SOLN
INTRAMUSCULAR | Status: DC | PRN
  Administered 2018-10-03: 60 mg via INTRADERMAL

## 2018-10-03 MED ORDER — FENTANYL CITRATE (PF) 250 MCG/5ML IJ SOLN
INTRAMUSCULAR | Status: AC
  Filled 2018-10-03: qty 5

## 2018-10-03 MED ORDER — HYDROMORPHONE HCL 1 MG/ML IJ SOLN
INTRAMUSCULAR | Status: AC
  Filled 2018-10-03: qty 1

## 2018-10-03 MED ORDER — BUPIVACAINE LIPOSOME 1.3 % IJ SUSP
INTRAMUSCULAR | Status: DC | PRN
  Administered 2018-10-03: 20 mL

## 2018-10-03 MED ORDER — FENTANYL CITRATE (PF) 100 MCG/2ML IJ SOLN
INTRAMUSCULAR | Status: AC
  Filled 2018-10-03: qty 4

## 2018-10-03 MED ORDER — BUPIVACAINE LIPOSOME 1.3 % IJ SUSP
INTRAMUSCULAR | Status: AC
  Filled 2018-10-03: qty 20

## 2018-10-03 MED ORDER — LACTATED RINGERS IV SOLN
INTRAVENOUS | Status: DC | PRN
  Administered 2018-10-03 (×2): via INTRAVENOUS

## 2018-10-03 MED ORDER — SUGAMMADEX SODIUM 200 MG/2ML IV SOLN
INTRAVENOUS | Status: DC | PRN
  Administered 2018-10-03 (×2): 150 mg via INTRAVENOUS

## 2018-10-03 MED ORDER — CIPROFLOXACIN IN D5W 400 MG/200ML IV SOLN
400.0000 mg | INTRAVENOUS | Status: AC
  Administered 2018-10-03: 400 mg via INTRAVENOUS

## 2018-10-03 SURGICAL SUPPLY — 48 items
APPLIER CLIP ROT 10 11.4 M/L (STAPLE) ×2
APR CLP MED LRG 11.4X10 (STAPLE) ×1
BAG RETRIEVAL 10 (BASKET) ×1
CHLORAPREP W/TINT 26ML (MISCELLANEOUS) ×2 IMPLANT
CLIP APPLIE ROT 10 11.4 M/L (STAPLE) ×1 IMPLANT
CLOTH BEACON ORANGE TIMEOUT ST (SAFETY) ×2 IMPLANT
COVER LIGHT HANDLE STERIS (MISCELLANEOUS) ×4 IMPLANT
COVER WAND RF STERILE (DRAPES) ×1 IMPLANT
ELECT REM PT RETURN 9FT ADLT (ELECTROSURGICAL) ×2
ELECTRODE REM PT RTRN 9FT ADLT (ELECTROSURGICAL) ×1 IMPLANT
FILTER SMOKE EVAC LAPAROSHD (FILTER) ×2 IMPLANT
GLOVE BIO SURGEON STRL SZ7 (GLOVE) ×1 IMPLANT
GLOVE BIOGEL PI IND STRL 7.0 (GLOVE) ×1 IMPLANT
GLOVE BIOGEL PI INDICATOR 7.0 (GLOVE) ×1
GLOVE ECLIPSE 7.0 STRL STRAW (GLOVE) ×1 IMPLANT
GLOVE SURG SS PI 7.5 STRL IVOR (GLOVE) ×2 IMPLANT
GOWN STRL REUS W/ TWL XL LVL3 (GOWN DISPOSABLE) ×1 IMPLANT
GOWN STRL REUS W/TWL LRG LVL3 (GOWN DISPOSABLE) ×4 IMPLANT
GOWN STRL REUS W/TWL XL LVL3 (GOWN DISPOSABLE) ×2
HEMOSTAT SNOW SURGICEL 2X4 (HEMOSTASIS) ×2 IMPLANT
INST SET LAPROSCOPIC AP (KITS) ×2 IMPLANT
IV NS IRRIG 3000ML ARTHROMATIC (IV SOLUTION) IMPLANT
KIT TURNOVER KIT A (KITS) ×2 IMPLANT
MANIFOLD NEPTUNE II (INSTRUMENTS) ×2 IMPLANT
NDL HYPO 18GX1.5 BLUNT FILL (NEEDLE) ×1 IMPLANT
NDL INSUFFLATION 14GA 120MM (NEEDLE) ×1 IMPLANT
NEEDLE HYPO 18GX1.5 BLUNT FILL (NEEDLE) ×2 IMPLANT
NEEDLE HYPO 22GX1.5 SAFETY (NEEDLE) ×2 IMPLANT
NEEDLE INSUFFLATION 14GA 120MM (NEEDLE) ×2 IMPLANT
NS IRRIG 1000ML POUR BTL (IV SOLUTION) ×2 IMPLANT
PACK LAP CHOLE LZT030E (CUSTOM PROCEDURE TRAY) ×2 IMPLANT
PAD ARMBOARD 7.5X6 YLW CONV (MISCELLANEOUS) ×2 IMPLANT
SET BASIN LINEN APH (SET/KITS/TRAYS/PACK) ×2 IMPLANT
SET TUBE IRRIG SUCTION NO TIP (IRRIGATION / IRRIGATOR) IMPLANT
SLEEVE ENDOPATH XCEL 5M (ENDOMECHANICALS) ×2 IMPLANT
SPONGE GAUZE 2X2 8PLY STRL LF (GAUZE/BANDAGES/DRESSINGS) ×5 IMPLANT
STAPLER VISISTAT (STAPLE) ×2 IMPLANT
SUT VICRYL 0 UR6 27IN ABS (SUTURE) ×2 IMPLANT
SYR 20CC LL (SYRINGE) ×2 IMPLANT
SYS BAG RETRIEVAL 10MM (BASKET) ×1
SYSTEM BAG RETRIEVAL 10MM (BASKET) ×1 IMPLANT
TAPE PAPER 2X10 WHT MICROPORE (GAUZE/BANDAGES/DRESSINGS) ×1 IMPLANT
TROCAR ENDO BLADELESS 11MM (ENDOMECHANICALS) ×2 IMPLANT
TROCAR XCEL NON-BLD 5MMX100MML (ENDOMECHANICALS) ×2 IMPLANT
TROCAR XCEL UNIV SLVE 11M 100M (ENDOMECHANICALS) ×2 IMPLANT
TUBE CONNECTING 12X1/4 (SUCTIONS) ×2 IMPLANT
TUBING INSUFFLATION (TUBING) ×2 IMPLANT
WARMER LAPAROSCOPE (MISCELLANEOUS) ×2 IMPLANT

## 2018-10-03 NOTE — Transfer of Care (Signed)
Immediate Anesthesia Transfer of Care Note  Patient: [name]  Procedure(s) Performed: LAPAROSCOPIC CHOLECYSTECTOMY (N/A Abdomen)  Patient Location: PACU  Anesthesia Type:General  Level of Consciousness: awake and patient cooperative  Airway & Oxygen Therapy: Patient Spontanous Breathing and Patient connected to face mask oxygen  Post-op Assessment: Report given to RN, Post -op Vital signs reviewed and stable and Patient moving all extremities  Post vital signs: Reviewed and stable  Last Vitals:  Vitals Value Taken Time  BP    Temp    Pulse 76 10/03/2018  8:26 AM  Resp 27 10/03/2018  8:26 AM  SpO2 99 % 10/03/2018  8:26 AM  Vitals shown include unvalidated device data.  Last Pain:  Vitals:   10/03/18 0650  TempSrc: Oral  PainSc: 0-No pain      Patients Stated Pain Goal: 9 (70/78/67 5449)  Complications: No apparent anesthesia complications

## 2018-10-03 NOTE — Anesthesia Postprocedure Evaluation (Signed)
Anesthesia Post Note  Patient: [name]  Procedure(s) Performed: LAPAROSCOPIC CHOLECYSTECTOMY (N/A Abdomen)  Patient location during evaluation: PACU Anesthesia Type: General Level of consciousness: patient cooperative and awake Pain management: pain level controlled Vital Signs Assessment: post-procedure vital signs reviewed and stable Respiratory status: nonlabored ventilation, spontaneous breathing and respiratory function stable Cardiovascular status: blood pressure returned to baseline Postop Assessment: no apparent nausea or vomiting Anesthetic complications: no     Last Vitals:  Vitals:   10/03/18 0900 10/03/18 0915  BP: (!) 152/92   Pulse: 60 64  Resp: 20 (!) 9  Temp:    SpO2: 96% 95%    Last Pain:  Vitals:   10/03/18 0915  TempSrc:   PainSc: 9                  [name] J

## 2018-10-03 NOTE — Op Note (Signed)
Patient:  [name]  DOB:  1971-09-03  MRN:  [id number]   Preop Diagnosis: Biliary colic, cholelithiasis  Postop Diagnosis: Same  Procedure: Laparoscopic cholecystectomy  Surgeon: Aviva Signs, MD  Anes: General endotracheal  Indications: Patient is a 48 year old black male who presents with biliary colic secondary to cholelithiasis.  The risks and benefits of the procedure including bleeding, infection, hepatobiliary injury, and the possibility of an open procedure were fully explained to the patient, who gave informed consent.  Procedure note: The patient was placed in supine position.  After induction of general endotracheal anesthesia, the abdomen was prepped and draped using the usual sterile technique with DuraPrep.  Surgical site confirmation was performed.  The supraumbilical incision was made down to the fascia.  A Veress needle was introduced into the abdominal cavity and confirmation of placement was done using the saline drop test.  The abdomen was then insufflated to 16 mmHg pressure.  An 11 mm trocar was introduced into the abdominal cavity under direct visualization without difficulty.  The patient was placed in reverse Trendelenburg position and an additional 11 mm trocar was placed in the epigastric region and 5 mm trochars were placed in the right upper quadrant and right flank regions.  Liver was inspected and noted to be within normal limits.  The gallbladder was retracted in a dynamic fashion in order to provide a critical view of the triangle of Calot.  The cystic duct was first identified.  Its juncture to the infundibulum was fully identified.  Endoclips were placed proximally and distally on the cystic duct, and the cystic duct was divided.  This was likewise done to the cystic artery.  The gallbladder was freed away from the gallbladder fossa using Bovie electrocautery.  Gallbladder was delivered through the epigastric trocar site using an Endo Catch bag.  The  gallbladder fossa was inspected and no abnormal bleeding or bile leakage was noted.  Surgicel was placed in the gallbladder fossa.  All fluid and air were then evacuated from the abdominal cavity prior to the removal of the trochars.  All wounds were irrigated with normal saline.  All wounds were injected with Exparel.  The supraumbilical fascia was reapproximated using an 0 Vicryl interrupted suture.  All skin incisions were closed using staples.  Betadine ointment and dry sterile dressings were applied.  All tape and needle counts were correct at the end of the procedure.  Patient was extubated in the operating room and transferred to PACU in stable condition.  Complications: None  EBL: Minimal  Specimen: Gallbladder

## 2018-10-03 NOTE — Addendum Note (Signed)
Addendum  created 10/03/18 3692 by Lenice Llamas, MD   Attestation recorded in [place], [place] filed

## 2018-10-03 NOTE — Interval H&P Note (Signed)
History and Physical Interval Note:  10/03/2018 7:12 AM  [name]  has presented today for surgery, with the diagnosis of cholelithiasis  The various methods of treatment have been discussed with the patient and family. After consideration of risks, benefits and other options for treatment, the patient has consented to  Procedure(s): LAPAROSCOPIC CHOLECYSTECTOMY (N/A) as a surgical intervention .  The patient's history has been reviewed, patient examined, no change in status, stable for surgery.  I have reviewed the patient's chart and labs.  Questions were answered to the patient's satisfaction.     Aviva Signs

## 2018-10-03 NOTE — Anesthesia Preprocedure Evaluation (Signed)
Anesthesia Evaluation  Patient identified by MRN, date of birth, ID band Patient awake    Reviewed: Allergy & Precautions, NPO status , Patient's Chart, lab work & pertinent test results  Airway Mallampati: II  TM Distance: >3 FB Neck ROM: Full    Dental no notable dental hx. (+) Teeth Intact   Pulmonary sleep apnea and Continuous Positive Airway Pressure Ventilation , Current Smoker,    Pulmonary exam normal breath sounds clear to auscultation       Cardiovascular Exercise Tolerance: Good hypertension, Pt. on medications negative cardio ROS Normal cardiovascular examI Rhythm:Regular Rate:Normal     Neuro/Psych  Headaches, Depression BP up today -states usu normal nervous today negative psych ROS   GI/Hepatic negative GI ROS, Neg liver ROS, Denies GERD when questioned    Endo/Other  negative endocrine ROS  Renal/GU Renal InsufficiencyRenal disease  negative genitourinary   Musculoskeletal negative musculoskeletal ROS (+)   Abdominal   Peds negative pediatric ROS (+)  Hematology negative hematology ROS (+)   Anesthesia Other Findings   Reproductive/Obstetrics negative OB ROS                             Anesthesia Physical Anesthesia Plan  ASA: II  Anesthesia Plan: General   Post-op Pain Management:    Induction: Intravenous  PONV Risk Score and Plan: Ondansetron  Airway Management Planned: Oral ETT  Additional Equipment:   Intra-op Plan:   Post-operative Plan: Extubation in OR  Informed Consent: I have reviewed the patients History and Physical, chart, labs and discussed the procedure including the risks, benefits and alternatives for the proposed anesthesia with the patient or authorized representative who has indicated his/her understanding and acceptance.     Dental advisory given  Plan Discussed with: CRNA  Anesthesia Plan Comments:         Anesthesia Quick  Evaluation

## 2018-10-03 NOTE — Discharge Instructions (Signed)
General Anesthesia, Adult, Care After °This sheet gives you information about how to care for yourself after your procedure. Your health care provider may also give you more specific instructions. If you have problems or questions, contact your health care provider. °What can I expect after the procedure? °After the procedure, the following side effects are common: °· Pain or discomfort at the IV site. °· Nausea. °· Vomiting. °· Sore throat. °· Trouble concentrating. °· Feeling cold or chills. °· Weak or tired. °· Sleepiness and fatigue. °· Soreness and body aches. These side effects can affect parts of the body that were not involved in surgery. °Follow these instructions at home: ° °For at least 24 hours after the procedure: °· Have a responsible adult stay with you. It is important to have someone help care for you until you are awake and alert. °· Rest as needed. °· Do not: °? Participate in activities in which you could fall or become injured. °? Drive. °? Use heavy machinery. °? Drink alcohol. °? Take sleeping pills or medicines that cause drowsiness. °? Make important decisions or sign legal documents. °? Take care of children on your own. °Eating and drinking °· Follow any instructions from your health care provider about eating or drinking restrictions. °· When you feel hungry, start by eating small amounts of foods that are soft and easy to digest (bland), such as toast. Gradually return to your regular diet. °· Drink enough fluid to keep your urine pale yellow. °· If you vomit, rehydrate by drinking water, juice, or clear broth. °General instructions °· If you have sleep apnea, surgery and certain medicines can increase your risk for breathing problems. Follow instructions from your health care provider about wearing your sleep device: °? Anytime you are sleeping, including during daytime naps. °? While taking prescription pain medicines, sleeping medicines, or medicines that make you drowsy. °· Return to  your normal activities as told by your health care provider. Ask your health care provider what activities are safe for you. °· Take over-the-counter and prescription medicines only as told by your health care provider. °· If you smoke, do not smoke without supervision. °· Keep all follow-up visits as told by your health care provider. This is important. °Contact a health care provider if: °· You have nausea or vomiting that does not get better with medicine. °· You cannot eat or drink without vomiting. °· You have pain that does not get better with medicine. °· You are unable to pass urine. °· You develop a skin rash. °· You have a fever. °· You have redness around your IV site that gets worse. °Get help right away if: °· You have difficulty breathing. °· You have chest pain. °· You have blood in your urine or stool, or you vomit blood. °Summary °· After the procedure, it is common to have a sore throat or nausea. It is also common to feel tired. °· Have a responsible adult stay with you for the first 24 hours after general anesthesia. It is important to have someone help care for you until you are awake and alert. °· When you feel hungry, start by eating small amounts of foods that are soft and easy to digest (bland), such as toast. Gradually return to your regular diet. °· Drink enough fluid to keep your urine pale yellow. °· Return to your normal activities as told by your health care provider. Ask your health care provider what activities are safe for you. °This information is not   intended to replace advice given to you by your health care provider. Make sure you discuss any questions you have with your health care provider. Document Released: 12/12/2000 Document Revised: 04/21/2017 Document Reviewed: 04/21/2017 Elsevier Interactive Patient Education  2019 [employer] Laparoscopic Cholecystectomy, Care After This sheet gives you information about how to care for yourself after your procedure. Your health  care provider may also give you more specific instructions. If you have problems or questions, contact your health care provider. What can I expect after the procedure? After the procedure, it is common to have:  Pain at your incision sites. You will be given medicines to control this pain.  Mild nausea or vomiting.  Bloating and possible shoulder pain from the air-like gas that was used during the procedure. Follow these instructions at home: Incision care   Follow instructions from your health care provider about how to take care of your incisions. Make sure you: ? Wash your hands with soap and water before you change your bandage (dressing). If soap and water are not available, use hand sanitizer. ? Change your dressing as told by your health care provider. ? Leave stitches (sutures), skin glue, or adhesive strips in place. These skin closures may need to be in place for 2 weeks or longer. If adhesive strip edges start to loosen and curl up, you may trim the loose edges. Do not remove adhesive strips completely unless your health care provider tells you to do that.  Do not take baths, swim, or use a hot tub until your health care provider approves. Ask your health care provider if you can take showers. You may only be allowed to take sponge baths for bathing.  Check your incision area every day for signs of infection. Check for: ? More redness, swelling, or pain. ? More fluid or blood. ? Warmth. ? Pus or a bad smell. Activity  Do not drive or use heavy machinery while taking prescription pain medicine.  Do not lift anything that is heavier than 10 lb (4.5 kg) until your health care provider approves.  Do not play contact sports until your health care provider approves.  Do not drive for 24 hours if you were given a medicine to help you relax (sedative).  Rest as needed. Do not return to work or school until your health care provider approves. General instructions  Take  over-the-counter and prescription medicines only as told by your health care provider.  To prevent or treat constipation while you are taking prescription pain medicine, your health care provider may recommend that you: ? Drink enough fluid to keep your urine clear or pale yellow. ? Take over-the-counter or prescription medicines. ? Eat foods that are high in fiber, such as fresh fruits and vegetables, whole grains, and beans. ? Limit foods that are high in fat and processed sugars, such as fried and sweet foods. Contact a health care provider if:  You develop a rash.  You have more redness, swelling, or pain around your incisions.  You have more fluid or blood coming from your incisions.  Your incisions feel warm to the touch.  You have pus or a bad smell coming from your incisions.  You have a fever.  One or more of your incisions breaks open. Get help right away if:  You have trouble breathing.  You have chest pain.  You have increasing pain in your shoulders.  You faint or feel dizzy when you stand.  You have severe pain in your  abdomen.  You have nausea or vomiting that lasts for more than one day.  You have leg pain. This information is not intended to replace advice given to you by your health care provider. Make sure you discuss any questions you have with your health care provider. Document Released: 09/05/2005 Document Revised: 03/26/2016 Document Reviewed: 02/22/2016 Elsevier Interactive Patient Education  2019 [employer].

## 2018-10-03 NOTE — Anesthesia Procedure Notes (Signed)
Procedure Name: Intubation Date/Time: 10/03/2018 7:42 AM Performed by: Charmaine Downs, CRNA Pre-anesthesia Checklist: Patient identified, Patient being monitored, Timeout performed, Emergency Drugs available and Suction available Patient Re-evaluated:Patient Re-evaluated prior to induction Oxygen Delivery Method: Circle System Utilized Preoxygenation: Pre-oxygenation with 100% oxygen Induction Type: IV induction Ventilation: Mask ventilation without difficulty Laryngoscope Size: Mac and 3 Grade View: Grade II Tube type: Oral Tube size: 8.0 mm Number of attempts: 1 Airway Equipment and Method: stylet Placement Confirmation: ETT inserted through vocal cords under direct vision,  positive ETCO2 and breath sounds checked- equal and bilateral Secured at: 24 cm Tube secured with: Tape Dental Injury: Teeth and Oropharynx as per pre-operative assessment

## 2018-10-04 ENCOUNTER — Encounter (HOSPITAL_COMMUNITY): Payer: Self-pay | Admitting: General Surgery

## 2018-10-05 ENCOUNTER — Encounter (HOSPITAL_COMMUNITY): Payer: Self-pay | Admitting: General Surgery

## 2018-10-05 DIAGNOSIS — G4733 Obstructive sleep apnea (adult) (pediatric): Secondary | ICD-10-CM | POA: Diagnosis not present

## 2018-10-05 NOTE — Addendum Note (Signed)
Addendum  created 10/05/18 0744 by Vista Deck, CRNA   Intraprocedure Event edited

## 2018-10-09 ENCOUNTER — Ambulatory Visit: Payer: Self-pay | Admitting: General Surgery

## 2018-10-10 ENCOUNTER — Ambulatory Visit: Payer: BLUE CROSS/BLUE SHIELD | Admitting: Family Medicine

## 2018-10-11 ENCOUNTER — Ambulatory Visit (INDEPENDENT_AMBULATORY_CARE_PROVIDER_SITE_OTHER): Payer: Self-pay | Admitting: General Surgery

## 2018-10-11 ENCOUNTER — Encounter: Payer: Self-pay | Admitting: General Surgery

## 2018-10-11 VITALS — BP 177/109 | HR 63 | Temp 98.7°F | Resp 18 | Wt 206.8 lb

## 2018-10-11 DIAGNOSIS — Z09 Encounter for follow-up examination after completed treatment for conditions other than malignant neoplasm: Secondary | ICD-10-CM

## 2018-10-11 NOTE — Progress Notes (Signed)
Subjective:     [name]  Here for postoperative surgery visit.  Patient doing well.  States his preoperative symptoms have resolved.  Is pleased with results. Objective:    BP (!) 177/109 (BP Location: Left Arm, Patient Position: Sitting, Cuff Size: Large)   Pulse 63   Temp 98.7 F (37.1 C)   Resp 18   Wt 206 lb 12.8 oz (93.8 kg)   BMI 30.54 kg/m   General:  alert, cooperative and no distress  Abdomen soft, incisions healing well.  Staples removed, Steri-Strips applied. Final pathology consistent with diagnosis.     Assessment:    Doing well postoperatively.    Plan:   May resume normal activity.  Follow-up here as needed.

## 2018-10-15 ENCOUNTER — Ambulatory Visit: Payer: BLUE CROSS/BLUE SHIELD | Admitting: Family Medicine

## 2018-10-16 ENCOUNTER — Ambulatory Visit (INDEPENDENT_AMBULATORY_CARE_PROVIDER_SITE_OTHER): Payer: BLUE CROSS/BLUE SHIELD | Admitting: Family Medicine

## 2018-10-16 ENCOUNTER — Encounter: Payer: Self-pay | Admitting: Family Medicine

## 2018-10-16 VITALS — BP 148/100 | HR 79 | Resp 12 | Ht 69.0 in | Wt 201.1 lb

## 2018-10-16 DIAGNOSIS — F322 Major depressive disorder, single episode, severe without psychotic features: Secondary | ICD-10-CM

## 2018-10-16 DIAGNOSIS — G43809 Other migraine, not intractable, without status migrainosus: Secondary | ICD-10-CM

## 2018-10-16 DIAGNOSIS — E785 Hyperlipidemia, unspecified: Secondary | ICD-10-CM | POA: Diagnosis not present

## 2018-10-16 DIAGNOSIS — I10 Essential (primary) hypertension: Secondary | ICD-10-CM

## 2018-10-16 DIAGNOSIS — Z125 Encounter for screening for malignant neoplasm of prostate: Secondary | ICD-10-CM

## 2018-10-16 MED ORDER — HYDRALAZINE HCL 100 MG PO TABS: 100.0000 mg | ORAL_TABLET | Freq: Three times a day (TID) | ORAL | 3 refills | Status: DC

## 2018-10-16 MED ORDER — AMLODIPINE BESYLATE 10 MG PO TABS: 10.0000 mg | ORAL_TABLET | Freq: Every day | ORAL | 3 refills | Status: DC

## 2018-10-16 NOTE — Patient Instructions (Signed)
F/U in 3 months, call if you need me before  Fasting lipid, cmp and EGFr, PSA, 1 week before follow up  Need to increase clonidine to THREE times daily, 6 am , 2pm and 10 pm  Please DO commit to therapy   Thankful that your surgery was successful, best for 2020

## 2018-10-19 ENCOUNTER — Other Ambulatory Visit: Payer: Self-pay | Admitting: Family Medicine

## 2018-10-27 ENCOUNTER — Encounter: Payer: Self-pay | Admitting: Family Medicine

## 2018-10-27 NOTE — Assessment & Plan Note (Signed)
Managed by neurology, denies uncontrolled headache currently

## 2018-10-27 NOTE — Assessment & Plan Note (Signed)
Hyperlipidemia:Low fat diet discussed and encouraged.   Lipid Panel  Lab Results  Component Value Date   CHOL 134 11/10/2017   HDL 41 11/10/2017   LDLCALC 76 11/10/2017   TRIG 86 11/10/2017   CHOLHDL 3.3 11/10/2017   Updated lab needed at/ before next visit.

## 2018-10-27 NOTE — Progress Notes (Signed)
   [name]     MRN: [id number]      DOB: 1971/05/04   HPI [name] is here for follow up and re-evaluation of chronic medical conditions, medication management and review of any available recent lab and radiology data.  Preventive health is updated, specifically  Cancer screening and Immunization.   Had lap cholecystectomy on 10/03/2018, and is doing well post op The PT denies any adverse reactions to current medications since the last visit.  He has not started treatment for depression since last visit, states this was because of the surgery  ROS Denies recent fever or chills. Denies sinus pressure, nasal congestion, ear pain or sore throat. Denies chest congestion, productive cough or wheezing. Denies chest pains, palpitations and leg swelling Denies abdominal pain, nausea, vomiting,diarrhea or constipation.   Denies dysuria, frequency, hesitancy or incontinence. Denies joint pain, swelling and limitation in mobility. Denies uncontrolled  headaches, seizures, numbness, or tingling. C/o  depression, anxiety and  Insomnia.Not suicidal or homicidal .   PE  BP (!) 148/100   Pulse 79   Resp 12   Ht 5\' 9"  (1.753 m)   Wt 201 lb 1.9 oz (91.2 kg)   SpO2 97% Comment: room air  BMI 29.70 kg/m   Patient alert and oriented and in no cardiopulmonary distress.  HEENT: No facial asymmetry, EOMI,   oropharynx pink and moist.  Neck supple no JVD, no mass.  Chest: Clear to auscultation bilaterally.  CVS: S1, S2 no murmurs, no S3.Regular rate.  ABD: Soft non tender.   Ext: No edema  MS: Adequate ROM spine, shoulders, hips and knees.  Skin: Intact, no ulcerations or rash noted.  Psych: Good eye contact, normal affect. Memory intact not anxious or depressed appearing.  CNS: CN 2-12 intact, power,  normal throughout.no focal deficits noted.   Assessment & Plan  Malignant hypertension Uncontrolled, increase dose of clonidine  DASH diet and commitment to daily physical  activity for a minimum of 30 minutes discussed and encouraged, as a part of hypertension management. The importance of attaining a healthy weight is also discussed.  BP/Weight 10/16/2018 10/11/2018 10/03/2018 10/02/2018 09/25/2018 09/06/2018 23/95/3202  Systolic BP 334 356 861 683 729 021 115  Diastolic BP 520 802 233 85 107 120 95  Wt. (Lbs) 201.12 206.8 205 205 211.8 210 216.12  BMI 29.7 30.54 30.27 30.27 31.28 31.01 31.92       Depression, major, single episode, severe (HCC) Currently improved, but pt does still need to commit at least to therapy and I explain this to him,nort suicidal or homicidal, but chronic illness id debilitating and this remains a source of stress, still trying to get disability  Hyperlipidemia LDL goal <100 Hyperlipidemia:Low fat diet discussed and encouraged.   Lipid Panel  Lab Results  Component Value Date   CHOL 134 11/10/2017   HDL 41 11/10/2017   LDLCALC 76 11/10/2017   TRIG 86 11/10/2017   CHOLHDL 3.3 11/10/2017   Updated lab needed at/ before next visit.     Migraines Managed by neurology, denies uncontrolled headache currently  '

## 2018-10-27 NOTE — Assessment & Plan Note (Signed)
Currently improved, but pt does still need to commit at least to therapy and I explain this to him,nort suicidal or homicidal, but chronic illness id debilitating and this remains a source of stress, still trying to get disability

## 2018-10-27 NOTE — Assessment & Plan Note (Signed)
Uncontrolled, increase dose of clonidine  DASH diet and commitment to daily physical activity for a minimum of 30 minutes discussed and encouraged, as a part of hypertension management. The importance of attaining a healthy weight is also discussed.  BP/Weight 10/16/2018 10/11/2018 10/03/2018 10/02/2018 09/25/2018 09/06/2018 84/11/3531  Systolic BP 174 099 278 004 471 580 638  Diastolic BP 685 488 301 85 107 120 95  Wt. (Lbs) 201.12 206.8 205 205 211.8 210 216.12  BMI 29.7 30.54 30.27 30.27 31.28 31.01 31.92

## 2018-11-08 DIAGNOSIS — E785 Hyperlipidemia, unspecified: Secondary | ICD-10-CM | POA: Diagnosis not present

## 2018-11-08 DIAGNOSIS — Z125 Encounter for screening for malignant neoplasm of prostate: Secondary | ICD-10-CM | POA: Diagnosis not present

## 2018-11-08 DIAGNOSIS — I10 Essential (primary) hypertension: Secondary | ICD-10-CM | POA: Diagnosis not present

## 2018-11-09 LAB — COMPLETE METABOLIC PANEL WITH GFR
AG Ratio: 2 (calc) (ref 1.0–2.5)
ALBUMIN MSPROF: 4.2 g/dL (ref 3.6–5.1)
ALKALINE PHOSPHATASE (APISO): 72 U/L (ref 35–144)
ALT: 6 U/L — ABNORMAL LOW (ref 9–46)
AST: 13 U/L (ref 10–35)
BUN / CREAT RATIO: 9 (calc) (ref 6–22)
BUN: 26 mg/dL — ABNORMAL HIGH (ref 7–25)
CO2: 30 mmol/L (ref 20–32)
Calcium: 8.9 mg/dL (ref 8.6–10.3)
Chloride: 105 mmol/L (ref 98–110)
Creat: 2.83 mg/dL — ABNORMAL HIGH (ref 0.70–1.33)
GFR, Est African American: 29 mL/min/{1.73_m2} — ABNORMAL LOW (ref >=60)
GFR, Est Non African American: 25 mL/min/{1.73_m2} — ABNORMAL LOW (ref >=60)
Globulin: 2.1 g/dL (calc) (ref 1.9–3.7)
Glucose, Bld: 91 mg/dL (ref 65–99)
Potassium: 4 mmol/L (ref 3.5–5.3)
Sodium: 142 mmol/L (ref 135–146)
Total Bilirubin: 0.5 mg/dL (ref 0.2–1.2)
Total Protein: 6.3 g/dL (ref 6.1–8.1)

## 2018-11-09 LAB — LIPID PANEL
Cholesterol: 123 mg/dL (ref <200)
HDL: 47 mg/dL (ref >=40)
LDL Cholesterol (Calc): 54 mg/dL (calc)
Non-HDL Cholesterol (Calc): 76 mg/dL (calc) (ref <130)
Total CHOL/HDL Ratio: 2.6 (calc) (ref <5.0)
Triglycerides: 134 mg/dL (ref <150)

## 2018-11-09 LAB — PSA: PSA: 1.1 ng/mL (ref <=4.0)

## 2018-11-09 NOTE — Progress Notes (Signed)
Lm 11/09/18

## 2018-11-13 DIAGNOSIS — I10 Essential (primary) hypertension: Secondary | ICD-10-CM

## 2018-11-21 ENCOUNTER — Other Ambulatory Visit: Payer: Self-pay

## 2018-11-21 ENCOUNTER — Telehealth: Payer: Self-pay | Admitting: Family Medicine

## 2018-11-21 MED ORDER — ONDANSETRON HCL 4 MG PO TABS: ORAL_TABLET | ORAL | 1 refills | Status: DC

## 2018-11-21 NOTE — Telephone Encounter (Signed)
Done

## 2018-11-21 NOTE — Telephone Encounter (Signed)
Please call in Nausea medicine

## 2018-12-10 ENCOUNTER — Ambulatory Visit: Payer: BLUE CROSS/BLUE SHIELD | Admitting: Family Medicine

## 2018-12-19 DIAGNOSIS — G4733 Obstructive sleep apnea (adult) (pediatric): Secondary | ICD-10-CM | POA: Diagnosis not present

## 2018-12-19 DIAGNOSIS — G43719 Chronic migraine without aura, intractable, without status migrainosus: Secondary | ICD-10-CM | POA: Diagnosis not present

## 2018-12-19 DIAGNOSIS — Z79891 Long term (current) use of opiate analgesic: Secondary | ICD-10-CM | POA: Diagnosis not present

## 2018-12-19 DIAGNOSIS — I10 Essential (primary) hypertension: Secondary | ICD-10-CM | POA: Diagnosis not present

## 2019-01-08 ENCOUNTER — Ambulatory Visit (INDEPENDENT_AMBULATORY_CARE_PROVIDER_SITE_OTHER): Payer: BLUE CROSS/BLUE SHIELD | Admitting: Family Medicine

## 2019-01-08 ENCOUNTER — Other Ambulatory Visit: Payer: Self-pay

## 2019-01-08 VITALS — BP 160/100 | Ht 69.0 in | Wt 218.0 lb

## 2019-01-08 DIAGNOSIS — G43809 Other migraine, not intractable, without status migrainosus: Secondary | ICD-10-CM

## 2019-01-08 DIAGNOSIS — I10 Essential (primary) hypertension: Secondary | ICD-10-CM

## 2019-01-08 DIAGNOSIS — E669 Obesity, unspecified: Secondary | ICD-10-CM | POA: Diagnosis not present

## 2019-01-08 DIAGNOSIS — G479 Sleep disorder, unspecified: Secondary | ICD-10-CM | POA: Diagnosis not present

## 2019-01-08 DIAGNOSIS — E785 Hyperlipidemia, unspecified: Secondary | ICD-10-CM

## 2019-01-08 MED ORDER — CLONIDINE HCL 0.3 MG PO TABS: 0.3000 mg | ORAL_TABLET | Freq: Three times a day (TID) | ORAL | 3 refills | Status: DC

## 2019-01-08 NOTE — Patient Instructions (Addendum)
F/U  with office visit needs blood pressure evaluated , in 4 to 5 weeks, call if you need me sooner  Please get non fasting chem 7 and EGFR  With magnesium added 1 week before your next visit  Please take carvedilol as prescribed , TWO times daily, 6 am and 6 pm, also use your CPAP machine every night to help to protect your heart and your lungs  It is important that you exercise regularly at least 30 minutes 5 times a week. If you develop chest pain, have severe difficulty breathing, or feel very tired, stop exercising immediately and seek medical attention   QUIT date for cigarettes is 01/02/2019, no more cigarettes or cigars please  By your report you have gained 18 pounds in the past 3 months,  so think about what, how much and how often you are eating and drinking so that you can lose weight and improve your health  Social distancing. Frequent hand washing with soap and water Keeping your hands off of your face and wear a mask when you leave home. These 3 practices will help to keep both you and your community healthy during this time. Please practice them faithfully!  Thanks for choosing [facility], we consider it a privelige to serve you.

## 2019-01-08 NOTE — Progress Notes (Signed)
Virtual Visit via Telephone Note  I connected with [name] on 01/08/19 at  1:40 PM EDT by telephone and verified that I am speaking with the correct person using two identifiers.   I discussed the limitations, risks, security and privacy concerns of performing an evaluation and management service by telephone and the availability of in person appointments. I also discussed with the patient that there may be a patient responsible charge related to this service. The patient expressed understanding and agreed to proceed. Patient is in his home and I am speaking with him from my home, no face to face contact is possible  History of Present Illness: F/u chronic problems and medication review Reports not using CPAP as consistently as he should be , and after discussion  Is committed to changing this Review of Systems  Constitutional: Negative for chills and fever.  HENT: Negative for congestion and sinus pain.   Eyes: Negative for blurred vision.  Respiratory: Negative for cough.   Cardiovascular: Negative for chest pain, palpitations and leg swelling.  Gastrointestinal: Negative for abdominal pain.  Genitourinary: Negative for frequency.  Musculoskeletal: Negative for myalgias.  Neurological: Positive for headaches.       Treated by neurology, and are chronic  Psychiatric/Behavioral: Positive for depression. The patient is not nervous/anxious.        Not suicidal or homicidal, needs therapy but still not commiting       Observations/Objective: BP (!) 160/100   Ht 5\' 9"  (1.753 m)   Wt 218 lb (98.9 kg)   BMI 32.19 kg/m    Assessment and Plan: Malignant hypertension Uncontrolled, not taking carvedilol as prescribed, reviewed in detail medications and correct way to take them, states he will comply DASH diet and commitment to daily physical activity for a minimum of 30 minutes discussed and encouraged, as a part of hypertension management. The importance of attaining a healthy  weight is also discussed.  BP/Weight 01/08/2019 10/16/2018 10/11/2018 10/03/2018 10/02/2018 09/25/2018 66/59/9357  Systolic BP 017 793 903 009 233 007 622  Diastolic BP 633 354 562 563 85 107 120  Wt. (Lbs) 218 201.12 206.8 205 205 211.8 210  BMI 28.76 29.7 30.54 30.27 30.27 31.28 31.01       Obesity (BMI 30.0-34.9) Deteriorated.  Patient re-educated about  the importance of commitment to a  minimum of 150 minutes of exercise per week as able.  The importance of healthy food choices with portion control discussed, as well as eating regularly and within a 12 hour window most days. The need to choose "clean , green" food 50 to 75% of the time is discussed, as well as to make water the primary drink and set a goal of 64 ounces water daily.  Encouraged to start a food diary,  and to consider  joining a support group. Sample diet sheets offered. Goals set by the patient for the next several months.   Weight /BMI 01/08/2019 10/16/2018 10/11/2018  WEIGHT 218 lb 201 lb 1.9 oz 206 lb 12.8 oz  HEIGHT 5\' 9"  5\' 9"  -  BMI 32.19 kg/m2 29.7 kg/m2 30.54 kg/m2      Migraines Currently controlled on medications prescribed by Neurology  Hyperlipidemia LDL goal <100 Hyperlipidemia:Low fat diet discussed and encouraged.   Lipid Panel  Lab Results  Component Value Date   CHOL 123 11/08/2018   HDL 47 11/08/2018   LDLCALC 54 11/08/2018   TRIG 134 11/08/2018   CHOLHDL 2.6 11/08/2018  Controlled, no change in medication  Sleep disorder Discussed the significance of sleep apnea and its negative impact on health, he needs to re commit to daily use and agrees    Follow Up Instructions:    I discussed the assessment and treatment plan with the patient. The patient was provided an opportunity to ask questions and all were answered. The patient agreed with the plan and demonstrated an understanding of the instructions.   The patient was advised to call back or seek an in-person evaluation if  the symptoms worsen or if the condition fails to improve as anticipated.  I provided 25 minutes of non-face-to-face time during this encounter.   Tula Nakayama, MD

## 2019-01-13 ENCOUNTER — Encounter: Payer: Self-pay | Admitting: Family Medicine

## 2019-01-13 NOTE — Assessment & Plan Note (Signed)
Currently controlled on medications prescribed by Neurology

## 2019-01-13 NOTE — Assessment & Plan Note (Signed)
Discussed the significance of sleep apnea and its negative impact on health, he needs to re commit to daily use and agrees

## 2019-01-13 NOTE — Assessment & Plan Note (Signed)
Hyperlipidemia:Low fat diet discussed and encouraged.   Lipid Panel  Lab Results  Component Value Date   CHOL 123 11/08/2018   HDL 47 11/08/2018   LDLCALC 54 11/08/2018   TRIG 134 11/08/2018   CHOLHDL 2.6 11/08/2018  Controlled, no change in medication

## 2019-01-13 NOTE — Assessment & Plan Note (Signed)
Uncontrolled, not taking carvedilol as prescribed, reviewed in detail medications and correct way to take them, states he will comply DASH diet and commitment to daily physical activity for a minimum of 30 minutes discussed and encouraged, as a part of hypertension management. The importance of attaining a healthy weight is also discussed.  BP/Weight 01/08/2019 10/16/2018 10/11/2018 10/03/2018 10/02/2018 09/25/2018 99/27/8004  Systolic BP 471 580 638 685 488 301 415  Diastolic BP 973 312 508 719 85 107 120  Wt. (Lbs) 218 201.12 206.8 205 205 211.8 210  BMI 28.76 29.7 30.54 30.27 30.27 31.28 31.01

## 2019-01-13 NOTE — Assessment & Plan Note (Signed)
Deteriorated.  Patient re-educated about  the importance of commitment to a  minimum of 150 minutes of exercise per week as able.  The importance of healthy food choices with portion control discussed, as well as eating regularly and within a 12 hour window most days. The need to choose "clean , green" food 50 to 75% of the time is discussed, as well as to make water the primary drink and set a goal of 64 ounces water daily.  Encouraged to start a food diary,  and to consider  joining a support group. Sample diet sheets offered. Goals set by the patient for the next several months.   Weight /BMI 01/08/2019 10/16/2018 10/11/2018  WEIGHT 218 lb 201 lb 1.9 oz 206 lb 12.8 oz  HEIGHT 5\' 9"  5\' 9"  -  BMI 32.19 kg/m2 29.7 kg/m2 30.54 kg/m2

## 2019-01-19 ENCOUNTER — Encounter (HOSPITAL_COMMUNITY): Payer: Self-pay | Admitting: Emergency Medicine

## 2019-01-19 ENCOUNTER — Other Ambulatory Visit: Payer: Self-pay

## 2019-01-19 ENCOUNTER — Emergency Department (HOSPITAL_COMMUNITY): Payer: BLUE CROSS/BLUE SHIELD

## 2019-01-19 ENCOUNTER — Emergency Department (HOSPITAL_COMMUNITY)
Admission: EM | Admit: 2019-01-19 | Discharge: 2019-01-19 | Disposition: A | Discharge status: Home / Self Care | Payer: BLUE CROSS/BLUE SHIELD | Attending: Emergency Medicine | Admitting: Emergency Medicine

## 2019-01-19 DIAGNOSIS — R51 Headache: Secondary | ICD-10-CM | POA: Insufficient documentation

## 2019-01-19 DIAGNOSIS — I129 Hypertensive chronic kidney disease with stage 1 through stage 4 chronic kidney disease, or unspecified chronic kidney disease: Secondary | ICD-10-CM | POA: Insufficient documentation

## 2019-01-19 DIAGNOSIS — Z79899 Other long term (current) drug therapy: Secondary | ICD-10-CM | POA: Diagnosis not present

## 2019-01-19 DIAGNOSIS — R1110 Vomiting, unspecified: Secondary | ICD-10-CM | POA: Diagnosis not present

## 2019-01-19 DIAGNOSIS — N179 Acute kidney failure, unspecified: Secondary | ICD-10-CM

## 2019-01-19 DIAGNOSIS — Z87891 Personal history of nicotine dependence: Secondary | ICD-10-CM | POA: Insufficient documentation

## 2019-01-19 DIAGNOSIS — N184 Chronic kidney disease, stage 4 (severe): Secondary | ICD-10-CM | POA: Insufficient documentation

## 2019-01-19 DIAGNOSIS — R112 Nausea with vomiting, unspecified: Secondary | ICD-10-CM | POA: Diagnosis not present

## 2019-01-19 DIAGNOSIS — I161 Hypertensive emergency: Secondary | ICD-10-CM | POA: Diagnosis not present

## 2019-01-19 DIAGNOSIS — I10 Essential (primary) hypertension: Secondary | ICD-10-CM | POA: Diagnosis not present

## 2019-01-19 LAB — COMPREHENSIVE METABOLIC PANEL
ALT: 12 U/L (ref 0–44)
AST: 21 U/L (ref 15–41)
Albumin: 4.5 g/dL (ref 3.5–5.0)
Alkaline Phosphatase: 80 U/L (ref 38–126)
Anion gap: 11 (ref 5–15)
BUN: 32 mg/dL — ABNORMAL HIGH (ref 6–20)
CO2: 27 mmol/L (ref 22–32)
Calcium: 9 mg/dL (ref 8.9–10.3)
Chloride: 103 mmol/L (ref 98–111)
Creatinine, Ser: 3.15 mg/dL — ABNORMAL HIGH (ref 0.61–1.24)
GFR calc Af Amer: 25 mL/min — ABNORMAL LOW (ref >60)
GFR calc non Af Amer: 22 mL/min — ABNORMAL LOW (ref >60)
Glucose, Bld: 131 mg/dL — ABNORMAL HIGH (ref 70–99)
Potassium: 3.3 mmol/L — ABNORMAL LOW (ref 3.5–5.1)
Sodium: 141 mmol/L (ref 135–145)
Total Bilirubin: 0.4 mg/dL (ref 0.3–1.2)
Total Protein: 7.5 g/dL (ref 6.5–8.1)

## 2019-01-19 LAB — TROPONIN I: Troponin I: <0.03 ng/mL (ref <0.03)

## 2019-01-19 LAB — CBC
HCT: 36.3 % — ABNORMAL LOW (ref 39.0–52.0)
Hemoglobin: 12.1 g/dL — ABNORMAL LOW (ref 13.0–17.0)
MCH: 31.8 pg (ref 26.0–34.0)
MCHC: 33.3 g/dL (ref 30.0–36.0)
MCV: 95.3 fL (ref 80.0–100.0)
Platelets: 251 10*3/uL (ref 150–400)
RBC: 3.81 MIL/uL — ABNORMAL LOW (ref 4.22–5.81)
RDW: 11.7 % (ref 11.5–15.5)
WBC: 6.2 10*3/uL (ref 4.0–10.5)
nRBC: 0 % (ref 0.0–0.2)

## 2019-01-19 LAB — LIPASE, BLOOD: Lipase: 92 U/L — ABNORMAL HIGH (ref 11–51)

## 2019-01-19 MED ORDER — PROMETHAZINE HCL 25 MG/ML IJ SOLN
12.5000 mg | Freq: Once | INTRAMUSCULAR | Status: AC
  Administered 2019-01-19: 09:00:00 12.5 mg via INTRAVENOUS
  Filled 2019-01-19: qty 1

## 2019-01-19 MED ORDER — AMLODIPINE BESYLATE 5 MG PO TABS
10.0000 mg | ORAL_TABLET | Freq: Once | ORAL | Status: AC
  Administered 2019-01-19: 10:00:00 10 mg via ORAL
  Filled 2019-01-19: qty 2

## 2019-01-19 MED ORDER — HYDRALAZINE HCL 20 MG/ML IJ SOLN
10.0000 mg | Freq: Once | INTRAMUSCULAR | Status: AC
  Administered 2019-01-19: 16:00:00 10 mg via INTRAVENOUS
  Filled 2019-01-19: qty 1

## 2019-01-19 MED ORDER — ONDANSETRON HCL 8 MG PO TABS: 8.0000 mg | ORAL_TABLET | ORAL | 0 refills | Status: DC | PRN

## 2019-01-19 MED ORDER — HYDRALAZINE HCL 20 MG/ML IJ SOLN
10.0000 mg | Freq: Once | INTRAMUSCULAR | Status: AC
  Administered 2019-01-19: 14:00:00 10 mg via INTRAVENOUS
  Filled 2019-01-19: qty 1

## 2019-01-19 MED ORDER — HYDRALAZINE HCL 25 MG PO TABS
100.0000 mg | ORAL_TABLET | Freq: Once | ORAL | Status: AC
  Administered 2019-01-19: 10:00:00 100 mg via ORAL
  Filled 2019-01-19: qty 4

## 2019-01-19 MED ORDER — CLONIDINE HCL 0.2 MG PO TABS
0.3000 mg | ORAL_TABLET | Freq: Once | ORAL | Status: AC
  Administered 2019-01-19: 0.3 mg via ORAL
  Filled 2019-01-19: qty 1

## 2019-01-19 MED ORDER — ONDANSETRON HCL 4 MG/2ML IJ SOLN
4.0000 mg | Freq: Once | INTRAMUSCULAR | Status: AC
  Administered 2019-01-19: 10:00:00 4 mg via INTRAVENOUS
  Filled 2019-01-19: qty 2

## 2019-01-19 NOTE — ED Triage Notes (Signed)
Pt states that he has been vomiting since he got up this morning

## 2019-01-19 NOTE — Discharge Instructions (Signed)
Your kidney function measured by creatinine was abnormal.  This is not a new finding.  Continue your blood pressure medication.  Prescription for nausea medicine.  Follow-up with your primary care doctor.

## 2019-01-19 NOTE — ED Provider Notes (Signed)
[facility] EMERGENCY DEPARTMENT Provider Note   CSN: [id number] Arrival date & time: 01/19/19  0818    History   Chief Complaint Chief Complaint  Patient presents with  . Emesis    HPI [name] is a 48 y.o. male.     HPI Patient presents with nausea and vomiting.  Began this morning.  Has a dull headache.  History of episodes of nausea and vomiting.  Also severe hypertension.  States he has not taken his blood pressure medicines this morning.  Headache is dull.  No chest pain.  No fevers or chills.  No diarrhea.  No real abdominal pain.  No sick contacts. Past Medical History:  Diagnosis Date  . Chronic back pain   . Duodenitis with bleeding 08/2008   Ulcer - admitted to [facility]   . Essential hypertension, benign   . GERD (gastroesophageal reflux disease)   . Headache(784.0)   . Hyperlipidemia   . Migraines   . Obesity, unspecified   . Sleep apnea     Patient Active Problem List   Diagnosis Date Noted  . Depression, major, single episode, severe ([place]) 08/13/2018  . Cholelithiasis 07/30/2018  . GERD (gastroesophageal reflux disease) 01/06/2018  . Hypertensive cardiomyopathy ([place]) 05/21/2017  . Stress and adjustment reaction 11/04/2016  . Nonspecific abnormal electrocardiogram (ECG) (EKG) 08/30/2016  . Hyperlipidemia LDL goal <100 08/08/2015  . Sleep disorder 11/05/2014  . Obesity (BMI 30.0-34.9) 06/21/2014  . CKD (chronic kidney disease) stage 4, GFR 15-29 ml/min ([facility]) 02/23/2014  . Bilateral renal cysts 02/23/2014  . Fatigue due to sleep pattern disturbance 10/20/2008  . Malignant hypertension 10/09/2007  . Migraines 10/09/2007    Past Surgical History:  Procedure Laterality Date  . 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 CHOLECYSTECTOMY N/A 10/03/2018   Procedure: LAPAROSCOPIC  CHOLECYSTECTOMY;  Surgeon: Aviva Signs, MD;  Location: [facility];  Service: General;  Laterality: N/A;  . FINGER DEBRIDEMENT Left 09/25/2013   Thumb  . FOOT FRACTURE SURGERY Right         Home Medications    Prior to Admission medications   Medication Sig Start Date End Date Taking? Authorizing Provider  amLODipine (NORVASC) 10 MG tablet Take 1 tablet (10 mg total) by mouth daily. 10/16/18  Yes Fayrene Helper, MD  cloNIDine (CATAPRES) 0.3 MG tablet Take 1 tablet (0.3 mg total) by mouth 3 (three) times daily. 01/08/19  Yes Fayrene Helper, MD  hydrALAZINE (APRESOLINE) 100 MG tablet Take 1 tablet (100 mg total) by mouth 3 (three) times daily. 10/16/18  Yes Fayrene Helper, MD  hydrOXYzine (VISTARIL) 50 MG capsule Take 50 mg by mouth 2 (two) times daily as needed for anxiety or itching.  12/03/18  Yes [provider]    Family History Family History  Problem Relation Age of Onset  . Diabetes Mother   . Hypertension Mother   . Hypertension Father   . Arthritis Father   . Diabetes Father     Social History Social History   Tobacco Use  . Smoking status: Former Smoker    Packs/day: 0.10    Years: 15.00    Pack years: 1.50    Types: Cigarettes    Last attempt to quit: 09/15/2012    Years since quitting: 6.3  . Smokeless tobacco: Never Used  Substance Use  Topics  . Alcohol use: No  . Drug use: No     Allergies   Bee venom; Prochlorperazine edisylate; Cyclobenzaprine hcl; Aleve [naproxen sodium]; Carbamazepine; Geodon [ziprasidone hcl]; Ibuprofen; Maxzide [hydrochlorothiazide w-triamterene]; Nsaids; Tylenol [acetaminophen]; and Other   Review of Systems Review of Systems  Constitutional: Negative for appetite change and fever.  HENT: Negative for congestion.   Respiratory: Negative for shortness of breath.   Cardiovascular: Negative for chest pain.  Gastrointestinal: Positive for nausea and vomiting.  Genitourinary: Negative for flank pain.   Musculoskeletal: Negative for back pain.  Skin: Negative for rash.  Neurological: Positive for headaches.  Psychiatric/Behavioral: Negative for confusion.     Physical Exam Updated Vital Signs BP (!) 204/122   Pulse 61   Resp 20   Ht 5\' 9"  (1.753 m)   Wt 98.8 kg   SpO2 98%   BMI 32.17 kg/m   Physical Exam Vitals signs and nursing note reviewed.  Constitutional:      Comments: Sitting in the bed with his eyes closed in an emesis bag over his mouth.  HENT:     Head: Atraumatic.     Mouth/Throat:     Mouth: Mucous membranes are moist.  Pulmonary:     Effort: Pulmonary effort is normal.  Abdominal:     Tenderness: There is no abdominal tenderness.  Musculoskeletal:     Right lower leg: No edema.     Left lower leg: No edema.  Skin:    General: Skin is warm.     Capillary Refill: Capillary refill takes less than 2 seconds.  Neurological:     Mental Status: He is alert. Mental status is at baseline.      ED Treatments / Results  Labs (all labs ordered are listed, but only abnormal results are displayed) Labs Reviewed  COMPREHENSIVE METABOLIC PANEL - Abnormal; Notable for the following components:      Result Value   Potassium 3.3 (*)    Glucose, Bld 131 (*)    BUN 32 (*)    Creatinine, Ser 3.15 (*)    GFR calc non Af Amer 22 (*)    GFR calc Af Amer 25 (*)    All other components within normal limits  LIPASE, BLOOD - Abnormal; Notable for the following components:   Lipase 92 (*)    All other components within normal limits  CBC - Abnormal; Notable for the following components:   RBC 3.81 (*)    Hemoglobin 12.1 (*)    HCT 36.3 (*)    All other components within normal limits  SARS CORONAVIRUS 2 (HOSPITAL ORDER, [place])  TROPONIN I    EKG EKG Interpretation  Date/Time:  Saturday Jan 19 2019 08:59:02 EDT Ventricular Rate:  63 PR Interval:    QRS Duration: 108 QT Interval:  456 QTC Calculation: 467 R Axis:   -36 Text  Interpretation:  Sinus rhythm Left axis deviation Borderline T abnormalities, diffuse leads Confirmed by Davonna Belling [phone]) on 01/19/2019 9:02:13 AM   Radiology No results found.  Procedures Procedures (including critical care time)  Medications Ordered in ED Medications  promethazine (PHENERGAN) injection 12.5 mg (12.5 mg Intravenous Given 01/19/19 0900)  ondansetron (ZOFRAN) injection 4 mg (4 mg Intravenous Given 01/19/19 1003)  hydrALAZINE (APRESOLINE) tablet 100 mg (100 mg Oral Given 01/19/19 1005)  amLODipine (NORVASC) tablet 10 mg (10 mg Oral Given 01/19/19 1004)  cloNIDine (CATAPRES) tablet 0.3 mg (0.3 mg Oral Given 01/19/19 1004)  hydrALAZINE (  APRESOLINE) injection 10 mg (10 mg Intravenous Given 01/19/19 1346)     Initial Impression / Assessment and Plan / ED Course  I have reviewed the triage vital signs and the nursing notes.  Pertinent labs & imaging results that were available during my care of the patient were reviewed by me and considered in my medical decision making (see chart for details).        Patient with nausea and vomiting.  Headache.  Does have severe hypertension.  Had not taken his medicine this morning and appears to have some history of noncompliance.  Continued hypertension.  However is bumped his creatinine up above his baseline.  IV hydralazine given.  At this point with continued headache will get head CT and chest x-ray.  Will likely require admission the hospital.  Care turned over to Dr. Lacinda Axon.  CRITICAL CARE Performed by: Davonna Belling Total critical care time: 30 minutes Critical care time was exclusive of separately billable procedures and treating other patients. Critical care was necessary to treat or prevent imminent or life-threatening deterioration. Critical care was time spent personally by me on the following activities: development of treatment plan with patient and/or surrogate as well as nursing, discussions with consultants, evaluation  of patient's response to treatment, examination of patient, obtaining history from patient or surrogate, ordering and performing treatments and interventions, ordering and review of laboratory studies, ordering and review of radiographic studies, pulse oximetry and re-evaluation of patient's condition.   Final Clinical Impressions(s) / ED Diagnoses   Final diagnoses:  Hypertensive emergency  Nausea and vomiting, intractability of vomiting not specified, unspecified vomiting type    ED Discharge Orders    None       Davonna Belling, MD 01/19/19 1441

## 2019-01-19 NOTE — ED Provider Notes (Signed)
Patient rechecked prior to discharge.  No neuro deficits.  Blood pressure has improved dramatically.  Able to take sips of fluid.  Discharge medications Zofran 8 mg.   Nat Christen, MD 01/19/19 1700

## 2019-01-19 NOTE — ED Notes (Signed)
Patient took sip of water than stated that he would try some later refusing to drink anymore.

## 2019-02-06 ENCOUNTER — Ambulatory Visit: Payer: BLUE CROSS/BLUE SHIELD | Admitting: Family Medicine

## 2019-04-15 DIAGNOSIS — Z79891 Long term (current) use of opiate analgesic: Secondary | ICD-10-CM | POA: Diagnosis not present

## 2019-04-15 DIAGNOSIS — G4733 Obstructive sleep apnea (adult) (pediatric): Secondary | ICD-10-CM | POA: Diagnosis not present

## 2019-04-15 DIAGNOSIS — I10 Essential (primary) hypertension: Secondary | ICD-10-CM | POA: Diagnosis not present

## 2019-04-15 DIAGNOSIS — G43719 Chronic migraine without aura, intractable, without status migrainosus: Secondary | ICD-10-CM | POA: Diagnosis not present

## 2019-05-20 ENCOUNTER — Other Ambulatory Visit: Payer: Self-pay

## 2019-05-20 ENCOUNTER — Other Ambulatory Visit: Payer: Self-pay | Admitting: Family Medicine

## 2019-05-20 ENCOUNTER — Emergency Department (HOSPITAL_COMMUNITY)
Admission: EM | Admit: 2019-05-20 | Discharge: 2019-05-20 | Discharge status: Against Medical Advice | Payer: BC Managed Care – PPO | Attending: Emergency Medicine | Admitting: Emergency Medicine

## 2019-05-20 ENCOUNTER — Encounter (HOSPITAL_COMMUNITY): Payer: Self-pay | Admitting: Emergency Medicine

## 2019-05-20 DIAGNOSIS — R51 Headache: Secondary | ICD-10-CM | POA: Diagnosis not present

## 2019-05-20 DIAGNOSIS — R112 Nausea with vomiting, unspecified: Secondary | ICD-10-CM | POA: Diagnosis not present

## 2019-05-20 DIAGNOSIS — Z5321 Procedure and treatment not carried out due to patient leaving prior to being seen by health care provider: Secondary | ICD-10-CM | POA: Insufficient documentation

## 2019-05-20 MED ORDER — ONDANSETRON 4 MG PO TBDP
4.0000 mg | ORAL_TABLET | Freq: Once | ORAL | Status: AC | PRN
  Administered 2019-05-20: 4 mg via ORAL
  Filled 2019-05-20: qty 1

## 2019-05-20 NOTE — ED Notes (Signed)
Pt states he feels better and wants to leave, advised pt his bp is high and he should stay to be evaluated.  Pt states he is going home to take bp medication

## 2019-05-20 NOTE — ED Triage Notes (Signed)
N/V and headache since this morning.  Pt reports he does this when his bp gets high.  Has not taken his bp at home

## 2019-06-24 ENCOUNTER — Emergency Department (HOSPITAL_COMMUNITY): Payer: BC Managed Care – PPO

## 2019-06-24 ENCOUNTER — Other Ambulatory Visit: Payer: Self-pay

## 2019-06-24 ENCOUNTER — Encounter (HOSPITAL_COMMUNITY): Payer: Self-pay

## 2019-06-24 ENCOUNTER — Inpatient Hospital Stay (HOSPITAL_COMMUNITY)
Admission: EM | Admit: 2019-06-24 | Discharge: 2019-06-29 | DRG: 305 | Disposition: A | Discharge status: Home / Self Care | Payer: BC Managed Care – PPO | Attending: Internal Medicine | Admitting: Internal Medicine

## 2019-06-24 DIAGNOSIS — D631 Anemia in chronic kidney disease: Secondary | ICD-10-CM | POA: Diagnosis not present

## 2019-06-24 DIAGNOSIS — Z23 Encounter for immunization: Secondary | ICD-10-CM

## 2019-06-24 DIAGNOSIS — N179 Acute kidney failure, unspecified: Secondary | ICD-10-CM | POA: Diagnosis not present

## 2019-06-24 DIAGNOSIS — R1110 Vomiting, unspecified: Secondary | ICD-10-CM | POA: Diagnosis not present

## 2019-06-24 DIAGNOSIS — S37009A Unspecified injury of unspecified kidney, initial encounter: Secondary | ICD-10-CM

## 2019-06-24 DIAGNOSIS — I161 Hypertensive emergency: Secondary | ICD-10-CM | POA: Diagnosis not present

## 2019-06-24 DIAGNOSIS — E860 Dehydration: Secondary | ICD-10-CM | POA: Diagnosis present

## 2019-06-24 DIAGNOSIS — N189 Chronic kidney disease, unspecified: Secondary | ICD-10-CM

## 2019-06-24 DIAGNOSIS — R519 Headache, unspecified: Secondary | ICD-10-CM | POA: Diagnosis not present

## 2019-06-24 DIAGNOSIS — A419 Sepsis, unspecified organism: Secondary | ICD-10-CM

## 2019-06-24 DIAGNOSIS — K219 Gastro-esophageal reflux disease without esophagitis: Secondary | ICD-10-CM | POA: Diagnosis not present

## 2019-06-24 DIAGNOSIS — I10 Essential (primary) hypertension: Secondary | ICD-10-CM | POA: Diagnosis not present

## 2019-06-24 DIAGNOSIS — R739 Hyperglycemia, unspecified: Secondary | ICD-10-CM | POA: Diagnosis present

## 2019-06-24 DIAGNOSIS — R112 Nausea with vomiting, unspecified: Secondary | ICD-10-CM | POA: Diagnosis not present

## 2019-06-24 DIAGNOSIS — G4733 Obstructive sleep apnea (adult) (pediatric): Secondary | ICD-10-CM | POA: Diagnosis present

## 2019-06-24 DIAGNOSIS — I491 Atrial premature depolarization: Secondary | ICD-10-CM | POA: Diagnosis present

## 2019-06-24 DIAGNOSIS — N184 Chronic kidney disease, stage 4 (severe): Secondary | ICD-10-CM | POA: Diagnosis not present

## 2019-06-24 DIAGNOSIS — E785 Hyperlipidemia, unspecified: Secondary | ICD-10-CM | POA: Diagnosis not present

## 2019-06-24 DIAGNOSIS — I1A0 Resistant hypertension: Secondary | ICD-10-CM | POA: Diagnosis present

## 2019-06-24 DIAGNOSIS — G43909 Migraine, unspecified, not intractable, without status migrainosus: Secondary | ICD-10-CM | POA: Diagnosis not present

## 2019-06-24 DIAGNOSIS — Z79899 Other long term (current) drug therapy: Secondary | ICD-10-CM | POA: Diagnosis not present

## 2019-06-24 DIAGNOSIS — I129 Hypertensive chronic kidney disease with stage 1 through stage 4 chronic kidney disease, or unspecified chronic kidney disease: Secondary | ICD-10-CM | POA: Diagnosis present

## 2019-06-24 DIAGNOSIS — R4182 Altered mental status, unspecified: Secondary | ICD-10-CM | POA: Diagnosis not present

## 2019-06-24 DIAGNOSIS — R4020 Unspecified coma: Secondary | ICD-10-CM | POA: Diagnosis not present

## 2019-06-24 DIAGNOSIS — N289 Disorder of kidney and ureter, unspecified: Secondary | ICD-10-CM | POA: Diagnosis not present

## 2019-06-24 DIAGNOSIS — Z87891 Personal history of nicotine dependence: Secondary | ICD-10-CM

## 2019-06-24 DIAGNOSIS — Z20828 Contact with and (suspected) exposure to other viral communicable diseases: Secondary | ICD-10-CM | POA: Diagnosis present

## 2019-06-24 DIAGNOSIS — R0602 Shortness of breath: Secondary | ICD-10-CM | POA: Diagnosis not present

## 2019-06-24 LAB — COMPREHENSIVE METABOLIC PANEL
ALT: 14 U/L (ref 0–44)
AST: 25 U/L (ref 15–41)
Albumin: 4.7 g/dL (ref 3.5–5.0)
Alkaline Phosphatase: 59 U/L (ref 38–126)
Anion gap: 12 (ref 5–15)
BUN: 24 mg/dL — ABNORMAL HIGH (ref 6–20)
CO2: 23 mmol/L (ref 22–32)
Calcium: 8.9 mg/dL (ref 8.9–10.3)
Chloride: 108 mmol/L (ref 98–111)
Creatinine, Ser: 2.83 mg/dL — ABNORMAL HIGH (ref 0.61–1.24)
GFR calc Af Amer: 29 mL/min — ABNORMAL LOW (ref >60)
GFR calc non Af Amer: 25 mL/min — ABNORMAL LOW (ref >60)
Glucose, Bld: 121 mg/dL — ABNORMAL HIGH (ref 70–99)
Potassium: 3.4 mmol/L — ABNORMAL LOW (ref 3.5–5.1)
Sodium: 143 mmol/L (ref 135–145)
Total Bilirubin: 1.1 mg/dL (ref 0.3–1.2)
Total Protein: 7.4 g/dL (ref 6.5–8.1)

## 2019-06-24 LAB — TROPONIN I (HIGH SENSITIVITY)
Troponin I (High Sensitivity): 19 ng/L — ABNORMAL HIGH (ref <18)
Troponin I (High Sensitivity): 17 ng/L (ref <18)

## 2019-06-24 LAB — SARS CORONAVIRUS 2 BY RT PCR (HOSPITAL ORDER, PERFORMED IN ~~LOC~~ HOSPITAL LAB): SARS Coronavirus 2: NEGATIVE

## 2019-06-24 LAB — CBC
HCT: 35.4 % — ABNORMAL LOW (ref 39.0–52.0)
Hemoglobin: 11.8 g/dL — ABNORMAL LOW (ref 13.0–17.0)
MCH: 32.1 pg (ref 26.0–34.0)
MCHC: 33.3 g/dL (ref 30.0–36.0)
MCV: 96.2 fL (ref 80.0–100.0)
Platelets: 257 10*3/uL (ref 150–400)
RBC: 3.68 MIL/uL — ABNORMAL LOW (ref 4.22–5.81)
RDW: 10.9 % — ABNORMAL LOW (ref 11.5–15.5)
WBC: 6.8 10*3/uL (ref 4.0–10.5)
nRBC: 0 % (ref 0.0–0.2)

## 2019-06-24 LAB — LACTIC ACID, PLASMA: Lactic Acid, Venous: 2 mmol/L (ref 0.5–1.9)

## 2019-06-24 MED ORDER — SODIUM CHLORIDE 0.9 % IV BOLUS
1000.0000 mL | Freq: Once | INTRAVENOUS | Status: AC
  Administered 2019-06-24: 1000 mL via INTRAVENOUS

## 2019-06-24 MED ORDER — HYDRALAZINE HCL 20 MG/ML IJ SOLN
10.0000 mg | Freq: Once | INTRAMUSCULAR | Status: AC
  Administered 2019-06-24: 18:00:00 10 mg via INTRAVENOUS
  Filled 2019-06-24: qty 1

## 2019-06-24 MED ORDER — IOHEXOL 350 MG/ML SOLN: 100.0000 mL | Freq: Once | INTRAVENOUS | Status: DC | PRN

## 2019-06-24 MED ORDER — ONDANSETRON HCL 4 MG/2ML IJ SOLN
4.0000 mg | Freq: Once | INTRAMUSCULAR | Status: AC
  Administered 2019-06-24: 4 mg via INTRAVENOUS
  Filled 2019-06-24: qty 2

## 2019-06-24 MED ORDER — CLEVIDIPINE BUTYRATE 0.5 MG/ML IV EMUL
0.0000 mg/h | INTRAVENOUS | Status: DC
  Administered 2019-06-24: 20:00:00 1 mg/h via INTRAVENOUS
  Administered 2019-06-24: 23:00:00 6.75 mg/h via INTRAVENOUS
  Administered 2019-06-25: 11:00:00 20 mg/h via INTRAVENOUS
  Administered 2019-06-25: 10:00:00 21 mg/h via INTRAVENOUS
  Administered 2019-06-25: 14 mg/h via INTRAVENOUS
  Administered 2019-06-25: 16 mg/h via INTRAVENOUS
  Filled 2019-06-24 (×11): qty 50

## 2019-06-24 NOTE — ED Notes (Signed)
Abby, RN, AC on phone, talking with [facility], need to get pt transported quickly to [facility] as this is the last bottle of medication (Cleviprex), was told that pt was next to transport, but all trucks on covid calls at this time. Will be approx 2 hours.

## 2019-06-24 NOTE — ED Notes (Signed)
Pt gone to CT 

## 2019-06-24 NOTE — ED Notes (Signed)
EDP notified of pt 

## 2019-06-24 NOTE — ED Notes (Signed)
Pt vomiting- EDP notified - new orders received for zofran

## 2019-06-24 NOTE — ED Notes (Signed)
CRITICAL VALUE ALERT  Critical Value:  Lactic 2.0  Date & Time Notied:  06/24/2019 1914  Provider Notified: Dr. Sedonia Small  Orders Received/Actions taken: See chart

## 2019-06-24 NOTE — ED Provider Notes (Addendum)
[facility] Emergency Department Provider Note MRN:  [id number]  Arrival date & time: 06/24/19     Chief Complaint   Emesis   History of Present Illness   [name] is a 48 y.o. year-old male with a history of malignant hypertension presenting to the ED with chief complaint of emesis.  Patient felt well yesterday, woke up with severe headache, nausea, vomiting, patient is confused.  Denies chest pain, no shortness of breath, no abdominal pain, no numbness or weakness to the arms or legs.  I was unable to obtain an accurate HPI, PMH, or ROS due to the patient's altered mental status.  Level 5 caveat.  Review of Systems  A complete 10 system review of systems was obtained and all systems are negative except as noted in the HPI and PMH.   Patient's Health History    Past Medical History:  Diagnosis Date  . Chronic back pain   . Duodenitis with bleeding 08/2008   Ulcer - admitted to [facility]   . Essential hypertension, benign   . GERD (gastroesophageal reflux disease)   . Headache(784.0)   . Hyperlipidemia   . Migraines   . Obesity, unspecified   . Sleep apnea     Past Surgical History:  Procedure Laterality Date  . 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   Procedure: 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.;  Surgeon: Jolyn Nap, MD;  Location: [place];  Service: Orthopedics;  Laterality: Left;  . CHOLECYSTECTOMY N/A 10/03/2018   Procedure: LAPAROSCOPIC CHOLECYSTECTOMY;  Surgeon: Aviva Signs, MD;  Location: [facility];  Service: General;  Laterality: N/A;  . FINGER DEBRIDEMENT Left 09/25/2013   Thumb  . FOOT FRACTURE SURGERY Right     Family History  Problem Relation Age of Onset  . Diabetes Mother   . Hypertension Mother   . Hypertension Father   . Arthritis Father   . Diabetes Father     Social History   Socioeconomic History  . Marital status: Married    Spouse name: Not on file  . Number of children:  4  . Years of education: Not on file  . Highest education level: Not on file  Occupational History  . Occupation: unemployed   Social Needs  . Financial resource strain: Not on file  . Food insecurity    Worry: Not on file    Inability: Not on file  . Transportation needs    Medical: Not on file    Non-medical: Not on file  Tobacco Use  . Smoking status: Former Smoker    Packs/day: 0.10    Years: 15.00    Pack years: 1.50    Types: Cigarettes    Quit date: 09/15/2012    Years since quitting: 6.7  . Smokeless tobacco: Never Used  Substance and Sexual Activity  . Alcohol use: No  . Drug use: No  . Sexual activity: Not Currently  Lifestyle  . Physical activity    Days per week: Not on file    Minutes per session: Not on file  . Stress: Not on file  Relationships  . Social Herbalist on phone: Not on file    Gets together: Not on file    Attends religious service: Not on file    Active member of club or organization: Not on file    Attends meetings of clubs or organizations: Not on file    Relationship status: Not on file  .  Intimate partner violence    Fear of current or ex partner: Not on file    Emotionally abused: Not on file    Physically abused: Not on file    Forced sexual activity: Not on file  Other Topics Concern  . Not on file  Social History Narrative  . Not on file     Physical Exam  Vital Signs and Nursing Notes reviewed Vitals:   06/24/19 2010 06/24/19 2015  BP: (!) 165/89 (!) 187/104  Pulse: 75 87  Resp: 16 20  Temp:    SpO2: 100% 100%    CONSTITUTIONAL: Ill-appearing, diaphoretic NEURO: Somnolent, wakes to voice, oriented to name, following commands, no focal neurological deficits EYES:  eyes equal and reactive ENT/NECK:  no LAD, no JVD CARDIO: Regular rate, well-perfused, normal S1 and S2 PULM:  CTAB no wheezing or rhonchi GI/GU:  normal bowel sounds, non-distended, non-tender MSK/SPINE:  No gross deformities, no edema SKIN:   no rash, atraumatic PSYCH:  Appropriate speech and behavior  Diagnostic and Interventional Summary    EKG Interpretation  Date/Time:  Monday June 24 2019 17:39:29 EDT Ventricular Rate:  54 PR Interval:    QRS Duration: 103 QT Interval:  523 QTC Calculation: 496 R Axis:   88 Text Interpretation:  Sinus rhythm Repol abnrm suggests ischemia, diffuse leads Minimal ST elevation, lateral leads Confirmed by Gerlene Fee [phone]) on 06/24/2019 6:22:32 PM      Labs Reviewed  CBC - Abnormal; Notable for the following components:      Result Value   RBC 3.68 (*)    Hemoglobin 11.8 (*)    HCT 35.4 (*)    RDW 10.9 (*)    All other components within normal limits  COMPREHENSIVE METABOLIC PANEL - Abnormal; Notable for the following components:   Potassium 3.4 (*)    Glucose, Bld 121 (*)    BUN 24 (*)    Creatinine, Ser 2.83 (*)    GFR calc non Af Amer 25 (*)    GFR calc Af Amer 29 (*)    All other components within normal limits  LACTIC ACID, PLASMA - Abnormal; Notable for the following components:   Lactic Acid, Venous 2.0 (*)    All other components within normal limits  TROPONIN I (HIGH SENSITIVITY) - Abnormal; Notable for the following components:   Troponin I (High Sensitivity) 19 (*)    All other components within normal limits  SARS CORONAVIRUS 2 (HOSPITAL ORDER, [facility])  TROPONIN I (HIGH SENSITIVITY)    CT HEAD WO CONTRAST  Final Result    DG Chest Port 1 View  Final Result    MR BRAIN WO CONTRAST    (Results Pending)  MR ANGIO HEAD WO CONTRAST    (Results Pending)  MR ANGIO NECK WO CONTRAST    (Results Pending)    Medications  iohexol (OMNIPAQUE) 350 MG/ML injection 100 mL (has no administration in time range)  clevidipine (CLEVIPREX) infusion 0.5 mg/mL (5 mg/hr Intravenous Rate/Dose Change 06/24/19 2016)  sodium chloride 0.9 % bolus 1,000 mL (0 mLs Intravenous Stopped 06/24/19 2003)  hydrALAZINE (APRESOLINE) injection 10 mg (10 mg  Intravenous Given 06/24/19 1748)     Procedures Critical Care Critical Care Documentation Critical care time provided by me (excluding procedures): 38 minutes  Condition necessitating critical care: Hypertensive emergency versus subarachnoid hemorrhage  Components of critical care management: reviewing of prior records, laboratory and imaging interpretation, frequent re-examination and reassessment of vital signs, administration of IV  hydralazine, IV clevidipine, discussion with consulting services, facilitation of transfer.    ED Course and Medical Decision Making  I have reviewed the triage vital signs and the nursing notes.  Pertinent labs & imaging results that were available during my care of the patient were reviewed by me and considered in my medical decision making (see below for details).  Concern for hypertensive emergency in this 48 year old male with altered mental status, severe hypertension, EKG changes.  Patient is without focal neurological deficits, following commands, will obtain stat CTA head and neck to exclude bleeding such as subarachnoid hemorrhage.  Providing IV hydralazine.  Clinical Course as of Jun 23 2017  Mon Jun 24, 2019  1943 Unable to obtain CTA imaging given patient's kidney function.  Will obtain CT noncontrast.  No obvious bleeding on my read.  Discussed case with Dr. Cheral Marker, will plan to transfer to [facility] as an ED to ED transfer for MRI/MRA imaging.   [MB]    Clinical Course User Index [MB] Maudie Flakes, MD     Dr. Francia Greaves is the accepting ED physician for ED to ED transfer, patient to be transferred to [facility].  11:30 PM update: Unfortunate delay in transport from [facility] to [facility].  Patient continues to have a reassuring neurological exam, converses, slightly confused but with capacity to make decisions.  We discussed of the delay and how lumbar puncture can be beneficial to evaluate for possible bleeding.  Patient is not  interested in lumbar puncture, he is comfortable waiting for the MRI.  Would consider lumbar puncture if patient deteriorates or loses capacity to make this decision.  Barth Kirks. Sedonia Small, Sylvarena [email or web address]  Final Clinical Impressions(s) / ED Diagnoses     ICD-10-CM   1. Hypertensive emergency  I16.1   2. SOB (shortness of breath)  R06.02 DG Chest [facility] 1 View    DG Chest Port 1 View    ED Discharge Orders    None      Discharge Instructions Discussed with and Provided to Patient: Discharge Instructions   None       Maudie Flakes, MD 06/24/19 2019    Maudie Flakes, MD 06/24/19 2342

## 2019-06-24 NOTE — ED Triage Notes (Addendum)
Pt presents to ED with vomiting since this am. Pt also c/o headache started this am 1000. Pt noted to have AMS in triage and unable to fully answer questions without having to repeat more than once.

## 2019-06-24 NOTE — ED Notes (Signed)
Pt is in CT at this time.

## 2019-06-24 NOTE — ED Notes (Signed)
Otila Kluver, RN called Tiffany, RN, [facility] for medication

## 2019-06-24 NOTE — ED Notes (Signed)
Pt spouse called and updated on pt status and plan of care pt pt request.

## 2019-06-25 ENCOUNTER — Emergency Department (HOSPITAL_COMMUNITY): Payer: BC Managed Care – PPO

## 2019-06-25 ENCOUNTER — Encounter (HOSPITAL_COMMUNITY): Payer: Self-pay | Admitting: *Deleted

## 2019-06-25 DIAGNOSIS — D631 Anemia in chronic kidney disease: Secondary | ICD-10-CM | POA: Diagnosis present

## 2019-06-25 DIAGNOSIS — N189 Chronic kidney disease, unspecified: Secondary | ICD-10-CM | POA: Diagnosis not present

## 2019-06-25 DIAGNOSIS — I129 Hypertensive chronic kidney disease with stage 1 through stage 4 chronic kidney disease, or unspecified chronic kidney disease: Secondary | ICD-10-CM | POA: Diagnosis present

## 2019-06-25 DIAGNOSIS — R519 Headache, unspecified: Secondary | ICD-10-CM | POA: Diagnosis not present

## 2019-06-25 DIAGNOSIS — I491 Atrial premature depolarization: Secondary | ICD-10-CM | POA: Diagnosis present

## 2019-06-25 DIAGNOSIS — G43909 Migraine, unspecified, not intractable, without status migrainosus: Secondary | ICD-10-CM | POA: Diagnosis present

## 2019-06-25 DIAGNOSIS — Z87891 Personal history of nicotine dependence: Secondary | ICD-10-CM | POA: Diagnosis not present

## 2019-06-25 DIAGNOSIS — Z20828 Contact with and (suspected) exposure to other viral communicable diseases: Secondary | ICD-10-CM | POA: Diagnosis present

## 2019-06-25 DIAGNOSIS — E785 Hyperlipidemia, unspecified: Secondary | ICD-10-CM | POA: Diagnosis present

## 2019-06-25 DIAGNOSIS — K219 Gastro-esophageal reflux disease without esophagitis: Secondary | ICD-10-CM | POA: Diagnosis present

## 2019-06-25 DIAGNOSIS — I10 Essential (primary) hypertension: Secondary | ICD-10-CM | POA: Diagnosis present

## 2019-06-25 DIAGNOSIS — Z79899 Other long term (current) drug therapy: Secondary | ICD-10-CM | POA: Diagnosis not present

## 2019-06-25 DIAGNOSIS — N179 Acute kidney failure, unspecified: Secondary | ICD-10-CM | POA: Diagnosis present

## 2019-06-25 DIAGNOSIS — R112 Nausea with vomiting, unspecified: Secondary | ICD-10-CM

## 2019-06-25 DIAGNOSIS — R0602 Shortness of breath: Secondary | ICD-10-CM | POA: Diagnosis present

## 2019-06-25 DIAGNOSIS — I1A0 Resistant hypertension: Secondary | ICD-10-CM | POA: Diagnosis present

## 2019-06-25 DIAGNOSIS — E860 Dehydration: Secondary | ICD-10-CM | POA: Diagnosis present

## 2019-06-25 DIAGNOSIS — G4733 Obstructive sleep apnea (adult) (pediatric): Secondary | ICD-10-CM | POA: Diagnosis present

## 2019-06-25 DIAGNOSIS — I161 Hypertensive emergency: Secondary | ICD-10-CM | POA: Diagnosis present

## 2019-06-25 DIAGNOSIS — N184 Chronic kidney disease, stage 4 (severe): Secondary | ICD-10-CM | POA: Diagnosis present

## 2019-06-25 DIAGNOSIS — R739 Hyperglycemia, unspecified: Secondary | ICD-10-CM | POA: Diagnosis present

## 2019-06-25 DIAGNOSIS — Z23 Encounter for immunization: Secondary | ICD-10-CM | POA: Diagnosis not present

## 2019-06-25 LAB — COMPREHENSIVE METABOLIC PANEL
ALT: 13 U/L (ref 0–44)
AST: 23 U/L (ref 15–41)
Albumin: 4.9 g/dL (ref 3.5–5.0)
Alkaline Phosphatase: 65 U/L (ref 38–126)
Anion gap: 14 (ref 5–15)
BUN: 18 mg/dL (ref 6–20)
CO2: 23 mmol/L (ref 22–32)
Calcium: 8.8 mg/dL — ABNORMAL LOW (ref 8.9–10.3)
Chloride: 104 mmol/L (ref 98–111)
Creatinine, Ser: 2.53 mg/dL — ABNORMAL HIGH (ref 0.61–1.24)
GFR calc Af Amer: 33 mL/min — ABNORMAL LOW (ref >60)
GFR calc non Af Amer: 28 mL/min — ABNORMAL LOW (ref >60)
Glucose, Bld: 115 mg/dL — ABNORMAL HIGH (ref 70–99)
Potassium: 2.9 mmol/L — ABNORMAL LOW (ref 3.5–5.1)
Sodium: 141 mmol/L (ref 135–145)
Total Bilirubin: 1.6 mg/dL — ABNORMAL HIGH (ref 0.3–1.2)
Total Protein: 7.6 g/dL (ref 6.5–8.1)

## 2019-06-25 LAB — CBC WITH DIFFERENTIAL/PLATELET
Abs Immature Granulocytes: 0.05 10*3/uL (ref 0.00–0.07)
Basophils Absolute: 0 10*3/uL (ref 0.0–0.1)
Basophils Relative: 0 %
Eosinophils Absolute: 0 10*3/uL (ref 0.0–0.5)
Eosinophils Relative: 0 %
HCT: 39.9 % (ref 39.0–52.0)
Hemoglobin: 14.1 g/dL (ref 13.0–17.0)
Immature Granulocytes: 1 %
Lymphocytes Relative: 5 %
Lymphs Abs: 0.5 10*3/uL — ABNORMAL LOW (ref 0.7–4.0)
MCH: 33.4 pg (ref 26.0–34.0)
MCHC: 35.3 g/dL (ref 30.0–36.0)
MCV: 94.5 fL (ref 80.0–100.0)
Monocytes Absolute: 0.3 10*3/uL (ref 0.1–1.0)
Monocytes Relative: 3 %
Neutro Abs: 8.3 10*3/uL — ABNORMAL HIGH (ref 1.7–7.7)
Neutrophils Relative %: 91 %
Platelets: 293 10*3/uL (ref 150–400)
RBC: 4.22 MIL/uL (ref 4.22–5.81)
RDW: 11.3 % — ABNORMAL LOW (ref 11.5–15.5)
WBC: 9.1 10*3/uL (ref 4.0–10.5)
nRBC: 0 % (ref 0.0–0.2)

## 2019-06-25 LAB — PHOSPHORUS: Phosphorus: 2 mg/dL — ABNORMAL LOW (ref 2.5–4.6)

## 2019-06-25 LAB — URINALYSIS, ROUTINE W REFLEX MICROSCOPIC
Bacteria, UA: NONE SEEN
Bacteria, UA: NONE SEEN
Bilirubin Urine: NEGATIVE
Bilirubin Urine: NEGATIVE
Glucose, UA: NEGATIVE mg/dL
Glucose, UA: NEGATIVE mg/dL
Ketones, ur: 5 mg/dL — AB
Ketones, ur: NEGATIVE mg/dL
Leukocytes,Ua: NEGATIVE
Leukocytes,Ua: NEGATIVE
Nitrite: NEGATIVE
Nitrite: NEGATIVE
Protein, ur: 100 mg/dL — AB
Protein, ur: 100 mg/dL — AB
Specific Gravity, Urine: 1.008 (ref 1.005–1.030)
Specific Gravity, Urine: 1.008 (ref 1.005–1.030)
pH: 8 (ref 5.0–8.0)
pH: 6 (ref 5.0–8.0)

## 2019-06-25 LAB — CBC
HCT: 37.9 % — ABNORMAL LOW (ref 39.0–52.0)
Hemoglobin: 12.9 g/dL — ABNORMAL LOW (ref 13.0–17.0)
MCH: 32.6 pg (ref 26.0–34.0)
MCHC: 34 g/dL (ref 30.0–36.0)
MCV: 95.7 fL (ref 80.0–100.0)
Platelets: 287 10*3/uL (ref 150–400)
RBC: 3.96 MIL/uL — ABNORMAL LOW (ref 4.22–5.81)
RDW: 11.4 % — ABNORMAL LOW (ref 11.5–15.5)
WBC: 10.2 10*3/uL (ref 4.0–10.5)
nRBC: 0 % (ref 0.0–0.2)

## 2019-06-25 LAB — GLUCOSE, CAPILLARY
Glucose-Capillary: 113 mg/dL — ABNORMAL HIGH (ref 70–99)
Glucose-Capillary: 113 mg/dL — ABNORMAL HIGH (ref 70–99)
Glucose-Capillary: 102 mg/dL — ABNORMAL HIGH (ref 70–99)
Glucose-Capillary: 117 mg/dL — ABNORMAL HIGH (ref 70–99)

## 2019-06-25 LAB — RAPID URINE DRUG SCREEN, HOSP PERFORMED
Amphetamines: NOT DETECTED
Barbiturates: NOT DETECTED
Benzodiazepines: NOT DETECTED
Cocaine: NOT DETECTED
Opiates: POSITIVE — AB
Tetrahydrocannabinol: POSITIVE — AB

## 2019-06-25 LAB — AMYLASE: Amylase: 112 U/L — ABNORMAL HIGH (ref 28–100)

## 2019-06-25 LAB — LIPASE, BLOOD: Lipase: 39 U/L (ref 11–51)

## 2019-06-25 LAB — HEMOGLOBIN A1C
Hgb A1c MFr Bld: 4.6 % — ABNORMAL LOW (ref 4.8–5.6)
Mean Plasma Glucose: 85.32 mg/dL

## 2019-06-25 LAB — PROCALCITONIN: Procalcitonin: <0.1 ng/mL

## 2019-06-25 LAB — CORTISOL: Cortisol, Plasma: 42.8 ug/dL

## 2019-06-25 LAB — PROTIME-INR
INR: 1.1 (ref 0.8–1.2)
INR: 1.1 (ref 0.8–1.2)
Prothrombin Time: 14.3 seconds (ref 11.4–15.2)
Prothrombin Time: 14.5 seconds (ref 11.4–15.2)

## 2019-06-25 LAB — APTT
aPTT: 28 seconds (ref 24–36)
aPTT: 30 seconds (ref 24–36)

## 2019-06-25 LAB — LACTIC ACID, PLASMA
Lactic Acid, Venous: 2.7 mmol/L (ref 0.5–1.9)
Lactic Acid, Venous: 1.8 mmol/L (ref 0.5–1.9)
Lactic Acid, Venous: 1.8 mmol/L (ref 0.5–1.9)

## 2019-06-25 LAB — HIV ANTIBODY (ROUTINE TESTING W REFLEX): HIV Screen 4th Generation wRfx: NONREACTIVE

## 2019-06-25 LAB — STREP PNEUMONIAE URINARY ANTIGEN: Strep Pneumo Urinary Antigen: NEGATIVE

## 2019-06-25 LAB — MAGNESIUM: Magnesium: 1.6 mg/dL — ABNORMAL LOW (ref 1.7–2.4)

## 2019-06-25 MED ORDER — VANCOMYCIN HCL IN DEXTROSE 750-5 MG/150ML-% IV SOLN
750.0000 mg | INTRAVENOUS | Status: DC
  Administered 2019-06-26: 750 mg via INTRAVENOUS
  Filled 2019-06-25: qty 150

## 2019-06-25 MED ORDER — POTASSIUM CHLORIDE 10 MEQ/100ML IV SOLN
10.0000 meq | INTRAVENOUS | Status: AC
  Administered 2019-06-25 (×6): 10 meq via INTRAVENOUS
  Filled 2019-06-25 (×6): qty 100

## 2019-06-25 MED ORDER — ACETAMINOPHEN 10 MG/ML IV SOLN
1000.0000 mg | Freq: Once | INTRAVENOUS | Status: AC
  Administered 2019-06-25: 1000 mg via INTRAVENOUS
  Filled 2019-06-25 (×2): qty 100

## 2019-06-25 MED ORDER — ONDANSETRON HCL 4 MG/2ML IJ SOLN
4.0000 mg | Freq: Four times a day (QID) | INTRAMUSCULAR | Status: DC | PRN
  Administered 2019-06-25 – 2019-06-27 (×4): 4 mg via INTRAVENOUS
  Filled 2019-06-25 (×4): qty 2

## 2019-06-25 MED ORDER — SODIUM CHLORIDE 0.9 % IV BOLUS (SEPSIS)
2000.0000 mL | Freq: Once | INTRAVENOUS | Status: AC
  Administered 2019-06-25: 07:00:00 2000 mL via INTRAVENOUS

## 2019-06-25 MED ORDER — METOCLOPRAMIDE HCL 5 MG/ML IJ SOLN
10.0000 mg | Freq: Once | INTRAMUSCULAR | Status: AC
  Administered 2019-06-25: 10 mg via INTRAVENOUS
  Filled 2019-06-25: qty 2

## 2019-06-25 MED ORDER — SODIUM CHLORIDE 0.9 % IV SOLN
INTRAVENOUS | Status: DC
  Administered 2019-06-25 – 2019-06-27 (×3): via INTRAVENOUS

## 2019-06-25 MED ORDER — ONDANSETRON 4 MG PO TBDP
4.0000 mg | ORAL_TABLET | Freq: Once | ORAL | Status: AC
  Administered 2019-06-25: 4 mg via ORAL
  Filled 2019-06-25: qty 1

## 2019-06-25 MED ORDER — DIPHENHYDRAMINE HCL 50 MG/ML IJ SOLN
25.0000 mg | Freq: Once | INTRAMUSCULAR | Status: AC
  Administered 2019-06-25: 25 mg via INTRAVENOUS
  Filled 2019-06-25: qty 1

## 2019-06-25 MED ORDER — SODIUM CHLORIDE 0.9 % IV SOLN
2.0000 g | Freq: Two times a day (BID) | INTRAVENOUS | Status: DC
  Administered 2019-06-25 – 2019-06-27 (×4): 2 g via INTRAVENOUS
  Filled 2019-06-25 (×4): qty 2

## 2019-06-25 MED ORDER — CLONIDINE HCL 0.3 MG/24HR TD PTWK
0.3000 mg | MEDICATED_PATCH | TRANSDERMAL | Status: DC
  Administered 2019-06-25: 0.3 mg via TRANSDERMAL
  Filled 2019-06-25: qty 1

## 2019-06-25 MED ORDER — PANTOPRAZOLE SODIUM 40 MG IV SOLR
40.0000 mg | Freq: Every day | INTRAVENOUS | Status: DC
  Administered 2019-06-25: 23:00:00 40 mg via INTRAVENOUS
  Filled 2019-06-25: qty 40

## 2019-06-25 MED ORDER — VANCOMYCIN HCL IN DEXTROSE 1-5 GM/200ML-% IV SOLN: 1000.0000 mg | Freq: Once | INTRAVENOUS | Status: DC

## 2019-06-25 MED ORDER — METRONIDAZOLE IN NACL 5-0.79 MG/ML-% IV SOLN
500.0000 mg | Freq: Once | INTRAVENOUS | Status: DC
  Filled 2019-06-25: qty 100

## 2019-06-25 MED ORDER — VANCOMYCIN HCL 10 G IV SOLR
2000.0000 mg | Freq: Once | INTRAVENOUS | Status: AC
  Administered 2019-06-25: 07:00:00 2000 mg via INTRAVENOUS
  Filled 2019-06-25: qty 2000

## 2019-06-25 MED ORDER — HYDRALAZINE HCL 20 MG/ML IJ SOLN
10.0000 mg | INTRAMUSCULAR | Status: DC | PRN
  Administered 2019-06-25: 10:00:00 10 mg via INTRAVENOUS
  Administered 2019-06-25: 18:00:00 20 mg via INTRAVENOUS
  Filled 2019-06-25 (×2): qty 1
  Filled 2019-06-25: qty 2

## 2019-06-25 MED ORDER — SODIUM CHLORIDE 0.9 % IV BOLUS
1000.0000 mL | Freq: Once | INTRAVENOUS | Status: AC
  Administered 2019-06-25: 04:00:00 1000 mL via INTRAVENOUS

## 2019-06-25 MED ORDER — LABETALOL HCL 5 MG/ML IV SOLN
0.5000 mg/min | INTRAVENOUS | Status: DC
  Administered 2019-06-25: 3 mg/min via INTRAVENOUS
  Administered 2019-06-25: 0.5 mg/min via INTRAVENOUS
  Administered 2019-06-26 (×2): 2 mg/min via INTRAVENOUS
  Administered 2019-06-26: 2.8 mg/min via INTRAVENOUS
  Administered 2019-06-26: 05:00:00 3 mg/min via INTRAVENOUS
  Administered 2019-06-26 – 2019-06-27 (×2): 1.8 mg/min via INTRAVENOUS
  Filled 2019-06-25: qty 100
  Filled 2019-06-25: qty 80
  Filled 2019-06-25 (×15): qty 100

## 2019-06-25 MED ORDER — SODIUM CHLORIDE 0.9 % IV SOLN: 250.0000 mL | INTRAVENOUS | Status: DC

## 2019-06-25 MED ORDER — FENTANYL CITRATE (PF) 100 MCG/2ML IJ SOLN
25.0000 ug | INTRAMUSCULAR | Status: DC | PRN
  Administered 2019-06-25: 100 ug via INTRAVENOUS
  Administered 2019-06-26: 25 ug via INTRAVENOUS
  Administered 2019-06-26: 75 ug via INTRAVENOUS
  Administered 2019-06-26: 18:00:00 50 ug via INTRAVENOUS
  Administered 2019-06-27 (×2): 100 ug via INTRAVENOUS
  Filled 2019-06-25 (×7): qty 2

## 2019-06-25 MED ORDER — SODIUM CHLORIDE 0.9 % IV SOLN
2.0000 g | Freq: Once | INTRAVENOUS | Status: AC
  Administered 2019-06-25: 2 g via INTRAVENOUS
  Filled 2019-06-25: qty 2

## 2019-06-25 MED ORDER — CHLORHEXIDINE GLUCONATE CLOTH 2 % EX PADS
6.0000 | MEDICATED_PAD | Freq: Every day | CUTANEOUS | Status: DC
  Administered 2019-06-25 – 2019-06-28 (×3): 6 via TOPICAL

## 2019-06-25 MED ORDER — MAGNESIUM SULFATE 2 GM/50ML IV SOLN
2.0000 g | Freq: Once | INTRAVENOUS | Status: AC
  Administered 2019-06-25: 19:00:00 2 g via INTRAVENOUS
  Filled 2019-06-25: qty 50

## 2019-06-25 NOTE — ED Notes (Signed)
Pt is uncooperative when asked to follow commands or to comply with care. Pt has to be frequently reminded to assist staff with his care. (refuses to lie on back so that a second IV can be obtained, have to ask multiple times for patient to answer questions, etc.).

## 2019-06-25 NOTE — ED Notes (Signed)
Report given to Janett Billow, RN on 60M

## 2019-06-25 NOTE — H&P (Addendum)
PULMONARY / CRITICAL CARE MEDICINE   NAME:  [name], MRN:  [id number], DOB:  07/25/1971, LOS: 0 ADMISSION DATE:  06/24/2019,  REFERRING MD: Emergency department physician, CHIEF COMPLAINT: Hypertension headache nausea vomiting  BRIEF HISTORY:    48 year old with a history of malignant hypertension, chronic kidney disease, who presents with nausea vomiting along with migraine headaches transferred from [facility] to come for further evaluation and treatment HISTORY OF PRESENT ILLNESS   48 year old male with no malignant hypertension he presented and upon hospital 24 hours nausea vomiting and inability to take p.o. medication.  He was transferred to [facility] 06/25/2019 for MRI evaluation which was essentially negative except for bulging with anterior communicating artery which needs to be evaluated in 1 year approximately.  Noted to have an elevated lactic acid of 2.7 and mildly febrile therefore was started on sepsis protocol bolus with 2 L of fluid given cefepime, vancomycin, and Flagyl x1.  Procalcitonin has been ordered and is in process at the time of this dictation.  He will be started on transdermal and IV antihypertensives and continue the Cleviprex until systolic blood pressure is better controlled.  Once his nausea vomiting is resolved he can be restarted on p.o. antihypertensives.  We will admit him to the intensive care unit and hopefully within 24 hours to be stabilized and transition out of the intensive care unit. SIGNIFICANT PAST MEDICAL HISTORY   Malignant hypertension Chronic migraine headaches Chronic kidney disease History of tobacco abuse  SIGNIFICANT EVENTS:  06/24/2019 transferred from [facility] for MRI STUDIES:   06/25/2019 MRI brain which revealed bulging anterior communicating artery without acute process. CULTURES:  06/25/2019 blood cultures x2>>  ANTIBIOTICS:  06/25/2019 vancomycin>> 06/25/2019 cefepime>> 06/25/2019  Flagyl>>  LINES/TUBES:    CONSULTANTS:   SUBJECTIVE:  48 year old male with refractory hypertension questionable sepsis migraine headaches.  Will be admitted started on IV and transdermal antihypertensives wean Cleviprex we will check procalcitonin to rule out sepsis.  CONSTITUTIONAL: BP (!) 184/95   Pulse (!) 114   Temp (!) 100.7 F (38.2 C) (Oral)   Resp (!) 21   Ht 5\' 9"  (1.753 m)   Wt 99.8 kg   SpO2 97%   BMI 32.49 kg/m   I/O last 3 completed shifts: In: 1000 [IV Piggyback:1000] Out: 1500 [Urine:1500]        PHYSICAL EXAM: General: Well-nourished well-developed African-American male in no acute distress Neuro: Follows commands moves all extremities complains of headache. HEENT: Pupils equal react to light noted to have a small amount of photophobia Cardiovascular: Heart sounds are regular regular rate and rhythm without murmur Lungs: Diminished in the bases Abdomen: Soft mildly tender positive bowel sounds Musculoskeletal: Grossly intact Skin: Warm and dry  RESOLVED PROBLEM LIST   ASSESSMENT AND PLAN   Refractory hypertension in the setting of nausea vomiting inability to take p.o. medications for malignant hypertension. Cleviprex drip to keep systolic blood pressure less than 180 IV Apresoline and transdermal Catapres and attempt to wean off Cleviprex drip Treatment for migraine headaches including IV fentanyl Hopefully we can transition off Cleviprex within 24 hours. Admit to the intensive care unit Zofran IV N.p.o.  Migraine headaches with MRI showing 2.2 mm bulbous area and anterior commuting artery regions updated to be followed up with 1 year with MRA Pain control with fentanyl Controlled large arterial blood pressure Treat nausea and vomiting  Presumed sepsis in the setting of elevated lactic acid and febrile state. Check procalcitonin for  completeness Noted to have received cefepime, Flagyl, vancomycin Panculture Monitor lactic acid Low  threshold to stopping antibiotics  Chronic kidney disease Lab Results  Component Value Date   CREATININE 2.83 (H) 06/24/2019   CREATININE 3.15 (H) 01/19/2019   CREATININE 2.83 (H) 11/08/2018   CREATININE 2.82 (H) 10/02/2018   CREATININE 2.66 (H) 11/10/2017   CREATININE 2.27 (H) 12/28/2016   Recent Labs  Lab 06/24/19 1808  K 3.4*   Fluid hydration Monitor creatinine Replete electrolytes as needed  Elevated glucose CBG (last 3)  No results for input(s): GLUCAP in the last 72 hours.  Check hemoglobin A1c Sliding-scale insulin as needed   SUMMARY OF TODAY'S PLAN:  48 year old male with known history of malignant hypertension ,chronic kidney disease who presented to [facility] 06/24/2019 with nausea vomiting and headache.  Found to be hypertensive and required Cleviprex drip and was transferred to [facility] further evaluation via MRI.  Noted to have an elevated lactic acid febrile although white count was normal and was treated with sepsis protocol along with cefepime, Flagyl and vancomycin and 2 L bolus of normal saline.  Due to his refractory hypertension pulmonary critical care is been asked to admit.  Best Practice / Goals of Care / Disposition.   DVT PROPHYLAXIS: SCD SUP:PPI NUTRITION:NPO MOBILITY:bedrest GOALS OF CARE:Full code FAMILY DISCUSSIONS: Pt updated at bedside DISPOSITION ICU admit  LABS  Glucose No results for input(s): GLUCAP in the last 168 hours.  BMET Recent Labs  Lab 06/24/19 1808  NA 143  K 3.4*  CL 108  CO2 23  BUN 24*  CREATININE 2.83*  GLUCOSE 121*    Liver Enzymes Recent Labs  Lab 06/24/19 1808  AST 25  ALT 14  ALKPHOS 59  BILITOT 1.1  ALBUMIN 4.7    Electrolytes Recent Labs  Lab 06/24/19 1808  CALCIUM 8.9    CBC Recent Labs  Lab 06/24/19 1808 06/25/19 0556  WBC 6.8 9.1  HGB 11.8* 14.1  HCT 35.4* 39.9  PLT 257 293    ABG No results for input(s): PHART, PCO2ART, PO2ART in the last 168  hours.  Coag's Recent Labs  Lab 06/25/19 0556  APTT 28  INR 1.1    Sepsis Markers Recent Labs  Lab 06/24/19 1809 06/25/19 0437 06/25/19 0556  LATICACIDVEN 2.0* 2.7* 1.8    Cardiac Enzymes No results for input(s): TROPONINI, PROBNP in the last 168 hours.  PAST MEDICAL HISTORY :   He  has a past medical history of Chronic back pain, Duodenitis with bleeding (08/2008), Essential hypertension, benign, GERD (gastroesophageal reflux disease), Headache(784.0), Hyperlipidemia, Migraines, Obesity, unspecified, and Sleep apnea.  PAST SURGICAL HISTORY:  He  has a past surgical history that includes Foot fracture surgery (Right); Finger debridement (Left, 09/25/2013); Amputation (Left, 09/26/2013); and Cholecystectomy (N/A, 10/03/2018).  Allergies  Allergen Reactions  . Bee Venom Anaphylaxis  . Prochlorperazine Edisylate Other (See Comments)    Reaction: Jittery,uncomfortable feeling  . Cyclobenzaprine Hcl Other (See Comments)    REACTION: feel anxious, nausea  . Aleve [Naproxen Sodium] Nausea And Vomiting    States that this medication also causes abdominal pain  . Carbamazepine     REACTION: unknown reaction  . Geodon [Ziprasidone Hcl] Nausea And Vomiting  . Ibuprofen Nausea And Vomiting    States that this medication also causes abdominal pain  . Maxzide [Hydrochlorothiazide W-Triamterene]     headache  . Nsaids Other (See Comments)    H/o severe anemia from UGIB from ulcer  . Tylenol [  Acetaminophen] Nausea And Vomiting    Patient states that this medication also causes abdominal pain  . Other Nausea And Vomiting, Rash and Other (See Comments)    ALL MUSCLE RELAXANTS: states that they affect sciatic nerve, may cause nausea and vomiting, rash    No current facility-administered medications on file prior to encounter.    Current Outpatient Medications on File Prior to Encounter  Medication Sig  . amLODipine (NORVASC) 10 MG tablet Take 1 tablet (10 mg total) by mouth daily.   . cloNIDine (CATAPRES) 0.3 MG tablet Take 1 tablet (0.3 mg total) by mouth 3 (three) times daily.  Marland Kitchen esomeprazole (NEXIUM) 20 MG capsule Take 20 mg by mouth daily at 12 noon.  . hydrALAZINE (APRESOLINE) 100 MG tablet Take 1 tablet (100 mg total) by mouth 3 (three) times daily.  . hydrOXYzine (VISTARIL) 50 MG capsule Take 50 mg by mouth 2 (two) times daily as needed for anxiety or itching.   . ondansetron (ZOFRAN) 8 MG tablet Take 1 tablet (8 mg total) by mouth every 4 (four) hours as needed. (Patient taking differently: Take 8 mg by mouth every 4 (four) hours as needed for nausea or vomiting. )    FAMILY HISTORY:   His family history includes Arthritis in his father; Diabetes in his father and mother; Hypertension in his father and mother.  SOCIAL HISTORY:  He  reports that he quit smoking about 6 years ago. His smoking use included cigarettes. He has a 1.50 pack-year smoking history. He has never used smokeless tobacco. He reports that he does not drink alcohol or use drugs.  REVIEW OF SYSTEMS:    10 point review of system taken, please see HPI for positives and negatives.    App cct 60 min   Richardson Landry Minor ACNP Maryanna Shape PCCM Pager [phone] till 1 pm If no answer page [phone] 06/25/2019, 7:48 AM  Attending Note:  48 year old male with malignant hypertension presenting to the ER with headache, N/V and malignant HTN.  Patient was started on cleviprex and PCCM was asked to admit.  Continues to complain of N/V.  On exam, lungs are clear.  I reviewed CXR myself, no acute disease noted.  On lab review Cr is elevated consistent with his CKD.  Will admit to the ICU.  Will continue cleviprex.  When able to take PO will start PO anti-HTN and wean drip off.  Treat N/V.  IVF ordered while unable to take PO.  PCCM will continue to manage.  The patient is critically ill with multiple organ systems failure and requires high complexity decision making for assessment and support, frequent  evaluation and titration of therapies, application of advanced monitoring technologies and extensive interpretation of multiple databases.   Critical Care Time devoted to patient care services described in this note is  36  Minutes. This time reflects time of care of this signee Dr Jennet Maduro. This critical care time does not reflect procedure time, or teaching time or supervisory time of PA/NP/Med student/Med Resident etc but could involve care discussion time.  Rush Farmer, M.D. [facility] Pulmonary/Critical Care Medicine. Pager: [phone]. After hours pager: [phone].

## 2019-06-25 NOTE — ED Notes (Signed)
Pt refusing to get blood work done.

## 2019-06-25 NOTE — ED Notes (Signed)
Pt is awaiting MRI.

## 2019-06-25 NOTE — ED Notes (Signed)
Pt keeps removing monitoring equipment.

## 2019-06-25 NOTE — ED Notes (Signed)
Pt removed all monitoring equipment again.

## 2019-06-25 NOTE — ED Notes (Signed)
Attempts made to start IV when pt arrived which were unsuccessful. Now pt requires multiple medications so IV team consult put in.

## 2019-06-25 NOTE — ED Provider Notes (Signed)
Patient received in transfer from [facility] where he presented with elevated blood pressure, bifrontal headache, vomiting.  CT of head was unremarkable, he was placed on clevidipine drip and transferred here for MRI.  He is noted to be febrile on arrival here, also noted to have had a mildly elevated lactic acid level at any pen.  WBC was normal.  Will repeat lactic acid and check urinalysis and chest x-ray in addition to MRI scan.  Headache is felt to be consistent with migraine variant and he is noted to have had ED visits for headaches as well as blood pressure issues.  We will give migraine cocktail of normal saline, metoclopramide, diphenhydramine.  Unfortunately, he has intolerance to NSAIDs and acetaminophen, so unable to give anything for fever control.  MRI scan showed no acute process, although 2 mm anterior bulge and anterior communicating artery is something that will require one-year follow-up.  Headache is much improved with above-noted treatment.  Blood pressure initially came down, but has gone back up.  Repeat lactic acid level has increased to 2.7.  He is started on sepsis protocol and is given remainder of early goal-directed fluid bolus and started on antibiotics for sepsis of undetermined origin.  Case is discussed with Dr. Oletta Darter of critical care who agrees to admit the patient.  CRITICAL CARE Performed by: Delora Fuel Total critical care time: 45 minutes Critical care time was exclusive of separately billable procedures and treating other patients. Critical care was necessary to treat or prevent imminent or life-threatening deterioration. Critical care was time spent personally by me on the following activities: development of treatment plan with patient and/or surrogate as well as nursing, discussions with consultants, evaluation of patient's response to treatment, examination of patient, obtaining history from patient or surrogate, ordering and performing treatments and  interventions, ordering and review of laboratory studies, ordering and review of radiographic studies, pulse oximetry and re-evaluation of patient's condition.  Please note that this is separate and distinct from the critical care performed by Dr. Sedonia Small at [facility].  This is critical care related to sepsis.  Results for orders placed or performed during the hospital encounter of 06/24/19  SARS Coronavirus 2 [facility] order, Performed in [facility] hospital lab) Nasopharyngeal Nasopharyngeal Swab   Specimen: Nasopharyngeal Swab  Result Value Ref Range   SARS Coronavirus 2 NEGATIVE NEGATIVE  CBC  Result Value Ref Range   WBC 6.8 4.0 - 10.5 K/uL   RBC 3.68 (L) 4.22 - 5.81 MIL/uL   Hemoglobin 11.8 (L) 13.0 - 17.0 g/dL   HCT 35.4 (L) 39.0 - 52.0 %   MCV 96.2 80.0 - 100.0 fL   MCH 32.1 26.0 - 34.0 pg   MCHC 33.3 30.0 - 36.0 g/dL   RDW 10.9 (L) 11.5 - 15.5 %   Platelets 257 150 - 400 K/uL   nRBC 0.0 0.0 - 0.2 %  Comprehensive metabolic panel  Result Value Ref Range   Sodium 143 135 - 145 mmol/L   Potassium 3.4 (L) 3.5 - 5.1 mmol/L   Chloride 108 98 - 111 mmol/L   CO2 23 22 - 32 mmol/L   Glucose, Bld 121 (H) 70 - 99 mg/dL   BUN 24 (H) 6 - 20 mg/dL   Creatinine, Ser 2.83 (H) 0.61 - 1.24 mg/dL   Calcium 8.9 8.9 - 10.3 mg/dL   Total Protein 7.4 6.5 - 8.1 g/dL   Albumin 4.7 3.5 - 5.0 g/dL   AST 25 15 - 41  U/L   ALT 14 0 - 44 U/L   Alkaline Phosphatase 59 38 - 126 U/L   Total Bilirubin 1.1 0.3 - 1.2 mg/dL   GFR calc non Af Amer 25 (L) >60 mL/min   GFR calc Af Amer 29 (L) >60 mL/min   Anion gap 12 5 - 15  Lactic acid, plasma  Result Value Ref Range   Lactic Acid, Venous 2.0 (HH) 0.5 - 1.9 mmol/L  Lactic acid, plasma  Result Value Ref Range   Lactic Acid, Venous 2.7 (HH) 0.5 - 1.9 mmol/L  Urinalysis, Routine w reflex microscopic  Result Value Ref Range   Color, Urine STRAW (A) YELLOW   APPearance CLEAR CLEAR   Specific Gravity, Urine 1.008 1.005 - 1.030   pH 8.0 5.0 -  8.0   Glucose, UA NEGATIVE NEGATIVE mg/dL   Hgb urine dipstick SMALL (A) NEGATIVE   Bilirubin Urine NEGATIVE NEGATIVE   Ketones, ur 5 (A) NEGATIVE mg/dL   Protein, ur 100 (A) NEGATIVE mg/dL   Nitrite NEGATIVE NEGATIVE   Leukocytes,Ua NEGATIVE NEGATIVE   RBC / HPF 0-5 0 - 5 RBC/hpf   WBC, UA 0-5 0 - 5 WBC/hpf   Bacteria, UA NONE SEEN NONE SEEN  Lactic acid, plasma  Result Value Ref Range   Lactic Acid, Venous 1.8 0.5 - 1.9 mmol/L  CBC WITH DIFFERENTIAL  Result Value Ref Range   WBC 9.1 4.0 - 10.5 K/uL   RBC 4.22 4.22 - 5.81 MIL/uL   Hemoglobin 14.1 13.0 - 17.0 g/dL   HCT 39.9 39.0 - 52.0 %   MCV 94.5 80.0 - 100.0 fL   MCH 33.4 26.0 - 34.0 pg   MCHC 35.3 30.0 - 36.0 g/dL   RDW 11.3 (L) 11.5 - 15.5 %   Platelets 293 150 - 400 K/uL   nRBC 0.0 0.0 - 0.2 %   Neutrophils Relative % 91 %   Neutro Abs 8.3 (H) 1.7 - 7.7 K/uL   Lymphocytes Relative 5 %   Lymphs Abs 0.5 (L) 0.7 - 4.0 K/uL   Monocytes Relative 3 %   Monocytes Absolute 0.3 0.1 - 1.0 K/uL   Eosinophils Relative 0 %   Eosinophils Absolute 0.0 0.0 - 0.5 K/uL   Basophils Relative 0 %   Basophils Absolute 0.0 0.0 - 0.1 K/uL   Immature Granulocytes 1 %   Abs Immature Granulocytes 0.05 0.00 - 0.07 K/uL  Troponin I (High Sensitivity)  Result Value Ref Range   Troponin I (High Sensitivity) 19 (H) <18 ng/L  Troponin I (High Sensitivity)  Result Value Ref Range   Troponin I (High Sensitivity) 17 <18 ng/L   Ct Head Wo Contrast  Result Date: 06/24/2019 CLINICAL DATA:  Altered level of consciousness, unexplained. EXAM: CT HEAD WITHOUT CONTRAST TECHNIQUE: Contiguous axial images were obtained from the base of the skull through the vertex without intravenous contrast. COMPARISON:  Head CT dated 01/19/2019 FINDINGS: Brain: No hydrocephalus. No mass, hemorrhage, edema or other evidence of acute parenchymal abnormality. No extra-axial hemorrhage. Vascular: No hyperdense vessel or unexpected calcification. Skull: Normal. Negative for  fracture or focal lesion. Sinuses/Orbits: No acute finding. Other: None. IMPRESSION: Negative head CT. No intracranial mass, hemorrhage or edema. Electronically Signed   By: Franki Cabot M.D.   On: 06/24/2019 19:52   Mr Angio Head Wo Contrast  Result Date: 06/25/2019 CLINICAL DATA:  Malignant hypertension with altered mental status EXAM: MRI HEAD WITHOUT CONTRAST MRA HEAD WITHOUT CONTRAST MRA NECK WITHOUT CONTRAST TECHNIQUE: Multiplanar, multiecho pulse  sequences of the brain and surrounding structures were obtained without intravenous contrast. Angiographic images of the Circle of Willis were obtained using MRA technique without intravenous contrast. Angiographic images of the neck were obtained using MRA technique without intravenous contrast. Carotid stenosis measurements (when applicable) are obtained utilizing NASCET criteria, using the distal internal carotid diameter as the denominator. COMPARISON:  Head CT earlier today.  Brain MRI 08/09/2010 FINDINGS: MRI HEAD FINDINGS Brain: No acute infarction, hemorrhage, hydrocephalus, extra-axial collection or mass lesion. Very mild patchy FLAIR hyperintensity in the cerebral white matter from remote nonspecific insult without significant change since 2011. History suggests microvascular ischemia in the setting of hypertension. There is also history of migraines. Vascular: Arterial findings below.  Normal flow voids. Skull and upper cervical spine: Negative for marrow lesion. 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 MRA HEAD FINDINGS Generalized mild motion artifact. Right dominant vertebral artery with only a tiny left vertebral continue into the basilar. Vertebral, basilar, and carotid vessels are smooth and diffusely patent. Apparent atheromatous irregularity of medium size MCA branches may be from artifact. 2 mm anterior bulge in the anterior communicating artery region. MRA NECK FINDINGS Non covered great vessel  origins, which is a typical limitation by MRA. The right vertebral artery is dominant. Vessels are smooth and widely patent. No evidence of atheromatous disease. IMPRESSION: Brain MRI: No emergent finding or change from 2011. Intracranial MRA: 1. No emergent finding or flow limiting stenosis. There is limitation due to motion artifact. 2. 2 mm bulbous area in the anterior communicating artery region, consider 1 year follow-up MRA. Neck MRA: Negative. Electronically Signed   By: Monte Fantasia M.D.   On: 06/25/2019 04:37   Mr Angio Neck Wo Contrast  Result Date: 06/25/2019 CLINICAL DATA:  Malignant hypertension with altered mental status EXAM: MRI HEAD WITHOUT CONTRAST MRA HEAD WITHOUT CONTRAST MRA NECK WITHOUT CONTRAST TECHNIQUE: Multiplanar, multiecho pulse sequences of the brain and surrounding structures were obtained without intravenous contrast. Angiographic images of the Circle of Willis were obtained using MRA technique without intravenous contrast. Angiographic images of the neck were obtained using MRA technique without intravenous contrast. Carotid stenosis measurements (when applicable) are obtained utilizing NASCET criteria, using the distal internal carotid diameter as the denominator. COMPARISON:  Head CT earlier today.  Brain MRI 08/09/2010 FINDINGS: MRI HEAD FINDINGS Brain: No acute infarction, hemorrhage, hydrocephalus, extra-axial collection or mass lesion. Very mild patchy FLAIR hyperintensity in the cerebral white matter from remote nonspecific insult without significant change since 2011. History suggests microvascular ischemia in the setting of hypertension. There is also history of migraines. Vascular: Arterial findings below.  Normal flow voids. Skull and upper cervical spine: Negative for marrow lesion. 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 MRA HEAD FINDINGS Generalized mild motion artifact. Right dominant vertebral artery with only a tiny  left vertebral continue into the basilar. Vertebral, basilar, and carotid vessels are smooth and diffusely patent. Apparent atheromatous irregularity of medium size MCA branches may be from artifact. 2 mm anterior bulge in the anterior communicating artery region. MRA NECK FINDINGS Non covered great vessel origins, which is a typical limitation by MRA. The right vertebral artery is dominant. Vessels are smooth and widely patent. No evidence of atheromatous disease. IMPRESSION: Brain MRI: No emergent finding or change from 2011. Intracranial MRA: 1. No emergent finding or flow limiting stenosis. There is limitation due to motion artifact. 2. 2 mm bulbous area in the anterior communicating artery region, consider 1 year  follow-up MRA. Neck MRA: Negative. Electronically Signed   By: Monte Fantasia M.D.   On: 06/25/2019 04:37   Mr Brain Wo Contrast  Result Date: 06/25/2019 CLINICAL DATA:  Malignant hypertension with altered mental status EXAM: MRI HEAD WITHOUT CONTRAST MRA HEAD WITHOUT CONTRAST MRA NECK WITHOUT CONTRAST TECHNIQUE: Multiplanar, multiecho pulse sequences of the brain and surrounding structures were obtained without intravenous contrast. Angiographic images of the Circle of Willis were obtained using MRA technique without intravenous contrast. Angiographic images of the neck were obtained using MRA technique without intravenous contrast. Carotid stenosis measurements (when applicable) are obtained utilizing NASCET criteria, using the distal internal carotid diameter as the denominator. COMPARISON:  Head CT earlier today.  Brain MRI 08/09/2010 FINDINGS: MRI HEAD FINDINGS Brain: No acute infarction, hemorrhage, hydrocephalus, extra-axial collection or mass lesion. Very mild patchy FLAIR hyperintensity in the cerebral white matter from remote nonspecific insult without significant change since 2011. History suggests microvascular ischemia in the setting of hypertension. There is also history of  migraines. Vascular: Arterial findings below.  Normal flow voids. Skull and upper cervical spine: Negative for marrow lesion. 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 MRA HEAD FINDINGS Generalized mild motion artifact. Right dominant vertebral artery with only a tiny left vertebral continue into the basilar. Vertebral, basilar, and carotid vessels are smooth and diffusely patent. Apparent atheromatous irregularity of medium size MCA branches may be from artifact. 2 mm anterior bulge in the anterior communicating artery region. MRA NECK FINDINGS Non covered great vessel origins, which is a typical limitation by MRA. The right vertebral artery is dominant. Vessels are smooth and widely patent. No evidence of atheromatous disease. IMPRESSION: Brain MRI: No emergent finding or change from 2011. Intracranial MRA: 1. No emergent finding or flow limiting stenosis. There is limitation due to motion artifact. 2. 2 mm bulbous area in the anterior communicating artery region, consider 1 year follow-up MRA. Neck MRA: Negative. Electronically Signed   By: Monte Fantasia M.D.   On: 06/25/2019 04:37   Dg Chest Port 1 View  Result Date: 06/24/2019 CLINICAL DATA:  Headaches with vomiting since this morning. EXAM: PORTABLE CHEST 1 VIEW COMPARISON:  Radiographs 01/19/2019 and 08/31/2016. FINDINGS: 1812 hours. The left costophrenic angle is partially excluded. The heart size and mediastinal contours are normal. The lungs are clear. There is no pleural effusion or pneumothorax. No acute osseous findings are identified. Telemetry leads overlie the chest. IMPRESSION: No evidence of active cardiopulmonary process. Electronically Signed   By: Richardean Sale M.D.   On: 06/24/2019 19:00   Images viewed by me.    Delora Fuel, MD [id number] [phone]

## 2019-06-25 NOTE — ED Notes (Signed)
Pt is actively vomiting

## 2019-06-25 NOTE — ED Notes (Signed)
Patient transported to MRI 

## 2019-06-25 NOTE — ED Notes (Signed)
ED TO INPATIENT HANDOFF REPORT  ED Nurse Name and Phone #: Lorrin Goodell [phone]  S Name/Age/Gender [name] 48 y.o. male Room/Bed: 022C/022C  Code Status   Code Status: Full Code  Home/SNF/Other Home Patient oriented to: self, place, time and situation Is this baseline? Yes   Triage Complete: Triage complete  Chief Complaint emesis  Triage Note Pt presents to ED with vomiting since this am. Pt also c/o headache started this am 1000. Pt noted to have AMS in triage and unable to fully answer questions without having to repeat more than once.    Allergies Allergies  Allergen Reactions  . Bee Venom Anaphylaxis  . Prochlorperazine Edisylate Other (See Comments)    Reaction: Jittery,uncomfortable feeling  . Cyclobenzaprine Hcl Other (See Comments)    REACTION: feel anxious, nausea  . Aleve [Naproxen Sodium] Nausea And Vomiting    States that this medication also causes abdominal pain  . Carbamazepine     REACTION: unknown reaction  . Geodon [Ziprasidone Hcl] Nausea And Vomiting  . Ibuprofen Nausea And Vomiting    States that this medication also causes abdominal pain  . Maxzide [Hydrochlorothiazide W-Triamterene]     headache  . Nsaids Other (See Comments)    H/o severe anemia from UGIB from ulcer  . Tylenol [Acetaminophen] Nausea And Vomiting    Patient states that this medication also causes abdominal pain  . Other Nausea And Vomiting, Rash and Other (See Comments)    ALL MUSCLE RELAXANTS: states that they affect sciatic nerve, may cause nausea and vomiting, rash    Level of Care/Admitting Diagnosis ED Disposition    ED Disposition Condition [facility] Area: [place] [[id number]]  Level of Care: ICU [6]  Covid Evaluation: Confirmed COVID Negative  Diagnosis: Hypertension [id number]  Admitting Physician: Lady Saucier  Attending Physician: PCCM, MD [phone]  Estimated length of stay: past midnight tomorrow  Certification::  I certify this patient will need inpatient services for at least 2 midnights  PT Class (Do Not Modify): Inpatient [101]  PT Acc Code (Do Not Modify): Private [1]       B Medical/Surgery History Past Medical History:  Diagnosis Date  . Chronic back pain   . Duodenitis with bleeding 08/2008   Ulcer - admitted to [facility]   . Essential hypertension, benign   . GERD (gastroesophageal reflux disease)   . Headache(784.0)   . Hyperlipidemia   . Migraines   . Obesity, unspecified   . Sleep apnea    Past Surgical History:  Procedure Laterality Date  . 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 CHOLECYSTECTOMY N/A 10/03/2018   Procedure: LAPAROSCOPIC CHOLECYSTECTOMY;  Surgeon: Aviva Signs, MD;  Location: [facility];  Service: General;  Laterality: N/A;  . FINGER DEBRIDEMENT Left 09/25/2013   Thumb  . FOOT FRACTURE SURGERY Right      A IV Location/Drains/Wounds Patient Lines/Drains/Airways Status   Active Line/Drains/Airways    Name:   Placement date:   Placement time:   Site:   Days:   Peripheral IV 06/24/19 Right Hand   06/24/19    1735    Hand   1   Peripheral IV 06/25/19 Anterior;Left;Upper Arm   06/25/19    0644    Arm   less than 1   Incision (  Closed) 10/03/18 Abdomen   10/03/18    0814     265   Incision - 4 Ports Abdomen Umbilicus Mid;Upper Right;Upper Right;Lower   10/03/18    0758     265          Intake/Output Last 24 hours  Intake/Output Summary (Last 24 hours) at 06/25/2019 0802 Last data filed at 06/25/2019 [id number] Gross per 24 hour  Intake 1100 ml  Output 1500 ml  Net -400 ml    Labs/Imaging Results for orders placed or performed during the hospital encounter of 06/24/19 (from the past 48 hour(s))  CBC     Status: Abnormal   Collection Time: 06/24/19  6:08 PM  Result Value Ref Range   WBC  6.8 4.0 - 10.5 K/uL   RBC 3.68 (L) 4.22 - 5.81 MIL/uL   Hemoglobin 11.8 (L) 13.0 - 17.0 g/dL   HCT 35.4 (L) 39.0 - 52.0 %   MCV 96.2 80.0 - 100.0 fL   MCH 32.1 26.0 - 34.0 pg   MCHC 33.3 30.0 - 36.0 g/dL   RDW 10.9 (L) 11.5 - 15.5 %   Platelets 257 150 - 400 K/uL   nRBC 0.0 0.0 - 0.2 %    Comment: Performed at [facility], [address] [place], [place] [address]  Comprehensive metabolic panel     Status: Abnormal   Collection Time: 06/24/19  6:08 PM  Result Value Ref Range   Sodium 143 135 - 145 mmol/L   Potassium 3.4 (L) 3.5 - 5.1 mmol/L   Chloride 108 98 - 111 mmol/L   CO2 23 22 - 32 mmol/L   Glucose, Bld 121 (H) 70 - 99 mg/dL   BUN 24 (H) 6 - 20 mg/dL   Creatinine, Ser 2.83 (H) 0.61 - 1.24 mg/dL   Calcium 8.9 8.9 - 10.3 mg/dL   Total Protein 7.4 6.5 - 8.1 g/dL   Albumin 4.7 3.5 - 5.0 g/dL   AST 25 15 - 41 U/L   ALT 14 0 - 44 U/L   Alkaline Phosphatase 59 38 - 126 U/L   Total Bilirubin 1.1 0.3 - 1.2 mg/dL   GFR calc non Af Amer 25 (L) >60 mL/min   GFR calc Af Amer 29 (L) >60 mL/min   Anion gap 12 5 - 15    Comment: Performed at [facility], [address] [place], [place] [address]  Troponin I (High Sensitivity)     Status: Abnormal   Collection Time: 06/24/19  6:08 PM  Result Value Ref Range   Troponin I (High Sensitivity) 19 (H) <18 ng/L    Comment: (NOTE) Elevated high sensitivity troponin I (hsTnI) values and significant  changes across serial measurements may suggest ACS but many other  chronic and acute conditions are known to elevate hsTnI results.  Refer to the "Links" section for chest pain algorithms and additional  guidance. Performed at [facility], [address] [place], [place] [address]   Lactic acid, plasma     Status: Abnormal   Collection Time: 06/24/19  6:09 PM  Result Value Ref Range   Lactic Acid, Venous 2.0 (HH) 0.5 - 1.9 mmol/L    Comment: CRITICAL RESULT CALLED TO, READ BACK BY AND VERIFIED WITH: DOSS,M ON 06/24/19 AT 1910 BY  LOY,C Performed at [facility], [address] [place],  [address]   SARS Coronavirus 2 [facility] order, Performed in [facility] hospital lab) Nasopharyngeal Nasopharyngeal Swab     Status: None   Collection  Time: 06/24/19  7:18 PM   Specimen: Nasopharyngeal Swab  Result Value Ref Range   SARS Coronavirus 2 NEGATIVE NEGATIVE    Comment: (NOTE) If result is NEGATIVE SARS-CoV-2 target nucleic acids are NOT DETECTED. The SARS-CoV-2 RNA is generally detectable in upper and lower  respiratory specimens during the acute phase of infection. The lowest  concentration of SARS-CoV-2 viral copies this assay can detect is 250  copies / mL. A negative result does not preclude SARS-CoV-2 infection  and should not be used as the sole basis for treatment or other  patient management decisions.  A negative result may occur with  improper specimen collection / handling, submission of specimen other  than nasopharyngeal swab, presence of viral mutation(s) within the  areas targeted by this assay, and inadequate number of viral copies  (<250 copies / mL). A negative result must be combined with clinical  observations, patient history, and epidemiological information. If result is POSITIVE SARS-CoV-2 target nucleic acids are DETECTED. The SARS-CoV-2 RNA is generally detectable in upper and lower  respiratory specimens dur ing the acute phase of infection.  Positive  results are indicative of active infection with SARS-CoV-2.  Clinical  correlation with patient history and other diagnostic information is  necessary to determine patient infection status.  Positive results do  not rule out bacterial infection or co-infection with other viruses. If result is PRESUMPTIVE POSTIVE SARS-CoV-2 nucleic acids MAY BE PRESENT.   A presumptive positive result was obtained on the submitted specimen  and confirmed on repeat testing.  While 2019 novel coronavirus  (SARS-CoV-2) nucleic acids may be present in  the submitted sample  additional confirmatory testing may be necessary for epidemiological  and / or clinical management purposes  to differentiate between  SARS-CoV-2 and other Sarbecovirus currently known to infect humans.  If clinically indicated additional testing with an alternate test  methodology [phone]) is advised. The SARS-CoV-2 RNA is generally  detectable in upper and lower respiratory sp ecimens during the acute  phase of infection. The expected result is Negative. Fact Sheet for Patients:  StrictlyIdeas.no Fact Sheet for Healthcare Providers: BankingDealers.co.za This test is not yet approved or cleared by the [place] FDA and has been authorized for detection and/or diagnosis of SARS-CoV-2 by FDA under an Emergency Use Authorization (EUA).  This EUA will remain in effect (meaning this test can be used) for the duration of the COVID-19 declaration under Section 564(b)(1) of the Act, 21 U.S.C. section 360bbb-3(b)(1), unless the authorization is terminated or revoked sooner. Performed at [facility], [address], [place], [place] [address]   Troponin I (High Sensitivity)     Status: None   Collection Time: 06/24/19  7:52 PM  Result Value Ref Range   Troponin I (High Sensitivity) 17 <18 ng/L    Comment: (NOTE) Elevated high sensitivity troponin I (hsTnI) values and significant  changes across serial measurements may suggest ACS but many other  chronic and acute conditions are known to elevate hsTnI results.  Refer to the "Links" section for chest pain algorithms and additional  guidance. Performed at [facility], [address] [place], [place] [address]   Urinalysis, Routine w reflex microscopic     Status: Abnormal   Collection Time: 06/25/19  2:47 AM  Result Value Ref Range   Color, Urine STRAW (A) YELLOW   APPearance CLEAR CLEAR   Specific Gravity, Urine 1.008 1.005 - 1.030   pH 8.0 5.0 - 8.0   Glucose, UA  NEGATIVE NEGATIVE  mg/dL   Hgb urine dipstick SMALL (A) NEGATIVE   Bilirubin Urine NEGATIVE NEGATIVE   Ketones, ur 5 (A) NEGATIVE mg/dL   Protein, ur 100 (A) NEGATIVE mg/dL   Nitrite NEGATIVE NEGATIVE   Leukocytes,Ua NEGATIVE NEGATIVE   RBC / HPF 0-5 0 - 5 RBC/hpf   WBC, UA 0-5 0 - 5 WBC/hpf   Bacteria, UA NONE SEEN NONE SEEN    Comment: Performed at [facility] [address], [place], [place] [address]  Lactic acid, plasma     Status: Abnormal   Collection Time: 06/25/19  4:37 AM  Result Value Ref Range   Lactic Acid, Venous 2.7 (HH) 0.5 - 1.9 mmol/L    Comment: CRITICAL RESULT CALLED TO, READ BACK BY AND VERIFIED WITH: Rochele Raring [id number] [id number] Performed at [facility], [place] [address], [place], [place] [address]   Lactic acid, plasma     Status: None   Collection Time: 06/25/19  5:56 AM  Result Value Ref Range   Lactic Acid, Venous 1.8 0.5 - 1.9 mmol/L    Comment: Performed at [facility] [address] [place], [place] [address]  CBC WITH DIFFERENTIAL     Status: Abnormal   Collection Time: 06/25/19  5:56 AM  Result Value Ref Range   WBC 9.1 4.0 - 10.5 K/uL   RBC 4.22 4.22 - 5.81 MIL/uL   Hemoglobin 14.1 13.0 - 17.0 g/dL   HCT 39.9 39.0 - 52.0 %   MCV 94.5 80.0 - 100.0 fL   MCH 33.4 26.0 - 34.0 pg   MCHC 35.3 30.0 - 36.0 g/dL   RDW 11.3 (L) 11.5 - 15.5 %   Platelets 293 150 - 400 K/uL   nRBC 0.0 0.0 - 0.2 %   Neutrophils Relative % 91 %   Neutro Abs 8.3 (H) 1.7 - 7.7 K/uL   Lymphocytes Relative 5 %   Lymphs Abs 0.5 (L) 0.7 - 4.0 K/uL   Monocytes Relative 3 %   Monocytes Absolute 0.3 0.1 - 1.0 K/uL   Eosinophils Relative 0 %   Eosinophils Absolute 0.0 0.0 - 0.5 K/uL   Basophils Relative 0 %   Basophils Absolute 0.0 0.0 - 0.1 K/uL   Immature Granulocytes 1 %   Abs Immature Granulocytes 0.05 0.00 - 0.07 K/uL    Comment: Performed at [facility], [address], [place], [place] [address]  APTT     Status: None   Collection  Time: 06/25/19  5:56 AM  Result Value Ref Range   aPTT 28 24 - 36 seconds    Comment: Performed at [facility] [address], [place], [place] [address]  Protime-INR     Status: None   Collection Time: 06/25/19  5:56 AM  Result Value Ref Range   Prothrombin Time 14.3 11.4 - 15.2 seconds   INR 1.1 0.8 - 1.2    Comment: (NOTE) INR goal varies based on device and disease states. Performed at [facility], [place] [address] [place], [place] [address]    Ct Head Wo Contrast  Result Date: 06/24/2019 CLINICAL DATA:  Altered level of consciousness, unexplained. EXAM: CT HEAD WITHOUT CONTRAST TECHNIQUE: Contiguous axial images were obtained from the base of the skull through the vertex without intravenous contrast. COMPARISON:  Head CT dated 01/19/2019 FINDINGS: Brain: No hydrocephalus. No mass, hemorrhage, edema or other evidence of acute parenchymal abnormality. No extra-axial hemorrhage. Vascular: No hyperdense vessel or unexpected  calcification. Skull: Normal. Negative for fracture or focal lesion. Sinuses/Orbits: No acute finding. Other: None. IMPRESSION: Negative head CT. No intracranial mass, hemorrhage or edema. Electronically Signed   By: Franki Cabot M.D.   On: 06/24/2019 19:52   Mr Angio Head Wo Contrast  Result Date: 06/25/2019 CLINICAL DATA:  Malignant hypertension with altered mental status EXAM: MRI HEAD WITHOUT CONTRAST MRA HEAD WITHOUT CONTRAST MRA NECK WITHOUT CONTRAST TECHNIQUE: Multiplanar, multiecho pulse sequences of the brain and surrounding structures were obtained without intravenous contrast. Angiographic images of the Circle of Willis were obtained using MRA technique without intravenous contrast. Angiographic images of the neck were obtained using MRA technique without intravenous contrast. Carotid stenosis measurements (when applicable) are obtained utilizing NASCET criteria, using the distal internal carotid diameter as the denominator. COMPARISON:  Head CT  earlier today.  Brain MRI 08/09/2010 FINDINGS: MRI HEAD FINDINGS Brain: No acute infarction, hemorrhage, hydrocephalus, extra-axial collection or mass lesion. Very mild patchy FLAIR hyperintensity in the cerebral white matter from remote nonspecific insult without significant change since 2011. History suggests microvascular ischemia in the setting of hypertension. There is also history of migraines. Vascular: Arterial findings below.  Normal flow voids. Skull and upper cervical spine: Negative for marrow lesion. 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 MRA HEAD FINDINGS Generalized mild motion artifact. Right dominant vertebral artery with only a tiny left vertebral continue into the basilar. Vertebral, basilar, and carotid vessels are smooth and diffusely patent. Apparent atheromatous irregularity of medium size MCA branches may be from artifact. 2 mm anterior bulge in the anterior communicating artery region. MRA NECK FINDINGS Non covered great vessel origins, which is a typical limitation by MRA. The right vertebral artery is dominant. Vessels are smooth and widely patent. No evidence of atheromatous disease. IMPRESSION: Brain MRI: No emergent finding or change from 2011. Intracranial MRA: 1. No emergent finding or flow limiting stenosis. There is limitation due to motion artifact. 2. 2 mm bulbous area in the anterior communicating artery region, consider 1 year follow-up MRA. Neck MRA: Negative. Electronically Signed   By: Monte Fantasia M.D.   On: 06/25/2019 04:37   Mr Angio Neck Wo Contrast  Result Date: 06/25/2019 CLINICAL DATA:  Malignant hypertension with altered mental status EXAM: MRI HEAD WITHOUT CONTRAST MRA HEAD WITHOUT CONTRAST MRA NECK WITHOUT CONTRAST TECHNIQUE: Multiplanar, multiecho pulse sequences of the brain and surrounding structures were obtained without intravenous contrast. Angiographic images of the Circle of Willis were obtained using MRA  technique without intravenous contrast. Angiographic images of the neck were obtained using MRA technique without intravenous contrast. Carotid stenosis measurements (when applicable) are obtained utilizing NASCET criteria, using the distal internal carotid diameter as the denominator. COMPARISON:  Head CT earlier today.  Brain MRI 08/09/2010 FINDINGS: MRI HEAD FINDINGS Brain: No acute infarction, hemorrhage, hydrocephalus, extra-axial collection or mass lesion. Very mild patchy FLAIR hyperintensity in the cerebral white matter from remote nonspecific insult without significant change since 2011. History suggests microvascular ischemia in the setting of hypertension. There is also history of migraines. Vascular: Arterial findings below.  Normal flow voids. Skull and upper cervical spine: Negative for marrow lesion. 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 MRA HEAD FINDINGS Generalized mild motion artifact. Right dominant vertebral artery with only a tiny left vertebral continue into the basilar. Vertebral, basilar, and carotid vessels are smooth and diffusely patent. Apparent atheromatous irregularity of medium size MCA branches may be from artifact. 2 mm anterior bulge in the anterior  communicating artery region. MRA NECK FINDINGS Non covered great vessel origins, which is a typical limitation by MRA. The right vertebral artery is dominant. Vessels are smooth and widely patent. No evidence of atheromatous disease. IMPRESSION: Brain MRI: No emergent finding or change from 2011. Intracranial MRA: 1. No emergent finding or flow limiting stenosis. There is limitation due to motion artifact. 2. 2 mm bulbous area in the anterior communicating artery region, consider 1 year follow-up MRA. Neck MRA: Negative. Electronically Signed   By: Monte Fantasia M.D.   On: 06/25/2019 04:37   Mr Brain Wo Contrast  Result Date: 06/25/2019 CLINICAL DATA:  Malignant hypertension with altered  mental status EXAM: MRI HEAD WITHOUT CONTRAST MRA HEAD WITHOUT CONTRAST MRA NECK WITHOUT CONTRAST TECHNIQUE: Multiplanar, multiecho pulse sequences of the brain and surrounding structures were obtained without intravenous contrast. Angiographic images of the Circle of Willis were obtained using MRA technique without intravenous contrast. Angiographic images of the neck were obtained using MRA technique without intravenous contrast. Carotid stenosis measurements (when applicable) are obtained utilizing NASCET criteria, using the distal internal carotid diameter as the denominator. COMPARISON:  Head CT earlier today.  Brain MRI 08/09/2010 FINDINGS: MRI HEAD FINDINGS Brain: No acute infarction, hemorrhage, hydrocephalus, extra-axial collection or mass lesion. Very mild patchy FLAIR hyperintensity in the cerebral white matter from remote nonspecific insult without significant change since 2011. History suggests microvascular ischemia in the setting of hypertension. There is also history of migraines. Vascular: Arterial findings below.  Normal flow voids. Skull and upper cervical spine: Negative for marrow lesion. 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 MRA HEAD FINDINGS Generalized mild motion artifact. Right dominant vertebral artery with only a tiny left vertebral continue into the basilar. Vertebral, basilar, and carotid vessels are smooth and diffusely patent. Apparent atheromatous irregularity of medium size MCA branches may be from artifact. 2 mm anterior bulge in the anterior communicating artery region. MRA NECK FINDINGS Non covered great vessel origins, which is a typical limitation by MRA. The right vertebral artery is dominant. Vessels are smooth and widely patent. No evidence of atheromatous disease. IMPRESSION: Brain MRI: No emergent finding or change from 2011. Intracranial MRA: 1. No emergent finding or flow limiting stenosis. There is limitation due to motion  artifact. 2. 2 mm bulbous area in the anterior communicating artery region, consider 1 year follow-up MRA. Neck MRA: Negative. Electronically Signed   By: Monte Fantasia M.D.   On: 06/25/2019 04:37   Dg Chest Port 1 View  Result Date: 06/24/2019 CLINICAL DATA:  Headaches with vomiting since this morning. EXAM: PORTABLE CHEST 1 VIEW COMPARISON:  Radiographs 01/19/2019 and 08/31/2016. FINDINGS: 1812 hours. The left costophrenic angle is partially excluded. The heart size and mediastinal contours are normal. The lungs are clear. There is no pleural effusion or pneumothorax. No acute osseous findings are identified. Telemetry leads overlie the chest. IMPRESSION: No evidence of active cardiopulmonary process. Electronically Signed   By: Richardean Sale M.D.   On: 06/24/2019 19:00    Pending Labs Unresulted Labs (From admission, onward)    Start     Ordered   06/26/19 0500  CBC  Tomorrow morning,   R     06/25/19 0744   06/26/19 [id number]  Basic metabolic panel  Tomorrow morning,   R     06/25/19 0744   06/26/19 0500  Blood gas, arterial  Tomorrow morning,   R     06/25/19 0744   06/26/19 0500  Procalcitonin  Daily,  R     06/25/19 0745   06/25/19 0759  Hemoglobin A1c  Once,   STAT     06/25/19 0758   06/25/19 0740  Amylase  Once,   STAT     06/25/19 0744   06/25/19 0740  Lipase, blood  Once,   STAT     06/25/19 0744   06/25/19 0740  Lactic acid, plasma  STAT Now then every 3 hours,   R     06/25/19 0744   06/25/19 0740  Urinalysis, Routine w reflex microscopic  Once,   STAT     06/25/19 0744   06/25/19 0738  HIV Antibody (routine testing w rflx)  (HIV Antibody (Routine testing w reflex) panel)  Once,   STAT     06/25/19 0744   06/25/19 0738  HIV4GL Save Tube  (HIV Antibody (Routine testing w reflex) panel)  Once,   STAT     06/25/19 0744   06/25/19 0738  Comprehensive metabolic panel  Once,   STAT     06/25/19 0744   06/25/19 0738  Magnesium  Once,   STAT     06/25/19 0744   06/25/19 0738   Phosphorus  Once,   STAT     06/25/19 0744   06/25/19 0738  Procalcitonin  Once,   STAT     06/25/19 0744   06/25/19 0738  Cortisol  Once,   STAT     06/25/19 0744   06/25/19 0738  CBC  Once,   STAT     06/25/19 0744   06/25/19 0738  Protime-INR  Once,   STAT     06/25/19 0744   06/25/19 0738  APTT  Once,   STAT     06/25/19 0744   06/25/19 0738  Culture, blood (routine x 2)  BLOOD CULTURE X 2,   R     06/25/19 0744   06/25/19 0738  Urine culture  Once,   STAT     06/25/19 0744   06/25/19 0738  Strep pneumoniae urinary antigen  Once,   STAT     06/25/19 0744   06/25/19 0735  Urine rapid drug screen (hosp performed)  ONCE - STAT,   STAT     06/25/19 0737   06/25/19 0556  Lactic acid, plasma  Now then every 2 hours,   STAT     06/25/19 0557   06/25/19 0556  Urine culture  ONCE - STAT,   STAT     06/25/19 0557   06/25/19 0235  Culture, blood (routine x 2)  BLOOD CULTURE X 2,   STAT     06/25/19 0235          Vitals/Pain Today's Vitals   06/25/19 0600 06/25/19 0615 06/25/19 0630 06/25/19 0715  BP: (!) 172/119 (!) 181/101 (!) 163/93 (!) 184/95  Pulse:  (!) 102 (!) 114   Resp:   (!) 21   Temp:   (!) 100.7 F (38.2 C)   TempSrc:   Oral   SpO2:  95% 97%   Weight:      Height:      PainSc:        Isolation Precautions No active isolations  Medications Medications  iohexol (OMNIPAQUE) 350 MG/ML injection 100 mL (has no administration in time range)  clevidipine (CLEVIPREX) infusion 0.5 mg/mL (16 mg/hr Intravenous New Bag/Given 06/25/19 0645)  metroNIDAZOLE (FLAGYL) IVPB 500 mg (has no administration in time range)  vancomycin (VANCOCIN) 2,000 mg in sodium chloride 0.9 %  500 mL IVPB (2,000 mg Intravenous New Bag/Given 06/25/19 0725)  vancomycin (VANCOCIN) IVPB 750 mg/150 ml premix (has no administration in time range)  ceFEPIme (MAXIPIME) 2 g in sodium chloride 0.9 % 100 mL IVPB (has no administration in time range)  hydrALAZINE (APRESOLINE) injection 10-40 mg (has no  administration in time range)  cloNIDine (CATAPRES - Dosed in mg/24 hr) patch 0.3 mg (has no administration in time range)  pantoprazole (PROTONIX) injection 40 mg (has no administration in time range)  0.9 %  sodium chloride infusion (has no administration in time range)  ondansetron (ZOFRAN) injection 4 mg (has no administration in time range)  0.9 %  sodium chloride infusion (has no administration in time range)  fentaNYL (SUBLIMAZE) injection 25-100 mcg (has no administration in time range)  sodium chloride 0.9 % bolus 1,000 mL (0 mLs Intravenous Stopped 06/24/19 2003)  hydrALAZINE (APRESOLINE) injection 10 mg (10 mg Intravenous Given 06/24/19 1748)  ondansetron (ZOFRAN) injection 4 mg (4 mg Intravenous Given 06/24/19 2052)  ondansetron (ZOFRAN-ODT) disintegrating tablet 4 mg (4 mg Oral Given 06/25/19 0132)  sodium chloride 0.9 % bolus 1,000 mL ( Intravenous Rate/Dose Change 06/25/19 0611)  metoCLOPramide (REGLAN) injection 10 mg (10 mg Intravenous Given 06/25/19 0301)  diphenhydrAMINE (BENADRYL) injection 25 mg (25 mg Intravenous Given 06/25/19 0301)  ceFEPIme (MAXIPIME) 2 g in sodium chloride 0.9 % 100 mL IVPB (0 g Intravenous Stopped 06/25/19 0721)  sodium chloride 0.9 % bolus 2,000 mL (2,000 mLs Intravenous New Bag/Given 06/25/19 0710)    Mobility walks Low fall risk   Focused Assessments Cardiac Assessment Handoff:  Cardiac Rhythm: Sinus tachycardia Lab Results  Component Value Date   TROPONINI <0.03 01/19/2019   Lab Results  Component Value Date   DDIMER 0.31 08/31/2016   Does the Patient currently have chest pain? No     R Recommendations: See Admitting Provider Note  Report given to:   Additional Notes:

## 2019-06-25 NOTE — Progress Notes (Signed)
Pharmacy Antibiotic Note  [name] is a 48 y.o. male admitted on 06/24/2019 with sepsis and unknown source.  Pharmacy has been consulted for cefepime and vancomycin dosing.  Presenting from [facility] with migraine sx, N/V, and confusion. MRI showing no emergent finding but 2 mm bulbous area in anterior communicating artery region. WBC 6.8, LA 2>2.7, temp now 101.8. Scr 2.83 (CrCl 36 mL/min) - known CKD.  Plan: Vancomycin 2 g IV once then 750 mg IV every 24 hours (estAUC 495)  Cefepime 2 g IV every 12 hours Monitor renal fx, cx results, clinical pic, and vanc levels as appropriate  Height: 5\' 9"  (175.3 cm) Weight: 220 lb (99.8 kg) IBW/kg (Calculated) : 70.7  Temp (24hrs), Avg:100.5 F (38.1 C), Min:99.1 F (37.3 C), Max:101.8 F (38.8 C)  Recent Labs  Lab 06/24/19 1808 06/24/19 1809 06/25/19 0437  WBC 6.8  --   --   CREATININE 2.83*  --   --   LATICACIDVEN  --  2.0* 2.7*    Estimated Creatinine Clearance: 36.4 mL/min (A) (by C-G formula based on SCr of 2.83 mg/dL (H)).    Allergies  Allergen Reactions  . Bee Venom Anaphylaxis  . Prochlorperazine Edisylate Other (See Comments)    Reaction: Jittery,uncomfortable feeling  . Cyclobenzaprine Hcl Other (See Comments)    REACTION: feel anxious, nausea  . Aleve [Naproxen Sodium] Nausea And Vomiting    States that this medication also causes abdominal pain  . Carbamazepine     REACTION: unknown reaction  . Geodon [Ziprasidone Hcl] Nausea And Vomiting  . Ibuprofen Nausea And Vomiting    States that this medication also causes abdominal pain  . Maxzide [Hydrochlorothiazide W-Triamterene]     headache  . Nsaids Other (See Comments)    H/o severe anemia from UGIB from ulcer  . Tylenol [Acetaminophen] Nausea And Vomiting    Patient states that this medication also causes abdominal pain  . Other Nausea And Vomiting, Rash and Other (See Comments)    ALL MUSCLE RELAXANTS: states that they affect sciatic nerve, may cause nausea and  vomiting, rash    Antimicrobials this admission: Vancomycin 10/6 >>  Cefepime 10/6 >>   Dose adjustments this admission: N/A  Microbiology results: 10/6 BCx: sent 10/6 UCx: sent  10/5 COVID: neg   Thank you for allowing pharmacy to be a part of this patient's care.  Antonietta Jewel, PharmD, BCCCP Clinical Pharmacist   Please check AMION for all [place] phone numbers After 10:00 PM, call [place] [phone] 06/25/2019 6:00 AM

## 2019-06-25 NOTE — ED Notes (Signed)
Pt is sinus tach w/BBB on monitor 

## 2019-06-25 NOTE — ED Notes (Signed)
Pt transported from [facility] with complaint of migraine and HTN. Cleviprex running in route. Pt lost IV access and did not receive Cleviprex for approx. 10 minutes with transport until new site was placed. Vitals in route 197/112, HR 95, 99% RA. 4mg  ODT Zofran @ 0145

## 2019-06-25 NOTE — Consult Note (Signed)
Cardiology Consultation:   Patient ID: [name] MRN: [id number]; DOB: 05/14/1971  Admit date: 06/24/2019 Date of Consult: 06/25/2019  Primary Care Provider: Fayrene Helper, MD Primary Cardiologist: Kate Sable, MD  Primary Electrophysiologist:  None    Patient Profile:   [name] is a 48 y.o. male with a hx of uncontrolled HTN, HL, migraines, CKD stage IV and chronic back pain who is being seen today for the evaluation of tachycardia and severe hypertension at the request of Dr. Nelda Marseille.  History of Present Illness:   [name] is a 48 yo male with PMH noted above. He has been followed by Dr. Bronson Ing  as an outpatient.  He was hospitalized in April 2019 with uncontrolled hypertension, headache, nausea and photophobia.  Reported history of migraines.  His clonidine was increased at that time and was set up to be seen back in the office for follow-up.  Last seen by Kerin Ransom on 01/2018 and follow-up and said that he had been doing well since discharge.  Had no further headaches.  Was scheduled to see neurology as an outpatient, and was taking all of his medications as prescribed. Notes some lower extremity edema which was felt could be related to high-dose Norvasc.  He is instructed that he may need a diuretic if this continued.  Was plan to follow-up in 6 weeks. Did have a echo 05/2017 showing EF of 55-60%, G1DD.  He presented to the [facility] on 10/5 with complaints of severe headache, nausea, vomiting and abdominal pain.  It was noted that at that time the patient was confused and difficult to obtain accurate HPI.  Febrile with temp max 101. Labs on admission showed potassium 3.4, creatinine 2.8, lactic acid 2, hemoglobin 11.8, high-sensitivity troponin 19. EKG showed SB with TWI in inferolateral leads. CXR neg. CT head was negative for acute findings. He was noted to be extremely hypertensive on arrival with systolic [id number]. Placed on Cleviprex. Transferred to [facility] for  further management under PCCM.   He is now alert and oriented. Difficult to obtain much information as he is brief with his answers. Reports diffuse abd pain, n/v and headache prior to admission. No chest pain or shortness of breath. Had been unable to take his blood pressure meds for at least one day prior to admission. MRI showed 2.69mm bulbous area in the anterior communicating region. Recommendation for 1 year follow up. Blood pressures have improved, but he became tachycardic. Review of telemetry shows mostly ST with PVCs, PACs. EKG reported accelerated junctional rhythm, but looks more like ST with PACs. He is being transitioned to IV labetalol.    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 Authorizing Provider  amLODipine (NORVASC) 10 MG tablet Take 1 tablet (10 mg total) by mouth daily. 10/16/18  Yes Fayrene Helper, MD  cloNIDine (CATAPRES) 0.3 MG tablet Take 1 tablet (0.3 mg total) by mouth 3 (three) times daily. 01/08/19  Yes Fayrene Helper, MD  esomeprazole (NEXIUM) 20 MG capsule Take 20 mg  by mouth daily at 12 noon.   Yes [provider]  hydrALAZINE (APRESOLINE) 100 MG tablet Take 1 tablet (100 mg total) by mouth 3 (three) times daily. 10/16/18  Yes Fayrene Helper, MD  hydrOXYzine (VISTARIL) 50 MG capsule Take 50 mg by mouth 2 (two) times daily as needed for anxiety or itching.  12/03/18  Yes [provider]  ondansetron (ZOFRAN) 8 MG tablet Take 1 tablet (8 mg total) by mouth every 4 (four) hours as needed. Patient taking differently: Take 8 mg by mouth every 4 (four) hours as needed for nausea or vomiting.  01/19/19  Yes Nat Christen, MD    Inpatient Medications: Scheduled Meds:  Chlorhexidine Gluconate Cloth  6 each Topical Daily   cloNIDine  0.3 mg Transdermal Weekly   pantoprazole (PROTONIX) IV  40 mg Intravenous QHS   Continuous Infusions:  sodium chloride 75 mL/hr at 06/25/19 1200   sodium chloride     acetaminophen     ceFEPime (MAXIPIME) IV     labetalol (NORMODYNE) infusion     [START ON 06/26/2019] vancomycin     PRN Meds: fentaNYL (SUBLIMAZE) injection, hydrALAZINE, iohexol, ondansetron (ZOFRAN) IV  Allergies:    Allergies  Allergen Reactions   Bee Venom Anaphylaxis   Prochlorperazine Edisylate Other (See Comments)    Reaction: Jittery,uncomfortable feeling   Cyclobenzaprine Hcl Other (See Comments)    REACTION: feel anxious, nausea   Aleve [Naproxen Sodium] Nausea And Vomiting    States that this medication also causes abdominal pain   Carbamazepine     REACTION: unknown reaction   Geodon [Ziprasidone Hcl] Nausea And Vomiting   Ibuprofen Nausea And Vomiting    States that this medication also causes abdominal pain   Maxzide [Hydrochlorothiazide W-Triamterene]     headache   Nsaids Other (See Comments)    H/o severe anemia from UGIB from ulcer   Tylenol [Acetaminophen] Nausea And Vomiting    Patient states that this medication also causes abdominal pain   Other Nausea And Vomiting, Rash and Other (See  Comments)    ALL MUSCLE RELAXANTS: states that they affect sciatic nerve, may cause nausea and vomiting, rash    Social History:   Social History   Socioeconomic History   Marital status: Married    Spouse name: Not on file   Number of children: 4   Years of education: Not on file   Highest education level: Not on file  Occupational History   Occupation: unemployed   Social Designer, fashion/clothing strain: Not on file   Food insecurity    Worry: Not on file    Inability: Not on file   Transportation needs    Medical: Not on file    Non-medical: Not on file  Tobacco Use   Smoking status: Former Smoker    Packs/day: 0.10    Years: 15.00    Pack years: 1.50    Types: Cigarettes    Quit date: 09/15/2012  Years since quitting: 6.7   Smokeless tobacco: Never Used  Substance and Sexual Activity   Alcohol use: No   Drug use: No   Sexual activity: Not Currently  Lifestyle   Physical activity    Days per week: Not on file    Minutes per session: Not on file   Stress: Not on file  Relationships   Social connections    Talks on phone: Not on file    Gets together: Not on file    Attends religious service: Not on file    Active member of club or organization: Not on file    Attends meetings of clubs or organizations: Not on file    Relationship status: Not on file   Intimate partner violence    Fear of current or ex partner: Not on file    Emotionally abused: Not on file    Physically abused: Not on file    Forced sexual activity: Not on file  Other Topics Concern   Not on file  Social History Narrative   Not on file    Family History:    Family History  Problem Relation Age of Onset   Diabetes Mother    Hypertension Mother    Hypertension Father    Arthritis Father    Diabetes Father      ROS:  Please see the history of present illness.   All other ROS reviewed and negative.     Physical Exam/Data:   Vitals:   06/25/19 1145  06/25/19 1200 06/25/19 1215 06/25/19 1230  BP: (!) 164/102 (!) 160/72 (!) 153/99 (!) 148/84  Pulse: 91 (!) 118 94 (!) 114  Resp: 20 (!) 26 18 16   Temp: 100.2 F (37.9 C)     TempSrc: Oral     SpO2: 99% 94% 95% 95%  Weight:      Height:        Intake/Output Summary (Last 24 hours) at 06/25/2019 1237 Last data filed at 06/25/2019 1200 Gross per 24 hour  Intake 2160.27 ml  Output 2225 ml  Net -64.73 ml   Last 3 Weights 06/24/2019 05/20/2019 01/19/2019  Weight (lbs) 220 lb 230 lb 217 lb 13 oz  Weight (kg) 99.791 kg 104.327 kg 98.8 kg     Body mass index is 32.49 kg/m.  General:  Well nourished, well developed, in no acute distress. Laying in bed. HEENT: normal Neck: no JVD Endocrine:  No thryomegaly Vascular: No carotid bruits Cardiac:  Tachy S1, S2; RRR; no murmur  Lungs:  clear to auscultation bilaterally, no wheezing, rhonchi or rales  Abd: soft, nontender, no hepatomegaly  Ext: no edema Musculoskeletal:  No deformities, BUE and BLE strength normal and equal Skin: warm and dry  Neuro:  CNs 2-12 intact, no focal abnormalities noted Psych:  Normal affect   EKG:  The EKG was personally reviewed and demonstrates:  SB with TWI in inferolateral leads (10/5) ST with PACs, LVH (10/6) Telemetry:  Telemetry was personally reviewed and demonstrates:  ST PACs PVCs ( no junctional rhythms noted)  Relevant CV Studies:  TTE: 05/23/19  Study Conclusions  - Left ventricle: The cavity size was at the upper limits of   normal. Wall thickness was increased in a pattern of severe LVH.   Systolic function was normal. The estimated ejection fraction was   in the range of 55% to 60%. Wall motion was normal; there were no   regional wall motion abnormalities. Doppler parameters are   consistent with abnormal  left ventricular relaxation (grade 1   diastolic dysfunction). - Aortic valve: Probable Lambl&'s excrescence noted on parasternal   long axis view, left coronary cusp. Cannot completely  exclude a   small vegetation in light of aortic regurgitation, however would   correlate this clinically with patient&'s presentation. There was   mild regurgitation. - Mitral valve: Mildly calcified annulus. There was trivial   regurgitation. - Right atrium: Central venous pressure (est): 3 mm Hg. - Tricuspid valve: There was trivial regurgitation. - Pericardium, extracardiac: A small pericardial effusion was   identified posterior to the heart.  Laboratory Data:  High Sensitivity Troponin:   Recent Labs  Lab 06/24/19 1808 06/24/19 1952  TROPONINIHS 19* 17     Chemistry Recent Labs  Lab 06/24/19 1808 06/25/19 0956  NA 143 141  K 3.4* 2.9*  CL 108 104  CO2 23 23  GLUCOSE 121* 115*  BUN 24* 18  CREATININE 2.83* 2.53*  CALCIUM 8.9 8.8*  GFRNONAA 25* 28*  GFRAA 29* 33*  ANIONGAP 12 14    Recent Labs  Lab 06/24/19 1808 06/25/19 0956  PROT 7.4 7.6  ALBUMIN 4.7 4.9  AST 25 23  ALT 14 13  ALKPHOS 59 65  BILITOT 1.1 1.6*   Hematology Recent Labs  Lab 06/24/19 1808 06/25/19 0556 06/25/19 0956  WBC 6.8 9.1 10.2  RBC 3.68* 4.22 3.96*  HGB 11.8* 14.1 12.9*  HCT 35.4* 39.9 37.9*  MCV 96.2 94.5 95.7  MCH 32.1 33.4 32.6  MCHC 33.3 35.3 34.0  RDW 10.9* 11.3* 11.4*  PLT 257 293 287   BNPNo results for input(s): BNP, PROBNP in the last 168 hours.  DDimer No results for input(s): DDIMER in the last 168 hours.   Radiology/Studies:  Ct Head Wo Contrast  Result Date: 06/24/2019 CLINICAL DATA:  Altered level of consciousness, unexplained. EXAM: CT HEAD WITHOUT CONTRAST TECHNIQUE: Contiguous axial images were obtained from the base of the skull through the vertex without intravenous contrast. COMPARISON:  Head CT dated 01/19/2019 FINDINGS: Brain: No hydrocephalus. No mass, hemorrhage, edema or other evidence of acute parenchymal abnormality. No extra-axial hemorrhage. Vascular: No hyperdense vessel or unexpected calcification. Skull: Normal. Negative for fracture or  focal lesion. Sinuses/Orbits: No acute finding. Other: None. IMPRESSION: Negative head CT. No intracranial mass, hemorrhage or edema. Electronically Signed   By: Franki Cabot M.D.   On: 06/24/2019 19:52   Mr Angio Head Wo Contrast  Result Date: 06/25/2019 CLINICAL DATA:  Malignant hypertension with altered mental status EXAM: MRI HEAD WITHOUT CONTRAST MRA HEAD WITHOUT CONTRAST MRA NECK WITHOUT CONTRAST TECHNIQUE: Multiplanar, multiecho pulse sequences of the brain and surrounding structures were obtained without intravenous contrast. Angiographic images of the Circle of Willis were obtained using MRA technique without intravenous contrast. Angiographic images of the neck were obtained using MRA technique without intravenous contrast. Carotid stenosis measurements (when applicable) are obtained utilizing NASCET criteria, using the distal internal carotid diameter as the denominator. COMPARISON:  Head CT earlier today.  Brain MRI 08/09/2010 FINDINGS: MRI HEAD FINDINGS Brain: No acute infarction, hemorrhage, hydrocephalus, extra-axial collection or mass lesion. Very mild patchy FLAIR hyperintensity in the cerebral white matter from remote nonspecific insult without significant change since 2011. History suggests microvascular ischemia in the setting of hypertension. There is also history of migraines. Vascular: Arterial findings below.  Normal flow voids. Skull and upper cervical spine: Negative for marrow lesion. 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 MRA HEAD FINDINGS Generalized mild motion artifact. Right dominant  vertebral artery with only a tiny left vertebral continue into the basilar. Vertebral, basilar, and carotid vessels are smooth and diffusely patent. Apparent atheromatous irregularity of medium size MCA branches may be from artifact. 2 mm anterior bulge in the anterior communicating artery region. MRA NECK FINDINGS Non covered great vessel origins, which is  a typical limitation by MRA. The right vertebral artery is dominant. Vessels are smooth and widely patent. No evidence of atheromatous disease. IMPRESSION: Brain MRI: No emergent finding or change from 2011. Intracranial MRA: 1. No emergent finding or flow limiting stenosis. There is limitation due to motion artifact. 2. 2 mm bulbous area in the anterior communicating artery region, consider 1 year follow-up MRA. Neck MRA: Negative. Electronically Signed   By: Monte Fantasia M.D.   On: 06/25/2019 04:37   Mr Angio Neck Wo Contrast  Result Date: 06/25/2019 CLINICAL DATA:  Malignant hypertension with altered mental status EXAM: MRI HEAD WITHOUT CONTRAST MRA HEAD WITHOUT CONTRAST MRA NECK WITHOUT CONTRAST TECHNIQUE: Multiplanar, multiecho pulse sequences of the brain and surrounding structures were obtained without intravenous contrast. Angiographic images of the Circle of Willis were obtained using MRA technique without intravenous contrast. Angiographic images of the neck were obtained using MRA technique without intravenous contrast. Carotid stenosis measurements (when applicable) are obtained utilizing NASCET criteria, using the distal internal carotid diameter as the denominator. COMPARISON:  Head CT earlier today.  Brain MRI 08/09/2010 FINDINGS: MRI HEAD FINDINGS Brain: No acute infarction, hemorrhage, hydrocephalus, extra-axial collection or mass lesion. Very mild patchy FLAIR hyperintensity in the cerebral white matter from remote nonspecific insult without significant change since 2011. History suggests microvascular ischemia in the setting of hypertension. There is also history of migraines. Vascular: Arterial findings below.  Normal flow voids. Skull and upper cervical spine: Negative for marrow lesion. 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 MRA HEAD FINDINGS Generalized mild motion artifact. Right dominant vertebral artery with only a tiny left vertebral  continue into the basilar. Vertebral, basilar, and carotid vessels are smooth and diffusely patent. Apparent atheromatous irregularity of medium size MCA branches may be from artifact. 2 mm anterior bulge in the anterior communicating artery region. MRA NECK FINDINGS Non covered great vessel origins, which is a typical limitation by MRA. The right vertebral artery is dominant. Vessels are smooth and widely patent. No evidence of atheromatous disease. IMPRESSION: Brain MRI: No emergent finding or change from 2011. Intracranial MRA: 1. No emergent finding or flow limiting stenosis. There is limitation due to motion artifact. 2. 2 mm bulbous area in the anterior communicating artery region, consider 1 year follow-up MRA. Neck MRA: Negative. Electronically Signed   By: Monte Fantasia M.D.   On: 06/25/2019 04:37   Mr Brain Wo Contrast  Result Date: 06/25/2019 CLINICAL DATA:  Malignant hypertension with altered mental status EXAM: MRI HEAD WITHOUT CONTRAST MRA HEAD WITHOUT CONTRAST MRA NECK WITHOUT CONTRAST TECHNIQUE: Multiplanar, multiecho pulse sequences of the brain and surrounding structures were obtained without intravenous contrast. Angiographic images of the Circle of Willis were obtained using MRA technique without intravenous contrast. Angiographic images of the neck were obtained using MRA technique without intravenous contrast. Carotid stenosis measurements (when applicable) are obtained utilizing NASCET criteria, using the distal internal carotid diameter as the denominator. COMPARISON:  Head CT earlier today.  Brain MRI 08/09/2010 FINDINGS: MRI HEAD FINDINGS Brain: No acute infarction, hemorrhage, hydrocephalus, extra-axial collection or mass lesion. Very mild patchy FLAIR hyperintensity in the cerebral white matter from remote nonspecific insult without  significant change since 2011. History suggests microvascular ischemia in the setting of hypertension. There is also history of migraines. Vascular:  Arterial findings below.  Normal flow voids. Skull and upper cervical spine: Negative for marrow lesion. 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 MRA HEAD FINDINGS Generalized mild motion artifact. Right dominant vertebral artery with only a tiny left vertebral continue into the basilar. Vertebral, basilar, and carotid vessels are smooth and diffusely patent. Apparent atheromatous irregularity of medium size MCA branches may be from artifact. 2 mm anterior bulge in the anterior communicating artery region. MRA NECK FINDINGS Non covered great vessel origins, which is a typical limitation by MRA. The right vertebral artery is dominant. Vessels are smooth and widely patent. No evidence of atheromatous disease. IMPRESSION: Brain MRI: No emergent finding or change from 2011. Intracranial MRA: 1. No emergent finding or flow limiting stenosis. There is limitation due to motion artifact. 2. 2 mm bulbous area in the anterior communicating artery region, consider 1 year follow-up MRA. Neck MRA: Negative. Electronically Signed   By: Monte Fantasia M.D.   On: 06/25/2019 04:37   Dg Chest Port 1 View  Result Date: 06/24/2019 CLINICAL DATA:  Headaches with vomiting since this morning. EXAM: PORTABLE CHEST 1 VIEW COMPARISON:  Radiographs 01/19/2019 and 08/31/2016. FINDINGS: 1812 hours. The left costophrenic angle is partially excluded. The heart size and mediastinal contours are normal. The lungs are clear. There is no pleural effusion or pneumothorax. No acute osseous findings are identified. Telemetry leads overlie the chest. IMPRESSION: No evidence of active cardiopulmonary process. Electronically Signed   By: Richardean Sale M.D.   On: 06/24/2019 19:00    Assessment and Plan:   ZYLER GAYE is a 48 y.o. male with a hx of uncontrolled HTN, HL, migraines, CKD stage IV and chronic back pain who is being seen today for the evaluation of tachycardia and severe hypertension at  the request of Dr. Nelda Marseille.  1. Severe HTN: systolic Bp [id number] on admission. Being weaned from Cleviprex. Blood pressure much improved. IV labetalol ordered. Patient and wife report he has been complaint with home medications, but unable to take for at least a day 2/2 to n/v and abd pain. As he is able to take orals would transition back to home medications.   2. Tachycardia: Review of telemetry shows ST with PACs, PVCs. This was actually improving as interviewed. Agree with IV labetalol until able to resume orals.  3. Migraine headaches: hx of same. CT head neg. MRI showed 2.54mm bulbous area in the anterior communicating region. Recommendation for 1 year follow up.  4. Sepsis? Febrile: lactic acid elevated at 2.7. Pan cultured, on antibiotics. Management per primary  5. CKD IV: baseline around 2.5.   6. Abnormal EKG: on admission EKG showed SB with deep TWI in inferolateral leads. HsT 19. No chest pain prior to admission. Does not appear to have had ischemic work up in the past. Can further address as outpatient. Repeat EKG once HR improves.  For questions or updates, please contact [place] Please consult [email or web address] for contact info under     Signed, Reino Bellis, NP  06/25/2019 12:37 PM

## 2019-06-25 NOTE — ED Notes (Signed)
Patient called out to say that his IV "fell out". Judson Roch, Rn assessed site and was able to save IV site.

## 2019-06-25 NOTE — ED Notes (Signed)
Critical lactic at 2.7, reported to Roxanne Mins, MD

## 2019-06-26 DIAGNOSIS — N179 Acute kidney failure, unspecified: Secondary | ICD-10-CM

## 2019-06-26 DIAGNOSIS — A419 Sepsis, unspecified organism: Secondary | ICD-10-CM

## 2019-06-26 DIAGNOSIS — I161 Hypertensive emergency: Principal | ICD-10-CM

## 2019-06-26 LAB — URINE CULTURE: Culture: <10000 — AB

## 2019-06-26 LAB — POCT I-STAT 7, (LYTES, BLD GAS, ICA,H+H)
Acid-base deficit: 2 mmol/L (ref 0.0–2.0)
Bicarbonate: 22.6 mmol/L (ref 20.0–28.0)
Calcium, Ion: 1.16 mmol/L (ref 1.15–1.40)
HCT: 30 % — ABNORMAL LOW (ref 39.0–52.0)
Hemoglobin: 10.2 g/dL — ABNORMAL LOW (ref 13.0–17.0)
O2 Saturation: 96 %
Patient temperature: 98.4
Potassium: 3.9 mmol/L (ref 3.5–5.1)
Sodium: 142 mmol/L (ref 135–145)
TCO2: 24 mmol/L (ref 22–32)
pCO2 arterial: 36.6 mmHg (ref 32.0–48.0)
pH, Arterial: 7.398 (ref 7.350–7.450)
pO2, Arterial: 81 mmHg — ABNORMAL LOW (ref 83.0–108.0)

## 2019-06-26 LAB — CBC
HCT: 30.5 % — ABNORMAL LOW (ref 39.0–52.0)
Hemoglobin: 10.4 g/dL — ABNORMAL LOW (ref 13.0–17.0)
MCH: 33 pg (ref 26.0–34.0)
MCHC: 34.1 g/dL (ref 30.0–36.0)
MCV: 96.8 fL (ref 80.0–100.0)
Platelets: 216 10*3/uL (ref 150–400)
RBC: 3.15 MIL/uL — ABNORMAL LOW (ref 4.22–5.81)
RDW: 11.8 % (ref 11.5–15.5)
WBC: 8.6 10*3/uL (ref 4.0–10.5)
nRBC: 0 % (ref 0.0–0.2)

## 2019-06-26 LAB — BASIC METABOLIC PANEL
Anion gap: 12 (ref 5–15)
BUN: 27 mg/dL — ABNORMAL HIGH (ref 6–20)
CO2: 22 mmol/L (ref 22–32)
Calcium: 8.3 mg/dL — ABNORMAL LOW (ref 8.9–10.3)
Chloride: 107 mmol/L (ref 98–111)
Creatinine, Ser: 3.33 mg/dL — ABNORMAL HIGH (ref 0.61–1.24)
GFR calc Af Amer: 24 mL/min — ABNORMAL LOW (ref >60)
GFR calc non Af Amer: 20 mL/min — ABNORMAL LOW (ref >60)
Glucose, Bld: 118 mg/dL — ABNORMAL HIGH (ref 70–99)
Potassium: 3.8 mmol/L (ref 3.5–5.1)
Sodium: 141 mmol/L (ref 135–145)

## 2019-06-26 LAB — MRSA PCR SCREENING: MRSA by PCR: NEGATIVE

## 2019-06-26 LAB — GLUCOSE, CAPILLARY
Glucose-Capillary: 103 mg/dL — ABNORMAL HIGH (ref 70–99)
Glucose-Capillary: 105 mg/dL — ABNORMAL HIGH (ref 70–99)

## 2019-06-26 LAB — PROCALCITONIN: Procalcitonin: 0.17 ng/mL

## 2019-06-26 LAB — MAGNESIUM: Magnesium: 1.7 mg/dL (ref 1.7–2.4)

## 2019-06-26 MED ORDER — METOPROLOL TARTRATE 12.5 MG HALF TABLET
12.5000 mg | ORAL_TABLET | Freq: Two times a day (BID) | ORAL | Status: DC
  Administered 2019-06-26 (×2): 12.5 mg via ORAL
  Filled 2019-06-26 (×2): qty 1

## 2019-06-26 MED ORDER — MAGNESIUM SULFATE IN D5W 1-5 GM/100ML-% IV SOLN
1.0000 g | Freq: Once | INTRAVENOUS | Status: AC
  Administered 2019-06-26: 13:00:00 1 g via INTRAVENOUS
  Filled 2019-06-26: qty 100

## 2019-06-26 MED ORDER — PANTOPRAZOLE SODIUM 40 MG PO TBEC
40.0000 mg | DELAYED_RELEASE_TABLET | Freq: Every day | ORAL | Status: DC
  Administered 2019-06-26 – 2019-06-29 (×4): 40 mg via ORAL
  Filled 2019-06-26 (×4): qty 1

## 2019-06-26 MED ORDER — AMLODIPINE BESYLATE 10 MG PO TABS
10.0000 mg | ORAL_TABLET | Freq: Every day | ORAL | Status: DC
  Administered 2019-06-26 – 2019-06-29 (×4): 10 mg via ORAL
  Filled 2019-06-26 (×4): qty 1

## 2019-06-26 MED ORDER — INFLUENZA VAC SPLIT QUAD 0.5 ML IM SUSY
0.5000 mL | PREFILLED_SYRINGE | INTRAMUSCULAR | Status: DC
  Filled 2019-06-26: qty 0.5

## 2019-06-26 MED ORDER — PNEUMOCOCCAL VAC POLYVALENT 25 MCG/0.5ML IJ INJ
0.5000 mL | INJECTION | INTRAMUSCULAR | Status: DC
  Filled 2019-06-26: qty 0.5

## 2019-06-26 MED ORDER — ENOXAPARIN SODIUM 30 MG/0.3ML ~~LOC~~ SOLN
30.0000 mg | SUBCUTANEOUS | Status: DC
  Administered 2019-06-26 – 2019-06-28 (×3): 30 mg via SUBCUTANEOUS
  Filled 2019-06-26 (×4): qty 0.3

## 2019-06-26 MED ORDER — BOOST / RESOURCE BREEZE PO LIQD CUSTOM
1.0000 | Freq: Three times a day (TID) | ORAL | Status: DC
  Administered 2019-06-26 – 2019-06-28 (×7): 1 via ORAL

## 2019-06-26 MED ORDER — HYDRALAZINE HCL 20 MG/ML IJ SOLN
10.0000 mg | INTRAMUSCULAR | Status: DC | PRN
  Administered 2019-06-27 – 2019-06-29 (×6): 20 mg via INTRAVENOUS
  Filled 2019-06-26 (×6): qty 1

## 2019-06-26 NOTE — Progress Notes (Signed)
Initial Nutrition Assessment  DOCUMENTATION CODES:   Not applicable  INTERVENTION:    Add Boost Breeze po TID, each supplement provides 250 kcal and 9 grams of protein  NUTRITION DIAGNOSIS:   Inadequate oral intake related to poor appetite as evidenced by per patient/family report.  GOAL:   Patient will meet greater than or equal to 90% of their needs  MONITOR:   PO intake, Diet advancement, Labs  REASON FOR ASSESSMENT:   Malnutrition Screening Tool    ASSESSMENT:   48 yo male admitted with hypertensive emergency. PMH includes HTN, GERD, HLD, obesity.   Patient was not very communicative during RD visit. His head remained covered with blanket during our conversation. Unable to complete NFPE at this time. He reports recent poor appetite for a few days and weight loss. Diet has just been advanced to clear liquids. Patient reports history of intolerance to milk, questionable lactose intolerance. Per discussion with RN, patient ate cereal with milk before having stomach pain PTA, so he and wife think the milk is what made him sick.   Patient reports usual weight 210 lbs. Per review of weight encounters, he weighed 98.8 kg in May 2020, 14% weight loss within 5-6 months is significant for the time frame.  Patient is at nutrition risk given recent significant weight loss and poor intake.   Labs reviewed. BUN 27 (H), creatinine 3.33 (H) CBG's: [id number]  Medications reviewed and include magnesium sulfate.   NUTRITION - FOCUSED PHYSICAL EXAM:  deferred  Diet Order:   Diet Order            Diet clear liquid Room service appropriate? Yes; Fluid consistency: Thin  Diet effective now              EDUCATION NEEDS:   Not appropriate for education at this time  Skin:  Skin Assessment: Reviewed RN Assessment  Last BM:  PTA  Height:   Ht Readings from Last 1 Encounters:  06/25/19 5\' 9"  (1.753 m)    Weight:   Wt Readings from Last 1 Encounters:  06/26/19 85.3  kg    Ideal Body Weight:  72.7 kg  BMI:  Body mass index is 27.77 kg/m.  Estimated Nutritional Needs:   Kcal:  2200-2400  Protein:  110-135 gm  Fluid:  2.2-2.4 L    Molli Barrows, RD, LDN, CNSC Pager [phone] After Hours Pager [phone]

## 2019-06-26 NOTE — Progress Notes (Addendum)
Patient continues to endorse throbbing headache. PRN fentanyl given with some relief. Pt also complains of right hand pain at site of IV. Removed coban and IV due to swelling and pain. Advised patient to elevate and gave pillow to do so. Patient pleasant and willing to converse. Will continue to monitor.

## 2019-06-26 NOTE — Progress Notes (Addendum)
PULMONARY / CRITICAL CARE MEDICINE   NAME:  [name], MRN:  [id number], DOB:  16-Aug-1971, LOS: 1 ADMISSION DATE:  06/24/2019,  REFERRING MD: Emergency department physician, CHIEF COMPLAINT: Hypertension headache nausea vomiting  BRIEF HISTORY:    48 year old with a history of malignant hypertension, chronic kidney disease, who presents with nausea vomiting along with migraine headaches transferred from [facility] to come for further evaluation and treatment  HISTORY OF PRESENT ILLNESS   48 year old male with no malignant hypertension he presented and upon hospital 24 hours nausea vomiting and inability to take p.o. medication.  He was transferred to [facility] 06/25/2019 for MRI evaluation which was essentially negative except for bulging with anterior communicating artery which needs to be evaluated in 1 year approximately.  Noted to have an elevated lactic acid of 2.7 and mildly febrile therefore was started on sepsis protocol bolus with 2 L of fluid given cefepime, vancomycin, and Flagyl x1. Procalcitonin has been ordered and is in process at the time of this dictation.  He will be started on transdermal and IV antihypertensives and continue the Cleviprex until systolic blood pressure is better controlled.  Once his nausea vomiting is resolved he can be restarted on p.o. antihypertensives.  We will admit him to the intensive care unit and hopefully within 24 hours to be stabilized and transition out of the intensive care unit.  SIGNIFICANT PAST MEDICAL HISTORY   Malignant hypertension Chronic migraine headaches Chronic kidney disease History of tobacco abuse Lap Cholecystectomy 09/23/2018  SIGNIFICANT EVENTS:  06/24/2019 transferred from [facility] for MRI  STUDIES:   06/25/2019 MRI brain which revealed bulging anterior communicating artery without acute process.  CULTURES:  06/25/2019 blood cultures x2>>  ANTIBIOTICS:  06/25/2019  vancomycin>> 06/25/2019 cefepime>> 06/25/2019 Flagyl 1x   LINES/TUBES:    CONSULTANTS:  PCCM  SUBJECTIVE:  RN reports no significant events overnight. Patient states headache has improved but nausea persist. He denies any diarrhea or abdominal pain prior to admission.   CONSTITUTIONAL: BP (!) 143/86   Pulse 71   Temp 98.9 F (37.2 C) (Oral)   Resp 18   Ht 5\' 9"  (1.753 m)   Wt 85.3 kg   SpO2 97%   BMI 27.77 kg/m   I/O last 3 completed shifts: In: 4431.2 [I.V.:1999.3; IV Piggyback:2432] Out: 2575 [Urine:2575]     PHYSICAL EXAM: General: Middle aged adult male lying in bed in NAD HEENT: MM pink/moist, PERRL,  Neuro: Alert and oriented x3, no focal neuro deficits, able to follow all commands  CV: s1s2 regular rate and rhythm, no murmur, rubs, or gallops,  PULM:  Clear to ascultation bilaterally   GI: soft, bowel sounds active in all 4 quadrants, non-tender, non-distended Extremities: warm/dry, no edema  Skin: no rashes or lesions  RESOLVED PROBLEM LIST    ASSESSMENT AND PLAN   Refractory hypertension with history of Malignant hypertension  -in the setting of nausea vomiting inability to take p.o. medications for malignant hypertension. -Patient states he often experiences cycle of headache with nausea and vomiting resulting in inability to take PO meds which causes significant hypertension P:  Labetalol drip wean, SBP goal of less than 180  Continue PRN IV apresoline and transdermal Catapres  Resume home oral antihypertensives  Treatment for migraine headaches  Cardiology recommending resumption of oral clonidine and amlodipine with consideration of starting beta blocker when able to take oral meds   Migraine headaches Nausea and vomiting  - MRI showing 2.2  mm bulbous area and anterior commuting artery regions updated to be followed up with 1 year with MRA P: Adequate pain control  Blood pressure control  Tread nausea and vomiting  Slowly advance diet    Presumed sepsis in the setting of elevated lactic acid and febrile state. -Procalcitonin 0.17 P: Continue IV cefepime, discontinue IV vancomycin  Continue IV hydration  Follow cultures  Low threshold to stop antibiotics Obtain MRSA PCR   Acute kidney injury superimposed on Chronic kidney disease stage 3/4 -Baseline creatinine appears to be between 2.3-2.7 with GFR 25-30 P: Ensure adequate renal perfusion / avoid nephrotoxins Obtain urine lytes Continue IV hydration  Trend Bmets Monitor urine output   Obstructive sleep apnea  P: CPAP qHS   Best Practice / Goals of Care / Disposition.   DVT PROPHYLAXIS: SCD SUP:PPI NUTRITION:NPO MOBILITY:bedrest GOALS OF CARE:Full code FAMILY DISCUSSIONS: Pt updated at bedside DISPOSITION ICU admit  LABS  Glucose Recent Labs  Lab 06/25/19 0837 06/25/19 1156 06/25/19 1601 06/25/19 1942 06/25/19 2358 06/26/19 0402  GLUCAP 113* 113* 102* 117* 105* 103*    BMET Recent Labs  Lab 06/24/19 1808 06/25/19 0956 06/26/19 0248 06/26/19 0425  NA 143 141 141 142  K 3.4* 2.9* 3.8 3.9  CL 108 104 107  --   CO2 23 23 22   --   BUN 24* 18 27*  --   CREATININE 2.83* 2.53* 3.33*  --   GLUCOSE 121* 115* 118*  --     Liver Enzymes Recent Labs  Lab 06/24/19 1808 06/25/19 0956  AST 25 23  ALT 14 13  ALKPHOS 59 65  BILITOT 1.1 1.6*  ALBUMIN 4.7 4.9    Electrolytes Recent Labs  Lab 06/24/19 1808 06/25/19 0956 06/26/19 0248  CALCIUM 8.9 8.8* 8.3*  MG  --  1.6* 1.7  PHOS  --  2.0*  --     CBC Recent Labs  Lab 06/25/19 0556 06/25/19 0956 06/26/19 0248 06/26/19 0425  WBC 9.1 10.2 8.6  --   HGB 14.1 12.9* 10.4* 10.2*  HCT 39.9 37.9* 30.5* 30.0*  PLT 293 287 216  --     ABG Recent Labs  Lab 06/26/19 0425  PHART 7.398  PCO2ART 36.6  PO2ART 81.0*    Coag's Recent Labs  Lab 06/25/19 0556 06/25/19 0956  APTT 28 30  INR 1.1 1.1    Sepsis Markers Recent Labs  Lab 06/25/19 0437 06/25/19 0556  06/25/19 0956 06/26/19 0248  LATICACIDVEN 2.7* 1.8 1.8  --   PROCALCITON  --   --  <0.10 0.17   CRITICAL CARE Performed by: Johnsie Cancel   Total critical care time: 35 minutes  Critical care time was exclusive of separately billable procedures and treating other patients.  Critical care was necessary to treat or prevent imminent or life-threatening deterioration.  Critical care was time spent personally by me on the following activities: development of treatment plan with patient and/or surrogate as well as nursing, discussions with consultants, evaluation of patient's response to treatment, examination of patient, obtaining history from patient or surrogate, ordering and performing treatments and interventions, ordering and review of laboratory studies, ordering and review of radiographic studies, pulse oximetry and re-evaluation of patient's condition.   Johnsie Cancel, NP-C [facility] Pgr: [phone] or if no answer [phone] 06/26/2019, 9:18 AM

## 2019-06-26 NOTE — Progress Notes (Signed)
Patient c/o nausea medicated with relief. Patient sleeping states he feels some better. Will continue to monitor. Spoke with wife on phone this am she is concerned about him and states that he is very talkative as a norm.

## 2019-06-26 NOTE — Progress Notes (Signed)
Progress Note  Patient Name: [name] Date of Encounter: 06/26/2019  Primary Cardiologist: Kate Sable, MD   Subjective   Feels queasy but no vomiting. HA improved. No chest pain or dyspnea  Inpatient Medications    Scheduled Meds:  Chlorhexidine Gluconate Cloth  6 each Topical Daily   cloNIDine  0.3 mg Transdermal Weekly   pantoprazole (PROTONIX) IV  40 mg Intravenous QHS   Continuous Infusions:  sodium chloride 75 mL/hr at 06/26/19 0516   sodium chloride     ceFEPime (MAXIPIME) IV 2 g (06/26/19 0552)   labetalol (NORMODYNE) infusion 3 mg/min (06/26/19 0506)   vancomycin 750 mg (06/26/19 0630)   PRN Meds: fentaNYL (SUBLIMAZE) injection, hydrALAZINE, iohexol, ondansetron (ZOFRAN) IV   Vital Signs    Vitals:   06/26/19 0300 06/26/19 0405 06/26/19 0500 06/26/19 0717  BP:      Pulse: 73     Resp: 12     Temp:  98.4 F (36.9 C)  98.9 F (37.2 C)  TempSrc:  Oral  Oral  SpO2: 96%     Weight:   85.3 kg   Height:        Intake/Output Summary (Last 24 hours) at 06/26/2019 0727 Last data filed at 06/26/2019 0300 Gross per 24 hour  Intake 3331.24 ml  Output 1075 ml  Net 2256.24 ml   Last 3 Weights 06/26/2019 06/25/2019 06/24/2019  Weight (lbs) 188 lb 0.8 oz 184 lb 8.4 oz 220 lb  Weight (kg) 85.3 kg 83.7 kg 99.791 kg      Telemetry    NSR - Personally Reviewed  ECG    None today - Personally Reviewed  Physical Exam   GEN: No acute distress.   Neck: No JVD Cardiac: RRR, no murmurs, rubs, or gallops.  Respiratory: Clear to auscultation bilaterally. GI: Soft, nontender, non-distended  MS: No edema; No deformity. Neuro:  Nonfocal  Psych: Normal affect   Labs    High Sensitivity Troponin:   Recent Labs  Lab 06/24/19 1808 06/24/19 1952  TROPONINIHS 19* 17      Chemistry Recent Labs  Lab 06/24/19 1808 06/25/19 0956 06/26/19 0248 06/26/19 0425  NA 143 141 141 142  K 3.4* 2.9* 3.8 3.9  CL 108 104 107  --   CO2 23 23 22   --    GLUCOSE 121* 115* 118*  --   BUN 24* 18 27*  --   CREATININE 2.83* 2.53* 3.33*  --   CALCIUM 8.9 8.8* 8.3*  --   PROT 7.4 7.6  --   --   ALBUMIN 4.7 4.9  --   --   AST 25 23  --   --   ALT 14 13  --   --   ALKPHOS 59 65  --   --   BILITOT 1.1 1.6*  --   --   GFRNONAA 25* 28* 20*  --   GFRAA 29* 33* 24*  --   ANIONGAP 12 14 12   --      Hematology Recent Labs  Lab 06/25/19 0556 06/25/19 0956 06/26/19 0248 06/26/19 0425  WBC 9.1 10.2 8.6  --   RBC 4.22 3.96* 3.15*  --   HGB 14.1 12.9* 10.4* 10.2*  HCT 39.9 37.9* 30.5* 30.0*  MCV 94.5 95.7 96.8  --   MCH 33.4 32.6 33.0  --   MCHC 35.3 34.0 34.1  --   RDW 11.3* 11.4* 11.8  --   PLT 293 287 216  --  BNPNo results for input(s): BNP, PROBNP in the last 168 hours.   DDimer No results for input(s): DDIMER in the last 168 hours.   Radiology    Ct Head Wo Contrast  Result Date: 06/24/2019 CLINICAL DATA:  Altered level of consciousness, unexplained. EXAM: CT HEAD WITHOUT CONTRAST TECHNIQUE: Contiguous axial images were obtained from the base of the skull through the vertex without intravenous contrast. COMPARISON:  Head CT dated 01/19/2019 FINDINGS: Brain: No hydrocephalus. No mass, hemorrhage, edema or other evidence of acute parenchymal abnormality. No extra-axial hemorrhage. Vascular: No hyperdense vessel or unexpected calcification. Skull: Normal. Negative for fracture or focal lesion. Sinuses/Orbits: No acute finding. Other: None. IMPRESSION: Negative head CT. No intracranial mass, hemorrhage or edema. Electronically Signed   By: Franki Cabot M.D.   On: 06/24/2019 19:52   Mr Angio Head Wo Contrast  Result Date: 06/25/2019 CLINICAL DATA:  Malignant hypertension with altered mental status EXAM: MRI HEAD WITHOUT CONTRAST MRA HEAD WITHOUT CONTRAST MRA NECK WITHOUT CONTRAST TECHNIQUE: Multiplanar, multiecho pulse sequences of the brain and surrounding structures were obtained without intravenous contrast. Angiographic images of  the Circle of Willis were obtained using MRA technique without intravenous contrast. Angiographic images of the neck were obtained using MRA technique without intravenous contrast. Carotid stenosis measurements (when applicable) are obtained utilizing NASCET criteria, using the distal internal carotid diameter as the denominator. COMPARISON:  Head CT earlier today.  Brain MRI 08/09/2010 FINDINGS: MRI HEAD FINDINGS Brain: No acute infarction, hemorrhage, hydrocephalus, extra-axial collection or mass lesion. Very mild patchy FLAIR hyperintensity in the cerebral white matter from remote nonspecific insult without significant change since 2011. History suggests microvascular ischemia in the setting of hypertension. There is also history of migraines. Vascular: Arterial findings below.  Normal flow voids. Skull and upper cervical spine: Negative for marrow lesion. 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 MRA HEAD FINDINGS Generalized mild motion artifact. Right dominant vertebral artery with only a tiny left vertebral continue into the basilar. Vertebral, basilar, and carotid vessels are smooth and diffusely patent. Apparent atheromatous irregularity of medium size MCA branches may be from artifact. 2 mm anterior bulge in the anterior communicating artery region. MRA NECK FINDINGS Non covered great vessel origins, which is a typical limitation by MRA. The right vertebral artery is dominant. Vessels are smooth and widely patent. No evidence of atheromatous disease. IMPRESSION: Brain MRI: No emergent finding or change from 2011. Intracranial MRA: 1. No emergent finding or flow limiting stenosis. There is limitation due to motion artifact. 2. 2 mm bulbous area in the anterior communicating artery region, consider 1 year follow-up MRA. Neck MRA: Negative. Electronically Signed   By: Monte Fantasia M.D.   On: 06/25/2019 04:37   Mr Angio Neck Wo Contrast  Result Date:  06/25/2019 CLINICAL DATA:  Malignant hypertension with altered mental status EXAM: MRI HEAD WITHOUT CONTRAST MRA HEAD WITHOUT CONTRAST MRA NECK WITHOUT CONTRAST TECHNIQUE: Multiplanar, multiecho pulse sequences of the brain and surrounding structures were obtained without intravenous contrast. Angiographic images of the Circle of Willis were obtained using MRA technique without intravenous contrast. Angiographic images of the neck were obtained using MRA technique without intravenous contrast. Carotid stenosis measurements (when applicable) are obtained utilizing NASCET criteria, using the distal internal carotid diameter as the denominator. COMPARISON:  Head CT earlier today.  Brain MRI 08/09/2010 FINDINGS: MRI HEAD FINDINGS Brain: No acute infarction, hemorrhage, hydrocephalus, extra-axial collection or mass lesion. Very mild patchy FLAIR hyperintensity in the cerebral white matter from remote  nonspecific insult without significant change since 2011. History suggests microvascular ischemia in the setting of hypertension. There is also history of migraines. Vascular: Arterial findings below.  Normal flow voids. Skull and upper cervical spine: Negative for marrow lesion. 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 MRA HEAD FINDINGS Generalized mild motion artifact. Right dominant vertebral artery with only a tiny left vertebral continue into the basilar. Vertebral, basilar, and carotid vessels are smooth and diffusely patent. Apparent atheromatous irregularity of medium size MCA branches may be from artifact. 2 mm anterior bulge in the anterior communicating artery region. MRA NECK FINDINGS Non covered great vessel origins, which is a typical limitation by MRA. The right vertebral artery is dominant. Vessels are smooth and widely patent. No evidence of atheromatous disease. IMPRESSION: Brain MRI: No emergent finding or change from 2011. Intracranial MRA: 1. No emergent finding  or flow limiting stenosis. There is limitation due to motion artifact. 2. 2 mm bulbous area in the anterior communicating artery region, consider 1 year follow-up MRA. Neck MRA: Negative. Electronically Signed   By: Monte Fantasia M.D.   On: 06/25/2019 04:37   Mr Brain Wo Contrast  Result Date: 06/25/2019 CLINICAL DATA:  Malignant hypertension with altered mental status EXAM: MRI HEAD WITHOUT CONTRAST MRA HEAD WITHOUT CONTRAST MRA NECK WITHOUT CONTRAST TECHNIQUE: Multiplanar, multiecho pulse sequences of the brain and surrounding structures were obtained without intravenous contrast. Angiographic images of the Circle of Willis were obtained using MRA technique without intravenous contrast. Angiographic images of the neck were obtained using MRA technique without intravenous contrast. Carotid stenosis measurements (when applicable) are obtained utilizing NASCET criteria, using the distal internal carotid diameter as the denominator. COMPARISON:  Head CT earlier today.  Brain MRI 08/09/2010 FINDINGS: MRI HEAD FINDINGS Brain: No acute infarction, hemorrhage, hydrocephalus, extra-axial collection or mass lesion. Very mild patchy FLAIR hyperintensity in the cerebral white matter from remote nonspecific insult without significant change since 2011. History suggests microvascular ischemia in the setting of hypertension. There is also history of migraines. Vascular: Arterial findings below.  Normal flow voids. Skull and upper cervical spine: Negative for marrow lesion. 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 MRA HEAD FINDINGS Generalized mild motion artifact. Right dominant vertebral artery with only a tiny left vertebral continue into the basilar. Vertebral, basilar, and carotid vessels are smooth and diffusely patent. Apparent atheromatous irregularity of medium size MCA branches may be from artifact. 2 mm anterior bulge in the anterior communicating artery region. MRA NECK  FINDINGS Non covered great vessel origins, which is a typical limitation by MRA. The right vertebral artery is dominant. Vessels are smooth and widely patent. No evidence of atheromatous disease. IMPRESSION: Brain MRI: No emergent finding or change from 2011. Intracranial MRA: 1. No emergent finding or flow limiting stenosis. There is limitation due to motion artifact. 2. 2 mm bulbous area in the anterior communicating artery region, consider 1 year follow-up MRA. Neck MRA: Negative. Electronically Signed   By: Monte Fantasia M.D.   On: 06/25/2019 04:37   Dg Chest Port 1 View  Result Date: 06/24/2019 CLINICAL DATA:  Headaches with vomiting since this morning. EXAM: PORTABLE CHEST 1 VIEW COMPARISON:  Radiographs 01/19/2019 and 08/31/2016. FINDINGS: 1812 hours. The left costophrenic angle is partially excluded. The heart size and mediastinal contours are normal. The lungs are clear. There is no pleural effusion or pneumothorax. No acute osseous findings are identified. Telemetry leads overlie the chest. IMPRESSION: No evidence of active cardiopulmonary process.  Electronically Signed   By: Richardean Sale M.D.   On: 06/24/2019 19:00    Cardiac Studies   none  Patient Profile     48 y.o. male admitted with HA, abdominal pain, protracted N/V and severely elevated BP. Asked to see for evaluation of tachycardia  Assessment & Plan    Impression: 1. Tachycardia - this appears to be sinus tachy with PACs. This may be related to withdrawal of clonidine and/or reflex tachycardia with Icleviprex. HR is now normal. No further evaluation is needed.. 2. Severe HTN.  Once able to take po well can re-institute home BP regimen but with consideration of adding a beta blocker such as labetalol as this could potentially help reduce frequency and severity of migraine.  3. Acute on chronic renal failure secondary to dehydration with N/V as well as hypertensive emergency.   [facility] will sign off.   Medication  Recommendations:  When able to take po resume oral clonidine, amlodipine and consider starting oral beta blocker Other recommendations (labs, testing, etc):  None Follow up as an outpatient:  With Dr Bronson Ing in 3-4 weeks.  For questions or updates, please contact [place] Please consult [email or web address] for contact info under        Signed, Tameyah Koch [place], MD  06/26/2019, 7:27 AM

## 2019-06-27 ENCOUNTER — Inpatient Hospital Stay (HOSPITAL_COMMUNITY): Payer: BC Managed Care – PPO

## 2019-06-27 DIAGNOSIS — I10 Essential (primary) hypertension: Secondary | ICD-10-CM

## 2019-06-27 LAB — CBC
HCT: 28.9 % — ABNORMAL LOW (ref 39.0–52.0)
Hemoglobin: 9.6 g/dL — ABNORMAL LOW (ref 13.0–17.0)
MCH: 32.9 pg (ref 26.0–34.0)
MCHC: 33.2 g/dL (ref 30.0–36.0)
MCV: 99 fL (ref 80.0–100.0)
Platelets: 194 10*3/uL (ref 150–400)
RBC: 2.92 MIL/uL — ABNORMAL LOW (ref 4.22–5.81)
RDW: 11.4 % — ABNORMAL LOW (ref 11.5–15.5)
WBC: 8 10*3/uL (ref 4.0–10.5)
nRBC: 0 % (ref 0.0–0.2)

## 2019-06-27 LAB — PROCALCITONIN: Procalcitonin: <0.1 ng/mL

## 2019-06-27 LAB — COMPREHENSIVE METABOLIC PANEL
ALT: 10 U/L (ref 0–44)
AST: 14 U/L — ABNORMAL LOW (ref 15–41)
Albumin: 3.3 g/dL — ABNORMAL LOW (ref 3.5–5.0)
Alkaline Phosphatase: 43 U/L (ref 38–126)
Anion gap: 7 (ref 5–15)
BUN: 30 mg/dL — ABNORMAL HIGH (ref 6–20)
CO2: 23 mmol/L (ref 22–32)
Calcium: 8.2 mg/dL — ABNORMAL LOW (ref 8.9–10.3)
Chloride: 108 mmol/L (ref 98–111)
Creatinine, Ser: 3.53 mg/dL — ABNORMAL HIGH (ref 0.61–1.24)
GFR calc Af Amer: 22 mL/min — ABNORMAL LOW (ref >60)
GFR calc non Af Amer: 19 mL/min — ABNORMAL LOW (ref >60)
Glucose, Bld: 122 mg/dL — ABNORMAL HIGH (ref 70–99)
Potassium: 3.7 mmol/L (ref 3.5–5.1)
Sodium: 138 mmol/L (ref 135–145)
Total Bilirubin: 1 mg/dL (ref 0.3–1.2)
Total Protein: 5.3 g/dL — ABNORMAL LOW (ref 6.5–8.1)

## 2019-06-27 LAB — PHOSPHORUS: Phosphorus: 3.4 mg/dL (ref 2.5–4.6)

## 2019-06-27 LAB — LACTATE DEHYDROGENASE: LDH: 122 U/L (ref 98–192)

## 2019-06-27 LAB — MAGNESIUM: Magnesium: 1.9 mg/dL (ref 1.7–2.4)

## 2019-06-27 LAB — CK: Total CK: 121 U/L (ref 49–397)

## 2019-06-27 MED ORDER — LABETALOL HCL 5 MG/ML IV SOLN
0.0000 mg/min | INTRAVENOUS | Status: DC
  Filled 2019-06-27: qty 200

## 2019-06-27 MED ORDER — TRAMADOL HCL 50 MG PO TABS
50.0000 mg | ORAL_TABLET | Freq: Once | ORAL | Status: AC
  Administered 2019-06-27: 50 mg via ORAL
  Filled 2019-06-27: qty 1

## 2019-06-27 MED ORDER — ONDANSETRON HCL 4 MG PO TABS
8.0000 mg | ORAL_TABLET | Freq: Three times a day (TID) | ORAL | Status: DC | PRN
  Filled 2019-06-27: qty 2

## 2019-06-27 MED ORDER — SODIUM CHLORIDE 0.9 % IV SOLN: 2.0000 g | INTRAVENOUS | Status: DC

## 2019-06-27 MED ORDER — OXYCODONE HCL 5 MG PO TABS
5.0000 mg | ORAL_TABLET | Freq: Three times a day (TID) | ORAL | Status: DC | PRN
  Administered 2019-06-27 – 2019-06-29 (×4): 5 mg via ORAL
  Filled 2019-06-27 (×5): qty 1

## 2019-06-27 MED ORDER — LABETALOL HCL 5 MG/ML IV SOLN: 20.0000 mg | INTRAVENOUS | Status: DC | PRN

## 2019-06-27 MED ORDER — SUMATRIPTAN SUCCINATE 100 MG PO TABS
100.0000 mg | ORAL_TABLET | Freq: Once | ORAL | Status: AC
  Administered 2019-06-27: 100 mg via ORAL
  Filled 2019-06-27 (×2): qty 1

## 2019-06-27 MED ORDER — METOPROLOL TARTRATE 12.5 MG HALF TABLET
25.0000 mg | ORAL_TABLET | Freq: Two times a day (BID) | ORAL | Status: DC
  Administered 2019-06-27 – 2019-06-29 (×5): 25 mg via ORAL
  Filled 2019-06-27 (×5): qty 2

## 2019-06-27 MED ORDER — ACETAMINOPHEN 500 MG PO TABS
1000.0000 mg | ORAL_TABLET | Freq: Three times a day (TID) | ORAL | Status: DC
  Administered 2019-06-27 – 2019-06-29 (×6): 1000 mg via ORAL
  Filled 2019-06-27 (×7): qty 2

## 2019-06-27 NOTE — Progress Notes (Signed)
  PM rounds   doing well Family at bedside Had ultram for headache SBP < 150 and off labetalol gtt Seen by renal - making urine - sbp control advised  Plan Move to med tele D/w Dr Wyline Copas - [facility] primary from 06/28/19 and ccm off      SIGNATURE    Dr. Brand Males, M.D., F.C.C.P,  Pulmonary and Critical Care Medicine Staff Physician, [place] Director - Interstitial Lung Disease  Program  [facility] [place], [place], [address]  Pager: [phone], If no answer or between  15:00h - 7:00h: call [phone] Telephone: [phone]  5:26 PM 06/27/2019

## 2019-06-27 NOTE — Progress Notes (Signed)
ATTESTATION & SIGNATURE   STAFF NOTE: I, Dr Ann Lions have personally reviewed patient's available data, including medical history, events of note, physical examination and test results as part of my evaluation. I have discussed with resident/NP and other care providers such as pharmacist, RN and RRT.  In addition,  I personally evaluated patient and elicited key findings of   S: Date of admit 06/24/2019 with LOS 2 for today 06/27/2019 : [name] is   -> has baseline creat of 2.5 to 2.8 . Current 3.53 and rising. Mentally intact. On labtealol gtt and coming down on 1 mg. Oral agents: norvasc and lopressor and patch clonidine. Resident feels patch better suited for him because of nausea at home  SBP - 150s (goal < 160). Per patient he runs 180 sbp and 95-100 diast at home. HR currently 59    has a past medical history of Chronic back pain, Duodenitis with bleeding (08/2008), Essential hypertension, benign, GERD (gastroesophageal reflux disease), Headache(784.0), Hyperlipidemia, Migraines, Obesity, unspecified, and Sleep apnea.  has a past surgical history that includes Foot fracture surgery (Right); Finger debridement (Left, 09/25/2013); Amputation (Left, 09/26/2013); and Cholecystectomy (N/A, 10/03/2018).   O:  Blood pressure 137/79, pulse 60, temperature 98.6 F (37 C), temperature source Oral, resp. rate 12, height 5\' 9"  (1.753 m), weight 88.6 kg, SpO2 97 %.   Axo3 Pleasant CTA bilaterally Abd soft   LABS    PULMONARY Recent Labs  Lab 06/26/19 0425  PHART 7.398  PCO2ART 36.6  PO2ART 81.0*  HCO3 22.6  TCO2 24  O2SAT 96.0    CBC Recent Labs  Lab 06/25/19 0956 06/26/19 0248 06/26/19 0425 06/27/19 0227  HGB 12.9* 10.4* 10.2* 9.6*  HCT 37.9* 30.5* 30.0* 28.9*  WBC 10.2 8.6  --  8.0  PLT 287 216  --  194    COAGULATION Recent Labs  Lab 06/25/19 0556 06/25/19 0956  INR 1.1 1.1    CARDIAC  No results for input(s): TROPONINI in the last 168 hours. No  results for input(s): PROBNP in the last 168 hours.   CHEMISTRY Recent Labs  Lab 06/24/19 1808 06/25/19 0956 06/26/19 0248 06/26/19 0425 06/27/19 0227  NA 143 141 141 142 138  K 3.4* 2.9* 3.8 3.9 3.7  CL 108 104 107  --  108  CO2 23 23 22   --  23  GLUCOSE 121* 115* 118*  --  122*  BUN 24* 18 27*  --  30*  CREATININE 2.83* 2.53* 3.33*  --  3.53*  CALCIUM 8.9 8.8* 8.3*  --  8.2*  MG  --  1.6* 1.7  --  1.9  PHOS  --  2.0*  --   --  3.4   Estimated Creatinine Clearance: 27.6 mL/min (A) (by C-G formula based on SCr of 3.53 mg/dL (H)).   LIVER Recent Labs  Lab 06/24/19 1808 06/25/19 0556 06/25/19 0956 06/27/19 0227  AST 25  --  23 14*  ALT 14  --  13 10  ALKPHOS 59  --  65 43  BILITOT 1.1  --  1.6* 1.0  PROT 7.4  --  7.6 5.3*  ALBUMIN 4.7  --  4.9 3.3*  INR  --  1.1 1.1  --      INFECTIOUS Recent Labs  Lab 06/25/19 0437 06/25/19 0556 06/25/19 0956 06/26/19 0248 06/27/19 0227  LATICACIDVEN 2.7* 1.8 1.8  --   --   PROCALCITON  --   --  <0.10 0.17 <0.10  ENDOCRINE CBG (last 3)  Recent Labs    06/25/19 1942 06/25/19 2358 06/26/19 0402  GLUCAP 117* 105* 103*         IMAGING x24h  - image(s) personally visualized  -   highlighted in bold No results found.    A: Empiric Abx -> PCT < 0.1 Severe hyprtension  - some better AKI on CKD -> creat worse  P: dc abx Wean off labetalol -> increase lopressor + continue oral agents: sbp < 160 Renal consult  Evaluate for transfer out later in the evening 06/27/2019 depending on course   Anti-infectives (From admission, onward)   Start     Dose/Rate Route Frequency Ordered Stop   06/28/19 0427  ceFEPIme (MAXIPIME) 2 g in sodium chloride 0.9 % 100 mL IVPB     2 g 200 mL/hr over 30 Minutes Intravenous Every 24 hours 06/27/19 0710     06/26/19 0700  vancomycin (VANCOCIN) IVPB 750 mg/150 ml premix  Status:  Discontinued     750 mg 150 mL/hr over 60 Minutes Intravenous Every 24 hours 06/25/19 0609  06/26/19 1109   06/25/19 1800  ceFEPIme (MAXIPIME) 2 g in sodium chloride 0.9 % 100 mL IVPB  Status:  Discontinued     2 g 200 mL/hr over 30 Minutes Intravenous Every 12 hours 06/25/19 0609 06/27/19 0710   06/25/19 0615  vancomycin (VANCOCIN) 2,000 mg in sodium chloride 0.9 % 500 mL IVPB     2,000 mg 250 mL/hr over 120 Minutes Intravenous  Once 06/25/19 0600 06/25/19 0958   06/25/19 0600  ceFEPIme (MAXIPIME) 2 g in sodium chloride 0.9 % 100 mL IVPB     2 g 200 mL/hr over 30 Minutes Intravenous  Once 06/25/19 0557 06/25/19 0721   06/25/19 0600  metroNIDAZOLE (FLAGYL) IVPB 500 mg  Status:  Discontinued     500 mg 100 mL/hr over 60 Minutes Intravenous  Once 06/25/19 0557 06/25/19 0903   06/25/19 0600  vancomycin (VANCOCIN) IVPB 1000 mg/200 mL premix  Status:  Discontinued     1,000 mg 200 mL/hr over 60 Minutes Intravenous  Once 06/25/19 0557 06/25/19 0600        The patient is critically ill with multiple organ systems failure and requires high complexity decision making for assessment and support, frequent evaluation and titration of therapies, application of advanced monitoring technologies and extensive interpretation of multiple databases.   Critical Care Time devoted to patient care services described in this note is  30  Minutes. This time reflects time of care of this signee Dr Brand Males. This critical care time does not reflect procedure time, or teaching time or supervisory time of PA/NP/Med student/Med Resident etc but could involve care discussion time     Dr. Brand Males, M.D., [facility].C.P Pulmonary and Critical Care Medicine Staff Physician [facility] Pager: [phone], If no answer or between  15:00h - 7:00h: call [phone]  06/27/2019 9:24 AM

## 2019-06-27 NOTE — Progress Notes (Signed)
NAME:  [name], MRN:  [id number], DOB:  1971-01-12, LOS: 2 ADMISSION DATE:  06/24/2019,  REFERRING MD: Emergency department physician, CHIEF COMPLAINT: Hypertension headache nausea vomiting  BRIEF HISTORY:    48 year old with a history of hypertensive emergency, CKDIV, who presents with nausea vomiting along with migraine headaches transferred from [facility] to come for further evaluation and treatment  HISTORY OF PRESENT ILLNESS   48 year old male presented with 24 hours nausea vomiting and inability to take p.o. medication.  He was transferred to [facility] 06/25/2019 for MRI evaluation which was essentially negative except for bulging with anterior communicating artery which needs to be evaluated in 1 year approximately.  Noted to have an elevated lactic acid of 2.7 and mildly febrile therefore was started on sepsis protocol bolus with 2 L of fluid given cefepime, vancomycin, and Flagyl x1.  BP elevated and started on transdermal and IV antihypertensives and continue the Cleviprex until systolic blood pressure is better controlled.  Once his nausea vomiting is resolved he can be restarted on p.o. antihypertensives.  SIGNIFICANT PAST MEDICAL HISTORY   Hypertensive emergency Chronic migraine headaches CKDIV History of tobacco abuse Lap Cholecystectomy 09/23/2018  SIGNIFICANT EVENTS:  06/24/2019 transferred from [facility] for MRI which was negative for acute pathology  STUDIES:   06/25/2019 MRI brain which revealed bulging anterior communicating artery without acute process. Negative for acute pathology  CULTURES:  06/25/2019 blood cultures x2>>NGTD  ANTIBIOTICS:  06/25/2019 vancomycin>>10/7 06/25/2019 cefepime>>10/8 06/25/2019 Flagyl 1x   LINES/TUBES:   CONSULTANTS:  PCCM nephrology  SUBJECTIVE:  Patient headache improved. Nausea resolved.   CONSTITUTIONAL: BP 137/79    Pulse 60    Temp 98.6 F (37 C) (Oral)    Resp 12    Ht  5\' 9"  (1.753 m)    Wt 88.6 kg    SpO2 97%    BMI 28.84 kg/m   I/O last 3 completed shifts: In: 4895.6 [P.O.:960; I.V.:2809.5; IV Piggyback:1126.1] Out: 680 [Urine:680]     PHYSICAL EXAM: General: Middle aged adult male lying in bed in NAD HEENT: MM pink/moist  Neuro: Alert and oriented x3, no focal neuro deficits, able to follow all commands, PERRL  CV: s1s2 regular rate and rhythm, no murmur, rubs, or gallops,  PULM:  Clear to ascultation bilaterally   GI: soft, bowel sounds active in all 4 quadrants, non-tender, non-distended Extremities: warm/dry, no edema  Skin: no rashes or lesions  RESOLVED PROBLEM LIST    ASSESSMENT AND PLAN   48yo male presents from OSH for MRI evaluation of hypertensive emergency.   Hypertension- improved. BP 123456 systolics, [id number] diastolics.  (on initial presentation to ED, patient had AMS indicating possible end-organ damage from HTN emergency but with negative brain MRI, also with SOB but negative chest xray.) BP recorded as high as 187/104. -Labetalol drip wean, SBP goal of less than 0000000 systolic - continue clonidine patch, amlodipine - increase metoprolol 25mg  BID - call renal for worsening renal function  Nausea and vomiting- resolved - switch zofran to PO PRN today  Retractable headaches- chronic and improved from admission - scheduled tylenol - PRN oxy 5mg   Sepsis rule out. Work up unremarkable. Patient improving. Will discontinue antibiotics at this time and continue to monitor. Fever and AMS likely related to BP. Procalcitonin 0.17 - monitor fever curve - follow cultures  Acute kidney injury superimposed on Chronic kidney disease stage 3/4 Baseline creatinine appears to be between 2.3-2.7 with GFR 25-30. Would expect  kidney function to improve with discontinuing of antibiotics. Slowly bringing down BP to avoid hypoperfusion injury. Urine studies pending. Cr increased today 3.53 from 3.33. only 430cc UOP yesterday - avoid nephrotoxic  agents. - strict I/Os - ordered renal US - consult renal for worsening kidney functioning, appreciate recs. Potassium is wnl.   Obstructive sleep apnea  CPAP qHS  Best Practice / Goals of Care / Disposition.   DVT PROPHYLAXIS: lovenox SUP:PPI NUTRITION: regular diet MOBILITY:bedrest GOALS OF CARE:Full code FAMILY DISCUSSIONS: Pt updated at bedside DISPOSITION ICU admit  LABS  Glucose Recent Labs  Lab 06/25/19 0837 06/25/19 1156 06/25/19 1601 06/25/19 1942 06/25/19 2358 06/26/19 0402  GLUCAP 113* 113* 102* 117* 105* 103*    BMET Recent Labs  Lab 06/25/19 0956 06/26/19 0248 06/26/19 0425 06/27/19 0227  NA 141 141 142 138  K 2.9* 3.8 3.9 3.7  CL 104 107  --  108  CO2 23 22  --  23  BUN 18 27*  --  30*  CREATININE 2.53* 3.33*  --  3.53*  GLUCOSE 115* 118*  --  122*    Liver Enzymes Recent Labs  Lab 06/24/19 1808 06/25/19 0956 06/27/19 0227  AST 25 23 14*  ALT 14 13 10   ALKPHOS 59 65 43  BILITOT 1.1 1.6* 1.0  ALBUMIN 4.7 4.9 3.3*    Electrolytes Recent Labs  Lab 06/25/19 0956 06/26/19 0248 06/27/19 0227  CALCIUM 8.8* 8.3* 8.2*  MG 1.6* 1.7 1.9  PHOS 2.0*  --  3.4    CBC Recent Labs  Lab 06/25/19 0956 06/26/19 0248 06/26/19 0425 06/27/19 0227  WBC 10.2 8.6  --  8.0  HGB 12.9* 10.4* 10.2* 9.6*  HCT 37.9* 30.5* 30.0* 28.9*  PLT 287 216  --  194    ABG Recent Labs  Lab 06/26/19 0425  PHART 7.398  PCO2ART 36.6  PO2ART 81.0*    Coag's Recent Labs  Lab 06/25/19 0556 06/25/19 0956  APTT 28 30  INR 1.1 1.1    Sepsis Markers Recent Labs  Lab 06/25/19 0437 06/25/19 0556 06/25/19 0956 06/26/19 0248 06/27/19 0227  LATICACIDVEN 2.7* 1.8 1.8  --   --   PROCALCITON  --   --  <0.10 0.17 <0.10   CRITICAL CARE Performed by: Mariea Clonts, DO Family Medicine PGY-2

## 2019-06-27 NOTE — Consult Note (Signed)
Reason for Consult: Renal failure Referring Physician:  Dr. Chase Caller  Chief Complaint:  Nausea and vomiting  Assessment/Plan: 1. 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 Carson Myrtle.  - Acute component most likely due to hypertensive emergency initiated by inability to consume oral anti-hypertensives exacerbated by Clonidine withdrawal and relative hypotension in the setting of hypertensive crisis + Vancomycin exposure. No contrast administration. - RAS and MAHA less likely with a normal bilirubin but will check a LDH and Haptoglobin.  - He has poor renal reserve but fortunately does not have any absolute or relative indications indications for dialysis. No e/o obstruction on renal u/s but w/ incr echogenicity. - Already restarted on anti-hypertensives + BBlr and conversion to TTS3 patch. - UOP starting to increase which is a good sign and we will follow closely with you. - Will need f/u with renal within 2 weeks @ d/c to ensure that the renal function is stabilizing. 2. HTN - much better controlled. 3. Migraines - better and able to tolerate PO's; actually asking when he can go home.    HPI: [name] is an 48 y.o. male HTN, , duodenal bleeding, GERD, migraines, OSA, CKDIV with a history of hypertensive emergency episodes in the past who presented to [facility] on 06/24/2019 with nausea and vomiting + headache. He was started on Cleviprex gtt for hypertensive emergency and transferred to [facility] for further evaluation. He was treated with broadspectrum Abx and bolused 2 L of NS. He received Cefepime, Vanc and Flagyl. He was noted to be in sinus tachy thought by cardiology to be withdrawal from clonidine or reflex tachy from the Cleviprex. Later changed from Cleviprex to Labetolol gtt. He was also restarted on orla medications with the addition of Metoprolol w/ TTS-3 patch. BP on presentation was [id number] systolic and has had drops to as low as  [id number] systolic. Creatinine at presentation was 2.83; his baseline Cr is ~2.8-3 and he's been classified as CKDIV followed by Dr. Carson Myrtle. He denies any NSAID's, photophobia, joint pain, rashes, hematuria or obstructive symptoms.   ROS Pertinent items are noted in HPI.  Chemistry and CBC: Creat  Date/Time Value Ref Range Status  11/08/2018 10:17 AM 2.83 (H) 0.70 - 1.33 mg/dL Final    Comment:    For patients >52 years of age, the reference limit for Creatinine is approximately 13% higher for people identified as African-American. Marland Kitchen   11/10/2017 01:37 PM 2.66 (H) 0.60 - 1.35 mg/dL Final  12/28/2016 08:35 AM 2.27 (H) 0.60 - 1.35 mg/dL Final  10/26/2016 11:23 AM 2.61 (H) 0.60 - 1.35 mg/dL Final  08/30/2016 02:48 PM 2.34 (H) 0.60 - 1.35 mg/dL Final  05/16/2016 10:09 AM 2.32 (H) 0.60 - 1.35 mg/dL Final  09/22/2015 08:14 AM 1.90 (H) 0.60 - 1.35 mg/dL Final  04/10/2015 10:42 AM 1.95 (H) 0.50 - 1.35 mg/dL Final  09/30/2014 10:30 AM 2.00 (H) 0.50 - 1.35 mg/dL Final  02/19/2014 12:32 PM 1.90 (H) 0.50 - 1.35 mg/dL Final  10/01/2013 09:49 AM 1.60 (H) 0.50 - 1.35 mg/dL Final  03/29/2012 10:42 AM 1.77 (H) 0.50 - 1.35 mg/dL Final  04/07/2011 12:01 PM 1.85 (H) 0.50 - 1.35 mg/dL Final   Creatinine, Ser  Date/Time Value Ref Range Status  06/27/2019 02:27 AM 3.53 (H) 0.61 - 1.24 mg/dL Final  06/26/2019 02:48 AM 3.33 (H) 0.61 - 1.24 mg/dL Final  06/25/2019 09:56 AM 2.53 (H) 0.61 - 1.24 mg/dL Final  06/24/2019 06:08  PM 2.83 (H) 0.61 - 1.24 mg/dL Final  01/19/2019 08:37 AM 3.15 (H) 0.61 - 1.24 mg/dL Final  10/02/2018 02:06 PM 2.82 (H) 0.61 - 1.24 mg/dL Final  06/26/2018 10:47 AM 2.73 (H) 0.61 - 1.24 mg/dL Final  04/05/2018 08:51 AM 2.84 (H) 0.61 - 1.24 mg/dL Final  04/03/2018 05:47 PM 2.77 (H) 0.61 - 1.24 mg/dL Final  01/07/2018 06:04 AM 3.13 (H) 0.61 - 1.24 mg/dL Final  01/06/2018 10:27 AM 2.52 (H) 0.61 - 1.24 mg/dL Final  10/23/2017 10:47 AM 2.50 (H) 0.61 - 1.24 mg/dL Final  07/23/2017 12:56 PM  2.95 (H) 0.61 - 1.24 mg/dL Final  05/26/2017 05:36 AM 2.37 (H) 0.61 - 1.24 mg/dL Final  05/25/2017 05:36 AM 2.30 (H) 0.61 - 1.24 mg/dL Final  05/24/2017 10:21 AM 2.46 (H) 0.61 - 1.24 mg/dL Final  05/21/2017 11:49 AM 2.86 (H) 0.61 - 1.24 mg/dL Final  08/31/2016 04:19 PM 2.24 (H) 0.61 - 1.24 mg/dL Final  10/13/2015 09:54 AM 1.72 (H) 0.61 - 1.24 mg/dL Final  06/14/2014 04:20 AM 2.09 (H) 0.50 - 1.35 mg/dL Final  08/03/2010 10:29 PM 1.87 (H) 0.40 - 1.50 mg/dL Final  08/31/2008 08:45 PM 1.48 0.4 - 1.5 mg/dL Final  06/20/2008 09:09 AM 1.82 (H)  Final   Recent Labs  Lab 06/24/19 1808 06/25/19 0956 06/26/19 0248 06/26/19 0425 06/27/19 0227  NA 143 141 141 142 138  K 3.4* 2.9* 3.8 3.9 3.7  CL 108 104 107  --  108  CO2 23 23 22   --  23  GLUCOSE 121* 115* 118*  --  122*  BUN 24* 18 27*  --  30*  CREATININE 2.83* 2.53* 3.33*  --  3.53*  CALCIUM 8.9 8.8* 8.3*  --  8.2*  PHOS  --  2.0*  --   --  3.4   Recent Labs  Lab 06/25/19 0556 06/25/19 0956 06/26/19 0248 06/26/19 0425 06/27/19 0227  WBC 9.1 10.2 8.6  --  8.0  NEUTROABS 8.3*  --   --   --   --   HGB 14.1 12.9* 10.4* 10.2* 9.6*  HCT 39.9 37.9* 30.5* 30.0* 28.9*  MCV 94.5 95.7 96.8  --  99.0  PLT 293 287 216  --  194   Liver Function Tests: Recent Labs  Lab 06/24/19 1808 06/25/19 0956 06/27/19 0227  AST 25 23 14*  ALT 14 13 10   ALKPHOS 59 65 43  BILITOT 1.1 1.6* 1.0  PROT 7.4 7.6 5.3*  ALBUMIN 4.7 4.9 3.3*   Recent Labs  Lab 06/25/19 0956  LIPASE 39  AMYLASE 112*   No results for input(s): AMMONIA in the last 168 hours. Cardiac Enzymes: No results for input(s): CKTOTAL, CKMB, CKMBINDEX, TROPONINI in the last 168 hours. Iron Studies: No results for input(s): IRON, TIBC, TRANSFERRIN, FERRITIN in the last 72 hours. PT/INR: @LABRCNTIP (inr:5)  Xrays/Other Studies: ) Results for orders placed or performed during the hospital encounter of 06/24/19 (from the past 48 hour(s))  Glucose, capillary     Status: Abnormal    Collection Time: 06/25/19  4:01 PM  Result Value Ref Range   Glucose-Capillary 102 (H) 70 - 99 mg/dL  Glucose, capillary     Status: Abnormal   Collection Time: 06/25/19  7:42 PM  Result Value Ref Range   Glucose-Capillary 117 (H) 70 - 99 mg/dL   Comment 1 Notify RN   Glucose, capillary     Status: Abnormal   Collection Time: 06/25/19 11:58 PM  Result Value Ref Range  Glucose-Capillary 105 (H) 70 - 99 mg/dL  CBC     Status: Abnormal   Collection Time: 06/26/19  2:48 AM  Result Value Ref Range   WBC 8.6 4.0 - 10.5 K/uL   RBC 3.15 (L) 4.22 - 5.81 MIL/uL   Hemoglobin 10.4 (L) 13.0 - 17.0 g/dL   HCT 30.5 (L) 39.0 - 52.0 %   MCV 96.8 80.0 - 100.0 fL   MCH 33.0 26.0 - 34.0 pg   MCHC 34.1 30.0 - 36.0 g/dL   RDW 11.8 11.5 - 15.5 %   Platelets 216 150 - 400 K/uL   nRBC 0.0 0.0 - 0.2 %    Comment: Performed at [facility], [place] [address] [place], [place] [id number]  Basic metabolic panel     Status: Abnormal   Collection Time: 06/26/19  2:48 AM  Result Value Ref Range   Sodium 141 135 - 145 mmol/L   Potassium 3.8 3.5 - 5.1 mmol/L    Comment: DELTA CHECK NOTED   Chloride 107 98 - 111 mmol/L   CO2 22 22 - 32 mmol/L   Glucose, Bld 118 (H) 70 - 99 mg/dL   BUN 27 (H) 6 - 20 mg/dL   Creatinine, Ser 3.33 (H) 0.61 - 1.24 mg/dL   Calcium 8.3 (L) 8.9 - 10.3 mg/dL   GFR calc non Af Amer 20 (L) >60 mL/min   GFR calc Af Amer 24 (L) >60 mL/min   Anion gap 12 5 - 15    Comment: Performed at [facility], [place] [address], [place], [place] [address]  Procalcitonin     Status: None   Collection Time: 06/26/19  2:48 AM  Result Value Ref Range   Procalcitonin 0.17 ng/mL    Comment:        Interpretation: PCT (Procalcitonin) <= 0.5 ng/mL: Systemic infection (sepsis) is not likely. Local bacterial infection is possible. (NOTE)       Sepsis PCT Algorithm           Lower Respiratory Tract                                      Infection PCT Algorithm    ----------------------------      ----------------------------         PCT < 0.25 ng/mL                PCT < 0.10 ng/mL         Strongly encourage             Strongly discourage   discontinuation of antibiotics    initiation of antibiotics    ----------------------------     -----------------------------       PCT 0.25 - 0.50 ng/mL            PCT 0.10 - 0.25 ng/mL               OR       >80% decrease in PCT            Discourage initiation of                                            antibiotics      Encourage discontinuation  of antibiotics    ----------------------------     -----------------------------         PCT >= 0.50 ng/mL              PCT 0.26 - 0.50 ng/mL               AND        <80% decrease in PCT             Encourage initiation of                                             antibiotics       Encourage continuation           of antibiotics    ----------------------------     -----------------------------        PCT >= 0.50 ng/mL                  PCT > 0.50 ng/mL               AND         increase in PCT                  Strongly encourage                                      initiation of antibiotics    Strongly encourage escalation           of antibiotics                                     -----------------------------                                           PCT <= 0.25 ng/mL                                                 OR                                        > 80% decrease in PCT                                     Discontinue / Do not initiate                                             antibiotics Performed at [facility], [address], [place], [place] [address]   Magnesium     Status: None   Collection Time: 06/26/19  2:48 AM  Result Value Ref Range   Magnesium 1.7 1.7 - 2.4 mg/dL    Comment:  Performed at [facility], [place] [address], [place], [place] [address]  Glucose, capillary     Status: Abnormal   Collection Time: 06/26/19  4:02 AM  Result  Value Ref Range   Glucose-Capillary 103 (H) 70 - 99 mg/dL  I-STAT 7, (LYTES, BLD GAS, ICA, H+H)     Status: Abnormal   Collection Time: 06/26/19  4:25 AM  Result Value Ref Range   pH, Arterial 7.398 7.350 - 7.450   pCO2 arterial 36.6 32.0 - 48.0 mmHg   pO2, Arterial 81.0 (L) 83.0 - 108.0 mmHg   Bicarbonate 22.6 20.0 - 28.0 mmol/L   TCO2 24 22 - 32 mmol/L   O2 Saturation 96.0 %   Acid-base deficit 2.0 0.0 - 2.0 mmol/L   Sodium 142 135 - 145 mmol/L   Potassium 3.9 3.5 - 5.1 mmol/L   Calcium, Ion 1.16 1.15 - 1.40 mmol/L   HCT 30.0 (L) 39.0 - 52.0 %   Hemoglobin 10.2 (L) 13.0 - 17.0 g/dL   Patient temperature 98.4 F    Collection site RADIAL, ALLEN'S TEST ACCEPTABLE    Drawn by RT    Sample type ARTERIAL   MRSA PCR Screening     Status: None   Collection Time: 06/26/19  1:02 PM   Specimen: Nasopharyngeal  Result Value Ref Range   MRSA by PCR NEGATIVE NEGATIVE    Comment:        The GeneXpert MRSA Assay (FDA approved for NASAL specimens only), is one component of a comprehensive MRSA colonization surveillance program. It is not intended to diagnose MRSA infection nor to guide or monitor treatment for MRSA infections. Performed at [facility], [place] [address], [place], [place] [address]   Procalcitonin     Status: None   Collection Time: 06/27/19  2:27 AM  Result Value Ref Range   Procalcitonin <0.10 ng/mL    Comment:        Interpretation: PCT (Procalcitonin) <= 0.5 ng/mL: Systemic infection (sepsis) is not likely. Local bacterial infection is possible. (NOTE)       Sepsis PCT Algorithm           Lower Respiratory Tract                                      Infection PCT Algorithm    ----------------------------     ----------------------------         PCT < 0.25 ng/mL                PCT < 0.10 ng/mL         Strongly encourage             Strongly discourage   discontinuation of antibiotics    initiation of antibiotics    ----------------------------      -----------------------------       PCT 0.25 - 0.50 ng/mL            PCT 0.10 - 0.25 ng/mL               OR       >80% decrease in PCT            Discourage initiation of  antibiotics      Encourage discontinuation           of antibiotics    ----------------------------     -----------------------------         PCT >= 0.50 ng/mL              PCT 0.26 - 0.50 ng/mL               AND        <80% decrease in PCT             Encourage initiation of                                             antibiotics       Encourage continuation           of antibiotics    ----------------------------     -----------------------------        PCT >= 0.50 ng/mL                  PCT > 0.50 ng/mL               AND         increase in PCT                  Strongly encourage                                      initiation of antibiotics    Strongly encourage escalation           of antibiotics                                     -----------------------------                                           PCT <= 0.25 ng/mL                                                 OR                                        > 80% decrease in PCT                                     Discontinue / Do not initiate                                             antibiotics Performed at [facility], [place] [address], [place], [place] [address]   Comprehensive metabolic panel     Status: Abnormal   Collection Time: 06/27/19  2:27 AM  Result Value Ref Range   Sodium 138 135 - 145 mmol/L   Potassium 3.7 3.5 - 5.1 mmol/L   Chloride 108 98 - 111 mmol/L   CO2 23 22 - 32 mmol/L   Glucose, Bld 122 (H) 70 - 99 mg/dL   BUN 30 (H) 6 - 20 mg/dL   Creatinine, Ser 3.53 (H) 0.61 - 1.24 mg/dL   Calcium 8.2 (L) 8.9 - 10.3 mg/dL   Total Protein 5.3 (L) 6.5 - 8.1 g/dL   Albumin 3.3 (L) 3.5 - 5.0 g/dL   AST 14 (L) 15 - 41 U/L   ALT 10 0 - 44 U/L   Alkaline Phosphatase 43 38 - 126 U/L   Total  Bilirubin 1.0 0.3 - 1.2 mg/dL   GFR calc non Af Amer 19 (L) >60 mL/min   GFR calc Af Amer 22 (L) >60 mL/min   Anion gap 7 5 - 15    Comment: Performed at [facility], [place] [address] [place], [place] [address]  Magnesium     Status: None   Collection Time: 06/27/19  2:27 AM  Result Value Ref Range   Magnesium 1.9 1.7 - 2.4 mg/dL    Comment: Performed at [facility] [address], [place], [place] [address]  Phosphorus     Status: None   Collection Time: 06/27/19  2:27 AM  Result Value Ref Range   Phosphorus 3.4 2.5 - 4.6 mg/dL    Comment: Performed at [facility] [address] [place], [place] [address]  CBC     Status: Abnormal   Collection Time: 06/27/19  2:27 AM  Result Value Ref Range   WBC 8.0 4.0 - 10.5 K/uL   RBC 2.92 (L) 4.22 - 5.81 MIL/uL   Hemoglobin 9.6 (L) 13.0 - 17.0 g/dL   HCT 28.9 (L) 39.0 - 52.0 %   MCV 99.0 80.0 - 100.0 fL   MCH 32.9 26.0 - 34.0 pg   MCHC 33.2 30.0 - 36.0 g/dL   RDW 11.4 (L) 11.5 - 15.5 %   Platelets 194 150 - 400 K/uL   nRBC 0.0 0.0 - 0.2 %    Comment: Performed at [facility], [place] [address] [place], [place] [address]   US Renal  Result Date: 06/27/2019 CLINICAL DATA:  Kidney injury.  Hypertension. EXAM: RENAL / URINARY TRACT ULTRASOUND COMPLETE COMPARISON:  CT scan 04/04/2018 FINDINGS: Right Kidney: Renal measurements: 11.7 x 4.6 x 6.3 cm = volume: 177.2 mL. Increased echogenicity suggesting medical renal disease. Normal renal cortical thickness. Renal cysts are noted. The largest cyst is in the upper pole region measures 4.0 x 2.4 x 4.8 cm it is slightly complicated by septations but unchanged when compared to prior CT scan. No hydronephrosis. Left Kidney: Renal measurements: 10.7 x 6.0 x 5.7 cm = volume: 190.7 mL. Diffuse increased echogenicity consistent with medical renal disease. Normal renal cortical thickness. Small midpole renal cysts are noted. No worrisome lesions. Bladder: Appears normal for degree of  bladder distention. IMPRESSION: 1. Normal renal size and cortical thickness but diffuse increased echogenicity suggesting medical renal disease. 2. Bilateral renal cysts. 3. No hydronephrosis. 4. Normal bladder. Electronically Signed   By: Marijo Sanes M.D.   On: 06/27/2019 11:38    PMH:   Past Medical History:  Diagnosis Date  . Chronic back pain   . Duodenitis with bleeding 08/2008   Ulcer - admitted to [place]  hypertension, benign   . GERD (gastroesophageal reflux disease)   . Headache(784.0)   . Hyperlipidemia   . Migraines   . Obesity, unspecified   . Sleep apnea     PSH:   Past Surgical History:  Procedure Laterality Date  . 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 CHOLECYSTECTOMY N/A 10/03/2018   Procedure: LAPAROSCOPIC CHOLECYSTECTOMY;  Surgeon: Aviva Signs, MD;  Location: [facility];  Service: General;  Laterality: N/A;  . FINGER DEBRIDEMENT Left 09/25/2013   Thumb  . FOOT FRACTURE SURGERY Right     Allergies:  Allergies  Allergen Reactions  . Bee Venom Anaphylaxis  . Prochlorperazine Edisylate Other (See Comments)    Reaction: Jittery,uncomfortable feeling  . Cyclobenzaprine Hcl Other (See Comments)    REACTION: feel anxious, nausea  . Aleve [Naproxen Sodium] Nausea And Vomiting    States that this medication also causes abdominal pain  . Carbamazepine     REACTION: unknown reaction  . Geodon [Ziprasidone Hcl] Nausea And Vomiting  . Ibuprofen Nausea And Vomiting    States that this medication also causes abdominal pain  . Maxzide [Hydrochlorothiazide W-Triamterene]     headache  . Nsaids Other (See Comments)    H/o severe anemia from UGIB from ulcer  . Tylenol [Acetaminophen] Nausea And Vomiting    Patient states that this medication also causes abdominal pain  . Other  Nausea And Vomiting, Rash and Other (See Comments)    ALL MUSCLE RELAXANTS: states that they affect sciatic nerve, may cause nausea and vomiting, rash    Medications:   Prior to Admission medications   Medication Sig Start Date End Date Taking? Authorizing Provider  amLODipine (NORVASC) 10 MG tablet Take 1 tablet (10 mg total) by mouth daily. 10/16/18  Yes Fayrene Helper, MD  cloNIDine (CATAPRES) 0.3 MG tablet Take 1 tablet (0.3 mg total) by mouth 3 (three) times daily. 01/08/19  Yes Fayrene Helper, MD  esomeprazole (NEXIUM) 20 MG capsule Take 20 mg by mouth daily at 12 noon.   Yes [provider]  hydrALAZINE (APRESOLINE) 100 MG tablet Take 1 tablet (100 mg total) by mouth 3 (three) times daily. 10/16/18  Yes Fayrene Helper, MD  hydrOXYzine (VISTARIL) 50 MG capsule Take 50 mg by mouth 2 (two) times daily as needed for anxiety or itching.  12/03/18  Yes [provider]  ondansetron (ZOFRAN) 8 MG tablet Take 1 tablet (8 mg total) by mouth every 4 (four) hours as needed. Patient taking differently: Take 8 mg by mouth every 4 (four) hours as needed for nausea or vomiting.  01/19/19  Yes Nat Christen, MD    Discontinued Meds:   Medications Discontinued During This Encounter  Medication Reason  . vancomycin (VANCOCIN) IVPB 1000 mg/200 mL premix   . metroNIDAZOLE (FLAGYL) IVPB 500 mg Discontinued by provider  . clevidipine (CLEVIPREX) infusion 0.5 mg/mL   . vancomycin (VANCOCIN) IVPB 750 mg/150 ml premix   . hydrALAZINE (APRESOLINE) injection 10-40 mg   . pantoprazole (PROTONIX) injection 40 mg   . labetalol (NORMODYNE) 500 mg in dextrose 5 % 125 mL (4 mg/mL) infusion   . ceFEPIme (MAXIPIME) 2 g in sodium chloride 0.9 % 100 mL IVPB   . ceFEPIme (MAXIPIME) 2 g in sodium chloride 0.9 % 100 mL IVPB   . fentaNYL (SUBLIMAZE) injection 25-100  mcg   . ondansetron (ZOFRAN) injection 4 mg   . metoprolol tartrate (LOPRESSOR) tablet 12.5 mg   . labetalol (NORMODYNE) 1,000 mg  in dextrose 5 % 250 mL (4 mg/mL) infusion     Social History:  reports that he quit smoking about 6 years ago. His smoking use included cigarettes. He has a 1.50 pack-year smoking history. He has never used smokeless tobacco. He reports that he does not drink alcohol or use drugs.  Family History:   Family History  Problem Relation Age of Onset  . Diabetes Mother   . Hypertension Mother   . Hypertension Father   . Arthritis Father   . Diabetes Father     Blood pressure (!) 157/95, pulse 78, temperature 98.4 F (36.9 C), temperature source Oral, resp. rate 19, height 5\' 9"  (1.753 m), weight 88.6 kg, SpO2 97 %. General appearance: alert, cooperative and appears stated age Head: Normocephalic, without obvious abnormality, atraumatic Eyes: negative Neck: no adenopathy, no carotid bruit, no JVD, supple, symmetrical, trachea midline and thyroid not enlarged, symmetric, no tenderness/mass/nodules Back: symmetric, no curvature. ROM normal. No CVA tenderness. Resp: clear to auscultation bilaterally Chest wall: no tenderness Cardio: S1, S2 normal GI: soft, non-tender; bowel sounds normal; no masses,  no organomegaly Extremities: extremities normal, atraumatic, no cyanosis or edema Pulses: 2+ and symmetric Skin: Skin color, texture, turgor normal. No rashes or lesions       , Hunt Oris, MD 06/27/2019, 2:22 PM

## 2019-06-28 LAB — HAPTOGLOBIN: Haptoglobin: 153 mg/dL (ref 23–355)

## 2019-06-28 MED ORDER — MORPHINE SULFATE (PF) 2 MG/ML IV SOLN
2.0000 mg | INTRAVENOUS | Status: DC | PRN
  Administered 2019-06-28 (×2): 2 mg via INTRAVENOUS
  Administered 2019-06-28: 4 mg via INTRAVENOUS
  Administered 2019-06-28 – 2019-06-29 (×3): 2 mg via INTRAVENOUS
  Filled 2019-06-28: qty 1
  Filled 2019-06-28: qty 2
  Filled 2019-06-28 (×4): qty 1

## 2019-06-28 MED ORDER — ONDANSETRON HCL 4 MG/2ML IJ SOLN
INTRAMUSCULAR | Status: AC
  Administered 2019-06-28: 4 mg via INTRAVENOUS
  Filled 2019-06-28: qty 2

## 2019-06-28 MED ORDER — ONDANSETRON HCL 4 MG/2ML IJ SOLN
4.0000 mg | Freq: Three times a day (TID) | INTRAMUSCULAR | Status: DC | PRN
  Administered 2019-06-28 (×2): 4 mg via INTRAVENOUS

## 2019-06-28 NOTE — Progress Notes (Signed)
Patient called this RN into room due to nausea/upset stomach. Notified Elink for IV zofran as patient began actively vomiting. Patient unable to take PO meds at this time. Received order for alternative pain medication as all pain meds were PO at that time. PRN hydralazine given due to increased BP as well. Will continue to monitor.

## 2019-06-28 NOTE — Progress Notes (Signed)
PROGRESS NOTE    [name]  [id number] DOB: 1970/10/01 DOA: 06/24/2019 PCP: Fayrene Helper, MD   Brief Narrative:  Patient is a 48 year old male with history of chronic back pain, hypertension, GI bleed, GERD, hyperlipidemia, migraines, obesity, sleep apnea who was initially admitted with hypertensive urgency, nausea, vomiting at [facility].  He was transferred to [facility] for MRI of the brain which did not show any acute intracranial changes.  He was found to have elevated lactic acid, acute kidney injury presentation.  Patient was admitted under PCCM service for management of hypertensive emergency.  Nephrology following for acute kidney injury. Currently kidney function and blood pressure improving.  Patient transferred to [facility] service on 06/28/2019.  Assessment & Plan:   Active Problems:   Malignant hypertension   Hypertension   Atrial premature depolarization   Acute kidney injury ([place])   Sepsis due to undetermined organism [facility])  Hypertensive emergency: Blood pressure improving.  Medication adjusted.  Currently on amlodipine, clonidine patch,metoprolol.  AKI on CKD stage IV: Baseline kidney function at 2.8-3.  He follows with Dr. Lowanda Foster at [place].  Nephrology following.  Kidney function close to baseline now.  He is making good amount of urine.  He needs follow-up with nephrology on discharge.  Normocytic anemia: Most likely associated  with chronic kidney disease.  Currently CBC stable.  Continue to monitor.  We will follow-up iron studies.  Sepsis ruled out: Presented with elevated procalcitonin, lactic acidosis on presentation.  He was discharged on antibiotics initially but has been discontinued now.  Nutrition Problem: Inadequate oral intake Etiology: poor appetite      DVT prophylaxis: Lovenox Code Status: Full Family Communication: None present at the bedside Disposition Plan: Likely home tomorrow   Consultants: PCCM, nephrology   Procedures: None  Antimicrobials:  Anti-infectives (From admission, onward)   Start     Dose/Rate Route Frequency Ordered Stop   06/28/19 0427  ceFEPIme (MAXIPIME) 2 g in sodium chloride 0.9 % 100 mL IVPB  Status:  Discontinued     2 g 200 mL/hr over 30 Minutes Intravenous Every 24 hours 06/27/19 0710 06/27/19 0928   06/26/19 0700  vancomycin (VANCOCIN) IVPB 750 mg/150 ml premix  Status:  Discontinued     750 mg 150 mL/hr over 60 Minutes Intravenous Every 24 hours 06/25/19 0609 06/26/19 1109   06/25/19 1800  ceFEPIme (MAXIPIME) 2 g in sodium chloride 0.9 % 100 mL IVPB  Status:  Discontinued     2 g 200 mL/hr over 30 Minutes Intravenous Every 12 hours 06/25/19 0609 06/27/19 0710   06/25/19 0615  vancomycin (VANCOCIN) 2,000 mg in sodium chloride 0.9 % 500 mL IVPB     2,000 mg 250 mL/hr over 120 Minutes Intravenous  Once 06/25/19 0600 06/25/19 0958   06/25/19 0600  ceFEPIme (MAXIPIME) 2 g in sodium chloride 0.9 % 100 mL IVPB     2 g 200 mL/hr over 30 Minutes Intravenous  Once 06/25/19 0557 06/25/19 0721   06/25/19 0600  metroNIDAZOLE (FLAGYL) IVPB 500 mg  Status:  Discontinued     500 mg 100 mL/hr over 60 Minutes Intravenous  Once 06/25/19 0557 06/25/19 0903   06/25/19 0600  vancomycin (VANCOCIN) IVPB 1000 mg/200 mL premix  Status:  Discontinued     1,000 mg 200 mL/hr over 60 Minutes Intravenous  Once 06/25/19 0557 06/25/19 0600      Subjective:  Patient seen and examined the bedside this morning.  Still hypertensive.  Denies any headache, nausea  or vomiting.  Feels comfortable.  Making good urine.  Eager to go home.  Objective: Vitals:   06/28/19 0800 06/28/19 0900 06/28/19 1000 06/28/19 1100  BP: (!) 171/95 (!) 169/107 (!) 167/107 (!) 164/104  Pulse: 60 (!) 58 (!) 59 (!) 59  Resp: 11 12 12 13   Temp:    99 F (37.2 C)  TempSrc:    Oral  SpO2: 98% 97% 98% 96%  Weight:      Height:        Intake/Output Summary (Last 24 hours) at 06/28/2019 1244 Last data filed at 06/28/2019  0800 Gross per 24 hour  Intake 2333.23 ml  Output 2005 ml  Net 328.23 ml   Filed Weights   06/26/19 0500 06/27/19 0427 06/28/19 0500  Weight: 85.3 kg 88.6 kg 89 kg    Examination:  General exam: Appears calm and comfortable ,Not in distress,average built HEENT:PERRL,Oral mucosa moist, Ear/Nose normal on gross exam Respiratory system: Bilateral equal air entry, normal vesicular breath sounds, no wheezes or crackles  Cardiovascular system: S1 & S2 heard, RRR. No JVD, murmurs, rubs, gallops or clicks. No pedal edema. Gastrointestinal system: Abdomen is nondistended, soft and nontender. No organomegaly or masses felt. Normal bowel sounds heard. Central nervous system: Alert and oriented. No focal neurological deficits. Extremities: No edema, no clubbing ,no cyanosis, distal peripheral pulses palpable. Skin: No rashes, lesions or ulcers,no icterus ,no pallor MSK: Normal muscle bulk,tone ,power Psychiatry: Judgement and insight appear normal. Mood & affect appropriate.     Data Reviewed: I have personally reviewed following labs and imaging studies  CBC: Recent Labs  Lab 06/24/19 1808 06/25/19 0556 06/25/19 0956 06/26/19 0248 06/26/19 0425 06/27/19 0227  WBC 6.8 9.1 10.2 8.6  --  8.0  NEUTROABS  --  8.3*  --   --   --   --   HGB 11.8* 14.1 12.9* 10.4* 10.2* 9.6*  HCT 35.4* 39.9 37.9* 30.5* 30.0* 28.9*  MCV 96.2 94.5 95.7 96.8  --  99.0  PLT 257 293 287 216  --  [id number]   Basic Metabolic Panel: Recent Labs  Lab 06/24/19 1808 06/25/19 0956 06/26/19 0248 06/26/19 0425 06/27/19 0227  NA 143 141 141 142 138  K 3.4* 2.9* 3.8 3.9 3.7  CL 108 104 107  --  108  CO2 23 23 22   --  23  GLUCOSE 121* 115* 118*  --  122*  BUN 24* 18 27*  --  30*  CREATININE 2.83* 2.53* 3.33*  --  3.53*  CALCIUM 8.9 8.8* 8.3*  --  8.2*  MG  --  1.6* 1.7  --  1.9  PHOS  --  2.0*  --   --  3.4   GFR: Estimated Creatinine Clearance: 27.6 mL/min (A) (by C-G formula based on SCr of 3.53 mg/dL (H)).  Liver Function Tests: Recent Labs  Lab 06/24/19 1808 06/25/19 0956 06/27/19 0227  AST 25 23 14*  ALT 14 13 10   ALKPHOS 59 65 43  BILITOT 1.1 1.6* 1.0  PROT 7.4 7.6 5.3*  ALBUMIN 4.7 4.9 3.3*   Recent Labs  Lab 06/25/19 0956  LIPASE 39  AMYLASE 112*   No results for input(s): AMMONIA in the last 168 hours. Coagulation Profile: Recent Labs  Lab 06/25/19 0556 06/25/19 0956  INR 1.1 1.1   Cardiac Enzymes: Recent Labs  Lab 06/27/19 1520  CKTOTAL 121   BNP (last 3 results) No results for input(s): PROBNP in the last 8760 hours. HbA1C: No results for  input(s): HGBA1C in the last 72 hours. CBG: Recent Labs  Lab 06/25/19 1156 06/25/19 1601 06/25/19 1942 06/25/19 2358 06/26/19 0402  GLUCAP 113* 102* 117* 105* 103*   Lipid Profile: No results for input(s): CHOL, HDL, LDLCALC, TRIG, CHOLHDL, LDLDIRECT in the last 72 hours. Thyroid Function Tests: No results for input(s): TSH, T4TOTAL, FREET4, T3FREE, THYROIDAB in the last 72 hours. Anemia Panel: No results for input(s): VITAMINB12, FOLATE, FERRITIN, TIBC, IRON, RETICCTPCT in the last 72 hours. Sepsis Labs: Recent Labs  Lab 06/24/19 1809 06/25/19 0437 06/25/19 0556 06/25/19 0956 06/26/19 0248 06/27/19 0227  PROCALCITON  --   --   --  <0.10 0.17 <0.10  LATICACIDVEN 2.0* 2.7* 1.8 1.8  --   --     Recent Results (from the past 240 hour(s))  SARS Coronavirus 2 [facility] order, Performed in [facility] hospital lab) Nasopharyngeal Nasopharyngeal Swab     Status: None   Collection Time: 06/24/19  7:18 PM   Specimen: Nasopharyngeal Swab  Result Value Ref Range Status   SARS Coronavirus 2 NEGATIVE NEGATIVE Final    Comment: (NOTE) If result is NEGATIVE SARS-CoV-2 target nucleic acids are NOT DETECTED. The SARS-CoV-2 RNA is generally detectable in upper and lower  respiratory specimens during the acute phase of infection. The lowest  concentration of SARS-CoV-2 viral copies this assay can detect is 250  copies  / mL. A negative result does not preclude SARS-CoV-2 infection  and should not be used as the sole basis for treatment or other  patient management decisions.  A negative result may occur with  improper specimen collection / handling, submission of specimen other  than nasopharyngeal swab, presence of viral mutation(s) within the  areas targeted by this assay, and inadequate number of viral copies  (<250 copies / mL). A negative result must be combined with clinical  observations, patient history, and epidemiological information. If result is POSITIVE SARS-CoV-2 target nucleic acids are DETECTED. The SARS-CoV-2 RNA is generally detectable in upper and lower  respiratory specimens dur ing the acute phase of infection.  Positive  results are indicative of active infection with SARS-CoV-2.  Clinical  correlation with patient history and other diagnostic information is  necessary to determine patient infection status.  Positive results do  not rule out bacterial infection or co-infection with other viruses. If result is PRESUMPTIVE POSTIVE SARS-CoV-2 nucleic acids MAY BE PRESENT.   A presumptive positive result was obtained on the submitted specimen  and confirmed on repeat testing.  While 2019 novel coronavirus  (SARS-CoV-2) nucleic acids may be present in the submitted sample  additional confirmatory testing may be necessary for epidemiological  and / or clinical management purposes  to differentiate between  SARS-CoV-2 and other Sarbecovirus currently known to infect humans.  If clinically indicated additional testing with an alternate test  methodology [phone]) is advised. The SARS-CoV-2 RNA is generally  detectable in upper and lower respiratory sp ecimens during the acute  phase of infection. The expected result is Negative. Fact Sheet for Patients:  StrictlyIdeas.no Fact Sheet for Healthcare Providers: BankingDealers.co.za This test  is not yet approved or cleared by the [place] FDA and has been authorized for detection and/or diagnosis of SARS-CoV-2 by FDA under an Emergency Use Authorization (EUA).  This EUA will remain in effect (meaning this test can be used) for the duration of the COVID-19 declaration under Section 564(b)(1) of the Act, 21 U.S.C. section 360bbb-3(b)(1), unless the authorization is terminated or revoked sooner. Performed at [facility], [address], [place], [place] [address]   Culture, blood (routine x 2)     Status: None (Preliminary result)   Collection Time: 06/25/19  4:15 AM   Specimen: BLOOD LEFT ARM  Result Value Ref Range Status   Specimen Description BLOOD LEFT ARM  Final   Special Requests   Final    BOTTLES DRAWN AEROBIC AND ANAEROBIC Blood Culture adequate volume   Culture   Final    NO GROWTH 3 DAYS Performed at [facility], [place] [address] [place], [place] [address]    Report Status PENDING  Incomplete  Culture, blood (routine x 2)     Status: None (Preliminary result)   Collection Time: 06/25/19  4:30 AM   Specimen: BLOOD LEFT HAND  Result Value Ref Range Status   Specimen Description BLOOD LEFT HAND  Final   Special Requests   Final    BOTTLES DRAWN AEROBIC ONLY Blood Culture adequate volume   Culture   Final    NO GROWTH 3 DAYS Performed at [facility], [place] [address] [place], [place] [address]    Report Status PENDING  Incomplete  Urine culture     Status: Abnormal   Collection Time: 06/25/19  5:56 AM   Specimen: In/Out Cath Urine  Result Value Ref Range Status   Specimen Description IN/OUT CATH URINE  Final   Special Requests NONE  Final   Culture (A)  Final    <10,000 COLONIES/mL INSIGNIFICANT GROWTH Performed at [facility], [place] [address] [place], [place] [address]    Report Status 06/26/2019 FINAL  Final  MRSA PCR Screening     Status: None   Collection Time: 06/26/19  1:02 PM   Specimen: Nasopharyngeal  Result Value Ref  Range Status   MRSA by PCR NEGATIVE NEGATIVE Final    Comment:        The GeneXpert MRSA Assay (FDA approved for NASAL specimens only), is one component of a comprehensive MRSA colonization surveillance program. It is not intended to diagnose MRSA infection nor to guide or monitor treatment for MRSA infections. Performed at [facility], [place] [address], [place], [place] [address]          Radiology Studies: US Renal  Result Date: 06/27/2019 CLINICAL DATA:  Kidney injury.  Hypertension. EXAM: RENAL / URINARY TRACT ULTRASOUND COMPLETE COMPARISON:  CT scan 04/04/2018 FINDINGS: Right Kidney: Renal measurements: 11.7 x 4.6 x 6.3 cm = volume: 177.2 mL. Increased echogenicity suggesting medical renal disease. Normal renal cortical thickness. Renal cysts are noted. The largest cyst is in the upper pole region measures 4.0 x 2.4 x 4.8 cm it is slightly complicated by septations but unchanged when compared to prior CT scan. No hydronephrosis. Left Kidney: Renal measurements: 10.7 x 6.0 x 5.7 cm = volume: 190.7 mL. Diffuse increased echogenicity consistent with medical renal disease. Normal renal cortical thickness. Small midpole renal cysts are noted. No worrisome lesions. Bladder: Appears normal for degree of bladder distention. IMPRESSION: 1. Normal renal size and cortical thickness but diffuse increased echogenicity suggesting medical renal disease. 2. Bilateral renal cysts. 3. No hydronephrosis. 4. Normal bladder. Electronically Signed   By: Marijo Sanes M.D.   On: 06/27/2019 11:38        Scheduled Meds: . acetaminophen  1,000 mg Oral Q8H  . amLODipine  10 mg Oral Daily  . Chlorhexidine Gluconate Cloth  6 each Topical Daily  . cloNIDine  0.3 mg Transdermal Weekly  .  enoxaparin (LOVENOX) injection  30 mg Subcutaneous Q24H  . feeding supplement  1 Container Oral TID BM  . influenza vac split quadrivalent PF  0.5 mL Intramuscular Tomorrow-1000  . metoprolol tartrate  25 mg Oral  BID  . pantoprazole  40 mg Oral Daily  . pneumococcal 23 valent vaccine  0.5 mL Intramuscular Tomorrow-1000   Continuous Infusions: . sodium chloride       LOS: 3 days    Time spent: 35 mins.More than 50% of that time was spent in counseling and/or coordination of care.      Shelly Coss, MD [facility] Pager [phone]  If 7PM-7AM, please contact night-coverage [email or web address] Password [email or web address] 06/28/2019, 12:44 PM

## 2019-06-28 NOTE — Progress Notes (Signed)
Subjective:  1700 of urine, hemodynamically stable- BP in the 140's to 160's- crt essentially stable  Objective Vital signs in last 24 hours: Vitals:   06/28/19 0300 06/28/19 0400 06/28/19 0406 06/28/19 0500  BP: (!) 162/88 (!) 154/86  (!) 148/83  Pulse: 64 63  63  Resp: 15 14  12   Temp:   99.1 F (37.3 C)   TempSrc:   Oral   SpO2: 97% 97%  98%  Weight:    89 kg  Height:       Weight change: 0.4 kg  Intake/Output Summary (Last 24 hours) at 06/28/2019 0556 Last data filed at 06/28/2019 0500 Gross per 24 hour  Intake 2454.43 ml  Output 1830 ml  Net 624.43 ml    Assessment/ Plan: Pt is a 48 y.o. yo male with fairly advanced CKD at baseline (crt 2.8-3)  Formerly followed by [place] who was admitted on 06/24/2019 with  Hypertensive urgency/emergency  Assessment/Plan: 1. HTN urgency/emergency-  Currently not requiring IV meds.  On amlodipine 10/metoprolol 25 BID/clonidine patch 0.3 with PRN iv labetalol and hydralazine.  Initially over corrected but adequate now.  On NS ?  Will stop 2. Renal-  Some A on CRF in the setting of above- making plenty of urine and no dialysis indications- maybe trying to plateau 3. Anemia- has drifted down in the course of hosp- will check iron stores- no esa for now 4. Dispo-  Would be good if no IV BP med requirement and we make sure he is able to get clonidine patch out of hospital.  crt seems to be stabilizing- get him out to regular floor bed, increase his activity, I would say if in the neighborhood tomorrow with BP about the same and crt about the same I would be comfortable with D/C.  Will likely need nephrology follow up in [facility] upon Dr. Lysle Morales retirement - he agrees     Louis Meckel    Labs: Basic Metabolic Panel: Recent Labs  Lab 06/25/19 0956 06/26/19 0248 06/26/19 0425 06/27/19 0227  NA 141 141 142 138  K 2.9* 3.8 3.9 3.7  CL 104 107  --  108  CO2 23 22  --  23  GLUCOSE 115* 118*  --  122*  BUN 18 27*  --  30*   CREATININE 2.53* 3.33*  --  3.53*  CALCIUM 8.8* 8.3*  --  8.2*  PHOS 2.0*  --   --  3.4   Liver Function Tests: Recent Labs  Lab 06/24/19 1808 06/25/19 0956 06/27/19 0227  AST 25 23 14*  ALT 14 13 10   ALKPHOS 59 65 43  BILITOT 1.1 1.6* 1.0  PROT 7.4 7.6 5.3*  ALBUMIN 4.7 4.9 3.3*   Recent Labs  Lab 06/25/19 0956  LIPASE 39  AMYLASE 112*   No results for input(s): AMMONIA in the last 168 hours. CBC: Recent Labs  Lab 06/24/19 1808 06/25/19 0556 06/25/19 0956 06/26/19 0248 06/26/19 0425 06/27/19 0227  WBC 6.8 9.1 10.2 8.6  --  8.0  NEUTROABS  --  8.3*  --   --   --   --   HGB 11.8* 14.1 12.9* 10.4* 10.2* 9.6*  HCT 35.4* 39.9 37.9* 30.5* 30.0* 28.9*  MCV 96.2 94.5 95.7 96.8  --  99.0  PLT 257 293 287 216  --  194   Cardiac Enzymes: Recent Labs  Lab 06/27/19 1520  CKTOTAL 121   CBG: Recent Labs  Lab 06/25/19 1156 06/25/19 1601 06/25/19 1942 06/25/19  2358 06/26/19 0402  GLUCAP 113* 102* 117* 105* 103*    Iron Studies: No results for input(s): IRON, TIBC, TRANSFERRIN, FERRITIN in the last 72 hours. Studies/Results: US Renal  Result Date: 06/27/2019 CLINICAL DATA:  Kidney injury.  Hypertension. EXAM: RENAL / URINARY TRACT ULTRASOUND COMPLETE COMPARISON:  CT scan 04/04/2018 FINDINGS: Right Kidney: Renal measurements: 11.7 x 4.6 x 6.3 cm = volume: 177.2 mL. Increased echogenicity suggesting medical renal disease. Normal renal cortical thickness. Renal cysts are noted. The largest cyst is in the upper pole region measures 4.0 x 2.4 x 4.8 cm it is slightly complicated by septations but unchanged when compared to prior CT scan. No hydronephrosis. Left Kidney: Renal measurements: 10.7 x 6.0 x 5.7 cm = volume: 190.7 mL. Diffuse increased echogenicity consistent with medical renal disease. Normal renal cortical thickness. Small midpole renal cysts are noted. No worrisome lesions. Bladder: Appears normal for degree of bladder distention. IMPRESSION: 1. Normal renal size  and cortical thickness but diffuse increased echogenicity suggesting medical renal disease. 2. Bilateral renal cysts. 3. No hydronephrosis. 4. Normal bladder. Electronically Signed   By: Marijo Sanes M.D.   On: 06/27/2019 11:38   Medications: Infusions: . sodium chloride 75 mL/hr at 06/28/19 0500  . sodium chloride      Scheduled Medications: . acetaminophen  1,000 mg Oral Q8H  . amLODipine  10 mg Oral Daily  . Chlorhexidine Gluconate Cloth  6 each Topical Daily  . cloNIDine  0.3 mg Transdermal Weekly  . enoxaparin (LOVENOX) injection  30 mg Subcutaneous Q24H  . feeding supplement  1 Container Oral TID BM  . influenza vac split quadrivalent PF  0.5 mL Intramuscular Tomorrow-1000  . metoprolol tartrate  25 mg Oral BID  . pantoprazole  40 mg Oral Daily  . pneumococcal 23 valent vaccine  0.5 mL Intramuscular Tomorrow-1000    have reviewed scheduled and prn medications.  Physical Exam: General: NAD Heart: RRR Lungs: mostly clear Abdomen: soft, non tender Extremities: no edema    06/28/2019,5:56 AM  LOS: 3 days

## 2019-06-29 LAB — CBC
HCT: 31.1 % — ABNORMAL LOW (ref 39.0–52.0)
Hemoglobin: 10.4 g/dL — ABNORMAL LOW (ref 13.0–17.0)
MCH: 32.3 pg (ref 26.0–34.0)
MCHC: 33.4 g/dL (ref 30.0–36.0)
MCV: 96.6 fL (ref 80.0–100.0)
Platelets: 206 10*3/uL (ref 150–400)
RBC: 3.22 MIL/uL — ABNORMAL LOW (ref 4.22–5.81)
RDW: 10.8 % — ABNORMAL LOW (ref 11.5–15.5)
WBC: 4.5 10*3/uL (ref 4.0–10.5)
nRBC: 0 % (ref 0.0–0.2)

## 2019-06-29 LAB — BASIC METABOLIC PANEL
Anion gap: 9 (ref 5–15)
BUN: 19 mg/dL (ref 6–20)
CO2: 27 mmol/L (ref 22–32)
Calcium: 8.7 mg/dL — ABNORMAL LOW (ref 8.9–10.3)
Chloride: 104 mmol/L (ref 98–111)
Creatinine, Ser: 2.52 mg/dL — ABNORMAL HIGH (ref 0.61–1.24)
GFR calc Af Amer: 33 mL/min — ABNORMAL LOW (ref >60)
GFR calc non Af Amer: 29 mL/min — ABNORMAL LOW (ref >60)
Glucose, Bld: 95 mg/dL (ref 70–99)
Potassium: 3.3 mmol/L — ABNORMAL LOW (ref 3.5–5.1)
Sodium: 140 mmol/L (ref 135–145)

## 2019-06-29 LAB — IRON AND TIBC
Iron: 84 ug/dL (ref 45–182)
Saturation Ratios: 39 % (ref 17.9–39.5)
TIBC: 213 ug/dL — ABNORMAL LOW (ref 250–450)
UIBC: 129 ug/dL

## 2019-06-29 LAB — FERRITIN: Ferritin: 312 ng/mL (ref 24–336)

## 2019-06-29 MED ORDER — METOPROLOL TARTRATE 25 MG PO TABS: 25.0000 mg | ORAL_TABLET | Freq: Two times a day (BID) | ORAL | 0 refills | Status: DC

## 2019-06-29 MED ORDER — CLONIDINE 0.3 MG/24HR TD PTWK: 0.3000 mg | MEDICATED_PATCH | TRANSDERMAL | 0 refills | Status: DC

## 2019-06-29 MED ORDER — POTASSIUM CHLORIDE CRYS ER 20 MEQ PO TBCR
40.0000 meq | EXTENDED_RELEASE_TABLET | Freq: Once | ORAL | Status: AC
  Administered 2019-06-29: 40 meq via ORAL
  Filled 2019-06-29: qty 2

## 2019-06-29 MED ORDER — HYDRALAZINE HCL 50 MG PO TABS: 50.0000 mg | ORAL_TABLET | Freq: Three times a day (TID) | ORAL | 0 refills | Status: DC

## 2019-06-29 NOTE — Progress Notes (Signed)
Subjective:  2700 of urine, hemodynamically stable- BP in the 130's to 160's- crt better- did require IV hydralazine overnight    Objective Vital signs in last 24 hours: Vitals:   06/29/19 0340 06/29/19 0400 06/29/19 0500 06/29/19 0600  BP:  134/84 (!) 149/88 (!) 148/87  Pulse:  (!) 58 65 60  Resp:  11 14 11   Temp: 99 F (37.2 C)     TempSrc: Oral     SpO2:  93% 97% 97%  Weight:      Height:       Weight change:   Intake/Output Summary (Last 24 hours) at 06/29/2019 0617 Last data filed at 06/29/2019 0300 Gross per 24 hour  Intake 325.81 ml  Output 2750 ml  Net -2424.19 ml    Assessment/ Plan: Pt is a 48 y.o. yo male with fairly advanced CKD at baseline (crt 2.8-3)  Formerly followed by [place] who was admitted on 50/01/2019 with  Hypertensive urgency/emergency  Assessment/Plan: 1. HTN urgency/emergency-  Currently not requiring continuous IV meds but getting PRN hydralazine.  On amlodipine 10/metoprolol 25 BID/clonidine patch 0.3 with PRN IV hydralazine.  Initially over corrected but adequate now.  I think can continue to be fine tuned as an OP 2. Renal-  Some A on CRF in the setting of above- making plenty of urine and no dialysis indications- crt improved today  3. Anemia- has drifted down in the course of hosp-  iron stores OK- no esa for now- better 4. Dispo-  Would be good if no IV BP med requirement and we make sure he is able to get clonidine patch out of hospital.  crt better-  I am comfortable with D/C.   We can continue to fine tune BP as OP, he has cuff at home and can monitor.  Will need nephrology follow up in [facility] upon Dr. Lysle Morales retirement - he agrees - I will arrange    [name]    Labs: Basic Metabolic Panel: Recent Labs  Lab 06/25/19 0956 06/26/19 0248 06/26/19 0425 06/27/19 0227 06/29/19 0224  NA 141 141 142 138 140  K 2.9* 3.8 3.9 3.7 3.3*  CL 104 107  --  108 104  CO2 23 22  --  23 27  GLUCOSE 115* 118*  --  122* 95  BUN 18 27*   --  30* 19  CREATININE 2.53* 3.33*  --  3.53* 2.52*  CALCIUM 8.8* 8.3*  --  8.2* 8.7*  PHOS 2.0*  --   --  3.4  --    Liver Function Tests: Recent Labs  Lab 06/24/19 1808 06/25/19 0956 06/27/19 0227  AST 25 23 14*  ALT 14 13 10   ALKPHOS 59 65 43  BILITOT 1.1 1.6* 1.0  PROT 7.4 7.6 5.3*  ALBUMIN 4.7 4.9 3.3*   Recent Labs  Lab 06/25/19 0956  LIPASE 39  AMYLASE 112*   No results for input(s): AMMONIA in the last 168 hours. CBC: Recent Labs  Lab 06/25/19 0556 06/25/19 0956 06/26/19 0248 06/26/19 0425 06/27/19 0227 06/29/19 0224  WBC 9.1 10.2 8.6  --  8.0 4.5  NEUTROABS 8.3*  --   --   --   --   --   HGB 14.1 12.9* 10.4* 10.2* 9.6* 10.4*  HCT 39.9 37.9* 30.5* 30.0* 28.9* 31.1*  MCV 94.5 95.7 96.8  --  99.0 96.6  PLT 293 287 216  --  194 206   Cardiac Enzymes: Recent Labs  Lab 06/27/19 1520  CKTOTAL 121   CBG: Recent Labs  Lab 06/25/19 1156 06/25/19 1601 06/25/19 1942 06/25/19 2358 06/26/19 0402  GLUCAP 113* 102* 117* 105* 103*    Iron Studies:  Recent Labs    06/29/19 0224  IRON 84  TIBC 213*  FERRITIN 312   Studies/Results: US Renal  Result Date: 06/27/2019 CLINICAL DATA:  Kidney injury.  Hypertension. EXAM: RENAL / URINARY TRACT ULTRASOUND COMPLETE COMPARISON:  CT scan 04/04/2018 FINDINGS: Right Kidney: Renal measurements: 11.7 x 4.6 x 6.3 cm = volume: 177.2 mL. Increased echogenicity suggesting medical renal disease. Normal renal cortical thickness. Renal cysts are noted. The largest cyst is in the upper pole region measures 4.0 x 2.4 x 4.8 cm it is slightly complicated by septations but unchanged when compared to prior CT scan. No hydronephrosis. Left Kidney: Renal measurements: 10.7 x 6.0 x 5.7 cm = volume: 190.7 mL. Diffuse increased echogenicity consistent with medical renal disease. Normal renal cortical thickness. Small midpole renal cysts are noted. No worrisome lesions. Bladder: Appears normal for degree of bladder distention. IMPRESSION:  1. Normal renal size and cortical thickness but diffuse increased echogenicity suggesting medical renal disease. 2. Bilateral renal cysts. 3. No hydronephrosis. 4. Normal bladder. Electronically Signed   By: Marijo Sanes M.D.   On: 06/27/2019 11:38   Medications: Infusions: . sodium chloride      Scheduled Medications: . acetaminophen  1,000 mg Oral Q8H  . amLODipine  10 mg Oral Daily  . Chlorhexidine Gluconate Cloth  6 each Topical Daily  . cloNIDine  0.3 mg Transdermal Weekly  . enoxaparin (LOVENOX) injection  30 mg Subcutaneous Q24H  . feeding supplement  1 Container Oral TID BM  . influenza vac split quadrivalent PF  0.5 mL Intramuscular Tomorrow-1000  . metoprolol tartrate  25 mg Oral BID  . pantoprazole  40 mg Oral Daily  . pneumococcal 23 valent vaccine  0.5 mL Intramuscular Tomorrow-1000    have reviewed scheduled and prn medications.  Physical Exam: General: NAD- looks good Heart: RRR Lungs: mostly clear Abdomen: soft, non tender Extremities: no edema    06/29/2019,6:17 AM  LOS: 4 days

## 2019-06-29 NOTE — Discharge Summary (Signed)
Physician Discharge Summary  [name] [id number] DOB: 1971-03-11 DOA: 06/24/2019  PCP: Fayrene Helper, MD  Admit date: 06/24/2019 Discharge date: 06/29/2019  Admitted From: Home Disposition:  Home  Discharge Condition:Stable CODE STATUS:FULL Diet recommendation: Heart Healthy   Brief/Interim Summary:  Patient is a 48 year old male with history of chronic back pain, hypertension, GI bleed, GERD, hyperlipidemia, migraines, obesity, sleep apnea who was initially admitted with hypertensive urgency, nausea, vomiting at [facility].  He was transferred to [facility] for MRI of the brain which did not show any acute intracranial changes.  He was found to have elevated lactic acid, acute kidney injury presentation.  Patient was admitted under PCCM service for management of hypertensive emergency.  Nephrology was following for acute kidney injury. Patient transferred to [facility] service on 06/28/2019.  His blood pressure medicines readjusted.  Kidney function and blood pressure has improved.  He has an appointment with [place] [facility] in October 26.  Following problems were addressed during his hospitalization:  Hypertensive emergency: Blood pressure improving.  Medication adjusted.  Currently on amlodipine, clonidine patch,metoprolol.Also started on hydralazine.  AKI on CKD stage IV: Baseline kidney function at 2.8-3.  He follows with Dr. Lowanda Foster at [place].  Nephrology following.  Kidney function close to baseline now.  He is making good amount of urine.  He needs follow-up with nephrology on discharge.  Normocytic anemia: Most likely associated  with chronic kidney disease.  Currently CBC stable.  Continue to monitor.    Iron studies showed normal iron, ferritin  Sepsis ruled out: Presented with elevated procalcitonin, lactic acidosis on presentation.  He was started  on antibiotics initially but has been discontinued now.     Discharge Diagnoses:  Active Problems:    Malignant hypertension   Hypertension   Atrial premature depolarization   Acute kidney injury ([place])   Sepsis due to undetermined organism [facility])    Discharge Instructions  Discharge Instructions    Diet - low sodium heart healthy   Complete by: As directed    Discharge instructions   Complete by: As directed    1)Please take prescribed medications as instructed. You are recommended to take clonidine patch,amlodipine,metoprolol and hydralazine. 2)Monitor your blood pressure at home. 3)Follow up with your PCP within a week. 4)Follow up with nephrology on the given appointment date.   Increase activity slowly   Complete by: As directed      Allergies as of 06/29/2019      Reactions   Bee Venom Anaphylaxis   Prochlorperazine Edisylate Other (See Comments)   Reaction: Jittery,uncomfortable feeling   Cyclobenzaprine Hcl Other (See Comments)   REACTION: feel anxious, nausea   Aleve [naproxen Sodium] Nausea And Vomiting   States that this medication also causes abdominal pain   Carbamazepine    REACTION: unknown reaction   Geodon [ziprasidone Hcl] Nausea And Vomiting   Ibuprofen Nausea And Vomiting   States that this medication also causes abdominal pain   Maxzide [hydrochlorothiazide W-triamterene]    headache   Nsaids Other (See Comments)   H/o severe anemia from UGIB from ulcer   Tylenol [acetaminophen] Nausea And Vomiting   Patient states that this medication also causes abdominal pain   Other Nausea And Vomiting, Rash, Other (See Comments)   ALL MUSCLE RELAXANTS: states that they affect sciatic nerve, may cause nausea and vomiting, rash      Medication List    STOP taking these medications   cloNIDine 0.3 MG tablet Commonly known as: CATAPRES  TAKE these medications   amLODipine 10 MG tablet Commonly known as: NORVASC Take 1 tablet (10 mg total) by mouth daily.   cloNIDine 0.3 mg/24hr patch Commonly known as: CATAPRES - Dosed in mg/24 hr Place 1 patch (0.3  mg total) onto the skin once a week. Start taking on: July 02, 2019   esomeprazole 20 MG capsule Commonly known as: NEXIUM Take 20 mg by mouth daily at 12 noon.   hydrALAZINE 50 MG tablet Commonly known as: APRESOLINE Take 1 tablet (50 mg total) by mouth 3 (three) times daily. What changed:   medication strength  how much to take   hydrOXYzine 50 MG capsule Commonly known as: VISTARIL Take 50 mg by mouth 2 (two) times daily as needed for anxiety or itching.   metoprolol tartrate 25 MG tablet Commonly known as: LOPRESSOR Take 1 tablet (25 mg total) by mouth 2 (two) times daily.   ondansetron 8 MG tablet Commonly known as: Zofran Take 1 tablet (8 mg total) by mouth every 4 (four) hours as needed. What changed: reasons to take this      Follow-up Information    Fayrene Helper, MD. Schedule an appointment as soon as possible for a visit in 1 week(s).   Specialty: Family Medicine Contact information: [address], [address] [place] [address] [phone]          Allergies  Allergen Reactions  . Bee Venom Anaphylaxis  . Prochlorperazine Edisylate Other (See Comments)    Reaction: Jittery,uncomfortable feeling  . Cyclobenzaprine Hcl Other (See Comments)    REACTION: feel anxious, nausea  . Aleve [Naproxen Sodium] Nausea And Vomiting    States that this medication also causes abdominal pain  . Carbamazepine     REACTION: unknown reaction  . Geodon [Ziprasidone Hcl] Nausea And Vomiting  . Ibuprofen Nausea And Vomiting    States that this medication also causes abdominal pain  . Maxzide [Hydrochlorothiazide W-Triamterene]     headache  . Nsaids Other (See Comments)    H/o severe anemia from UGIB from ulcer  . Tylenol [Acetaminophen] Nausea And Vomiting    Patient states that this medication also causes abdominal pain  . Other Nausea And Vomiting, Rash and Other (See Comments)    ALL MUSCLE RELAXANTS: states that they affect sciatic nerve, may cause  nausea and vomiting, rash    Consultations:  Nephrology,PCCM   Procedures/Studies: Ct Head Wo Contrast  Result Date: 06/24/2019 CLINICAL DATA:  Altered level of consciousness, unexplained. EXAM: CT HEAD WITHOUT CONTRAST TECHNIQUE: Contiguous axial images were obtained from the base of the skull through the vertex without intravenous contrast. COMPARISON:  Head CT dated 01/19/2019 FINDINGS: Brain: No hydrocephalus. No mass, hemorrhage, edema or other evidence of acute parenchymal abnormality. No extra-axial hemorrhage. Vascular: No hyperdense vessel or unexpected calcification. Skull: Normal. Negative for fracture or focal lesion. Sinuses/Orbits: No acute finding. Other: None. IMPRESSION: Negative head CT. No intracranial mass, hemorrhage or edema. Electronically Signed   By: Franki Cabot M.D.   On: 06/24/2019 19:52   Mr Angio Head Wo Contrast  Result Date: 06/25/2019 CLINICAL DATA:  Malignant hypertension with altered mental status EXAM: MRI HEAD WITHOUT CONTRAST MRA HEAD WITHOUT CONTRAST MRA NECK WITHOUT CONTRAST TECHNIQUE: Multiplanar, multiecho pulse sequences of the brain and surrounding structures were obtained without intravenous contrast. Angiographic images of the Circle of Willis were obtained using MRA technique without intravenous contrast. Angiographic images of the neck were obtained using MRA technique without intravenous contrast. Carotid stenosis  measurements (when applicable) are obtained utilizing NASCET criteria, using the distal internal carotid diameter as the denominator. COMPARISON:  Head CT earlier today.  Brain MRI 08/09/2010 FINDINGS: MRI HEAD FINDINGS Brain: No acute infarction, hemorrhage, hydrocephalus, extra-axial collection or mass lesion. Very mild patchy FLAIR hyperintensity in the cerebral white matter from remote nonspecific insult without significant change since 2011. History suggests microvascular ischemia in the setting of hypertension. There is also history of  migraines. Vascular: Arterial findings below.  Normal flow voids. Skull and upper cervical spine: Negative for marrow lesion. 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 MRA HEAD FINDINGS Generalized mild motion artifact. Right dominant vertebral artery with only a tiny left vertebral continue into the basilar. Vertebral, basilar, and carotid vessels are smooth and diffusely patent. Apparent atheromatous irregularity of medium size MCA branches may be from artifact. 2 mm anterior bulge in the anterior communicating artery region. MRA NECK FINDINGS Non covered great vessel origins, which is a typical limitation by MRA. The right vertebral artery is dominant. Vessels are smooth and widely patent. No evidence of atheromatous disease. IMPRESSION: Brain MRI: No emergent finding or change from 2011. Intracranial MRA: 1. No emergent finding or flow limiting stenosis. There is limitation due to motion artifact. 2. 2 mm bulbous area in the anterior communicating artery region, consider 1 year follow-up MRA. Neck MRA: Negative. Electronically Signed   By: Monte Fantasia M.D.   On: 06/25/2019 04:37   Mr Angio Neck Wo Contrast  Result Date: 06/25/2019 CLINICAL DATA:  Malignant hypertension with altered mental status EXAM: MRI HEAD WITHOUT CONTRAST MRA HEAD WITHOUT CONTRAST MRA NECK WITHOUT CONTRAST TECHNIQUE: Multiplanar, multiecho pulse sequences of the brain and surrounding structures were obtained without intravenous contrast. Angiographic images of the Circle of Willis were obtained using MRA technique without intravenous contrast. Angiographic images of the neck were obtained using MRA technique without intravenous contrast. Carotid stenosis measurements (when applicable) are obtained utilizing NASCET criteria, using the distal internal carotid diameter as the denominator. COMPARISON:  Head CT earlier today.  Brain MRI 08/09/2010 FINDINGS: MRI HEAD FINDINGS Brain: No acute  infarction, hemorrhage, hydrocephalus, extra-axial collection or mass lesion. Very mild patchy FLAIR hyperintensity in the cerebral white matter from remote nonspecific insult without significant change since 2011. History suggests microvascular ischemia in the setting of hypertension. There is also history of migraines. Vascular: Arterial findings below.  Normal flow voids. Skull and upper cervical spine: Negative for marrow lesion. 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 MRA HEAD FINDINGS Generalized mild motion artifact. Right dominant vertebral artery with only a tiny left vertebral continue into the basilar. Vertebral, basilar, and carotid vessels are smooth and diffusely patent. Apparent atheromatous irregularity of medium size MCA branches may be from artifact. 2 mm anterior bulge in the anterior communicating artery region. MRA NECK FINDINGS Non covered great vessel origins, which is a typical limitation by MRA. The right vertebral artery is dominant. Vessels are smooth and widely patent. No evidence of atheromatous disease. IMPRESSION: Brain MRI: No emergent finding or change from 2011. Intracranial MRA: 1. No emergent finding or flow limiting stenosis. There is limitation due to motion artifact. 2. 2 mm bulbous area in the anterior communicating artery region, consider 1 year follow-up MRA. Neck MRA: Negative. Electronically Signed   By: Monte Fantasia M.D.   On: 06/25/2019 04:37   Mr Brain Wo Contrast  Result Date: 06/25/2019 CLINICAL DATA:  Malignant hypertension with altered mental status EXAM: MRI HEAD WITHOUT  CONTRAST MRA HEAD WITHOUT CONTRAST MRA NECK WITHOUT CONTRAST TECHNIQUE: Multiplanar, multiecho pulse sequences of the brain and surrounding structures were obtained without intravenous contrast. Angiographic images of the Circle of Willis were obtained using MRA technique without intravenous contrast. Angiographic images of the neck were obtained  using MRA technique without intravenous contrast. Carotid stenosis measurements (when applicable) are obtained utilizing NASCET criteria, using the distal internal carotid diameter as the denominator. COMPARISON:  Head CT earlier today.  Brain MRI 08/09/2010 FINDINGS: MRI HEAD FINDINGS Brain: No acute infarction, hemorrhage, hydrocephalus, extra-axial collection or mass lesion. Very mild patchy FLAIR hyperintensity in the cerebral white matter from remote nonspecific insult without significant change since 2011. History suggests microvascular ischemia in the setting of hypertension. There is also history of migraines. Vascular: Arterial findings below.  Normal flow voids. Skull and upper cervical spine: Negative for marrow lesion. 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 MRA HEAD FINDINGS Generalized mild motion artifact. Right dominant vertebral artery with only a tiny left vertebral continue into the basilar. Vertebral, basilar, and carotid vessels are smooth and diffusely patent. Apparent atheromatous irregularity of medium size MCA branches may be from artifact. 2 mm anterior bulge in the anterior communicating artery region. MRA NECK FINDINGS Non covered great vessel origins, which is a typical limitation by MRA. The right vertebral artery is dominant. Vessels are smooth and widely patent. No evidence of atheromatous disease. IMPRESSION: Brain MRI: No emergent finding or change from 2011. Intracranial MRA: 1. No emergent finding or flow limiting stenosis. There is limitation due to motion artifact. 2. 2 mm bulbous area in the anterior communicating artery region, consider 1 year follow-up MRA. Neck MRA: Negative. Electronically Signed   By: Monte Fantasia M.D.   On: 06/25/2019 04:37   US Renal  Result Date: 06/27/2019 CLINICAL DATA:  Kidney injury.  Hypertension. EXAM: RENAL / URINARY TRACT ULTRASOUND COMPLETE COMPARISON:  CT scan 04/04/2018 FINDINGS: Right Kidney:  Renal measurements: 11.7 x 4.6 x 6.3 cm = volume: 177.2 mL. Increased echogenicity suggesting medical renal disease. Normal renal cortical thickness. Renal cysts are noted. The largest cyst is in the upper pole region measures 4.0 x 2.4 x 4.8 cm it is slightly complicated by septations but unchanged when compared to prior CT scan. No hydronephrosis. Left Kidney: Renal measurements: 10.7 x 6.0 x 5.7 cm = volume: 190.7 mL. Diffuse increased echogenicity consistent with medical renal disease. Normal renal cortical thickness. Small midpole renal cysts are noted. No worrisome lesions. Bladder: Appears normal for degree of bladder distention. IMPRESSION: 1. Normal renal size and cortical thickness but diffuse increased echogenicity suggesting medical renal disease. 2. Bilateral renal cysts. 3. No hydronephrosis. 4. Normal bladder. Electronically Signed   By: Marijo Sanes M.D.   On: 06/27/2019 11:38   Dg Chest Port 1 View  Result Date: 06/24/2019 CLINICAL DATA:  Headaches with vomiting since this morning. EXAM: PORTABLE CHEST 1 VIEW COMPARISON:  Radiographs 01/19/2019 and 08/31/2016. FINDINGS: 1812 hours. The left costophrenic angle is partially excluded. The heart size and mediastinal contours are normal. The lungs are clear. There is no pleural effusion or pneumothorax. No acute osseous findings are identified. Telemetry leads overlie the chest. IMPRESSION: No evidence of active cardiopulmonary process. Electronically Signed   By: Richardean Sale M.D.   On: 06/24/2019 19:00       Subjective:  Patient seen and examined at bedside this morning.  Hemodynamically stable for discharge.  Discharge Exam: Vitals:   06/29/19 0800 06/29/19 0845  BP: (!) 172/103 (!) 181/114  Pulse: (!) 59 62  Resp: 11 13  Temp:    SpO2: 97% 98%   Vitals:   06/29/19 0600 06/29/19 0700 06/29/19 0800 06/29/19 0845  BP: (!) 148/87 (!) 166/107 (!) 172/103 (!) 181/114  Pulse: 60 (!) 59 (!) 59 62  Resp: 11 11 11 13   Temp:   98.3 F (36.8 C)    TempSrc:  Oral    SpO2: 97% 96% 97% 98%  Weight:      Height:        General: Pt is alert, awake, not in acute distress Cardiovascular: RRR, S1/S2 +, no rubs, no gallops Respiratory: CTA bilaterally, no wheezing, no rhonchi Abdominal: Soft, NT, ND, bowel sounds + Extremities: no edema, no cyanosis    The results of significant diagnostics from this hospitalization (including imaging, microbiology, ancillary and laboratory) are listed below for reference.     Microbiology: Recent Results (from the past 240 hour(s))  SARS Coronavirus 2 [facility] order, Performed in [facility] hospital lab) Nasopharyngeal Nasopharyngeal Swab     Status: None   Collection Time: 06/24/19  7:18 PM   Specimen: Nasopharyngeal Swab  Result Value Ref Range Status   SARS Coronavirus 2 NEGATIVE NEGATIVE Final    Comment: (NOTE) If result is NEGATIVE SARS-CoV-2 target nucleic acids are NOT DETECTED. The SARS-CoV-2 RNA is generally detectable in upper and lower  respiratory specimens during the acute phase of infection. The lowest  concentration of SARS-CoV-2 viral copies this assay can detect is 250  copies / mL. A negative result does not preclude SARS-CoV-2 infection  and should not be used as the sole basis for treatment or other  patient management decisions.  A negative result may occur with  improper specimen collection / handling, submission of specimen other  than nasopharyngeal swab, presence of viral mutation(s) within the  areas targeted by this assay, and inadequate number of viral copies  (<250 copies / mL). A negative result must be combined with clinical  observations, patient history, and epidemiological information. If result is POSITIVE SARS-CoV-2 target nucleic acids are DETECTED. The SARS-CoV-2 RNA is generally detectable in upper and lower  respiratory specimens dur ing the acute phase of infection.  Positive  results are indicative of active infection with  SARS-CoV-2.  Clinical  correlation with patient history and other diagnostic information is  necessary to determine patient infection status.  Positive results do  not rule out bacterial infection or co-infection with other viruses. If result is PRESUMPTIVE POSTIVE SARS-CoV-2 nucleic acids MAY BE PRESENT.   A presumptive positive result was obtained on the submitted specimen  and confirmed on repeat testing.  While 2019 novel coronavirus  (SARS-CoV-2) nucleic acids may be present in the submitted sample  additional confirmatory testing may be necessary for epidemiological  and / or clinical management purposes  to differentiate between  SARS-CoV-2 and other Sarbecovirus currently known to infect humans.  If clinically indicated additional testing with an alternate test  methodology [phone]) is advised. The SARS-CoV-2 RNA is generally  detectable in upper and lower respiratory sp ecimens during the acute  phase of infection. The expected result is Negative. Fact Sheet for Patients:  StrictlyIdeas.no Fact Sheet for Healthcare Providers: BankingDealers.co.za This test is not yet approved or cleared by the [place] FDA and has been authorized for detection and/or diagnosis of SARS-CoV-2 by FDA under an Emergency Use Authorization (EUA).  This EUA will remain in effect (meaning this test can be used)  for the duration of the COVID-19 declaration under Section 564(b)(1) of the Act, 21 U.S.C. section 360bbb-3(b)(1), unless the authorization is terminated or revoked sooner. Performed at [facility], [address], [place], [place] [address]   Culture, blood (routine x 2)     Status: None (Preliminary result)   Collection Time: 06/25/19  4:15 AM   Specimen: BLOOD LEFT ARM  Result Value Ref Range Status   Specimen Description BLOOD LEFT ARM  Final   Special Requests   Final    BOTTLES DRAWN AEROBIC AND ANAEROBIC Blood Culture adequate  volume   Culture   Final    NO GROWTH 3 DAYS Performed at [facility], [place] [address], [place], [place] [address]    Report Status PENDING  Incomplete  Culture, blood (routine x 2)     Status: None (Preliminary result)   Collection Time: 06/25/19  4:30 AM   Specimen: BLOOD LEFT HAND  Result Value Ref Range Status   Specimen Description BLOOD LEFT HAND  Final   Special Requests   Final    BOTTLES DRAWN AEROBIC ONLY Blood Culture adequate volume   Culture   Final    NO GROWTH 3 DAYS Performed at [facility], [place] [address], [place], [place] [address]    Report Status PENDING  Incomplete  Urine culture     Status: Abnormal   Collection Time: 06/25/19  5:56 AM   Specimen: In/Out Cath Urine  Result Value Ref Range Status   Specimen Description IN/OUT CATH URINE  Final   Special Requests NONE  Final   Culture (A)  Final    <10,000 COLONIES/mL INSIGNIFICANT GROWTH Performed at [facility], [place] [address], [place], [place] [address]    Report Status 06/26/2019 FINAL  Final  MRSA PCR Screening     Status: None   Collection Time: 06/26/19  1:02 PM   Specimen: Nasopharyngeal  Result Value Ref Range Status   MRSA by PCR NEGATIVE NEGATIVE Final    Comment:        The GeneXpert MRSA Assay (FDA approved for NASAL specimens only), is one component of a comprehensive MRSA colonization surveillance program. It is not intended to diagnose MRSA infection nor to guide or monitor treatment for MRSA infections. Performed at [facility], [place] [address] [place], [place] [address]      Labs: BNP (last 3 results) No results for input(s): BNP in the last 8760 hours. Basic Metabolic Panel: Recent Labs  Lab 06/24/19 1808 06/25/19 0956 06/26/19 0248 06/26/19 0425 06/27/19 0227 06/29/19 0224  NA 143 141 141 142 138 140  K 3.4* 2.9* 3.8 3.9 3.7 3.3*  CL 108 104 107  --  108 104  CO2 23 23 22   --  23 27  GLUCOSE 121* 115* 118*  --  122* 95  BUN 24* 18 27*   --  30* 19  CREATININE 2.83* 2.53* 3.33*  --  3.53* 2.52*  CALCIUM 8.9 8.8* 8.3*  --  8.2* 8.7*  MG  --  1.6* 1.7  --  1.9  --   PHOS  --  2.0*  --   --  3.4  --    Liver Function Tests: Recent Labs  Lab 06/24/19 1808 06/25/19 0956 06/27/19 0227  AST 25 23 14*  ALT 14 13 10   ALKPHOS 59 65 43  BILITOT 1.1 1.6* 1.0  PROT 7.4 7.6 5.3*  ALBUMIN 4.7 4.9 3.3*   Recent  Labs  Lab 06/25/19 0956  LIPASE 39  AMYLASE 112*   No results for input(s): AMMONIA in the last 168 hours. CBC: Recent Labs  Lab 06/25/19 0556 06/25/19 0956 06/26/19 0248 06/26/19 0425 06/27/19 0227 06/29/19 0224  WBC 9.1 10.2 8.6  --  8.0 4.5  NEUTROABS 8.3*  --   --   --   --   --   HGB 14.1 12.9* 10.4* 10.2* 9.6* 10.4*  HCT 39.9 37.9* 30.5* 30.0* 28.9* 31.1*  MCV 94.5 95.7 96.8  --  99.0 96.6  PLT 293 287 216  --  194 206   Cardiac Enzymes: Recent Labs  Lab 06/27/19 1520  CKTOTAL 121   BNP: Invalid input(s): POCBNP CBG: Recent Labs  Lab 06/25/19 1156 06/25/19 1601 06/25/19 1942 06/25/19 2358 06/26/19 0402  GLUCAP 113* 102* 117* 105* 103*   D-Dimer No results for input(s): DDIMER in the last 72 hours. Hgb A1c No results for input(s): HGBA1C in the last 72 hours. Lipid Profile No results for input(s): CHOL, HDL, LDLCALC, TRIG, CHOLHDL, LDLDIRECT in the last 72 hours. Thyroid function studies No results for input(s): TSH, T4TOTAL, T3FREE, THYROIDAB in the last 72 hours.  Invalid input(s): FREET3 Anemia work up Recent Labs    06/29/19 0224  FERRITIN 312  TIBC 213*  IRON 84   Urinalysis    Component Value Date/Time   COLORURINE STRAW (A) 06/25/2019 1016   APPEARANCEUR CLEAR 06/25/2019 1016   LABSPEC 1.008 06/25/2019 1016   PHURINE 6.0 06/25/2019 1016   GLUCOSEU NEGATIVE 06/25/2019 1016   HGBUR SMALL (A) 06/25/2019 1016   BILIRUBINUR NEGATIVE 06/25/2019 1016   BILIRUBINUR small 04/07/2011 1343   KETONESUR NEGATIVE 06/25/2019 1016   PROTEINUR 100 (A) 06/25/2019 1016    UROBILINOGEN 0.2 e.u/dL 04/07/2011 1343   NITRITE NEGATIVE 06/25/2019 1016   LEUKOCYTESUR NEGATIVE 06/25/2019 1016   Sepsis Labs Invalid input(s): PROCALCITONIN,  WBC,  LACTICIDVEN Microbiology Recent Results (from the past 240 hour(s))  SARS Coronavirus 2 [facility] order, Performed in [place] hospital lab) Nasopharyngeal Nasopharyngeal Swab     Status: None   Collection Time: 06/24/19  7:18 PM   Specimen: Nasopharyngeal Swab  Result Value Ref Range Status   SARS Coronavirus 2 NEGATIVE NEGATIVE Final    Comment: (NOTE) If result is NEGATIVE SARS-CoV-2 target nucleic acids are NOT DETECTED. The SARS-CoV-2 RNA is generally detectable in upper and lower  respiratory specimens during the acute phase of infection. The lowest  concentration of SARS-CoV-2 viral copies this assay can detect is 250  copies / mL. A negative result does not preclude SARS-CoV-2 infection  and should not be used as the sole basis for treatment or other  patient management decisions.  A negative result may occur with  improper specimen collection / handling, submission of specimen other  than nasopharyngeal swab, presence of viral mutation(s) within the  areas targeted by this assay, and inadequate number of viral copies  (<250 copies / mL). A negative result must be combined with clinical  observations, patient history, and epidemiological information. If result is POSITIVE SARS-CoV-2 target nucleic acids are DETECTED. The SARS-CoV-2 RNA is generally detectable in upper and lower  respiratory specimens dur ing the acute phase of infection.  Positive  results are indicative of active infection with SARS-CoV-2.  Clinical  correlation with patient history and other diagnostic information is  necessary to determine patient infection status.  Positive results do  not rule out bacterial infection or co-infection with other viruses. If result  is PRESUMPTIVE POSTIVE SARS-CoV-2 nucleic acids MAY BE PRESENT.   A  presumptive positive result was obtained on the submitted specimen  and confirmed on repeat testing.  While 2019 novel coronavirus  (SARS-CoV-2) nucleic acids may be present in the submitted sample  additional confirmatory testing may be necessary for epidemiological  and / or clinical management purposes  to differentiate between  SARS-CoV-2 and other Sarbecovirus currently known to infect humans.  If clinically indicated additional testing with an alternate test  methodology [phone]) is advised. The SARS-CoV-2 RNA is generally  detectable in upper and lower respiratory sp ecimens during the acute  phase of infection. The expected result is Negative. Fact Sheet for Patients:  StrictlyIdeas.no Fact Sheet for Healthcare Providers: BankingDealers.co.za This test is not yet approved or cleared by the [place] FDA and has been authorized for detection and/or diagnosis of SARS-CoV-2 by FDA under an Emergency Use Authorization (EUA).  This EUA will remain in effect (meaning this test can be used) for the duration of the COVID-19 declaration under Section 564(b)(1) of the Act, 21 U.S.C. section 360bbb-3(b)(1), unless the authorization is terminated or revoked sooner. Performed at [facility], [address], [place], [place] [address]   Culture, blood (routine x 2)     Status: None (Preliminary result)   Collection Time: 06/25/19  4:15 AM   Specimen: BLOOD LEFT ARM  Result Value Ref Range Status   Specimen Description BLOOD LEFT ARM  Final   Special Requests   Final    BOTTLES DRAWN AEROBIC AND ANAEROBIC Blood Culture adequate volume   Culture   Final    NO GROWTH 3 DAYS Performed at [facility], [place] [address] [place], [place] [address]    Report Status PENDING  Incomplete  Culture, blood (routine x 2)     Status: None (Preliminary result)   Collection Time: 06/25/19  4:30 AM   Specimen: BLOOD LEFT HAND  Result Value Ref  Range Status   Specimen Description BLOOD LEFT HAND  Final   Special Requests   Final    BOTTLES DRAWN AEROBIC ONLY Blood Culture adequate volume   Culture   Final    NO GROWTH 3 DAYS Performed at [facility], [place] [address] [place], [place] [address]    Report Status PENDING  Incomplete  Urine culture     Status: Abnormal   Collection Time: 06/25/19  5:56 AM   Specimen: In/Out Cath Urine  Result Value Ref Range Status   Specimen Description IN/OUT CATH URINE  Final   Special Requests NONE  Final   Culture (A)  Final    <10,000 COLONIES/mL INSIGNIFICANT GROWTH Performed at [facility], [place] [address] [place], [place] [address]    Report Status 06/26/2019 FINAL  Final  MRSA PCR Screening     Status: None   Collection Time: 06/26/19  1:02 PM   Specimen: Nasopharyngeal  Result Value Ref Range Status   MRSA by PCR NEGATIVE NEGATIVE Final    Comment:        The GeneXpert MRSA Assay (FDA approved for NASAL specimens only), is one component of a comprehensive MRSA colonization surveillance program. It is not intended to diagnose MRSA infection nor to guide or monitor treatment for MRSA infections. Performed at [facility], [place] [address], [place], [place] [address]     Please note: You were cared for by a hospitalist during your hospital stay. Once you are discharged, your primary  care physician will handle any further medical issues. Please note that NO REFILLS for any discharge medications will be authorized once you are discharged, as it is imperative that you return to your primary care physician (or establish a relationship with a primary care physician if you do not have one) for your post hospital discharge needs so that they can reassess your need for medications and monitor your lab values.    Time coordinating discharge: 40 minutes  SIGNED:   Shelly Coss, MD  [facility] 06/29/2019, 9:06 AM Pager [phone]  If 7PM-7AM, please  contact night-coverage [email or web address] Password [email or web address]

## 2019-06-29 NOTE — Progress Notes (Signed)
Pt discharged to home. 1000 meds given prior to discharge. Wife, pt, & this RN walked to [employer] to send pt home.

## 2019-06-30 LAB — CULTURE, BLOOD (ROUTINE X 2)
Culture: NO GROWTH
Culture: NO GROWTH
Special Requests: ADEQUATE
Special Requests: ADEQUATE

## 2019-07-01 ENCOUNTER — Telehealth: Payer: Self-pay

## 2019-07-01 NOTE — Telephone Encounter (Signed)
Transition Care Management Follow-up Telephone Call   Date discharged?06/29/2019               How have you been since you were released from the hospital? feeling ok   Do you understand why you were in the hospital? Blood pressure   Do you understand the discharge instructions?yes   Where were you discharged to? home   Items Reviewed:  Medications reviewed: yes  Allergies reviewed: yes  Dietary changes reviewed: yes  Referrals reviewed: yes   Functional Questionnaire:   Activities of Daily Living (ADLs):  yes    Any transportation issues/concerns?: no   Any patient concerns? no   Confirmed importance and date/time of follow-up visits scheduled yes 07/10/2019 w/ Cherly Beach NP     Confirmed with patient if condition begins to worsen call PCP or go to the ER.  Patient was given the office number and encouraged to call back with question or concerns.  yes with verbal understanding

## 2019-07-10 ENCOUNTER — Ambulatory Visit (INDEPENDENT_AMBULATORY_CARE_PROVIDER_SITE_OTHER): Payer: BC Managed Care – PPO | Admitting: Family Medicine

## 2019-07-10 ENCOUNTER — Encounter: Payer: Self-pay | Admitting: Family Medicine

## 2019-07-10 ENCOUNTER — Other Ambulatory Visit: Payer: Self-pay

## 2019-07-10 VITALS — BP 180/100 | HR 72 | Temp 98.7°F | Resp 15 | Ht 69.0 in | Wt 199.1 lb

## 2019-07-10 DIAGNOSIS — F17200 Nicotine dependence, unspecified, uncomplicated: Secondary | ICD-10-CM | POA: Diagnosis not present

## 2019-07-10 DIAGNOSIS — Z7689 Persons encountering health services in other specified circumstances: Secondary | ICD-10-CM | POA: Diagnosis not present

## 2019-07-10 DIAGNOSIS — I10 Essential (primary) hypertension: Secondary | ICD-10-CM

## 2019-07-10 DIAGNOSIS — Z23 Encounter for immunization: Secondary | ICD-10-CM | POA: Diagnosis not present

## 2019-07-10 DIAGNOSIS — E669 Obesity, unspecified: Secondary | ICD-10-CM

## 2019-07-10 DIAGNOSIS — N184 Chronic kidney disease, stage 4 (severe): Secondary | ICD-10-CM

## 2019-07-10 MED ORDER — HYDRALAZINE HCL 100 MG PO TABS: 100.0000 mg | ORAL_TABLET | Freq: Three times a day (TID) | ORAL | 1 refills | Status: DC

## 2019-07-10 NOTE — Patient Instructions (Signed)
  I appreciate the opportunity to provide you with the care for your health and wellness. Today we discussed: recent hospital admission and blood pressure control  Follow up: 4 weeks for BP   No labs or referrals today  Keep your follow-up with the kidney doctor on October 26.  We will work together in collaboration to make sure that we can get your blood pressure as well-controlled as possible.  I have not drawn any labs today as she might have some lab that she would like to check.  If she does not draw any labs let us know and we will put in a lab for you to have your kidney function checked again to see how that is doing.  I have placed your hydralazine back at 100 mg to be taken 3 times a day 8 hours apart.  You currently have 50 mg at home.  You can take 2 of the 50 mg to equal to 100 mg until you see the kidney doctor to see if there is anything that she would like to adjust or change.  If you have any questions or concerns or are worried about any of the medications or changes that have come about please let us know.  Ultimately you were the one who is going to be in charge of making sure your medicines get taken. So make sure that you do everything in your power to ensure that these are taken. Hopefully the pill pocket and the patch will help with this.  We will see you in about 4 weeks.    Please continue to practice social distancing to keep you, your family, and our community safe.  If you must go out, please wear a mask and practice good handwashing.  It was a pleasure to see you and I look forward to continuing to work together on your health and well-being. Please do not hesitate to call the office if you need care or have questions about your care.  Have a wonderful day and week. With Gratitude, Cherly Beach, DNP, AGNP-BC

## 2019-07-10 NOTE — Progress Notes (Signed)
Subjective:     Patient ID: [name], male   DOB: Apr 15, 1971, 48 y.o.   MRN: [id number]  [name] presents for Transitions Of Care  [name] is a 48 year old male history with a history of chronic back pain, hypertension, GI bleed, GERD, hyperlipidemia, migraines, obesity, sleep apnea who was recently admitted on October 5 secondary to hypertensive urgency, nausea, vomiting over [facility] emergency room.  He was transferred to [facility] for MRI of brain which did not show any acute intracranial changes.  He was found to have an elevated lactic acid, acute kidney injury at admission.  He was admitted to the ICU for management.  Nephrology was consulted for the AKI.  Blood pressure medicines were adjusted.  Kidney function had improved and blood pressure had improved at time of discharge.  He also had an appointment for [facility] on October 26.  Currently on amlodipine, clonidine patch, metoprolol, started on hydralazine.  Reports that he has a hard time keeping up with his medications especially taking them every 8 hours.  Clonidine patch was started he reports that he thinks that this is getting better for him.  As he does not have to keep up with so many medications.  Still needs to take hydralazine every 8 hours.  Reports that he has been doing this since being discharged.  Blood pressure still elevated today.  Was being followed by Dr. Lowanda Foster in [place] and is now going to be followed by [facility] in [place].  Kidney function was close to baseline at discharge.  He reports that he will be following up with [facility] and is wanting to make sure that he keeps his much function as he can.  Overall today he is very jovial and good with communication.  Reports that he wants to take better care of himself.  Is not currently checking blood pressure at home.  Reports that he is trying to work on taking his medication as directed.  Has nephrology  appointment coming up.  Today patient denies signs and symptoms of COVID 19 infection including fever, chills, cough, shortness of breath, and headache.  Past Medical, Surgical, Social History, Allergies, and Medications have been Reviewed. Past Medical History:  Diagnosis Date  . Chronic back pain   . Duodenitis with bleeding 08/2008   Ulcer - admitted to [facility]   . Essential hypertension, benign   . GERD (gastroesophageal reflux disease)   . Headache(784.0)   . Hyperlipidemia   . Migraines   . Obesity, unspecified   . Sleep apnea    Past Surgical History:  Procedure Laterality Date  . 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 CHOLECYSTECTOMY N/A 10/03/2018   Procedure: LAPAROSCOPIC CHOLECYSTECTOMY;  Surgeon: Aviva Signs, MD;  Location: [facility];  Service: General;  Laterality: N/A;  . FINGER DEBRIDEMENT Left 09/25/2013   Thumb  . FOOT FRACTURE SURGERY Right    Social History   Socioeconomic History  . Marital status: Married    Spouse name: Not on file  . Number of children: 4  . Years of education: Not on file  . Highest education level: Not on file  Occupational History  . Occupation: Musician     Comment: [employer]  Social Needs  . Financial resource strain: Not very hard  .  Food insecurity    Worry: Never true    Inability: Never true  . Transportation needs    Medical: No    Non-medical: No  Tobacco Use  . Smoking status: Former Smoker    Packs/day: 0.10    Years: 15.00    Pack years: 1.50    Types: Cigarettes    Quit date: 09/15/2012    Years since quitting: 6.8  . Smokeless tobacco: Never Used  Substance and Sexual Activity  . Alcohol use: No  . Drug use: No  . Sexual activity: Yes  Lifestyle  . Physical activity    Days per week: Patient refused    Minutes  per session: Patient refused  . Stress: Not on file  Relationships  . Social Herbalist on phone: Patient refused    Gets together: Patient refused    Attends religious service: Patient refused    Active member of club or organization: Patient refused    Attends meetings of clubs or organizations: Patient refused    Relationship status: Patient refused  . Intimate partner violence    Fear of current or ex partner: No    Emotionally abused: No    Physically abused: No    Forced sexual activity: No  Other Topics Concern  . Not on file  Social History Narrative  . Not on file    Outpatient Encounter Medications as of 07/10/2019  Medication Sig  . amLODipine (NORVASC) 10 MG tablet Take 1 tablet (10 mg total) by mouth daily.  . cloNIDine (CATAPRES - DOSED IN MG/24 HR) 0.3 mg/24hr patch Place 1 patch (0.3 mg total) onto the skin once a week.  . esomeprazole (NEXIUM) 20 MG capsule Take 20 mg by mouth daily at 12 noon.  . hydrALAZINE (APRESOLINE) 50 MG tablet Take 1 tablet (50 mg total) by mouth 3 (three) times daily.  . hydrOXYzine (VISTARIL) 50 MG capsule Take 50 mg by mouth 2 (two) times daily as needed for anxiety or itching.   . metoprolol tartrate (LOPRESSOR) 25 MG tablet Take 1 tablet (25 mg total) by mouth 2 (two) times daily.  . ondansetron (ZOFRAN) 8 MG tablet Take 1 tablet (8 mg total) by mouth every 4 (four) hours as needed. (Patient taking differently: Take 8 mg by mouth every 4 (four) hours as needed for nausea or vomiting. )   No facility-administered encounter medications on file as of 07/10/2019.    Allergies  Allergen Reactions  . Bee Venom Anaphylaxis  . Prochlorperazine Edisylate Other (See Comments)    Reaction: Jittery,uncomfortable feeling  . Cyclobenzaprine Hcl Other (See Comments)    REACTION: feel anxious, nausea  . Aleve [Naproxen Sodium] Nausea And Vomiting    States that this medication also causes abdominal pain  . Carbamazepine     REACTION:  unknown reaction  . Geodon [Ziprasidone Hcl] Nausea And Vomiting  . Ibuprofen Nausea And Vomiting    States that this medication also causes abdominal pain  . Maxzide [Hydrochlorothiazide W-Triamterene]     headache  . Nsaids Other (See Comments)    H/o severe anemia from UGIB from ulcer  . Tylenol [Acetaminophen] Nausea And Vomiting    Patient states that this medication also causes abdominal pain  . Other Nausea And Vomiting, Rash and Other (See Comments)    ALL MUSCLE RELAXANTS: states that they affect sciatic nerve, may cause nausea and vomiting, rash    Review of Systems  Constitutional: Negative for chills and fever.  HENT:  Negative.   Eyes: Negative.  Negative for visual disturbance.  Respiratory: Negative.  Negative for cough and shortness of breath.   Cardiovascular: Negative.  Negative for chest pain, palpitations and leg swelling.  Gastrointestinal: Negative.   Endocrine: Negative.   Genitourinary: Negative.   Musculoskeletal: Negative.   Skin: Negative.   Allergic/Immunologic: Negative.   Neurological: Negative.   Hematological: Negative.   Psychiatric/Behavioral: Negative.   All other systems reviewed and are negative.      Objective:     BP (!) 180/100   Pulse 72   Temp 98.7 F (37.1 C) (Temporal)   Resp 15   Ht 5\' 9"  (1.753 m)   Wt 199 lb 1.9 oz (90.3 kg)   SpO2 97%   BMI 29.40 kg/m   Physical Exam Vitals signs and nursing note reviewed.  Constitutional:      Appearance: Normal appearance. He is well-developed, well-groomed and overweight.  HENT:     Head: Normocephalic and atraumatic.     Right Ear: External ear normal.     Left Ear: External ear normal.     Nose: Nose normal.     Mouth/Throat:     Mouth: Mucous membranes are moist.     Pharynx: Oropharynx is clear.  Eyes:     General:        Right eye: No discharge.        Left eye: No discharge.     Conjunctiva/sclera: Conjunctivae normal.  Neck:     Musculoskeletal: Normal range of  motion and neck supple.  Cardiovascular:     Rate and Rhythm: Normal rate and regular rhythm.     Pulses: Normal pulses.     Heart sounds: Normal heart sounds.  Pulmonary:     Effort: Pulmonary effort is normal.     Breath sounds: Normal breath sounds.  Musculoskeletal: Normal range of motion.  Skin:    General: Skin is warm.  Neurological:     General: No focal deficit present.     Mental Status: He is alert and oriented to person, place, and time.  Psychiatric:        Attention and Perception: Attention normal.        Mood and Affect: Mood normal.        Speech: Speech normal.        Behavior: Behavior normal. Behavior is cooperative.        Thought Content: Thought content normal.        Cognition and Memory: Cognition normal.        Judgment: Judgment normal.        Assessment and Plan        1. Encounter for support and coordination of transition of care As documented above.  Encouraged follow-up with his kidney doctor in October 26.  Encouraged to maintain a heart healthy low-fat diet.  Encouraged to stop smoking.  Encouraged to make sure that he is taking all of his medications as directed.  And educated again on the impact of high blood pressure and kidney function and vice versa.  2. CKD (chronic kidney disease) stage 4, GFR 15-29 ml/min (HCC) Has follow-up with [facility] on October 26.  Appreciate collaboration in his care.  Please let PCP know if there is anything that we can do on our end.   3. Malignant hypertension [name] is encouraged to maintain a well balanced diet that is low in salt. NOT Controlled, continue current medication regimen, encouraged not to miss  any doses and to take as directed.  Refills provided. Somehow he was changed to hydralazine 50 mg.  This was increased back to 100 mg a day.  Additionally educated to make sure that he knows that his blood pressure can impact his kidney function.   Additionally, he is also reminded that  exercise is beneficial for heart health and control of  Blood pressure. 30-60 minutes daily is recommended-walking was suggested.   - hydrALAZINE (APRESOLINE) 100 MG tablet; Take 1 tablet (100 mg total) by mouth 3 (three) times daily.  Dispense: 90 tablet; Refill: 1  4. Current smoker Asked about quitting: confirms they are currently smokes cigarettes Advise to quit smoking: Educated about QUITTING to reduce the risk of cancer, cardio and cerebrovascular disease. Assess willingness: Unwilling to quit at this time, but is working on cutting back. Assist with counseling and pharmacotherapy: Counseled for 5 minutes and literature provided. Arrange for follow up:  not quitting follow up in 3 months and continue to offer help.    5. Obesity (BMI 30.0-34.9)  Improved  [name] is re-educated about the importance of exercise daily to help with weight management. A minumum of 30 minutes daily is recommended. Additionally, importance of healthy food choices  with portion control discussed.   Wt Readings from Last 3 Encounters:  07/10/19 199 lb 1.9 oz (90.3 kg)  06/29/19 189 lb 13.1 oz (86.1 kg)  05/20/19 230 lb (104.3 kg)    6. Need for immunization against influenza Patient was educated on the recommendation for flu vaccine. After obtaining informed consent, the vaccine was administered no adverse effects noted at time of administration. Patient provided with education on arm soreness and use of tylenol or ibuprofen (if safe) for this. Encourage to use the arm vaccine was given in to help reduce the soreness. Patient educated on the signs of a reaction to the vaccine and advised to contact the office should these occur.    - Flu Vaccine QUAD 36+ mos IM    Follow Up: 08/07/2019  Perlie Mayo, DNP, AGNP-BC [place], [place], [place] [address] Office Hours: Mon-Thurs 8 am-5 pm; Fri 8 am-12 pm Office Phone:   [phone]  Office Fax: [phone]

## 2019-07-19 DIAGNOSIS — N184 Chronic kidney disease, stage 4 (severe): Secondary | ICD-10-CM | POA: Diagnosis not present

## 2019-07-24 DIAGNOSIS — N184 Chronic kidney disease, stage 4 (severe): Secondary | ICD-10-CM | POA: Diagnosis not present

## 2019-07-24 DIAGNOSIS — I129 Hypertensive chronic kidney disease with stage 1 through stage 4 chronic kidney disease, or unspecified chronic kidney disease: Secondary | ICD-10-CM | POA: Diagnosis not present

## 2019-08-01 ENCOUNTER — Other Ambulatory Visit: Payer: Self-pay

## 2019-08-01 ENCOUNTER — Telehealth: Payer: Self-pay | Admitting: *Deleted

## 2019-08-01 MED ORDER — CLONIDINE 0.3 MG/24HR TD PTWK: 0.3000 mg | MEDICATED_PATCH | TRANSDERMAL | 0 refills | Status: DC

## 2019-08-01 NOTE — Telephone Encounter (Signed)
Patches refilled

## 2019-08-01 NOTE — Telephone Encounter (Signed)
Pt needs clonidine sent to [facility] on [address] he is on his last patch

## 2019-08-05 ENCOUNTER — Encounter: Payer: Self-pay | Admitting: Family Medicine

## 2019-08-05 ENCOUNTER — Other Ambulatory Visit: Payer: Self-pay

## 2019-08-05 MED ORDER — CLONIDINE 0.3 MG/24HR TD PTWK: 0.3000 mg | MEDICATED_PATCH | TRANSDERMAL | 0 refills | Status: DC

## 2019-08-07 ENCOUNTER — Ambulatory Visit: Payer: BC Managed Care – PPO | Admitting: Family Medicine

## 2019-08-09 ENCOUNTER — Ambulatory Visit: Payer: BC Managed Care – PPO | Admitting: Podiatry

## 2019-08-13 ENCOUNTER — Ambulatory Visit (INDEPENDENT_AMBULATORY_CARE_PROVIDER_SITE_OTHER): Payer: BC Managed Care – PPO | Admitting: Family Medicine

## 2019-08-13 ENCOUNTER — Encounter: Payer: Self-pay | Admitting: Family Medicine

## 2019-08-13 ENCOUNTER — Other Ambulatory Visit: Payer: Self-pay

## 2019-08-13 VITALS — BP 170/110 | HR 75 | Temp 98.6°F | Resp 15 | Ht 69.0 in | Wt 195.1 lb

## 2019-08-13 DIAGNOSIS — N184 Chronic kidney disease, stage 4 (severe): Secondary | ICD-10-CM

## 2019-08-13 DIAGNOSIS — I150 Renovascular hypertension: Secondary | ICD-10-CM | POA: Diagnosis not present

## 2019-08-13 DIAGNOSIS — G43809 Other migraine, not intractable, without status migrainosus: Secondary | ICD-10-CM

## 2019-08-13 NOTE — Patient Instructions (Signed)
  I appreciate the opportunity to provide you with care for your health and wellness. Today we discussed:  Blood pressure  Follow up:  3 month   No labs   Pain referral placed  BP today: 174/113 automatic; 170/110 manual  Send new medication by MyChart so we can add to list.  I hope you have a wonderful, happy, safe, and healthy Holiday Season! See you in the New Year :)  Please continue to practice social distancing to keep you, your family, and our community safe.  If you must go out, please wear a mask and practice good handwashing.  It was a pleasure to see you and I look forward to continuing to work together on your health and well-being. Please do not hesitate to call the office if you need care or have questions about your care.  Have a wonderful day and week. With Gratitude, Cherly Beach, DNP, AGNP-BC

## 2019-08-13 NOTE — Progress Notes (Signed)
Subjective:     Patient ID: [name], male   DOB: 1971-01-26, 48 y.o.   MRN: [id number]  [name] presents for Hypertension (BP recheck)  Here for follow-up of hypertension. Reports taking medications as directed.  Is being followed by [facility] in [place] now. Is not currently doing any form of exercise has recently been sick and lost a few pounds related to not feeling well and feeling like eating.  Due to upset stomach.  Is trying to work on diet control.  Blood pressure at home has seen better control with the best number being 165/98.  He reports a change in medications but he cannot remember the name of the medication reports that he will call the office or send a message via MyChart to update [place] so that we can change the medication list.  Denies having any chest pain, leg swelling shortness of breath, cough, vision changes, dizziness.  Does report having some headaches and migraine issues would like to get a pain clinic referral to help get that under control. Reports occasional palpitation like feelings where he feels like his breath taken away or thumping in his chest.  Has been noted to have PACs in the past.  Reports that he can tell a difference in his blood pressure is much more controlled.  But he is still waking up with headaches.  Today patient denies signs and symptoms of COVID 19 infection including fever, chills, cough, shortness of breath, and headache.  Past Medical, Surgical, Social History, Allergies, and Medications have been Reviewed.   Past Medical History:  Diagnosis Date  . Chronic back pain   . Duodenitis with bleeding 08/2008   Ulcer - admitted to [facility]   . Essential hypertension, benign   . GERD (gastroesophageal reflux disease)   . Headache(784.0)   . Hyperlipidemia   . Migraines   . Obesity, unspecified   . Sleep apnea    Past Surgical History:  Procedure Laterality Date  . 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   Procedure:  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.;  Surgeon: Jolyn Nap, MD;  Location: [facility];  Service: Orthopedics;  Laterality: Left;  . CHOLECYSTECTOMY N/A 10/03/2018   Procedure: LAPAROSCOPIC CHOLECYSTECTOMY;  Surgeon: Aviva Signs, MD;  Location: [facility];  Service: General;  Laterality: N/A;  . FINGER DEBRIDEMENT Left 09/25/2013   Thumb  . FOOT FRACTURE SURGERY Right    Social History   Socioeconomic History  . Marital status: Married    Spouse name: Not on file  . Number of children: 4  . Years of education: Not on file  . Highest education level: Not on file  Occupational History  . Occupation: Musician     Comment: [employer]  Social Needs  . Financial resource strain: Not very hard  . Food insecurity    Worry: Never true    Inability: Never true  . Transportation needs    Medical: No    Non-medical: No  Tobacco Use  . Smoking status: Former Smoker    Packs/day: 0.10    Years: 15.00    Pack years: 1.50    Types: Cigarettes    Quit date: 09/15/2012    Years since quitting: 6.9  . Smokeless tobacco: Never Used  Substance and Sexual Activity  . Alcohol use: No  . Drug use: No  . Sexual activity: Yes  Lifestyle  . Physical activity    Days per  week: Patient refused    Minutes per session: Patient refused  . Stress: Not on file  Relationships  . Social Herbalist on phone: Patient refused    Gets together: Patient refused    Attends religious service: Patient refused    Active member of club or organization: Patient refused    Attends meetings of clubs or organizations: Patient refused    Relationship status: Patient refused  . Intimate partner violence    Fear of current or ex partner: No    Emotionally abused: No    Physically abused: No    Forced sexual activity: No  Other Topics Concern  . Not on file  Social History Narrative  . Not on file    Outpatient Encounter Medications as of  08/13/2019  Medication Sig  . amLODipine (NORVASC) 10 MG tablet Take 1 tablet (10 mg total) by mouth daily.  . cloNIDine (CATAPRES - DOSED IN MG/24 HR) 0.3 mg/24hr patch Place 1 patch (0.3 mg total) onto the skin once a week.  . esomeprazole (NEXIUM) 20 MG capsule Take 20 mg by mouth daily at 12 noon.  . hydrALAZINE (APRESOLINE) 100 MG tablet Take 1 tablet (100 mg total) by mouth 3 (three) times daily.  . hydrOXYzine (VISTARIL) 50 MG capsule Take 50 mg by mouth 2 (two) times daily as needed for anxiety or itching.   . metoprolol tartrate (LOPRESSOR) 25 MG tablet Take 1 tablet (25 mg total) by mouth 2 (two) times daily.  . ondansetron (ZOFRAN) 8 MG tablet Take 1 tablet (8 mg total) by mouth every 4 (four) hours as needed. (Patient taking differently: Take 8 mg by mouth every 4 (four) hours as needed for nausea or vomiting. )   No facility-administered encounter medications on file as of 08/13/2019.    Allergies  Allergen Reactions  . Bee Venom Anaphylaxis  . Prochlorperazine Edisylate Other (See Comments)    Reaction: Jittery,uncomfortable feeling  . Cyclobenzaprine Hcl Other (See Comments)    REACTION: feel anxious, nausea  . Aleve [Naproxen Sodium] Nausea And Vomiting    States that this medication also causes abdominal pain  . Carbamazepine     REACTION: unknown reaction  . Geodon [Ziprasidone Hcl] Nausea And Vomiting  . Ibuprofen Nausea And Vomiting    States that this medication also causes abdominal pain  . Maxzide [Hydrochlorothiazide W-Triamterene]     headache  . Nsaids Other (See Comments)    H/o severe anemia from UGIB from ulcer  . Tylenol [Acetaminophen] Nausea And Vomiting    Patient states that this medication also causes abdominal pain  . Other Nausea And Vomiting, Rash and Other (See Comments)    ALL MUSCLE RELAXANTS: states that they affect sciatic nerve, may cause nausea and vomiting, rash    Review of Systems  Constitutional: Negative for chills and fever.   HENT: Negative.   Eyes: Negative.   Respiratory: Negative.   Cardiovascular: Negative.   Gastrointestinal: Negative.   Endocrine: Negative.   Genitourinary: Negative.   Musculoskeletal: Negative.   Skin: Negative.   Allergic/Immunologic: Negative.   Neurological: Positive for headaches.  Hematological: Negative.   Psychiatric/Behavioral: Negative.   All other systems reviewed and are negative.      Objective:     BP (!) 174/113   Pulse 75   Temp 98.6 F (37 C) (Oral)   Resp 15   Ht 5\' 9"  (1.753 m)   Wt 195 lb 1.3 oz (88.5 kg)   SpO2 97%  BMI 28.81 kg/m   Physical Exam Vitals signs and nursing note reviewed.  Constitutional:      Appearance: Normal appearance. He is well-developed, well-groomed and overweight.  HENT:     Head: Normocephalic and atraumatic.     Right Ear: External ear normal.     Left Ear: External ear normal.     Nose: Nose normal.     Mouth/Throat:     Mouth: Mucous membranes are moist.     Pharynx: Oropharynx is clear.  Eyes:     General:        Right eye: No discharge.        Left eye: No discharge.     Conjunctiva/sclera: Conjunctivae normal.  Neck:     Musculoskeletal: Normal range of motion and neck supple.  Cardiovascular:     Rate and Rhythm: Normal rate and regular rhythm.     Pulses: Normal pulses.     Heart sounds: Normal heart sounds.  Pulmonary:     Effort: Pulmonary effort is normal.     Breath sounds: Normal breath sounds.  Musculoskeletal: Normal range of motion.  Skin:    General: Skin is warm.  Neurological:     General: No focal deficit present.     Mental Status: He is alert and oriented to person, place, and time.  Psychiatric:        Attention and Perception: Attention normal.        Mood and Affect: Mood normal.        Speech: Speech normal.        Behavior: Behavior normal. Behavior is cooperative.        Thought Content: Thought content normal.        Cognition and Memory: Cognition normal.         Judgment: Judgment normal.        Assessment and Plan       1. CKD (chronic kidney disease) stage 4, GFR 15-29 ml/min (HCC) Ongoing continued kidney disease secondary to elevated blood pressure as well.  Is being followed by [facility] Dr. Lyda Kalata, she has adjusted his medications will be following her advice and how to manage blood pressure with kidney involvement.  Please let us know if PCP can be of any service.  2. Renovascular hypertension Is being followed by [facility] now.  Had an adjustment of medications he is not sure of the current medication will be reporting this back to [place] via my chart and will update his medication list.  Given that she is already adjusted his medications will not be adjusting medications at this time.  We really appreciate [facility]'s help with maintaining his blood pressure and trying to keep him in range and keep his kidneys healthy.  3. Other migraine without status migrainosus, not intractable Would like a referral to a different pain clinic. Reports he was doing okay before some medications changed, rather be back on previous ones. Reports daily headaches, that are compounded with BP issues.   - Ambulatory referral to Pain Clinic  Follow-up: 3 months   Perlie Mayo, DNP, AGNP-BC [address] [place], [place] [address] Office Hours: Mon-Thurs 8 am-5 pm; Fri 8 am-12 pm Office Phone:  [phone]  Office Fax: [phone]

## 2019-09-16 ENCOUNTER — Other Ambulatory Visit: Payer: Self-pay

## 2019-09-16 ENCOUNTER — Encounter: Payer: Self-pay | Admitting: Family Medicine

## 2019-09-16 MED ORDER — CLONIDINE 0.3 MG/24HR TD PTWK: 0.3000 mg | MEDICATED_PATCH | TRANSDERMAL | 0 refills | Status: DC

## 2019-09-16 MED ORDER — AMLODIPINE BESYLATE 10 MG PO TABS: 10.0000 mg | ORAL_TABLET | Freq: Every day | ORAL | 3 refills | Status: DC

## 2019-10-08 ENCOUNTER — Observation Stay (HOSPITAL_COMMUNITY)
Admission: EM | Admit: 2019-10-08 | Discharge: 2019-10-09 | Disposition: A | Discharge status: Home / Self Care | Payer: BC Managed Care – PPO | Attending: Family Medicine | Admitting: Family Medicine

## 2019-10-08 ENCOUNTER — Emergency Department (HOSPITAL_COMMUNITY): Payer: BC Managed Care – PPO

## 2019-10-08 ENCOUNTER — Encounter (HOSPITAL_COMMUNITY): Payer: Self-pay

## 2019-10-08 ENCOUNTER — Other Ambulatory Visit: Payer: Self-pay

## 2019-10-08 DIAGNOSIS — R1110 Vomiting, unspecified: Secondary | ICD-10-CM

## 2019-10-08 DIAGNOSIS — R5383 Other fatigue: Secondary | ICD-10-CM | POA: Insufficient documentation

## 2019-10-08 DIAGNOSIS — Z886 Allergy status to analgesic agent status: Secondary | ICD-10-CM | POA: Diagnosis not present

## 2019-10-08 DIAGNOSIS — I10 Essential (primary) hypertension: Secondary | ICD-10-CM | POA: Diagnosis present

## 2019-10-08 DIAGNOSIS — Z888 Allergy status to other drugs, medicaments and biological substances status: Secondary | ICD-10-CM | POA: Insufficient documentation

## 2019-10-08 DIAGNOSIS — N184 Chronic kidney disease, stage 4 (severe): Secondary | ICD-10-CM | POA: Diagnosis not present

## 2019-10-08 DIAGNOSIS — Z87891 Personal history of nicotine dependence: Secondary | ICD-10-CM | POA: Insufficient documentation

## 2019-10-08 DIAGNOSIS — R112 Nausea with vomiting, unspecified: Secondary | ICD-10-CM | POA: Diagnosis not present

## 2019-10-08 DIAGNOSIS — K219 Gastro-esophageal reflux disease without esophagitis: Secondary | ICD-10-CM | POA: Diagnosis not present

## 2019-10-08 DIAGNOSIS — Z91030 Bee allergy status: Secondary | ICD-10-CM | POA: Insufficient documentation

## 2019-10-08 DIAGNOSIS — Z20822 Contact with and (suspected) exposure to covid-19: Secondary | ICD-10-CM | POA: Insufficient documentation

## 2019-10-08 DIAGNOSIS — E785 Hyperlipidemia, unspecified: Secondary | ICD-10-CM | POA: Insufficient documentation

## 2019-10-08 DIAGNOSIS — G43909 Migraine, unspecified, not intractable, without status migrainosus: Secondary | ICD-10-CM | POA: Diagnosis not present

## 2019-10-08 DIAGNOSIS — I43 Cardiomyopathy in diseases classified elsewhere: Secondary | ICD-10-CM | POA: Diagnosis present

## 2019-10-08 DIAGNOSIS — I5089 Other heart failure: Secondary | ICD-10-CM | POA: Insufficient documentation

## 2019-10-08 DIAGNOSIS — R519 Headache, unspecified: Secondary | ICD-10-CM

## 2019-10-08 DIAGNOSIS — I130 Hypertensive heart and chronic kidney disease with heart failure and stage 1 through stage 4 chronic kidney disease, or unspecified chronic kidney disease: Principal | ICD-10-CM | POA: Insufficient documentation

## 2019-10-08 DIAGNOSIS — I1A0 Resistant hypertension: Secondary | ICD-10-CM | POA: Diagnosis present

## 2019-10-08 DIAGNOSIS — G479 Sleep disorder, unspecified: Secondary | ICD-10-CM | POA: Diagnosis present

## 2019-10-08 DIAGNOSIS — Z79899 Other long term (current) drug therapy: Secondary | ICD-10-CM | POA: Diagnosis not present

## 2019-10-08 DIAGNOSIS — I160 Hypertensive urgency: Secondary | ICD-10-CM

## 2019-10-08 DIAGNOSIS — G4733 Obstructive sleep apnea (adult) (pediatric): Secondary | ICD-10-CM | POA: Diagnosis present

## 2019-10-08 DIAGNOSIS — I119 Hypertensive heart disease without heart failure: Secondary | ICD-10-CM | POA: Diagnosis present

## 2019-10-08 HISTORY — DX: Hypertensive urgency: I16.0

## 2019-10-08 LAB — COMPREHENSIVE METABOLIC PANEL
ALT: 13 U/L (ref 0–44)
AST: 19 U/L (ref 15–41)
Albumin: 4.7 g/dL (ref 3.5–5.0)
Alkaline Phosphatase: 73 U/L (ref 38–126)
Anion gap: 10 (ref 5–15)
BUN: 35 mg/dL — ABNORMAL HIGH (ref 6–20)
CO2: 22 mmol/L (ref 22–32)
Calcium: 9.1 mg/dL (ref 8.9–10.3)
Chloride: 108 mmol/L (ref 98–111)
Creatinine, Ser: 2.92 mg/dL — ABNORMAL HIGH (ref 0.61–1.24)
GFR calc Af Amer: 28 mL/min — ABNORMAL LOW (ref >60)
GFR calc non Af Amer: 24 mL/min — ABNORMAL LOW (ref >60)
Glucose, Bld: 128 mg/dL — ABNORMAL HIGH (ref 70–99)
Potassium: 4 mmol/L (ref 3.5–5.1)
Sodium: 140 mmol/L (ref 135–145)
Total Bilirubin: 0.8 mg/dL (ref 0.3–1.2)
Total Protein: 7.8 g/dL (ref 6.5–8.1)

## 2019-10-08 LAB — LIPASE, BLOOD: Lipase: 139 U/L — ABNORMAL HIGH (ref 11–51)

## 2019-10-08 LAB — CBC WITH DIFFERENTIAL/PLATELET
Abs Immature Granulocytes: 0.03 10*3/uL (ref 0.00–0.07)
Basophils Absolute: 0 10*3/uL (ref 0.0–0.1)
Basophils Relative: 0 %
Eosinophils Absolute: 0 10*3/uL (ref 0.0–0.5)
Eosinophils Relative: 0 %
HCT: 36.7 % — ABNORMAL LOW (ref 39.0–52.0)
Hemoglobin: 12.8 g/dL — ABNORMAL LOW (ref 13.0–17.0)
Immature Granulocytes: 0 %
Lymphocytes Relative: 9 %
Lymphs Abs: 0.7 10*3/uL (ref 0.7–4.0)
MCH: 33.1 pg (ref 26.0–34.0)
MCHC: 34.9 g/dL (ref 30.0–36.0)
MCV: 94.8 fL (ref 80.0–100.0)
Monocytes Absolute: 0.2 10*3/uL (ref 0.1–1.0)
Monocytes Relative: 3 %
Neutro Abs: 7 10*3/uL (ref 1.7–7.7)
Neutrophils Relative %: 88 %
Platelets: 291 10*3/uL (ref 150–400)
RBC: 3.87 MIL/uL — ABNORMAL LOW (ref 4.22–5.81)
RDW: 10.8 % — ABNORMAL LOW (ref 11.5–15.5)
WBC: 8 10*3/uL (ref 4.0–10.5)
nRBC: 0 % (ref 0.0–0.2)

## 2019-10-08 LAB — POC SARS CORONAVIRUS 2 AG -  ED: SARS Coronavirus 2 Ag: NEGATIVE

## 2019-10-08 LAB — TROPONIN I (HIGH SENSITIVITY)
Troponin I (High Sensitivity): 23 ng/L — ABNORMAL HIGH (ref <18)
Troponin I (High Sensitivity): 21 ng/L — ABNORMAL HIGH (ref <18)

## 2019-10-08 LAB — SARS CORONAVIRUS 2 (TAT 6-24 HRS): SARS Coronavirus 2: NEGATIVE

## 2019-10-08 MED ORDER — MORPHINE SULFATE (PF) 2 MG/ML IV SOLN: 1.0000 mg | INTRAVENOUS | Status: DC | PRN

## 2019-10-08 MED ORDER — HEPARIN SODIUM (PORCINE) 5000 UNIT/ML IJ SOLN
5000.0000 [IU] | Freq: Three times a day (TID) | INTRAMUSCULAR | Status: DC
  Administered 2019-10-08 – 2019-10-09 (×3): 5000 [IU] via SUBCUTANEOUS
  Filled 2019-10-08 (×4): qty 1

## 2019-10-08 MED ORDER — ONDANSETRON HCL 4 MG/2ML IJ SOLN: 4.0000 mg | Freq: Four times a day (QID) | INTRAMUSCULAR | Status: DC | PRN

## 2019-10-08 MED ORDER — CLONIDINE HCL 0.2 MG/24HR TD PTWK: 0.3000 mg | MEDICATED_PATCH | TRANSDERMAL | Status: DC

## 2019-10-08 MED ORDER — PROMETHAZINE HCL 25 MG/ML IJ SOLN
25.0000 mg | Freq: Once | INTRAMUSCULAR | Status: AC
  Administered 2019-10-08: 25 mg via INTRAVENOUS
  Filled 2019-10-08: qty 1

## 2019-10-08 MED ORDER — PANTOPRAZOLE SODIUM 40 MG IV SOLR
40.0000 mg | INTRAVENOUS | Status: DC
  Administered 2019-10-08 – 2019-10-09 (×2): 40 mg via INTRAVENOUS
  Filled 2019-10-08 (×2): qty 40

## 2019-10-08 MED ORDER — AMLODIPINE BESYLATE 5 MG PO TABS
10.0000 mg | ORAL_TABLET | Freq: Every day | ORAL | Status: DC
  Administered 2019-10-08 – 2019-10-09 (×2): 10 mg via ORAL
  Filled 2019-10-08 (×2): qty 2

## 2019-10-08 MED ORDER — METOPROLOL TARTRATE 25 MG PO TABS
25.0000 mg | ORAL_TABLET | Freq: Two times a day (BID) | ORAL | Status: DC
  Administered 2019-10-08 – 2019-10-09 (×3): 25 mg via ORAL
  Filled 2019-10-08 (×3): qty 1

## 2019-10-08 MED ORDER — DIPHENHYDRAMINE HCL 50 MG/ML IJ SOLN
25.0000 mg | Freq: Once | INTRAMUSCULAR | Status: AC
  Administered 2019-10-08: 25 mg via INTRAVENOUS
  Filled 2019-10-08: qty 1

## 2019-10-08 MED ORDER — LABETALOL HCL 5 MG/ML IV SOLN
10.0000 mg | INTRAVENOUS | Status: DC | PRN
  Administered 2019-10-08 (×3): 10 mg via INTRAVENOUS
  Filled 2019-10-08 (×3): qty 4

## 2019-10-08 MED ORDER — LABETALOL HCL 5 MG/ML IV SOLN
10.0000 mg | Freq: Once | INTRAVENOUS | Status: AC
  Administered 2019-10-08: 10 mg via INTRAVENOUS
  Filled 2019-10-08: qty 4

## 2019-10-08 MED ORDER — METOCLOPRAMIDE HCL 5 MG/ML IJ SOLN
10.0000 mg | Freq: Once | INTRAMUSCULAR | Status: AC
  Administered 2019-10-08: 10 mg via INTRAVENOUS
  Filled 2019-10-08: qty 2

## 2019-10-08 MED ORDER — ONDANSETRON HCL 4 MG PO TABS
4.0000 mg | ORAL_TABLET | Freq: Four times a day (QID) | ORAL | Status: DC | PRN
  Administered 2019-10-09: 4 mg via ORAL
  Filled 2019-10-08: qty 1

## 2019-10-08 MED ORDER — LACTATED RINGERS IV SOLN: INTRAVENOUS | Status: DC

## 2019-10-08 MED ORDER — HYDROXYZINE PAMOATE 25 MG PO CAPS
50.0000 mg | ORAL_CAPSULE | Freq: Two times a day (BID) | ORAL | Status: DC | PRN
  Filled 2019-10-08: qty 1

## 2019-10-08 MED ORDER — SODIUM CHLORIDE 0.9 % IV BOLUS
500.0000 mL | Freq: Once | INTRAVENOUS | Status: AC
  Administered 2019-10-08: 500 mL via INTRAVENOUS

## 2019-10-08 MED ORDER — OXYCODONE HCL 5 MG PO TABS
5.0000 mg | ORAL_TABLET | ORAL | Status: DC | PRN
  Administered 2019-10-08 – 2019-10-09 (×5): 5 mg via ORAL
  Filled 2019-10-08 (×5): qty 1

## 2019-10-08 NOTE — ED Provider Notes (Signed)
[facility] Provider Note   CSN: [id number] Arrival date & time: 10/08/19  1011     History Chief Complaint  Patient presents with  . Hypertension    [name] is a 49 y.o. male with a hx of migraine headache, hypertension, chronic kidney disease, hyperlipidemia, hypertensive cardiomyopathy, GERD presents to the Emergency Department complaining of gradual, persistent, progressively worsening nausea and vomiting onset around 9:30 AM after waking.  Patient reports after his first episode of vomiting he developed a sudden onset headache.  He reports his headache is similar to previous migraine headaches.  Patient reports associated photophobia but denies vision changes.  He denies thunderclap headache or worst headache of his life.  He denies measured fever but does report chills and fatigue.  He denies known Covid contacts.  Patient reports he has not taken his hypertension medications this morning.  He reports baseline blood pressure is usually [id number] systolic.  Patient medications include Norvasc, clonidine patch, Lopressor and Aldactone.  Records also indicate patient was admitted in October 2020 for malignant hypertension in the setting of altered mental status, headache, vomiting and acute kidney injury.  Patient reports he takes hydrocodone at home for his migraines.  He did not attempt to take this medication this morning.   The history is provided by the patient and medical records. No language interpreter was used.       Past Medical History:  Diagnosis Date  . Chronic back pain   . Duodenitis with bleeding 08/2008   Ulcer - admitted to [facility]   . Essential hypertension, benign   . GERD (gastroesophageal reflux disease)   . Headache(784.0)   . Hyperlipidemia   . Migraines   . Obesity, unspecified   . Sleep apnea     Patient Active Problem List   Diagnosis Date Noted  . Hypertensive urgency 10/08/2019  . Acute kidney injury ([place])   .  Sepsis due to undetermined organism ([place])   . Hypertension 06/25/2019  . Atrial premature depolarization   . Depression, major, single episode, severe ([place]) 08/13/2018  . Cholelithiasis 07/30/2018  . GERD (gastroesophageal reflux disease) 01/06/2018  . Hypertensive cardiomyopathy ([place]) 05/21/2017  . Stress and adjustment reaction 11/04/2016  . Nonspecific abnormal electrocardiogram (ECG) (EKG) 08/30/2016  . Hyperlipidemia LDL goal <100 08/08/2015  . Sleep disorder 11/05/2014  . Obesity (BMI 30.0-34.9) 06/21/2014  . CKD (chronic kidney disease) stage 4, GFR 15-29 ml/min (HCC) 02/23/2014  . Bilateral renal cysts 02/23/2014  . Fatigue due to sleep pattern disturbance 10/20/2008  . Malignant hypertension 10/09/2007  . Migraines 10/09/2007    Past Surgical History:  Procedure Laterality Date  . 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   Procedure: 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.;  Surgeon: Jolyn Nap, MD;  Location: [facility];  Service: Orthopedics;  Laterality: Left;  . CHOLECYSTECTOMY N/A 10/03/2018   Procedure: LAPAROSCOPIC CHOLECYSTECTOMY;  Surgeon: Aviva Signs, MD;  Location: [facility];  Service: General;  Laterality: N/A;  . FINGER DEBRIDEMENT Left 09/25/2013   Thumb  . FOOT FRACTURE SURGERY Right        Family History  Problem Relation Age of Onset  . Diabetes Mother   . Hypertension Mother   . Hypertension Father   . Arthritis Father   . Diabetes Father     Social History   Tobacco Use  . Smoking status: Former Smoker    Packs/day: 0.10    Years: 15.00    Pack years:  1.50    Types: Cigarettes    Quit date: 09/15/2012    Years since quitting: 7.0  . Smokeless tobacco: Never Used  Substance Use Topics  . Alcohol use: No  . Drug use: No    Home Medications Prior to Admission medications   Medication Sig Start Date End Date Taking? Authorizing Provider  amLODipine (NORVASC) 10 MG tablet Take 1 tablet (10 mg total) by mouth daily.  09/16/19   Fayrene Helper, MD  cloNIDine (CATAPRES - DOSED IN MG/24 HR) 0.3 mg/24hr patch Place 1 patch (0.3 mg total) onto the skin once a week. 09/16/19   Fayrene Helper, MD  esomeprazole (NEXIUM) 20 MG capsule Take 20 mg by mouth daily at 12 noon.    [provider]  hydrOXYzine (VISTARIL) 50 MG capsule Take 50 mg by mouth 2 (two) times daily as needed for anxiety or itching.  12/03/18   [provider]  metoprolol tartrate (LOPRESSOR) 25 MG tablet Take 1 tablet (25 mg total) by mouth 2 (two) times daily. 06/29/19   Shelly Coss, MD  ondansetron (ZOFRAN) 8 MG tablet Take 1 tablet (8 mg total) by mouth every 4 (four) hours as needed. Patient taking differently: Take 8 mg by mouth every 4 (four) hours as needed for nausea or vomiting.  01/19/19   Nat Christen, MD  spironolactone (ALDACTONE) 25 MG tablet Take 25 mg by mouth daily. 07/24/19   [provider]    Allergies    Bee venom, Prochlorperazine edisylate, Cyclobenzaprine hcl, Aleve [naproxen sodium], Carbamazepine, Geodon [ziprasidone hcl], Ibuprofen, Maxzide [hydrochlorothiazide w-triamterene], Nsaids, Tylenol [acetaminophen], and Other  Review of Systems   Review of Systems  Constitutional: Positive for chills. Negative for appetite change, diaphoresis, fatigue, fever and unexpected weight change.  HENT: Negative for mouth sores.   Eyes: Negative for visual disturbance.  Respiratory: Negative for cough, chest tightness, shortness of breath and wheezing.   Cardiovascular: Negative for chest pain.  Gastrointestinal: Positive for nausea and vomiting. Negative for abdominal pain, constipation and diarrhea.  Endocrine: Negative for polydipsia, polyphagia and polyuria.  Genitourinary: Negative for dysuria, frequency, hematuria and urgency.  Musculoskeletal: Negative for back pain and neck stiffness.  Skin: Negative for rash.  Allergic/Immunologic: Negative for immunocompromised state.  Neurological:  Positive for headaches. Negative for syncope and light-headedness.  Hematological: Does not bruise/bleed easily.  Psychiatric/Behavioral: Negative for sleep disturbance. The patient is not nervous/anxious.     Physical Exam Updated Vital Signs BP (!) 187/117 (BP Location: Left Arm)   Pulse 91   Temp 98.7 F (37.1 C) (Oral)   Resp (!) 27   Ht 5\' 9"  (1.753 m)   Wt 90.3 kg   SpO2 100%   BMI 29.39 kg/m   Physical Exam Vitals and nursing note reviewed.  Constitutional:      General: He is not in acute distress.    Appearance: He is well-developed. He is not diaphoretic.  HENT:     Head: Normocephalic and atraumatic.  Eyes:     General: No scleral icterus.    Conjunctiva/sclera: Conjunctivae normal.     Pupils: Pupils are equal, round, and reactive to light.     Comments: No horizontal, vertical or rotational nystagmus  Neck:     Comments: Full active and passive ROM without pain No midline or paraspinal tenderness No nuchal rigidity or meningeal signs Cardiovascular:     Rate and Rhythm: Normal rate and regular rhythm.  Pulmonary:     Effort: Pulmonary effort is  normal. No tachypnea, accessory muscle usage or respiratory distress.  Abdominal:     Palpations: Abdomen is soft.     Tenderness: There is no abdominal tenderness. There is no guarding or rebound.  Musculoskeletal:        General: Normal range of motion.     Cervical back: Normal range of motion and neck supple.  Lymphadenopathy:     Cervical: No cervical adenopathy.  Skin:    General: Skin is warm and dry.     Findings: No rash.  Neurological:     Mental Status: He is alert and oriented to person, place, and time.     Cranial Nerves: No cranial nerve deficit.     Motor: No abnormal muscle tone.     Coordination: Coordination normal.     Comments: Mental Status:  Alert, oriented, thought content appropriate. Speech fluent without evidence of aphasia. Able to follow 2 step commands without difficulty.    Cranial Nerves:  II:  Peripheral visual fields grossly normal, pupils equal, round, reactive to light III,IV, VI: ptosis not present, extra-ocular motions intact bilaterally  V,VII: smile symmetric, facial light touch sensation equal VIII: hearing grossly normal bilaterally  IX,X: midline uvula rise  XI: bilateral shoulder shrug equal and strong XII: midline tongue extension  Motor:  5/5 in upper and lower extremities bilaterally including strong and equal grip strength and dorsiflexion/plantar flexion Sensory: Light touch normal in all extremities.  Cerebellar: normal finger-to-nose with bilateral upper extremities Gait: normal gait and balance CV: distal pulses palpable throughout   Psychiatric:        Behavior: Behavior normal.        Thought Content: Thought content normal.        Judgment: Judgment normal.     ED Results / Procedures / Treatments   Labs (all labs ordered are listed, but only abnormal results are displayed) Labs Reviewed  CBC WITH DIFFERENTIAL/PLATELET - Abnormal; Notable for the following components:      Result Value   RBC 3.87 (*)    Hemoglobin 12.8 (*)    HCT 36.7 (*)    RDW 10.8 (*)    All other components within normal limits  COMPREHENSIVE METABOLIC PANEL - Abnormal; Notable for the following components:   Glucose, Bld 128 (*)    BUN 35 (*)    Creatinine, Ser 2.92 (*)    GFR calc non Af Amer 24 (*)    GFR calc Af Amer 28 (*)    All other components within normal limits  LIPASE, BLOOD - Abnormal; Notable for the following components:   Lipase 139 (*)    All other components within normal limits  TROPONIN I (HIGH SENSITIVITY) - Abnormal; Notable for the following components:   Troponin I (High Sensitivity) 23 (*)    All other components within normal limits  SARS CORONAVIRUS 2 (TAT 6-24 HRS)  CBC  POC SARS CORONAVIRUS 2 AG -  ED  TROPONIN I (HIGH SENSITIVITY)    EKG EKG Interpretation  Date/Time:  Tuesday October 08 2019 11:22:42  EST Ventricular Rate:  80 PR Interval:    QRS Duration: 99 QT Interval:  390 QTC Calculation: 450 R Axis:   -73 Text Interpretation: Sinus rhythm Consider left atrial enlargement Left anterior fascicular block Baseline wander in lead(s) II III aVF tachycardia improved compared to Oct 2020 Confirmed by Sherwood Gambler [phone]) on 10/08/2019 11:27:53 AM   Radiology CT Head Wo Contrast  Result Date: 10/08/2019 CLINICAL DATA:  Headaches EXAM: CT  HEAD WITHOUT CONTRAST TECHNIQUE: Contiguous axial images were obtained from the base of the skull through the vertex without intravenous contrast. COMPARISON:  06/24/2019 FINDINGS: Brain: No evidence of acute infarction, hemorrhage, hydrocephalus, extra-axial collection or mass lesion/mass effect. Vascular: No hyperdense vessel or unexpected calcification. Skull: Normal. Negative for fracture or focal lesion. Sinuses/Orbits: No acute finding. Other: None. IMPRESSION: Normal head CT for age Electronically Signed   By: Inez Catalina M.D.   On: 10/08/2019 13:07    Procedures .Critical Care Performed by: Abigail Butts, PA-C Authorized by: Abigail Butts, PA-C   Critical care provider statement:    Critical care time (minutes):  45   Critical care time was exclusive of:  Separately billable procedures and treating other patients and teaching time   Critical care was necessary to treat or prevent imminent or life-threatening deterioration of the following conditions:  Circulatory failure   Critical care was time spent personally by me on the following activities:  Discussions with consultants, evaluation of patient's response to treatment, examination of patient, ordering and performing treatments and interventions, ordering and review of laboratory studies, ordering and review of radiographic studies, pulse oximetry, re-evaluation of patient's condition, obtaining history from patient or surrogate and review of old charts   I assumed direction of  critical care for this patient from another provider in my specialty: no     (including critical care time)  Medications Ordered in ED Medications  sodium chloride 0.9 % bolus 500 mL (0 mLs Intravenous Stopped 10/08/19 1322)  metoCLOPramide (REGLAN) injection 10 mg (10 mg Intravenous Given 10/08/19 1212)  diphenhydrAMINE (BENADRYL) injection 25 mg (25 mg Intravenous Given 10/08/19 1212)  promethazine (PHENERGAN) injection 25 mg (25 mg Intravenous Given 10/08/19 1320)  labetalol (NORMODYNE) injection 10 mg (10 mg Intravenous Given 10/08/19 1348)    ED Course  I have reviewed the triage vital signs and the nursing notes.  Pertinent labs & imaging results that were available during my care of the patient were reviewed by me and considered in my medical decision making (see chart for details).  Clinical Course as of Oct 07 1416  Tue Oct 08, 2019  1144 Hypertensive on arrival  BP(!): 187/117 [HM]  1144 EKG reviewed.  Improved from October 2020 during his hypertensive crisis.   EKG 12-Lead [HM]  1305 Blood pressure starting to improve.  Patient has had several additional episodes of vomiting.  Phenergan ordered.  BP(!): 157/94 [HM]  1305 Slightly worsening serum creatinine  Creatinine(!): 2.92 [HM]  1305 Elevated from prior  Lipase(!): 139 [HM]  1306 Anemia improved from baseline  Hemoglobin(!): 12.8 [HM]  1306 Minimally elevated.  Patient without chest pain or shortness of breath.  EKG without acute ischemia.  Troponin I (High Sensitivity)(!): 23 [HM]  1340 Blood pressure had decreased however has begun to rise again.  He continues to complain of headache and is continuing to vomit.  Will give labetalol as he has no bradycardia.  BP(!): 193/131 [HM]    Clinical Course User Index [HM] Kaizen Ibsen, Gwenlyn Perking   MDM Rules/Calculators/A&P                       Patient presents with headache and vomiting.  No altered mental status.  Normal neurologic exam.  He does have a history of  migraine headaches which she reports are similar to today's episode including photophobia which she has today.  Records reviewed, patient has a history of hypertensive urgency presenting similarly however at that  time he had altered mental status.  MRI at that time was without evidence of CVA.  Lab work today shows a mild increase in serum creatinine and a slight elevation in troponin.  He has no chest pain or shortness of breath.  EKG is nonischemic and actually appears improved from last hypertensive urgency.  CT scan without evidence of intracranial hemorrhage today.  Despite treatment for migraine headache patient has blood pressure which continues to ride and persistent vomiting.  Multiple rechecks on patient without significant improvement in symptoms or blood pressure.  I do have some concern that today's symptoms are more hypertensive urgency and less migraine headache.  Patient given labetalol IV.  Improvement in blood pressure.  Patient will be admitted for further blood pressure monitoring and treatment.  The patient was discussed with and seen by Dr. Regenia Skeeter who agrees with the treatment plan.  Final Clinical Impression(s) / ED Diagnoses Final diagnoses:  Hypertensive urgency  Bad headache  Intractable vomiting with nausea, unspecified vomiting type    Rx / DC Orders ED Discharge Orders    None       Donica Derouin, Gwenlyn Perking 10/08/19 [place], MD 10/10/19 [phone]

## 2019-10-08 NOTE — ED Notes (Signed)
Pt vomited in CT. Has taken off all EKG leads

## 2019-10-08 NOTE — H&P (Addendum)
History and Physical    [name] [id number] DOB: 10/21/1970 DOA: 10/08/2019  PCP: Fayrene Helper, MD   Patient coming from: Home  Chief Complaint: Sudden onset headache with nausea and vomiting  HPI: [name] is a 49 y.o. male with medical history significant for hypertension, CKD stage IV, GERD, and migraine headaches who presented to the ED with progressively worsening nausea and vomiting that began shortly after waking up around 9:30 AM this morning.  This occurred shortly after developing a sudden onset headache and he was also noted to have some mild photophobia, but no other visual changes were identified.  He states that this was not the worst headache of his life and he denies any fevers or chills or fatigue.  He regularly takes his blood pressure medications, but has not been able to take them this morning on account of his nausea and vomiting.  He denies any known Covid exposure.  He was previously admitted for malignant hypertension back in 06/2019 and was noted to have altered mentation at that time.  He does take hydrocodone for migraines, but was not able to take this in the morning due to his nausea and vomiting.   ED Course: Vital signs show improvement in blood pressure readings after administration of IV labetalol, but they are still mildly elevated.  He continues to have persistent frontal headache.  Laboratory data is unremarkable with creatinine consistent with his CKD stage IV.  He has been given some Phenergan and Reglan with mild improvement in his nausea.  His blood pressures are responded to labetalol.  He has also been given some Benadryl and a 500 mL fluid bolus.  CT of the head with no acute findings noted.  EKG with no acute findings noted.  Covid antigen testing is negative.  Review of Systems: All others reviewed as noted above and otherwise negative.  Past Medical History:  Diagnosis Date  . Chronic back pain   . Duodenitis with bleeding  08/2008   Ulcer - admitted to [facility]   . Essential hypertension, benign   . GERD (gastroesophageal reflux disease)   . Headache(784.0)   . Hyperlipidemia   . Migraines   . Obesity, unspecified   . Sleep apnea     Past Surgical History:  Procedure Laterality Date  . 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 CHOLECYSTECTOMY N/A 10/03/2018   Procedure: LAPAROSCOPIC CHOLECYSTECTOMY;  Surgeon: Aviva Signs, MD;  Location: [facility];  Service: General;  Laterality: N/A;  . FINGER DEBRIDEMENT Left 09/25/2013   Thumb  . FOOT FRACTURE SURGERY Right      reports that he quit smoking about 7 years ago. His smoking use included cigarettes. He has a 1.50 pack-year smoking history. He has never used smokeless tobacco. He reports that he does not drink alcohol or use drugs.  Allergies  Allergen Reactions  . Bee Venom Anaphylaxis  . Prochlorperazine Edisylate Other (See Comments)    Reaction: Jittery,uncomfortable feeling  . Cyclobenzaprine Hcl Other (See Comments)    REACTION: feel anxious, nausea  . Aleve [Naproxen Sodium] Nausea And Vomiting    States that this medication also causes abdominal pain  . Carbamazepine     REACTION: unknown reaction  . Geodon [Ziprasidone Hcl] Nausea And Vomiting  . Ibuprofen Nausea And Vomiting    States  that this medication also causes abdominal pain  . Maxzide [Hydrochlorothiazide W-Triamterene]     headache  . Nsaids Other (See Comments)    H/o severe anemia from UGIB from ulcer  . Tylenol [Acetaminophen] Nausea And Vomiting    Patient states that this medication also causes abdominal pain  . Other Nausea And Vomiting, Rash and Other (See Comments)    ALL MUSCLE RELAXANTS: states that they affect sciatic nerve, may cause nausea and vomiting, rash    Family History  Problem  Relation Age of Onset  . Diabetes Mother   . Hypertension Mother   . Hypertension Father   . Arthritis Father   . Diabetes Father     Prior to Admission medications   Medication Sig Start Date End Date Taking? Authorizing Provider  amLODipine (NORVASC) 10 MG tablet Take 1 tablet (10 mg total) by mouth daily. 09/16/19   Fayrene Helper, MD  cloNIDine (CATAPRES - DOSED IN MG/24 HR) 0.3 mg/24hr patch Place 1 patch (0.3 mg total) onto the skin once a week. 09/16/19   Fayrene Helper, MD  esomeprazole (NEXIUM) 20 MG capsule Take 20 mg by mouth daily at 12 noon.    [provider]  hydrOXYzine (VISTARIL) 50 MG capsule Take 50 mg by mouth 2 (two) times daily as needed for anxiety or itching.  12/03/18   [provider]  metoprolol tartrate (LOPRESSOR) 25 MG tablet Take 1 tablet (25 mg total) by mouth 2 (two) times daily. 06/29/19   Shelly Coss, MD  ondansetron (ZOFRAN) 8 MG tablet Take 1 tablet (8 mg total) by mouth every 4 (four) hours as needed. Patient taking differently: Take 8 mg by mouth every 4 (four) hours as needed for nausea or vomiting.  01/19/19   Nat Christen, MD  spironolactone (ALDACTONE) 25 MG tablet Take 25 mg by mouth daily. 07/24/19   [provider]    Physical Exam: Vitals:   10/08/19 1352 10/08/19 1400 10/08/19 1415 10/08/19 1430  BP: (!) 157/94 (!) 158/92 (!) 158/94 (!) 147/100  Pulse: 72 68 68 80  Resp: 12 12 10 12   Temp:      TempSrc:      SpO2: 97% 95% 97% 95%  Weight:      Height:        Constitutional: NAD, calm, comfortable Vitals:   10/08/19 1352 10/08/19 1400 10/08/19 1415 10/08/19 1430  BP: (!) 157/94 (!) 158/92 (!) 158/94 (!) 147/100  Pulse: 72 68 68 80  Resp: 12 12 10 12   Temp:      TempSrc:      SpO2: 97% 95% 97% 95%  Weight:      Height:       Eyes: lids and conjunctivae normal ENMT: Mucous membranes are moist.  Neck: normal, supple Respiratory: clear to auscultation bilaterally. Normal respiratory effort.  No accessory muscle use.  Cardiovascular: Regular rate and rhythm, no murmurs. No extremity edema. Abdomen: no tenderness, no distention. Bowel sounds positive.  Musculoskeletal:  No joint deformity upper and lower extremities.   Skin: no rashes, lesions, ulcers.  Psychiatric: Normal judgment and insight. Alert and oriented x 3. Normal mood.   Labs on Admission: I have personally reviewed following labs and imaging studies  CBC: Recent Labs  Lab 10/08/19 1156  WBC 8.0  NEUTROABS 7.0  HGB 12.8*  HCT 36.7*  MCV 94.8  PLT [id number]   Basic Metabolic Panel: Recent Labs  Lab 10/08/19 1156  NA 140  K 4.0  CL  108  CO2 22  GLUCOSE 128*  BUN 35*  CREATININE 2.92*  CALCIUM 9.1   GFR: Estimated Creatinine Clearance: 33.6 mL/min (A) (by C-G formula based on SCr of 2.92 mg/dL (H)). Liver Function Tests: Recent Labs  Lab 10/08/19 1156  AST 19  ALT 13  ALKPHOS 73  BILITOT 0.8  PROT 7.8  ALBUMIN 4.7   Recent Labs  Lab 10/08/19 1156  LIPASE 139*   No results for input(s): AMMONIA in the last 168 hours. Coagulation Profile: No results for input(s): INR, PROTIME in the last 168 hours. Cardiac Enzymes: No results for input(s): CKTOTAL, CKMB, CKMBINDEX, TROPONINI in the last 168 hours. BNP (last 3 results) No results for input(s): PROBNP in the last 8760 hours. HbA1C: No results for input(s): HGBA1C in the last 72 hours. CBG: No results for input(s): GLUCAP in the last 168 hours. Lipid Profile: No results for input(s): CHOL, HDL, LDLCALC, TRIG, CHOLHDL, LDLDIRECT in the last 72 hours. Thyroid Function Tests: No results for input(s): TSH, T4TOTAL, FREET4, T3FREE, THYROIDAB in the last 72 hours. Anemia Panel: No results for input(s): VITAMINB12, FOLATE, FERRITIN, TIBC, IRON, RETICCTPCT in the last 72 hours. Urine analysis:    Component Value Date/Time   COLORURINE STRAW (A) 06/25/2019 1016   APPEARANCEUR CLEAR 06/25/2019 1016   LABSPEC 1.008 06/25/2019 1016   PHURINE 6.0  06/25/2019 1016   GLUCOSEU NEGATIVE 06/25/2019 1016   HGBUR SMALL (A) 06/25/2019 1016   BILIRUBINUR NEGATIVE 06/25/2019 1016   BILIRUBINUR small 04/07/2011 1343   KETONESUR NEGATIVE 06/25/2019 1016   PROTEINUR 100 (A) 06/25/2019 1016   UROBILINOGEN 0.2 e.u/dL 04/07/2011 1343   NITRITE NEGATIVE 06/25/2019 1016   LEUKOCYTESUR NEGATIVE 06/25/2019 1016    Radiological Exams on Admission: CT Head Wo Contrast  Result Date: 10/08/2019 CLINICAL DATA:  Headaches EXAM: CT HEAD WITHOUT CONTRAST TECHNIQUE: Contiguous axial images were obtained from the base of the skull through the vertex without intravenous contrast. COMPARISON:  06/24/2019 FINDINGS: Brain: No evidence of acute infarction, hemorrhage, hydrocephalus, extra-axial collection or mass lesion/mass effect. Vascular: No hyperdense vessel or unexpected calcification. Skull: Normal. Negative for fracture or focal lesion. Sinuses/Orbits: No acute finding. Other: None. IMPRESSION: Normal head CT for age Electronically Signed   By: Inez Catalina M.D.   On: 10/08/2019 13:07    EKG: Independently reviewed.  80 bpm sinus rhythm with LAFB.  Assessment/Plan Active Problems:   Hypertensive urgency    Intractable nausea and vomiting likely related to migraine headache -Maintain on Zofran and Phenergan as needed for nausea and vomiting -Does not appear to have any acute abdominal issues and has had normal bowel movement just yesterday/denies abdominal pain -Clear liquid diet and advance as tolerated -IV pain medications for migraine headache as needed along with oral agents ordered once tolerated -Gentle, time-limited IV fluid  Hypertensive crisis likely secondary to above -Continue clonidine patch and continue other home medications to include amlodipine and metoprolol -Hold Aldactone for now until able to tolerate diet  CKD stage IV -Appears to be at baseline -Continue monitoring  GERD -PPI IV daily on account of current  symptoms  History of migraine headaches -Does not appear to be on any home prophylactic or abortive medications -Would suggest follow-up with neurology outpatient   DVT prophylaxis: Heparin Code Status: Full code Family Communication: None at bedside Disposition Plan: Admit for treatment of hypertensive urgency along with intractable nausea and vomiting Consults called: None Admission status: Observation, telemetry   Carolle Ishii D Manuella Ghazi DO [facility] Pager [phone]  If 7PM-7AM, please contact night-coverage [email or web address] Password [email or web address]  10/08/2019, 2:42 PM

## 2019-10-08 NOTE — ED Notes (Signed)
[name] [phone]

## 2019-10-08 NOTE — ED Notes (Signed)
Have notified PA about patient's increasing BP.

## 2019-10-08 NOTE — ED Triage Notes (Signed)
Pt presents to ED with complaints of hypertension and vomiting since this am. Pt with hx hypertensive emergency Oct 2020.

## 2019-10-09 DIAGNOSIS — G43809 Other migraine, not intractable, without status migrainosus: Secondary | ICD-10-CM

## 2019-10-09 DIAGNOSIS — R1110 Vomiting, unspecified: Secondary | ICD-10-CM

## 2019-10-09 DIAGNOSIS — N184 Chronic kidney disease, stage 4 (severe): Secondary | ICD-10-CM | POA: Diagnosis not present

## 2019-10-09 DIAGNOSIS — I43 Cardiomyopathy in diseases classified elsewhere: Secondary | ICD-10-CM

## 2019-10-09 DIAGNOSIS — G479 Sleep disorder, unspecified: Secondary | ICD-10-CM

## 2019-10-09 DIAGNOSIS — K219 Gastro-esophageal reflux disease without esophagitis: Secondary | ICD-10-CM | POA: Diagnosis not present

## 2019-10-09 DIAGNOSIS — I119 Hypertensive heart disease without heart failure: Secondary | ICD-10-CM

## 2019-10-09 DIAGNOSIS — R5383 Other fatigue: Secondary | ICD-10-CM | POA: Diagnosis not present

## 2019-10-09 DIAGNOSIS — I160 Hypertensive urgency: Secondary | ICD-10-CM | POA: Diagnosis not present

## 2019-10-09 DIAGNOSIS — I10 Essential (primary) hypertension: Secondary | ICD-10-CM

## 2019-10-09 LAB — CBC
HCT: 32 % — ABNORMAL LOW (ref 39.0–52.0)
Hemoglobin: 10.8 g/dL — ABNORMAL LOW (ref 13.0–17.0)
MCH: 32.6 pg (ref 26.0–34.0)
MCHC: 33.8 g/dL (ref 30.0–36.0)
MCV: 96.7 fL (ref 80.0–100.0)
Platelets: 254 10*3/uL (ref 150–400)
RBC: 3.31 MIL/uL — ABNORMAL LOW (ref 4.22–5.81)
RDW: 11.1 % — ABNORMAL LOW (ref 11.5–15.5)
WBC: 7.2 10*3/uL (ref 4.0–10.5)
nRBC: 0 % (ref 0.0–0.2)

## 2019-10-09 LAB — COMPREHENSIVE METABOLIC PANEL
ALT: 9 U/L (ref 0–44)
AST: 15 U/L (ref 15–41)
Albumin: 3.9 g/dL (ref 3.5–5.0)
Alkaline Phosphatase: 58 U/L (ref 38–126)
Anion gap: 11 (ref 5–15)
BUN: 36 mg/dL — ABNORMAL HIGH (ref 6–20)
CO2: 25 mmol/L (ref 22–32)
Calcium: 9.2 mg/dL (ref 8.9–10.3)
Chloride: 106 mmol/L (ref 98–111)
Creatinine, Ser: 3.01 mg/dL — ABNORMAL HIGH (ref 0.61–1.24)
GFR calc Af Amer: 27 mL/min — ABNORMAL LOW (ref >60)
GFR calc non Af Amer: 23 mL/min — ABNORMAL LOW (ref >60)
Glucose, Bld: 104 mg/dL — ABNORMAL HIGH (ref 70–99)
Potassium: 3.8 mmol/L (ref 3.5–5.1)
Sodium: 142 mmol/L (ref 135–145)
Total Bilirubin: 1.3 mg/dL — ABNORMAL HIGH (ref 0.3–1.2)
Total Protein: 6.7 g/dL (ref 6.5–8.1)

## 2019-10-09 LAB — MAGNESIUM: Magnesium: 2 mg/dL (ref 1.7–2.4)

## 2019-10-09 MED ORDER — METOPROLOL TARTRATE 25 MG PO TABS: 25.0000 mg | ORAL_TABLET | Freq: Two times a day (BID) | ORAL | 1 refills | Status: DC

## 2019-10-09 NOTE — Discharge Summary (Signed)
Physician Discharge Summary  [name] [id number] DOB: 08-Oct-1970 DOA: 10/08/2019  PCP: Fayrene Helper, MD  Admit date: 10/08/2019 Discharge date: 10/09/2019  Admitted From:  Home  Disposition:  Home   Recommendations for Outpatient Follow-up:  1. Follow up with PCP in 1 weeks 2. Follow up with nephrologist in 2 weeks 3. Follow up with neurologist in 2 weeks  Discharge Condition: STABLE   CODE STATUS: FULL    Brief Hospitalization Summary: Please see all hospital notes, images, labs for full details of the hospitalization. ADMISSION HPI: [name] is a 49 y.o. male with medical history significant for hypertension, CKD stage IV, GERD, and migraine headaches who presented to the ED with progressively worsening nausea and vomiting that began shortly after waking up around 9:30 AM this morning.  This occurred shortly after developing a sudden onset headache and he was also noted to have some mild photophobia, but no other visual changes were identified.  He states that this was not the worst headache of his life and he denies any fevers or chills or fatigue.  He regularly takes his blood pressure medications, but has not been able to take them this morning on account of his nausea and vomiting.  He denies any known Covid exposure.  He was previously admitted for malignant hypertension back in 06/2019 and was noted to have altered mentation at that time.  He does take hydrocodone for migraines, but was not able to take this in the morning due to his nausea and vomiting.   ED Course: Vital signs show improvement in blood pressure readings after administration of IV labetalol, but they are still mildly elevated.  He continues to have persistent frontal headache.  Laboratory data is unremarkable with creatinine consistent with his CKD stage IV.  He has been given some Phenergan and Reglan with mild improvement in his nausea.  His blood pressures are responded to labetalol.  He has  also been given some Benadryl and a 500 mL fluid bolus.  CT of the head with no acute findings noted.  EKG with no acute findings noted.  Covid antigen testing is negative.  The patient was started on supportive therapies and his hypertension was treated.  He was restarted on metoprolol 25 mg twice daily.  He was given a new clonidine patch.  He reports that he is feeling better.  His nausea and vomiting has resolved and he has been able to eat and drink.  He still is having headaches but they are improving.  His blood pressure is improving.  He was strongly advised to have close outpatient follow-up with his PCP.  Establish care with a neurologist Dr. Merlene Laughter.  Follow-up with his cardiologist and his nephrologist.  The patient verbalized understanding.  He reported that he has his medications at home.  I encouraged him to take his medications as prescribed.  He is at high risk for eventually requiring hemodialysis due to worsening chronic kidney disease.  Discharge Diagnoses:  Active Problems:   Malignant hypertension   Fatigue due to sleep pattern disturbance   Migraines   CKD (chronic kidney disease) stage 4, GFR 15-29 ml/min (HCC)   Sleep disorder   Hypertensive cardiomyopathy (HCC)   GERD (gastroesophageal reflux disease)   Hypertension   Hypertensive urgency   Intractable vomiting   Discharge Instructions:  Allergies as of 10/09/2019      Reactions   Bee Venom Anaphylaxis   Prochlorperazine Edisylate Other (See Comments)   Reaction: Jittery,uncomfortable feeling  Cyclobenzaprine Hcl Other (See Comments)   REACTION: feel anxious, nausea   Aleve [naproxen Sodium] Nausea And Vomiting   States that this medication also causes abdominal pain   Carbamazepine    REACTION: unknown reaction   Geodon [ziprasidone Hcl] Nausea And Vomiting   Ibuprofen Nausea And Vomiting   States that this medication also causes abdominal pain   Maxzide [hydrochlorothiazide W-triamterene]    headache    Nsaids Other (See Comments)   H/o severe anemia from UGIB from ulcer   Tylenol [acetaminophen] Nausea And Vomiting   Patient states that this medication also causes abdominal pain   Other Nausea And Vomiting, Rash, Other (See Comments)   ALL MUSCLE RELAXANTS: states that they affect sciatic nerve, may cause nausea and vomiting, rash      Medication List    TAKE these medications   amLODipine 10 MG tablet Commonly known as: NORVASC Take 1 tablet (10 mg total) by mouth daily.   cloNIDine 0.3 mg/24hr patch Commonly known as: CATAPRES - Dosed in mg/24 hr Place 1 patch (0.3 mg total) onto the skin once a week.   esomeprazole 20 MG capsule Commonly known as: NEXIUM Take 20 mg by mouth daily as needed (for acid reflux).   hydrOXYzine 50 MG capsule Commonly known as: VISTARIL Take 50 mg by mouth 2 (two) times daily as needed for anxiety or itching.   metoprolol tartrate 25 MG tablet Commonly known as: LOPRESSOR Take 1 tablet (25 mg total) by mouth 2 (two) times daily.   ondansetron 8 MG tablet Commonly known as: Zofran Take 1 tablet (8 mg total) by mouth every 4 (four) hours as needed. What changed: reasons to take this   spironolactone 25 MG tablet Commonly known as: ALDACTONE Take 25 mg by mouth 2 (two) times daily.       Allergies  Allergen Reactions  . Bee Venom Anaphylaxis  . Prochlorperazine Edisylate Other (See Comments)    Reaction: Jittery,uncomfortable feeling  . Cyclobenzaprine Hcl Other (See Comments)    REACTION: feel anxious, nausea  . Aleve [Naproxen Sodium] Nausea And Vomiting    States that this medication also causes abdominal pain  . Carbamazepine     REACTION: unknown reaction  . Geodon [Ziprasidone Hcl] Nausea And Vomiting  . Ibuprofen Nausea And Vomiting    States that this medication also causes abdominal pain  . Maxzide [Hydrochlorothiazide W-Triamterene]     headache  . Nsaids Other (See Comments)    H/o severe anemia from UGIB from  ulcer  . Tylenol [Acetaminophen] Nausea And Vomiting    Patient states that this medication also causes abdominal pain  . Other Nausea And Vomiting, Rash and Other (See Comments)    ALL MUSCLE RELAXANTS: states that they affect sciatic nerve, may cause nausea and vomiting, rash   Allergies as of 10/09/2019      Reactions   Bee Venom Anaphylaxis   Prochlorperazine Edisylate Other (See Comments)   Reaction: Jittery,uncomfortable feeling   Cyclobenzaprine Hcl Other (See Comments)   REACTION: feel anxious, nausea   Aleve [naproxen Sodium] Nausea And Vomiting   States that this medication also causes abdominal pain   Carbamazepine    REACTION: unknown reaction   Geodon [ziprasidone Hcl] Nausea And Vomiting   Ibuprofen Nausea And Vomiting   States that this medication also causes abdominal pain   Maxzide [hydrochlorothiazide W-triamterene]    headache   Nsaids Other (See Comments)   H/o severe anemia from UGIB from ulcer   Tylenol [  acetaminophen] Nausea And Vomiting   Patient states that this medication also causes abdominal pain   Other Nausea And Vomiting, Rash, Other (See Comments)   ALL MUSCLE RELAXANTS: states that they affect sciatic nerve, may cause nausea and vomiting, rash      Medication List    TAKE these medications   amLODipine 10 MG tablet Commonly known as: NORVASC Take 1 tablet (10 mg total) by mouth daily.   cloNIDine 0.3 mg/24hr patch Commonly known as: CATAPRES - Dosed in mg/24 hr Place 1 patch (0.3 mg total) onto the skin once a week.   esomeprazole 20 MG capsule Commonly known as: NEXIUM Take 20 mg by mouth daily as needed (for acid reflux).   hydrOXYzine 50 MG capsule Commonly known as: VISTARIL Take 50 mg by mouth 2 (two) times daily as needed for anxiety or itching.   metoprolol tartrate 25 MG tablet Commonly known as: LOPRESSOR Take 1 tablet (25 mg total) by mouth 2 (two) times daily.   ondansetron 8 MG tablet Commonly known as: Zofran Take 1  tablet (8 mg total) by mouth every 4 (four) hours as needed. What changed: reasons to take this   spironolactone 25 MG tablet Commonly known as: ALDACTONE Take 25 mg by mouth 2 (two) times daily.       Procedures/Studies: CT Head Wo Contrast  Result Date: 10/08/2019 CLINICAL DATA:  Headaches EXAM: CT HEAD WITHOUT CONTRAST TECHNIQUE: Contiguous axial images were obtained from the base of the skull through the vertex without intravenous contrast. COMPARISON:  06/24/2019 FINDINGS: Brain: No evidence of acute infarction, hemorrhage, hydrocephalus, extra-axial collection or mass lesion/mass effect. Vascular: No hyperdense vessel or unexpected calcification. Skull: Normal. Negative for fracture or focal lesion. Sinuses/Orbits: No acute finding. Other: None. IMPRESSION: Normal head CT for age Electronically Signed   By: Inez Catalina M.D.   On: 10/08/2019 13:07      Subjective: Patient says he is feeling better.  His headaches have not completely resolved but they have improved significantly.  He is eating and drinking.  Discharge Exam: Vitals:   10/09/19 0500 10/09/19 0911  BP: (!) 146/100 (!) 143/95  Pulse: 66   Resp: 16   Temp: 99.9 F (37.7 C)   SpO2: 100%    Vitals:   10/08/19 2005 10/09/19 0500 10/09/19 0600 10/09/19 0911  BP: (!) 163/106 (!) 146/100  (!) 143/95  Pulse: 68 66    Resp: 14 16    Temp: (!) 100.7 F (38.2 C) 99.9 F (37.7 C)    TempSrc: Oral Oral    SpO2: 100% 100%    Weight: 84.5 kg  85.7 kg   Height: 5\' 9"  (1.753 m)      General: Pt is alert, awake, not in acute distress Cardiovascular: RRR, S1/S2 +, no rubs, no gallops Respiratory: CTA bilaterally, no wheezing, no rhonchi Abdominal: Soft, NT, ND, bowel sounds + Extremities: no edema, no cyanosis Neurological exam, no focal findings.   The results of significant diagnostics from this hospitalization (including imaging, microbiology, ancillary and laboratory) are listed below for reference.      Microbiology: Recent Results (from the past 240 hour(s))  SARS CORONAVIRUS 2 (TAT 6-24 HRS) Nasopharyngeal Nasopharyngeal Swab     Status: None   Collection Time: 10/08/19  2:35 PM   Specimen: Nasopharyngeal Swab  Result Value Ref Range Status   SARS Coronavirus 2 NEGATIVE NEGATIVE Final    Comment: (NOTE) SARS-CoV-2 target nucleic acids are NOT DETECTED. The SARS-CoV-2 RNA is generally detectable  in upper and lower respiratory specimens during the acute phase of infection. Negative results do not preclude SARS-CoV-2 infection, do not rule out co-infections with other pathogens, and should not be used as the sole basis for treatment or other patient management decisions. Negative results must be combined with clinical observations, patient history, and epidemiological information. The expected result is Negative. Fact Sheet for Patients: SugarRoll.be Fact Sheet for Healthcare Providers: https://www.woods-mathews.com/ This test is not yet approved or cleared by the [place] FDA and  has been authorized for detection and/or diagnosis of SARS-CoV-2 by FDA under an Emergency Use Authorization (EUA). This EUA will remain  in effect (meaning this test can be used) for the duration of the COVID-19 declaration under Section 56 4(b)(1) of the Act, 21 U.S.C. section 360bbb-3(b)(1), unless the authorization is terminated or revoked sooner. Performed at [facility], [place] [address], [place], [place] [address]      Labs: BNP (last 3 results) No results for input(s): BNP in the last 8760 hours. Basic Metabolic Panel: Recent Labs  Lab 10/08/19 1156 10/09/19 0615  NA 140 142  K 4.0 3.8  CL 108 106  CO2 22 25  GLUCOSE 128* 104*  BUN 35* 36*  CREATININE 2.92* 3.01*  CALCIUM 9.1 9.2  MG  --  2.0   Liver Function Tests: Recent Labs  Lab 10/08/19 1156 10/09/19 0615  AST 19 15  ALT 13 9  ALKPHOS 73 58  BILITOT 0.8 1.3*   PROT 7.8 6.7  ALBUMIN 4.7 3.9   Recent Labs  Lab 10/08/19 1156  LIPASE 139*   No results for input(s): AMMONIA in the last 168 hours. CBC: Recent Labs  Lab 10/08/19 1156 10/09/19 0615  WBC 8.0 7.2  NEUTROABS 7.0  --   HGB 12.8* 10.8*  HCT 36.7* 32.0*  MCV 94.8 96.7  PLT 291 254   Cardiac Enzymes: No results for input(s): CKTOTAL, CKMB, CKMBINDEX, TROPONINI in the last 168 hours. BNP: Invalid input(s): POCBNP CBG: No results for input(s): GLUCAP in the last 168 hours. D-Dimer No results for input(s): DDIMER in the last 72 hours. Hgb A1c No results for input(s): HGBA1C in the last 72 hours. Lipid Profile No results for input(s): CHOL, HDL, LDLCALC, TRIG, CHOLHDL, LDLDIRECT in the last 72 hours. Thyroid function studies No results for input(s): TSH, T4TOTAL, T3FREE, THYROIDAB in the last 72 hours.  Invalid input(s): FREET3 Anemia work up No results for input(s): VITAMINB12, FOLATE, FERRITIN, TIBC, IRON, RETICCTPCT in the last 72 hours. Urinalysis    Component Value Date/Time   COLORURINE STRAW (A) 06/25/2019 1016   APPEARANCEUR CLEAR 06/25/2019 1016   LABSPEC 1.008 06/25/2019 1016   PHURINE 6.0 06/25/2019 1016   GLUCOSEU NEGATIVE 06/25/2019 1016   HGBUR SMALL (A) 06/25/2019 1016   BILIRUBINUR NEGATIVE 06/25/2019 1016   BILIRUBINUR small 04/07/2011 1343   KETONESUR NEGATIVE 06/25/2019 1016   PROTEINUR 100 (A) 06/25/2019 1016   UROBILINOGEN 0.2 e.u/dL 04/07/2011 1343   NITRITE NEGATIVE 06/25/2019 1016   LEUKOCYTESUR NEGATIVE 06/25/2019 1016   Sepsis Labs Invalid input(s): PROCALCITONIN,  WBC,  LACTICIDVEN Microbiology Recent Results (from the past 240 hour(s))  SARS CORONAVIRUS 2 (TAT 6-24 HRS) Nasopharyngeal Nasopharyngeal Swab     Status: None   Collection Time: 10/08/19  2:35 PM   Specimen: Nasopharyngeal Swab  Result Value Ref Range Status   SARS Coronavirus 2 NEGATIVE NEGATIVE Final    Comment: (NOTE) SARS-CoV-2 target nucleic acids are NOT  DETECTED. The SARS-CoV-2 RNA is generally  detectable in upper and lower respiratory specimens during the acute phase of infection. Negative results do not preclude SARS-CoV-2 infection, do not rule out co-infections with other pathogens, and should not be used as the sole basis for treatment or other patient management decisions. Negative results must be combined with clinical observations, patient history, and epidemiological information. The expected result is Negative. Fact Sheet for Patients: SugarRoll.be Fact Sheet for Healthcare Providers: https://www.woods-mathews.com/ This test is not yet approved or cleared by the [place] FDA and  has been authorized for detection and/or diagnosis of SARS-CoV-2 by FDA under an Emergency Use Authorization (EUA). This EUA will remain  in effect (meaning this test can be used) for the duration of the COVID-19 declaration under Section 56 4(b)(1) of the Act, 21 U.S.C. section 360bbb-3(b)(1), unless the authorization is terminated or revoked sooner. Performed at [facility], [place] [address] [place], [place] [address]    Time coordinating discharge:  32 mins   SIGNED:  Irwin Brakeman, MD  [facility] 10/09/2019, 10:18 AM How to contact the [facility] Attending or Consulting provider [place] or covering provider during after hours [place], for this patient?  1. Check the care team in [facility] and look for a) attending/consulting [facility] provider listed and b) the [facility] team listed 2. Log into [email or web address] and use [place]'s universal password to access. If you do not have the password, please contact the hospital operator. 3. Locate the [facility] provider you are looking for under [facility] and page to a number that you can be directly reached. 4. If you still have difficulty reaching the provider, please page the [facility] (Director on Call) for the Hospitalists listed on amion for assistance.

## 2019-10-09 NOTE — Discharge Instructions (Signed)
IMPORTANT INFORMATION: PAY CLOSE ATTENTION   PHYSICIAN DISCHARGE INSTRUCTIONS  Follow with Primary care provider  Simpson, Margaret E, MD  and other consultants as instructed by your Hospitalist Physician  SEEK MEDICAL CARE OR RETURN TO EMERGENCY ROOM IF SYMPTOMS COME BACK, WORSEN OR NEW PROBLEM DEVELOPS   Please note: You were cared for by a hospitalist during your hospital stay. Every effort will be made to forward records to your primary care provider.  You can request that your primary care provider send for your hospital records if they have not received them.  Once you are discharged, your primary care physician will handle any further medical issues. Please note that NO REFILLS for any discharge medications will be authorized once you are discharged, as it is imperative that you return to your primary care physician (or establish a relationship with a primary care physician if you do not have one) for your post hospital discharge needs so that they can reassess your need for medications and monitor your lab values.  Please get a complete blood count and chemistry panel checked by your Primary MD at your next visit, and again as instructed by your Primary MD.  Get Medicines reviewed and adjusted: Please take all your medications with you for your next visit with your Primary MD  Laboratory/radiological data: Please request your Primary MD to go over all hospital tests and procedure/radiological results at the follow up, please ask your primary care provider to get all Hospital records sent to his/her office.  In some cases, they will be blood work, cultures and biopsy results pending at the time of your discharge. Please request that your primary care provider follow up on these results.  If you are diabetic, please bring your blood sugar readings with you to your follow up appointment with primary care.    Please call and make your follow up appointments as soon as possible.    Also  Note the following: If you experience worsening of your admission symptoms, develop shortness of breath, life threatening emergency, suicidal or homicidal thoughts you must seek medical attention immediately by calling 911 or calling your MD immediately  if symptoms less severe.  You must read complete instructions/literature along with all the possible adverse reactions/side effects for all the Medicines you take and that have been prescribed to you. Take any new Medicines after you have completely understood and accpet all the possible adverse reactions/side effects.   Do not drive when taking Pain medications or sleeping medications (Benzodiazepines)  Do not take more than prescribed Pain, Sleep and Anxiety Medications. It is not advisable to combine anxiety,sleep and pain medications without talking with your primary care practitioner  Special Instructions: If you have smoked or chewed Tobacco  in the last 2 yrs please stop smoking, stop any regular Alcohol  and or any Recreational drug use.  Wear Seat belts while driving.  Do not drive if taking any narcotic, mind altering or controlled substances or recreational drugs or alcohol.       

## 2019-10-10 ENCOUNTER — Telehealth: Payer: Self-pay

## 2019-10-10 NOTE — Telephone Encounter (Signed)
Transition Care Management Follow-up Telephone Call   Date discharged?  10/09/19              How have you been since you were released from the hospital? weak   Do you understand why you were in the hospital? Blood pressure   Do you understand the discharge instructions? yes   Where were you discharged to? home   Items Reviewed:  Medications reviewed: yes  Allergies reviewed: yes  Dietary changes reviewed: yes  Referrals reviewed: to see neurologist   Functional Questionnaire:   Activities of Daily Living (ADLs): has help if he needs it    Any transportation issues/concerns?: no   Any patient concerns? going to see neurologist concerned about headaches   Confirmed importance and date/time of follow-up visits scheduled 10/17/19 at 2:40 with NP     Confirmed with patient if condition begins to worsen call PCP or go to the ER.  Patient was given the office number and encouraged to call back with question or concerns.  :  Yes with verbal understanding

## 2019-10-17 ENCOUNTER — Other Ambulatory Visit: Payer: Self-pay

## 2019-10-17 ENCOUNTER — Encounter: Payer: Self-pay | Admitting: Family Medicine

## 2019-10-17 ENCOUNTER — Ambulatory Visit (INDEPENDENT_AMBULATORY_CARE_PROVIDER_SITE_OTHER): Payer: BC Managed Care – PPO | Admitting: Family Medicine

## 2019-10-17 VITALS — BP 126/80 | HR 83 | Temp 98.0°F | Resp 15 | Ht 70.0 in | Wt 192.1 lb

## 2019-10-17 DIAGNOSIS — N184 Chronic kidney disease, stage 4 (severe): Secondary | ICD-10-CM | POA: Diagnosis not present

## 2019-10-17 DIAGNOSIS — G43809 Other migraine, not intractable, without status migrainosus: Secondary | ICD-10-CM

## 2019-10-17 DIAGNOSIS — Z7689 Persons encountering health services in other specified circumstances: Secondary | ICD-10-CM

## 2019-10-17 DIAGNOSIS — I150 Renovascular hypertension: Secondary | ICD-10-CM

## 2019-10-17 DIAGNOSIS — I10 Essential (primary) hypertension: Secondary | ICD-10-CM

## 2019-10-17 NOTE — Progress Notes (Signed)
Subjective:  Patient ID: [name], male    DOB: 01/31/1971  Age: 49 y.o. MRN: [id number]  CC: No chief complaint on file.     HPI  HPI  [name] a 49 y.o.malewith medical history significant forhypertension, CKD stage IV, GERD, and migraine headaches who presented to the ED on January 19 with progressively worsening nausea and vomiting that began shortly after waking up around 9:30 AM that morning.   This occurred shortly after developing a sudden onset headache and he was also noted to have some mild photophobia, but no other visual changes were identified. He states that this was not the worst headache of his life and he denies any fevers or chills or fatigue. He regularly takes his blood pressure medications, but has not been able to take them that morning on account of his nausea and vomiting. He denied any known Covid exposure. He was previously admitted for malignant hypertension back in 06/2019 and was noted to have altered mentation at that time. He does take hydrocodone for migraines, but was not able to take this in the morning due to his nausea and vomiting.  ED Course:Vital signs showed improvement in blood pressure readings after administration of IV labetalol, but they are still mildly elevated. He continues to have persistent frontal headache. Laboratory data was unremarkable with creatinine consistent with his CKD stage IV. He has been given some Phenergan and Reglan with mild improvement in his nausea. His blood pressures responded to labetalol. He was also been given some Benadryl and a 500 mL fluid bolus. CT of the head with no acute findings noted. EKG with no acute findings noted.Covid antigen testing is negative.  He was started on supportive therapies and his hypertension was treated.  He was restarted on metoprolol 25 mg twice daily.  He was given a new clonidine patch.  He reported that he was feeling better.  His nausea and  vomiting resolved and he was been able to eat and drink.  He still was having headaches but they are improving.  His blood pressure improved.  He was strongly advised to have close outpatient follow-up with his PCP.  To establish care with a neurologist Dr. Merlene Laughter.  Follow-up with his cardiologist and his nephrologist.  The patient verbalized understanding.  He reported that he has his medications at home.  He is at high risk for eventually requiring hemodialysis due to worsening chronic kidney disease.  Today he reports he does not have a headache.  Reports he gets about 5 headaches a month.  Is not currently taking anything outside of the blood pressure medications that he has been taking.  Possibly needs to be on prophylactic medication.  Reports that he has been taking his medications as directed.  Blood pressure is well controlled today he reports that he thinks that the migraine causes blood pressure to go up plus he was not able to take his medication as he normally was able to.  Today reports doing good and is going to be following up with his nephrologist tomorrow.  And has a appointment with a pain management doctor for migraine therapy on February 9.  Today patient denies signs and symptoms of COVID 19 infection including fever, chills, cough, shortness of breath, and headache. Past Medical, Surgical, Social History, Allergies, and Medications have been Reviewed.   Past Medical History:  Diagnosis Date  . Chronic back pain   . Duodenitis with bleeding 08/2008   Ulcer -  admitted to [facility]   . Essential hypertension, benign   . GERD (gastroesophageal reflux disease)   . Headache(784.0)   . Hyperlipidemia   . Migraines   . Obesity, unspecified   . Sleep apnea     Current Meds  Medication Sig  . amLODipine (NORVASC) 10 MG tablet Take 1 tablet (10 mg total) by mouth daily.  . cloNIDine (CATAPRES - DOSED IN MG/24 HR) 0.3 mg/24hr patch Place 1 patch (0.3 mg total) onto the  skin once a week.  . esomeprazole (NEXIUM) 20 MG capsule Take 20 mg by mouth daily as needed (for acid reflux).   . hydrOXYzine (VISTARIL) 50 MG capsule Take 50 mg by mouth 2 (two) times daily as needed for anxiety or itching.   . metoprolol tartrate (LOPRESSOR) 25 MG tablet Take 1 tablet (25 mg total) by mouth 2 (two) times daily.  . ondansetron (ZOFRAN) 8 MG tablet Take 1 tablet (8 mg total) by mouth every 4 (four) hours as needed. (Patient taking differently: Take 8 mg by mouth every 4 (four) hours as needed for nausea or vomiting. )  . spironolactone (ALDACTONE) 25 MG tablet Take 25 mg by mouth 2 (two) times daily.     ROS:  Review of Systems  Constitutional: Negative.   HENT: Negative.   Eyes: Negative.   Respiratory: Negative.   Cardiovascular: Negative.   Gastrointestinal: Negative.   Genitourinary: Negative.   Musculoskeletal: Negative.   Skin: Negative.   Neurological: Negative.   Endo/Heme/Allergies: Negative.   Psychiatric/Behavioral: Negative.   All other systems reviewed and are negative.    Objective:   Today's Vitals: BP 126/80   Pulse 83   Temp 98 F (36.7 C) (Temporal)   Resp 15   Ht 5\' 10"  (1.778 m)   Wt 192 lb 1.9 oz (87.1 kg)   SpO2 97%   BMI 27.57 kg/m  Vitals with BMI 10/17/2019 10/09/2019 10/09/2019  Height 5\' 10"  - -  Weight 192 lbs 2 oz - 188 lbs 15 oz  BMI XX123456 - XX123456  Systolic 123XX123 [id number] -  Diastolic 80 95 -  Pulse 83 56 -     Physical Exam Vitals and nursing note reviewed.  Constitutional:      Appearance: Normal appearance. He is well-developed, well-groomed and overweight.  HENT:     Head: Normocephalic and atraumatic.     Comments: Mask in place    Right Ear: External ear normal.     Left Ear: External ear normal.  Eyes:     General:        Right eye: No discharge.        Left eye: No discharge.     Conjunctiva/sclera: Conjunctivae normal.  Cardiovascular:     Rate and Rhythm: Normal rate and regular rhythm.     Pulses: Normal  pulses.     Heart sounds: Normal heart sounds.  Pulmonary:     Effort: Pulmonary effort is normal.     Breath sounds: Normal breath sounds.  Musculoskeletal:        General: Normal range of motion.     Cervical back: Normal range of motion and neck supple.  Skin:    General: Skin is warm.  Neurological:     General: No focal deficit present.     Mental Status: He is alert and oriented to person, place, and time.  Psychiatric:        Attention and Perception: Attention normal.  Mood and Affect: Mood normal.        Speech: Speech normal.        Behavior: Behavior normal. Behavior is cooperative.        Thought Content: Thought content normal.        Cognition and Memory: Cognition normal.        Judgment: Judgment normal.      Assessment   1. Encounter for support and coordination of transition of care   2. CKD (chronic kidney disease) stage 4, GFR 15-29 ml/min (HCC)   3. Renovascular hypertension   4. Other migraine without status migrainosus, not intractable     Tests ordered No orders of the defined types were placed in this encounter.   Plan: Please see assessment and plan per problem list above.   No orders of the defined types were placed in this encounter.   Patient to follow-up in follow-up as scheduled in February with Dr. Moshe Cipro.Perlie Mayo, NP

## 2019-10-17 NOTE — Assessment & Plan Note (Signed)
Since last fall he has been taking his medications as directed.  Reports trying to get his blood pressure under control.  Is trying to avoid going on dialysis so that might be impending.  Reports taking all medications without any issue or side effect.  Reports that the migraine is what caused the trigger of the blood pressure going up.  Does have follow-up with nephrology tomorrow.  Advised for him to continue to take all medications as prescribed.  Maintain a DASH diet and workout 30 minutes at least 5 days of the week.

## 2019-10-17 NOTE — Assessment & Plan Note (Signed)
As documented in the note.  Does not need any follow-up labs.  Is following up with nephrologist in the next 24 hours.  And has an appointment for pain management regarding migraines.  Reports taking all his medications as directed.  Blood pressure shows well controlled today in the office.  Advised him to continue take medications as directed.  If any changes are made with his nephrologist he is advised to contact us either MyChart or call the office.

## 2019-10-17 NOTE — Patient Instructions (Addendum)
Happy New Year! May you have a year filled with hope, love, happiness and laughter.  I appreciate the opportunity to provide you with care for your health and wellness. Today we discussed: Blood pressure/recent hospital visit  Follow up: 11/13/2019 as scheduled  No labs or referrals today  Blood pressure has improved tremendously! Please let me know if this is the case when you visit the kidney doctor tomorrow.   Please provide [place] with any medication changes that she might do.  Additionally might be good for her to see if there is anything that she can take safely for headache.  Please continue to practice social distancing to keep you, your family, and our community safe.  If you must go out, please wear a mask and practice good handwashing.  It was a pleasure to see you and I look forward to continuing to work together on your health and well-being. Please do not hesitate to call the office if you need care or have questions about your care.  Have a wonderful day and week. With Gratitude, Cherly Beach, DNP, AGNP-BC

## 2019-10-17 NOTE — Assessment & Plan Note (Signed)
Is going to be following up with nephrologist tomorrow.  Has no complaints today is taking his medication as directed does have controlled today hopefully he will have demonstrated control for her tomorrow.

## 2019-10-17 NOTE — Assessment & Plan Note (Signed)
Not well controlled possibly need something prophylactic.  Is on a beta-blocker now.  Maybe this will help with ongoing use.  Advised for him to follow-up with kidney doctor to see if there is anything specific that they would want [place] to have him on for prophylaxis that would not be damaging to kidneys.

## 2019-10-28 DIAGNOSIS — R112 Nausea with vomiting, unspecified: Secondary | ICD-10-CM | POA: Diagnosis not present

## 2019-10-28 DIAGNOSIS — N184 Chronic kidney disease, stage 4 (severe): Secondary | ICD-10-CM | POA: Diagnosis not present

## 2019-10-28 DIAGNOSIS — I129 Hypertensive chronic kidney disease with stage 1 through stage 4 chronic kidney disease, or unspecified chronic kidney disease: Secondary | ICD-10-CM | POA: Diagnosis not present

## 2019-10-29 ENCOUNTER — Telehealth: Payer: Self-pay

## 2019-10-29 ENCOUNTER — Ambulatory Visit: Payer: BC Managed Care – PPO | Admitting: Student in an Organized Health Care Education/Training Program

## 2019-10-29 NOTE — Telephone Encounter (Signed)
Virtual Visit Pre-Appointment Phone Call  "(Name), I am calling you today to discuss your upcoming appointment. We are currently trying to limit exposure to the virus that causes COVID-19 by seeing patients at home rather than in the office."  1. "What is the BEST phone number to call the day of the visit?" - include this in appointment notes  2. "Do you have or have access to (through a family member/friend) a smartphone with video capability that we can use for your visit?" a. If yes - list this number in appt notes as "cell" (if different from BEST phone #) and list the appointment type as a VIDEO visit in appointment notes b. If no - list the appointment type as a PHONE visit in appointment notes  Confirm consent - "In the setting of the current Covid19 crisis, you are scheduled for a (phone or video) visit with your provider on (date) at (time).  Just as we do with many in-office visits, in order for you to participate in this visit, we must obtain consent.  If you'd like, I can send this to your mychart (if signed up) or email for you to review.  Otherwise, I can obtain your verbal consent now.  All virtual visits are billed to your insurance company just like a normal visit would be.  By agreeing to a virtual visit, we'd like you to understand that the technology does not allow for your provider to perform an examination, and thus may limit your provider's ability to fully assess your condition. If your provider identifies any concerns that need to be evaluated in person, we will make arrangements to do so.  Finally, though the technology is pretty good, we cannot assure that it will always work on either your or our end, and in the setting of a video visit, we may have to convert it to a phone-only visit.  In either situation, we cannot ensure that we have a secure connection.  Are you willing to proceed?" STAFF: Did the patient verbally acknowledge consent to telehealth visit? Document  YES/NO here:  3. Advise patient to be prepared - "Two hours prior to your appointment, go ahead and check your blood pressure, pulse, oxygen saturation, and your weight (if you have the equipment to check those) and write them all down. When your visit starts, your provider will ask you for this information. If you have an Apple Watch or Kardia device, please plan to have heart rate information ready on the day of your appointment. Please have a pen and paper handy nearby the day of the visit as well."  4. Give patient instructions for MyChart download to smartphone OR Doximity/Doxy.me as below if video visit (depending on what platform provider is using)  5. Inform patient they will receive a phone call 15 minutes prior to their appointment time (may be from unknown caller ID) so they should be prepared to answer    [place] has been deemed a candidate for a follow-up tele-health visit to limit community exposure during the Covid-19 pandemic. I spoke with the patient via phone to ensure availability of phone/video source, confirm preferred email & phone number, and discuss instructions and expectations.  I reminded [name] to be prepared with any vital sign and/or heart rhythm information that could potentially be obtained via home monitoring, at the time of his visit. I reminded [name] to expect a phone call prior to his visit.  [name] 10/29/2019 9:35 AM   INSTRUCTIONS FOR DOWNLOADING THE MYCHART APP TO SMARTPHONE  - The patient must first make sure to have activated MyChart and know their login information - If Apple, go to App Store and type in MyChart in the search bar and download the app. If Android, ask patient to go to [employer] and type in [place] in the search bar and download the app. The app is free but as with any other app downloads, their phone may require them to verify saved payment information or Apple/Android  password.  - The patient will need to then log into the app with their MyChart username and password, and select [facility] as their healthcare provider to link the account. When it is time for your visit, go to the MyChart app, find appointments, and click Begin Video Visit. Be sure to Select Allow for your device to access the Microphone and Camera for your visit. You will then be connected, and your provider will be with you shortly.  **If they have any issues connecting, or need assistance please contact MyChart service desk [phone])**  **If using a computer, in order to ensure the best quality for their visit they will need to use either of the following Internet Browsers: [employer], or Google Chrome**  IF USING DOXIMITY or DOXY.ME - The patient will receive a link just prior to their visit by text.     FULL LENGTH CONSENT FOR TELE-HEALTH VISIT   I hereby voluntarily request, consent and authorize [facility] and its employed or contracted physicians, physician assistants, nurse practitioners or other licensed health care professionals (the Practitioner), to provide me with telemedicine health care services (the "Services") as deemed necessary by the treating Practitioner. I acknowledge and consent to receive the Services by the Practitioner via telemedicine. I understand that the telemedicine visit will involve communicating with the Practitioner through live audiovisual communication technology and the disclosure of certain medical information by electronic transmission. I acknowledge that I have been given the opportunity to request an in-person assessment or other available alternative prior to the telemedicine visit and am voluntarily participating in the telemedicine visit.  I understand that I have the right to withhold or withdraw my consent to the use of telemedicine in the course of my care at any time, without affecting my right to future care or treatment,  and that the Practitioner or I may terminate the telemedicine visit at any time. I understand that I have the right to inspect all information obtained and/or recorded in the course of the telemedicine visit and may receive copies of available information for a reasonable fee.  I understand that some of the potential risks of receiving the Services via telemedicine include:  Marland Kitchen Delay or interruption in medical evaluation due to technological equipment failure or disruption; . Information transmitted may not be sufficient (e.g. poor resolution of images) to allow for appropriate medical decision making by the Practitioner; and/or  . In rare instances, security protocols could fail, causing a breach of personal health information.  Furthermore, I acknowledge that it is my responsibility to provide information about my medical history, conditions and care that is complete and accurate to the best of my ability. I acknowledge that Practitioner's advice, recommendations, and/or decision may be based on factors not within their control, such as incomplete or inaccurate data provided by me or distortions of diagnostic images or specimens that may result from electronic transmissions. I understand that the practice  of medicine is not an Chief Strategy Officer and that Practitioner makes no warranties or guarantees regarding treatment outcomes. I acknowledge that I will receive a copy of this consent concurrently upon execution via email to the email address I last provided but may also request a printed copy by calling the office of [facility].    I understand that my insurance will be billed for this visit.   I have read or had this consent read to me. . I understand the contents of this consent, which adequately explains the benefits and risks of the Services being provided via telemedicine.  . I have been provided ample opportunity to ask questions regarding this consent and the Services and have had my questions  answered to my satisfaction. . I give my informed consent for the services to be provided through the use of telemedicine in my medical care  By participating in this telemedicine visit I agree to the above.

## 2019-10-30 ENCOUNTER — Encounter: Payer: Self-pay | Admitting: Nurse Practitioner

## 2019-10-30 ENCOUNTER — Encounter: Payer: Self-pay | Admitting: Cardiovascular Disease

## 2019-10-30 ENCOUNTER — Telehealth (INDEPENDENT_AMBULATORY_CARE_PROVIDER_SITE_OTHER): Payer: BC Managed Care – PPO | Admitting: Cardiovascular Disease

## 2019-10-30 VITALS — BP 150/98 | Ht 70.0 in | Wt 195.0 lb

## 2019-10-30 DIAGNOSIS — N184 Chronic kidney disease, stage 4 (severe): Secondary | ICD-10-CM

## 2019-10-30 DIAGNOSIS — Z9289 Personal history of other medical treatment: Secondary | ICD-10-CM

## 2019-10-30 DIAGNOSIS — I10 Essential (primary) hypertension: Secondary | ICD-10-CM

## 2019-10-30 NOTE — Progress Notes (Signed)
Virtual Visit via Telephone Note   This visit type was conducted due to national recommendations for restrictions regarding the COVID-19 Pandemic (e.g. social distancing) in an effort to limit this patient's exposure and mitigate transmission in our community.  Due to his co-morbid illnesses, this patient is at least at moderate risk for complications without adequate follow up.  This format is felt to be most appropriate for this patient at this time.  The patient did not have access to video technology/had technical difficulties with video requiring transitioning to audio format only (telephone).  All issues noted in this document were discussed and addressed.  No physical exam could be performed with this format.  Please refer to the patient's chart for his  consent to telehealth for [facility].   Date:  10/30/2019   ID:  [name], DOB [birth year], MRN [id number]  Patient Location: Home Provider Location: Home  PCP:  Fayrene Helper, MD  Cardiologist:  Kate Sable, MD  Electrophysiologist:  None   Evaluation Performed:  Follow-Up Visit  Chief Complaint: Malignant hypertension  History of Present Illness:    [name] is a 49 y.o. male with malignant hypertension and hypertensive heart disease.  He was hospitalized in September 2018.  At that time he underwent a negative stress test. Echocardiogram at that time demonstrated normal left ventricular systolic function and regional wall motion, LVEF 55-60%, severe LVH, and grade 1 diastolic dysfunction.  There was mild aortic regurgitation and a small pericardial effusion.  Renal artery Dopplers were normal on 06/15/2017.  He was hospitalized in January 2021 and prior to that in October 2020 for malignant hypertension.  Cardiology was consulted in October 2020 as he had tachycardia which appeared to be sinus tachycardia with PACs.  He has a mild headache. He seldom has chest pains. He has a lot of  stress.  He says his BP has fluctuated somewhat. BP is affected by chronic migraine headaches.   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 He also plays the drums.  Past Medical History:  Diagnosis Date  . Acute kidney injury ([place])   . Bilateral renal cysts 02/23/2014   Noted in 2013 initially   . Chronic back pain   . Duodenitis with bleeding 08/2008   Ulcer - admitted to [facility]   . Essential hypertension, benign   . GERD (gastroesophageal reflux disease)   . Headache(784.0)   . Hyperlipidemia   . Hypertensive urgency 10/08/2019  . Migraines   . Nonspecific abnormal electrocardiogram (ECG) (EKG) 08/30/2016  . Obesity, unspecified   . Sleep apnea   . Stress and adjustment reaction 11/04/2016   Past Surgical History:  Procedure Laterality Date  . 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   Procedure: 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.;  Surgeon: Jolyn Nap, MD;  Location: [place];  Service: Orthopedics;  Laterality: Left;  . CHOLECYSTECTOMY N/A 10/03/2018   Procedure: LAPAROSCOPIC CHOLECYSTECTOMY;  Surgeon: Aviva Signs, MD;  Location: [facility];  Service: General;  Laterality: N/A;  . FINGER DEBRIDEMENT Left 09/25/2013   Thumb  . FOOT FRACTURE SURGERY Right      Current Meds  Medication Sig  . amLODipine (NORVASC) 10 MG tablet Take 1 tablet (10 mg total) by mouth daily.  . cloNIDine (CATAPRES - DOSED IN MG/24 HR) 0.3 mg/24hr patch Place 1 patch (0.3 mg total) onto the skin once a week.  . esomeprazole (NEXIUM) 20 MG capsule Take  20 mg by mouth daily as needed (for acid reflux).   . hydrOXYzine (VISTARIL) 50 MG capsule Take 50 mg by mouth 2 (two) times daily as needed for anxiety or itching.   . metoprolol tartrate (LOPRESSOR) 25 MG tablet Take 1 tablet (25 mg total) by mouth 2 (two) times daily.  . ondansetron (ZOFRAN) 8 MG tablet Take 1 tablet (8 mg total) by mouth every 4 (four) hours as needed. (Patient taking  differently: Take 8 mg by mouth every 4 (four) hours as needed for nausea or vomiting. )  . spironolactone (ALDACTONE) 25 MG tablet Take 25 mg by mouth 2 (two) times daily.      Allergies:   Bee venom, Prochlorperazine edisylate, Cyclobenzaprine hcl, Aleve [naproxen sodium], Carbamazepine, Geodon [ziprasidone hcl], Ibuprofen, Maxzide [hydrochlorothiazide w-triamterene], Nsaids, Tylenol [acetaminophen], and Other   Social History   Tobacco Use  . Smoking status: Former Smoker    Packs/day: 0.10    Years: 15.00    Pack years: 1.50    Types: Cigarettes    Quit date: 09/15/2012    Years since quitting: 7.1  . Smokeless tobacco: Never Used  Substance Use Topics  . Alcohol use: No  . Drug use: No     Family Hx: The patient's family history includes Arthritis in his father; Diabetes in his father and mother; Hypertension in his father and mother.  ROS:   Please see the history of present illness.     All other systems reviewed and are negative.   Prior CV studies:   The following studies were reviewed today:  Reviewed above  Labs/Other Tests and Data Reviewed:    EKG:  An ECG dated 10/08/19 was personally reviewed today and demonstrated:  sinus rhythm with LAFB  Recent Labs: 10/09/2019: ALT 9; BUN 36; Creatinine, Ser 3.01; Hemoglobin 10.8; Magnesium 2.0; Platelets 254; Potassium 3.8; Sodium 142   Recent Lipid Panel Lab Results  Component Value Date/Time   CHOL 123 11/08/2018 10:17 AM   TRIG 134 11/08/2018 10:17 AM   HDL 47 11/08/2018 10:17 AM   CHOLHDL 2.6 11/08/2018 10:17 AM   LDLCALC 54 11/08/2018 10:17 AM    Wt Readings from Last 3 Encounters:  10/30/19 195 lb (88.5 kg)  10/17/19 192 lb 1.9 oz (87.1 kg)  10/09/19 188 lb 15 oz (85.7 kg)     Objective:    Vital Signs:  BP (!) 150/98   Ht 5\' 10"  (1.778 m)   Wt 195 lb (88.5 kg)   BMI 27.98 kg/m    VITAL SIGNS:  reviewed  ASSESSMENT & PLAN:    1.  Malignant hypertension: Blood pressure is mildly elevated.   This will need further monitoring.  He is on amlodipine, clonidine patch, and spironolactone.  2.  Chronic kidney disease stage IV: Followed by nephrology. Creatinine 3.01 on 10/09/19.    COVID-19 Education: The signs and symptoms of COVID-19 were discussed with the patient and how to seek care for testing (follow up with PCP or arrange E-visit).  The importance of social distancing was discussed today.  Time:   Today, I have spent 15 minutes with the patient with telehealth technology discussing the above problems.     Medication Adjustments/Labs and Tests Ordered: Current medicines are reviewed at length with the patient today.  Concerns regarding medicines are outlined above.   Tests Ordered: No orders of the defined types were placed in this encounter.   Medication Changes: No orders of the defined types were placed in this encounter.  Follow Up:  prn  Signed, Kate Sable, MD  10/30/2019 11:21 AM    [place]

## 2019-10-30 NOTE — Patient Instructions (Signed)
Medication Instructions:   Your physician recommends that you continue on your current medications as directed. Please refer to the Current Medication list given to you today.  Labwork:  none  Testing/Procedures:  none  Follow-Up:  Your physician recommends that you schedule a follow-up appointment in: as needed.  Any Other Special Instructions Will Be Listed Below (If Applicable).  If you need a refill on your cardiac medications before your next appointment, please call your pharmacy. 

## 2019-11-11 ENCOUNTER — Ambulatory Visit: Payer: BC Managed Care – PPO | Admitting: Nurse Practitioner

## 2019-11-11 ENCOUNTER — Encounter: Payer: Self-pay | Admitting: Nurse Practitioner

## 2019-11-11 ENCOUNTER — Telehealth: Payer: Self-pay | Admitting: Nurse Practitioner

## 2019-11-11 NOTE — Telephone Encounter (Signed)
PATIENT WAS A NO SHOW AND LETTER SENT  °

## 2019-11-11 NOTE — Progress Notes (Deleted)
Primary Care Physician:  Fayrene Helper, MD Primary Gastroenterologist:  Dr.   Rayne Du chief complaint on file.   HPI:   [name] is a 49 y.o. male who presents on referral from [facility] (nephrology) for nausea and vomiting.  Reviewed information provided with referral including ***.  Notes include scanned image from [facility] detailing EUS [phone] [id number] which found a large 3 cm ulcer at the bulb of the D2 junction status post biopsy with biopsies essentially unrevealing.  Also noted erosive duodenitis.  CT scan which found inflammatory changes at the lesion in the duodenal bulb and gallbladder fossa.  Subsequently followed up with [facility] later in 2009 with melanotic stools and significant abdominal pain likely due to not taking PPI as recommended.  Found to be H. pylori positive with repeat EGD which found persistent duodenal ulcer.  Has done well on PPI since then.  Today he states   Past Medical History:  Diagnosis Date  . Acute kidney injury ([place])   . Bilateral renal cysts 02/23/2014   Noted in 2013 initially   . Chronic back pain   . Duodenitis with bleeding 08/2008   Ulcer - admitted to [facility]   . Essential hypertension, benign   . GERD (gastroesophageal reflux disease)   . Headache(784.0)   . Hyperlipidemia   . Hypertensive urgency 10/08/2019  . Migraines   . Nonspecific abnormal electrocardiogram (ECG) (EKG) 08/30/2016  . Obesity, unspecified   . Sleep apnea   . Stress and adjustment reaction 11/04/2016    Past Surgical History:  Procedure Laterality Date  . 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   Procedure: 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.;  Surgeon: Jolyn Nap, MD;  Location: [place];  Service: Orthopedics;  Laterality: Left;  . CHOLECYSTECTOMY N/A 10/03/2018   Procedure: LAPAROSCOPIC CHOLECYSTECTOMY;  Surgeon: Aviva Signs, MD;  Location: [facility];  Service: General;   Laterality: N/A;  . FINGER DEBRIDEMENT Left 09/25/2013   Thumb  . FOOT FRACTURE SURGERY Right     Current Outpatient Medications  Medication Sig Dispense Refill  . amLODipine (NORVASC) 10 MG tablet Take 1 tablet (10 mg total) by mouth daily. 90 tablet 3  . cloNIDine (CATAPRES - DOSED IN MG/24 HR) 0.3 mg/24hr patch Place 1 patch (0.3 mg total) onto the skin once a week. 12 patch 0  . esomeprazole (NEXIUM) 20 MG capsule Take 20 mg by mouth daily as needed (for acid reflux).     . hydrOXYzine (VISTARIL) 50 MG capsule Take 50 mg by mouth 2 (two) times daily as needed for anxiety or itching.     . metoprolol tartrate (LOPRESSOR) 25 MG tablet Take 1 tablet (25 mg total) by mouth 2 (two) times daily. 60 tablet 1  . ondansetron (ZOFRAN) 8 MG tablet Take 1 tablet (8 mg total) by mouth every 4 (four) hours as needed. (Patient taking differently: Take 8 mg by mouth every 4 (four) hours as needed for nausea or vomiting. ) 10 tablet 0  . spironolactone (ALDACTONE) 25 MG tablet Take 25 mg by mouth 2 (two) times daily.      No current facility-administered medications for this visit.    Allergies as of 11/11/2019 - Review Complete 10/30/2019  Allergen Reaction Noted  . Bee venom Anaphylaxis 07/04/2011  . Prochlorperazine edisylate Other (See Comments) 01/07/2009  . Cyclobenzaprine hcl Other (See Comments) 01/07/2009  . Aleve [naproxen sodium] Nausea And Vomiting 07/10/2012  .  Carbamazepine    . Geodon [ziprasidone hcl] Nausea And Vomiting 08/31/2015  . Ibuprofen Nausea And Vomiting 07/10/2012  . Maxzide [hydrochlorothiazide w-triamterene]  02/19/2014  . Nsaids Other (See Comments) 06/21/2014  . Tylenol [acetaminophen] Nausea And Vomiting 07/10/2012  . Other Nausea And Vomiting, Rash, and Other (See Comments) 07/10/2012    Family History  Problem Relation Age of Onset  . Diabetes Mother   . Hypertension Mother   . Hypertension Father   . Arthritis Father   . Diabetes Father     Social  History   Socioeconomic History  . Marital status: Married    Spouse name: Not on file  . Number of children: 4  . Years of education: Not on file  . Highest education level: Not on file  Occupational History  . Occupation: Musician     Comment: [employer]  Tobacco Use  . Smoking status: Former Smoker    Packs/day: 0.10    Years: 15.00    Pack years: 1.50    Types: Cigarettes    Quit date: 09/15/2012    Years since quitting: 7.1  . Smokeless tobacco: Never Used  Substance and Sexual Activity  . Alcohol use: No  . Drug use: No  . Sexual activity: Yes  Other Topics Concern  . Not on file  Social History Narrative  . Not on file   Social Determinants of Health   Financial Resource Strain: Low Risk   . Difficulty of Paying Living Expenses: Not very hard  Food Insecurity: No Food Insecurity  . Worried About Charity fundraiser in the Last Year: Never true  . Ran Out of Food in the Last Year: Never true  Transportation Needs: No Transportation Needs  . Lack of Transportation (Medical): No  . Lack of Transportation (Non-Medical): No  Physical Activity: Unknown  . Days of Exercise per Week: Patient refused  . Minutes of Exercise per Session: Patient refused  Stress:   . Feeling of Stress : Not on file  Social Connections: Unknown  . Frequency of Communication with Friends and Family: Patient refused  . Frequency of Social Gatherings with Friends and Family: Patient refused  . Attends Religious Services: Patient refused  . Active Member of Clubs or Organizations: Patient refused  . Attends Archivist Meetings: Patient refused  . Marital Status: Patient refused  Intimate Partner Violence: Not At Risk  . Fear of Current or Ex-Partner: No  . Emotionally Abused: No  . Physically Abused: No  . Sexually Abused: No    Review of Systems: General: Negative for anorexia, weight loss, fever, chills, fatigue, weakness. Eyes: Negative for vision  changes.  ENT: Negative for hoarseness, difficulty swallowing , nasal congestion. CV: Negative for chest pain, angina, palpitations, dyspnea on exertion, peripheral edema.  Respiratory: Negative for dyspnea at rest, dyspnea on exertion, cough, sputum, wheezing.  GI: See history of present illness. GU:  Negative for dysuria, hematuria, urinary incontinence, urinary frequency, nocturnal urination.  MS: Negative for joint pain, low back pain.  Derm: Negative for rash or itching.  Neuro: Negative for weakness, abnormal sensation, seizure, frequent headaches, memory loss, confusion.  Psych: Negative for anxiety, depression, suicidal ideation, hallucinations.  Endo: Negative for unusual weight change.  Heme: Negative for bruising or bleeding. Allergy: Negative for rash or hives.    Physical Exam: There were no vitals taken for this visit. General:   Alert and oriented. Pleasant and cooperative. Well-nourished and well-developed.  Head:  Normocephalic and atraumatic.  Eyes:  Without icterus, sclera clear and conjunctiva pink.  Ears:  Normal auditory acuity. Mouth:  No deformity or lesions, oral mucosa pink.  Throat/Neck:  Supple, without mass or thyromegaly. Cardiovascular:  S1, S2 present without murmurs appreciated. Normal pulses noted. Extremities without clubbing or edema. Respiratory:  Clear to auscultation bilaterally. No wheezes, rales, or rhonchi. No distress.  Gastrointestinal:  +BS, soft, non-tender and non-distended. No HSM noted. No guarding or rebound. No masses appreciated.  Rectal:  Deferred  Musculoskalatal:  Symmetrical without gross deformities. Normal posture. Skin:  Intact without significant lesions or rashes. Neurologic:  Alert and oriented x4;  grossly normal neurologically. Psych:  Alert and cooperative. Normal mood and affect. Heme/Lymph/Immune: No significant cervical adenopathy. No excessive bruising noted.    11/11/2019 7:46 AM   Disclaimer: This note was  dictated with voice recognition software. Similar sounding words can inadvertently be transcribed and may not be corrected upon review.

## 2019-11-13 ENCOUNTER — Other Ambulatory Visit: Payer: Self-pay

## 2019-11-13 ENCOUNTER — Encounter: Payer: Self-pay | Admitting: Family Medicine

## 2019-11-13 ENCOUNTER — Ambulatory Visit (INDEPENDENT_AMBULATORY_CARE_PROVIDER_SITE_OTHER): Payer: BC Managed Care – PPO | Admitting: Family Medicine

## 2019-11-13 VITALS — BP 150/98 | Ht 70.0 in | Wt 195.0 lb

## 2019-11-13 DIAGNOSIS — Z125 Encounter for screening for malignant neoplasm of prostate: Secondary | ICD-10-CM

## 2019-11-13 DIAGNOSIS — R9402 Abnormal brain scan: Secondary | ICD-10-CM

## 2019-11-13 DIAGNOSIS — I10 Essential (primary) hypertension: Secondary | ICD-10-CM | POA: Diagnosis not present

## 2019-11-13 DIAGNOSIS — N184 Chronic kidney disease, stage 4 (severe): Secondary | ICD-10-CM | POA: Diagnosis not present

## 2019-11-13 DIAGNOSIS — G43809 Other migraine, not intractable, without status migrainosus: Secondary | ICD-10-CM | POA: Diagnosis not present

## 2019-11-13 DIAGNOSIS — E559 Vitamin D deficiency, unspecified: Secondary | ICD-10-CM

## 2019-11-13 DIAGNOSIS — I150 Renovascular hypertension: Secondary | ICD-10-CM

## 2019-11-13 MED ORDER — OXYCODONE-ACETAMINOPHEN 7.5-325 MG PO TABS: ORAL_TABLET | ORAL | 0 refills | Status: DC

## 2019-11-13 MED ORDER — HYDROXYZINE PAMOATE 50 MG PO CAPS: ORAL_CAPSULE | ORAL | 2 refills | Status: DC

## 2019-11-13 NOTE — Patient Instructions (Addendum)
F/u IN OFFICE WITH Md IN 4 MONTHS, CALL IF YOU NEED ME SOONER  kEEP APPOINTM,ENTS FOR kIDNEY sPECIALIST AND PAIN MANAFGGEMENT PLEASE  I AM REFERRING YOU TO nEUROLOGY RE HEADACHE MANAGEMENT   NEED fASTING LIPID, CMP AND EgfR , PSA, TSH AND VIT D 1 WEEK BEFORE FOLLOW UP  SHORT COURSE OF PAIN MEDICATION IS PRESCRIBED ONE TIME ONLY  Think about what you will eat, plan ahead. Choose " clean, green, fresh or frozen" over canned, processed or packaged foods which are more sugary, salty and fatty. 70 to 75% of food eaten should be vegetables and fruit. Three meals at set times with snacks allowed between meals, but they must be fruit or vegetables. Aim to eat over a 12 hour period , example 7 am to 7 pm, and STOP after  your last meal of the day. Drink water,generally about 64 ounces per day, no other drink is as healthy. Fruit juice is best enjoyed in a healthy way, by EATING the fruit.  Thanks for choosing [facility], we consider it a privelige to serve you.

## 2019-11-13 NOTE — Progress Notes (Signed)
Virtual Visit via Telephone Note  I connected with Ancil Boozer on 11/13/19 at  3:40 PM EST by telephone and verified that I am speaking with the correct person using two identifiers.  Location: Patient: home Provider: office   I discussed the limitations, risks, security and privacy concerns of performing an evaluation and management service by telephone and the availability of in person appointments. I also discussed with the patient that there may be a patient responsible charge related to this service. The patient expressed understanding and agreed to proceed.   History of Present Illness: F/U chronic problems, medication review, and refill medication when necessary. Review most recent labs and order labs which are due Review preventive health and update with necessary referrals or immunizations as indicated Being followed by Nephrology and Cardiology Denies recent fever or chills. Denies sinus pressure, nasal congestion, ear pain or sore throat. Denies chest congestion, productive cough or wheezing. Denies chest pains, palpitations and leg swelling Denies abdominal pain, nausea, vomiting,diarrhea or constipation.   Denies dysuria, frequency, hesitancy or incontinence. Denies joint pain, swelling and limitation in mobility. C/o more frequent and severe headaches,  Has referral to pain clinic for narcotic management of same, however needs follow up wit Neurology had abnormal scan in the past with possible aneurysm, and has chronic disabling headaches as a primary complaint Denies depression, anxiety or insomnia. Denies skin break down or rash.       Observations/Objective: BP (!) 150/98   Ht 5\' 10"  (1.778 m)   Wt 195 lb (88.5 kg)   BMI 27.98 kg/m  Good communication with no confusion and intact memory. Alert and oriented x 3 No signs of respiratory distress during speech    Assessment and Plan:  Malignant hypertension Improved b ut still not at goal DASH diet and  commitment to daily physical activity for a minimum of 30 minutes discussed and encouraged, as a part of hypertension management. The importance of attaining a healthy weight is also discussed.  BP/Weight 11/13/2019 10/30/2019 10/17/2019 10/09/2019 08/13/2019 07/10/2019 [id number]  Systolic BP Q000111Q Q000111Q 123XX123 A999333 [id number] [id number]  Diastolic BP 98 98 80 95 A999333 100 115  Wt. (Lbs) 195 195 192.12 188.93 195.08 199.12 189.82  BMI 27.98 27.98 27.57 27.9 28.81 29.4 28.03       Migraines Chronic and uncontrolled. Refer to Neurology for management . In the interim hydrocodone 1 daily is prescribed for 30 days only and patient understands this. Has scheduled appt with pain management who will take over narcotic scripts and he understands this   CKD (chronic kidney disease) stage 4, GFR 15-29 ml/min (HCC) Importance of nephrology follow up is stressed  Hyperlipidemia LDL goal <100 Hyperlipidemia:Low fat diet discussed and encouraged.   Lipid Panel  Lab Results  Component Value Date   CHOL 123 11/08/2018   HDL 47 11/08/2018   LDLCALC 54 11/08/2018   TRIG 134 11/08/2018   CHOLHDL 2.6 11/08/2018        Follow Up Instructions:    I discussed the assessment and treatment plan with the patient. The patient was provided an opportunity to ask questions and all were answered. The patient agreed with the plan and demonstrated an understanding of the instructions.   The patient was advised to call back or seek an in-person evaluation if the symptoms worsen or if the condition fails to improve as anticipated.  I provided 22 minutes of non-face-to-face time during this encounter.   Tula Nakayama, MD

## 2019-11-17 ENCOUNTER — Encounter: Payer: Self-pay | Admitting: Family Medicine

## 2019-11-17 NOTE — Assessment & Plan Note (Signed)
Hyperlipidemia:Low fat diet discussed and encouraged.   Lipid Panel  Lab Results  Component Value Date   CHOL 123 11/08/2018   HDL 47 11/08/2018   LDLCALC 54 11/08/2018   TRIG 134 11/08/2018   CHOLHDL 2.6 11/08/2018

## 2019-11-17 NOTE — Assessment & Plan Note (Signed)
Importance of nephrology follow up is stressed

## 2019-11-17 NOTE — Assessment & Plan Note (Signed)
Chronic and uncontrolled. Refer to Neurology for management . In the interim hydrocodone 1 daily is prescribed for 30 days only and patient understands this. Has scheduled appt with pain management who will take over narcotic scripts and he understands this

## 2019-11-17 NOTE — Assessment & Plan Note (Signed)
Improved b ut still not at goal DASH diet and commitment to daily physical activity for a minimum of 30 minutes discussed and encouraged, as a part of hypertension management. The importance of attaining a healthy weight is also discussed.  BP/Weight 11/13/2019 10/30/2019 10/17/2019 10/09/2019 08/13/2019 07/10/2019 [id number]  Systolic BP Q000111Q Q000111Q 123XX123 A999333 123XX123 [id number] A999333  Diastolic BP 98 98 80 95 A999333 100 115  Wt. (Lbs) 195 195 192.12 188.93 195.08 199.12 189.82  BMI 27.98 27.98 27.57 27.9 28.81 29.4 28.03

## 2019-11-18 ENCOUNTER — Encounter: Payer: Self-pay | Admitting: Neurology

## 2019-11-18 ENCOUNTER — Encounter: Payer: Self-pay | Admitting: Family Medicine

## 2019-11-27 ENCOUNTER — Encounter: Payer: Self-pay | Admitting: Nurse Practitioner

## 2019-11-27 ENCOUNTER — Other Ambulatory Visit: Payer: Self-pay

## 2019-11-27 ENCOUNTER — Ambulatory Visit (INDEPENDENT_AMBULATORY_CARE_PROVIDER_SITE_OTHER): Payer: BC Managed Care – PPO | Admitting: Nurse Practitioner

## 2019-11-27 ENCOUNTER — Encounter: Payer: Self-pay | Admitting: *Deleted

## 2019-11-27 ENCOUNTER — Other Ambulatory Visit: Payer: Self-pay | Admitting: *Deleted

## 2019-11-27 VITALS — BP 157/97 | HR 91 | Temp 97.7°F | Ht 69.0 in | Wt 191.4 lb

## 2019-11-27 DIAGNOSIS — K219 Gastro-esophageal reflux disease without esophagitis: Secondary | ICD-10-CM

## 2019-11-27 DIAGNOSIS — R634 Abnormal weight loss: Secondary | ICD-10-CM | POA: Diagnosis not present

## 2019-11-27 DIAGNOSIS — R110 Nausea: Secondary | ICD-10-CM | POA: Insufficient documentation

## 2019-11-27 DIAGNOSIS — R1310 Dysphagia, unspecified: Secondary | ICD-10-CM

## 2019-11-27 DIAGNOSIS — R112 Nausea with vomiting, unspecified: Secondary | ICD-10-CM | POA: Diagnosis not present

## 2019-11-27 DIAGNOSIS — R1319 Other dysphagia: Secondary | ICD-10-CM

## 2019-11-27 MED ORDER — ESOMEPRAZOLE MAGNESIUM 20 MG PO CPDR: 20.0000 mg | DELAYED_RELEASE_CAPSULE | Freq: Two times a day (BID) | ORAL | 3 refills | Status: AC

## 2019-11-27 MED ORDER — NA SULFATE-K SULFATE-MG SULF 17.5-3.13-1.6 GM/177ML PO SOLN: 1.0000 | Freq: Once | ORAL | 0 refills | Status: AC

## 2019-11-27 MED ORDER — PROMETHAZINE HCL 12.5 MG PO TABS: 12.5000 mg | ORAL_TABLET | Freq: Four times a day (QID) | ORAL | 0 refills | Status: DC | PRN

## 2019-11-27 NOTE — Progress Notes (Signed)
Primary Care Physician:  Fayrene Helper, MD Primary Gastroenterologist:  Dr. Gala Romney  Chief Complaint  Patient presents with  . Abdominal Pain    n/v, hx of gallbladder removal a year or so ago    HPI:   [name] is a 49 y.o. male who presents on referral from nephrology for nausea and vomiting.  Previous office visit scheduled for 11/11/2019 but the patient was no-show.  Reviewed information provided with referral including office visit dated 10/28/2019.  At that time noted history of apparent malignant hypertension.  Also history of migraines, GERD, advanced chronic kidney disease.  Creatinine is close to 3, GFR 26.  Has missed several doses of his hydralazine due to nausea.  Nausea seems to be associated with episodes of high blood pressure, unsure which happens first.  Previous GI evaluation for nausea and vomiting (unsure where) and is on Zofran.  At the end of the visit they recommended Zofran 8 mg ODT 3 times a day as needed, referral to GI.  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  Recommended EGD with MAC anesthesia.  It appears the EGD was completed at [facility] on 03/09/2009.  Findings included healed duodenal ulcer, edematous lumen.  Recommended consider future surgical need if symptoms persist.  The patient recently saw primary care 11/13/2019 for multiple complaints.  At that time denied nausea and vomiting, abdominal pain or constipation.  More frequent headaches, referral to pain clinic for management.  Also needs follow-up with neurology.  Today he states he's doing ok overall. Has had nausea and "crazy abdominal symptoms" since before his cholecystectomy, thought GB removal would help. Since surgery his symptoms have worsened. Has nausea about 2-3 times a week, almost always in the morning. Some vomiting, last episode was about  a week ago. GERD doing "pretty good" if he takes his medicine. Having "knott, like a ball" in his epigastrium which is painful; not necessarily associated with his nausea. Does have some delayed transit/solid food dysphagia (dones't go down unless I drink a lot of fluids). No regurgitation. Has regular bowel movements consistent with bristol 2-4; rare diarrhea. No significant straining. Denies hematochezia, melena, fever, chills. States he has lost about 25 lbs subjectively over the past 6 months. Denies URI or flu-like symptoms. Denies loss of sense of taste or smell. Was tested for COVID-19 about 3 months ago and was negative. Denies chest pain, dyspnea, dizziness, lightheadedness, syncope, near syncope. Denies any other upper or lower GI symptoms.  Denies NSAIDs, ASA powders. Zofran helps his nausea somewhat, but it gives him headaches.  Has never had a colonoscopy before.  Past Medical History:  Diagnosis Date  . Acute kidney injury ([place])   . Bilateral renal cysts 02/23/2014   Noted in 2013 initially   . Chronic back pain   . Depression, major, single episode, severe ([place]) 08/13/2018   pHQ 9 score of 24, not suicidal or homicidal, score of 8 in 09/2018, no medication or therapy as of this time  . Duodenitis with bleeding 08/2008   Ulcer - admitted to [facility]   . Essential hypertension, benign   . GERD (gastroesophageal reflux disease)   . Headache(784.0)   . Hyperlipidemia   . Hypertensive urgency 10/08/2019  . Migraines   . Nonspecific abnormal electrocardiogram (ECG) (EKG) 08/30/2016  . Obesity, unspecified   . Sleep apnea   .  Stress and adjustment reaction 11/04/2016    Past Surgical History:  Procedure Laterality Date  . 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 CHOLECYSTECTOMY N/A 10/03/2018   Procedure:  LAPAROSCOPIC CHOLECYSTECTOMY;  Surgeon: Aviva Signs, MD;  Location: [facility];  Service: General;  Laterality: N/A;  . FINGER DEBRIDEMENT Left 09/25/2013   Thumb  . FOOT FRACTURE SURGERY Right     Current Outpatient Medications  Medication Sig Dispense Refill  . amLODipine (NORVASC) 10 MG tablet Take 1 tablet (10 mg total) by mouth daily. 90 tablet 3  . cloNIDine (CATAPRES - DOSED IN MG/24 HR) 0.3 mg/24hr patch Place 1 patch (0.3 mg total) onto the skin once a week. 12 patch 0  . esomeprazole (NEXIUM) 20 MG capsule Take 20 mg by mouth daily as needed (for acid reflux).     . hydrOXYzine (VISTARIL) 50 MG capsule TAKE ONE CAPSULE BY MOUTH TWO TIMES DAILY AS NEEDED, FOR ANXIETY 60 capsule 2  . metoprolol tartrate (LOPRESSOR) 25 MG tablet Take 1 tablet (25 mg total) by mouth 2 (two) times daily. 60 tablet 1  . ondansetron (ZOFRAN) 8 MG tablet Take 1 tablet (8 mg total) by mouth every 4 (four) hours as needed. (Patient taking differently: Take 8 mg by mouth as needed for nausea or vomiting. ) 10 tablet 0  . oxyCODONE-acetaminophen (PERCOCET) 7.5-325 MG tablet Take one tablet by mouth one time daily, as needed, for headache 30 tablet 0  . spironolactone (ALDACTONE) 25 MG tablet Take 25 mg by mouth 2 (two) times daily.      No current facility-administered medications for this visit.    Allergies as of 11/27/2019 - Review Complete 11/27/2019  Allergen Reaction Noted  . Bee venom Anaphylaxis 07/04/2011  . Prochlorperazine edisylate Other (See Comments) 01/07/2009  . Cyclobenzaprine hcl Other (See Comments) 01/07/2009  . Aleve [naproxen sodium] Nausea And Vomiting 07/10/2012  . Carbamazepine    . Geodon [ziprasidone hcl] Nausea And Vomiting 08/31/2015  . Ibuprofen Nausea And Vomiting 07/10/2012  . Maxzide [hydrochlorothiazide w-triamterene]  02/19/2014  . Nsaids Other (See Comments) 06/21/2014  . Tylenol [acetaminophen] Nausea And Vomiting 07/10/2012  . Other Nausea And Vomiting, Rash, and  Other (See Comments) 07/10/2012    Family History  Problem Relation Age of Onset  . Diabetes Mother   . Hypertension Mother   . Hypertension Father   . Arthritis Father   . Diabetes Father   . Colon cancer Neg Hx   . Gastric cancer Neg Hx   . Esophageal cancer Neg Hx     Social History   Socioeconomic History  . Marital status: Married    Spouse name: Not on file  . Number of children: 4  . Years of education: Not on file  . Highest education level: Not on file  Occupational History  . Occupation: Musician     Comment: [employer]  Tobacco Use  . Smoking status: Former Smoker    Packs/day: 0.10    Years: 15.00    Pack years: 1.50    Types: Cigarettes    Quit date: 09/15/2012    Years since quitting: 7.2  . Smokeless tobacco: Never Used  Substance and Sexual Activity  . Alcohol use: No  . Drug use: No  . Sexual activity: Yes  Other Topics Concern  . Not on  file  Social History Narrative  . Not on file   Social Determinants of Health   Financial Resource Strain: Low Risk   . Difficulty of Paying Living Expenses: Not very hard  Food Insecurity: No Food Insecurity  . Worried About Charity fundraiser in the Last Year: Never true  . Ran Out of Food in the Last Year: Never true  Transportation Needs: No Transportation Needs  . Lack of Transportation (Medical): No  . Lack of Transportation (Non-Medical): No  Physical Activity: Unknown  . Days of Exercise per Week: Patient refused  . Minutes of Exercise per Session: Patient refused  Stress:   . Feeling of Stress : Not on file  Social Connections: Unknown  . Frequency of Communication with Friends and Family: Patient refused  . Frequency of Social Gatherings with Friends and Family: Patient refused  . Attends Religious Services: Patient refused  . Active Member of Clubs or Organizations: Patient refused  . Attends Archivist Meetings: Patient refused  . Marital Status: Patient refused    Intimate Partner Violence: Not At Risk  . Fear of Current or Ex-Partner: No  . Emotionally Abused: No  . Physically Abused: No  . Sexually Abused: No    Review of Systems: General: Negative for anorexia, weight loss, fever, chills, fatigue, weakness. ENT: Negative for hoarseness, difficulty swallowing. CV: Negative for chest pain, angina, palpitations, peripheral edema.  Respiratory: Negative for dyspnea at rest, cough, sputum, wheezing.  GI: See history of present illness.  MS: Negative for joint pain, low back pain.  Derm: Negative for rash or itching.   Endo: Negative for unusual weight change.  Heme: Negative for bruising or bleeding. Allergy: Negative for rash or hives.    Physical Exam: BP (!) 157/97   Pulse 91   Temp 97.7 F (36.5 C) (Temporal)   Ht _0  (1.753 m)   Wt 191 lb 6.4 oz (86.8 kg)   BMI 28.26 kg/m  General:   Alert and oriented. Pleasant and cooperative. Well-nourished and well-developed.  Head:  Normocephalic and atraumatic. Eyes:  Without icterus, sclera clear and conjunctiva pink.  Ears:  Normal auditory acuity. Cardiovascular:  S1, S2 present without murmurs appreciated. Extremities without clubbing or edema. Respiratory:  Clear to auscultation bilaterally. No wheezes, rales, or rhonchi. No distress.  Gastrointestinal:  +BS, soft, and non-distended. Mild to moderate abdominal TTP. No HSM noted. No guarding or rebound. No masses appreciated.  Rectal:  Deferred  Musculoskalatal:  Symmetrical without gross deformities. Neurologic:  Alert and oriented x4;  grossly normal neurologically. Psych:  Alert and cooperative. Normal mood and affect. Heme/Lymph/Immune: No excessive bruising noted.    11/27/2019 3:49 PM   Disclaimer: This note was dictated with voice recognition software. Similar sounding words can inadvertently be transcribed and may not be corrected upon review.

## 2019-11-27 NOTE — Patient Instructions (Addendum)
Your health issues we discussed today were:   Chronic GERD (reflux/heartburn) with abdominal pain and history of ulcer: 1. I have sent a new prescription to your pharmacy to increase Nexium to twice a day. 2. Take this first thing in the morning on an empty stomach and then 30 minutes before your last meal the day 3. Call us if you have any worsening or severe symptoms 4. We will set up your upper endoscopy with possible dilation for you 5. Further recommendations will follow  Nausea with vomiting: 1. If your GERD symptoms are out of control, this can cause worsening nausea and vomiting as well as abdominal pain 2. I am sorry Zofran causes headaches.  I have sent a prescription for Phenergan 12.5 mg tablets to your pharmacy 3. You can take this every 6 hours, as needed 4. Call us if you have any problems or worsening/severe symptoms  Overall I recommend:  1. Continue your other current medications 2. Return for follow-up in 3 months 3. Call us if you have any questions or concerns.   ---------------------------------------------------------------  COVID-19 Vaccine Information can be found at: ShippingScam.co.uk For questions related to vaccine distribution or appointments, please email [email or web address] or call 9194709491.   ---------------------------------------------------------------   At [facility] we value your feedback. You may receive a survey about your visit today. Please share your experience as we strive to create trusting relationships with our patients to provide genuine, compassionate, quality care.  We appreciate your understanding and patience as we review any laboratory studies, imaging, and other diagnostic tests that are ordered as we care for you. Our office policy is 5 business days for review of these results, and any emergent or urgent results are addressed in a timely manner for  your best interest. If you do not hear from our office in 1 week, please contact us.   We also encourage the use of MyChart, which contains your medical information for your review as well. If you are not enrolled in this feature, an access code is on this after visit summary for your convenience. Thank you for allowing [place] to be involved in your care.  It was great to see you today!  I hope you have a great day!!

## 2019-11-28 ENCOUNTER — Encounter: Payer: Self-pay | Admitting: Internal Medicine

## 2019-11-28 ENCOUNTER — Telehealth: Payer: Self-pay | Admitting: *Deleted

## 2019-11-28 MED ORDER — CLENPIQ 10-3.5-12 MG-GM -GM/160ML PO SOLN: 1.0000 | Freq: Once | ORAL | 0 refills | Status: AC

## 2019-11-28 NOTE — Telephone Encounter (Signed)
Received fax from pharmacy suprep is not covered. Clenpiq is preferred. New Rx sent in. I have mailed patient new instructions with his pre-op/covid testing appt.

## 2019-11-29 NOTE — Assessment & Plan Note (Signed)
The patient notes unintentional weight loss of 25 pounds over the past 6 months.  He is 49 years old and has never had a colonoscopy.  Having solid food dysphagia as per HPI and per below.  At this point given the significant weight loss we will plan for colonoscopy at the time of his EGD for further evaluation of possible GI etiologies weight loss.  Further management as per below.  Follow-up in 3 months.  Proceed with TCS on propofol/MAC with Dr. Gala Romney in near future: the risks, benefits, and alternatives have been discussed with the patient in detail. The patient states understanding and desires to proceed.  The patient is on oxycodone. The patient is not on any other anticoagulants, anxiolytics, chronic pain medications, antidepressants, antidiabetics, or iron supplements.  We will plan for the procedure on propofol/MAC to promote adequate sedation

## 2019-11-29 NOTE — Assessment & Plan Note (Signed)
Noted dysphagia symptoms without regurgitation.  Chronic GERD.  Previous duodenal ulcer at [facility] which eventually was healed.  Denies NSAIDs.  Worsening GERD type symptoms including epigastric pain, nausea, vomiting.  Given his dysphagia will plan for an upper endoscopy with possible dilation.  Further recommendations to follow.  Follow-up in 3 months.  Proceed with EGD +/- dilation on propofol/MAC with Dr. Gala Romney in near future: the risks, benefits, and alternatives have been discussed with the patient in detail. The patient states understanding and desires to proceed.  The patient is currently on oxycodone.  The patient is not on any other anticoagulants, anxiolytics, chronic pain medications, antidepressants, antidiabetics, or iron supplements.  We will plan for the procedure on propofol/MAC to promote adequate sedation.

## 2019-11-29 NOTE — Assessment & Plan Note (Signed)
The patient admits nausea and vomiting about 2-3 times a week which is always in the morning.  Last episode of actual emesis was a week ago.  Chronic GERD with symptoms reminiscent of poor control.  Management of GERD per below which I feel will likely improve his nausea symptoms.  We can circle back around if his GERD symptoms are improved and he has subsequent improvement in nausea, no further work-up needed.  In the interim I will give him Phenergan 12.5 mg every 8 hours as needed for symptomatic control.  Follow-up in 3 months.

## 2019-11-29 NOTE — Assessment & Plan Note (Signed)
GERD symptoms not ideally controlled given new onset dysphagia and epigastric pain as well as some intermittent nausea.  I will increase his Nexium to twice daily to see if this has an effect.  Further evaluation with EGD as per below.  Follow-up in 3 months.

## 2019-12-10 ENCOUNTER — Telehealth: Payer: Self-pay | Admitting: *Deleted

## 2019-12-10 NOTE — Progress Notes (Signed)
Patient: [name]  Service Category: E/M  Provider: Gillis Santa, MD  DOB: Dec 04, 1970  DOS: 12/12/2019  Location: Office  MRN: [id number]  Setting: Ambulatory outpatient  Referring Provider: Perlie Mayo, NP  Type: New Patient  Specialty: Interventional Pain Management  PCP: Fayrene Helper, MD  Location: Home  Delivery: TeleHealth     Virtual Encounter - Pain Management PROVIDER NOTE: Information contained herein reflects review and annotations entered in association with encounter. Interpretation of such information and data should be left to medically-trained personnel. Information provided to patient can be located elsewhere in the medical record under "Patient Instructions". Document created using STT-dictation technology, any transcriptional errors that may result from process are unintentional.    Contact & Pharmacy Preferred: [phone] Home: [phone] (home) Mobile: [phone] (mobile) E-mail: [email or web address]  [place], [place] [place] [address] Phone: [phone] Fax: [phone]   Pre-screening note:  Our staff contacted [name] and offered him an "in person", "face-to-face" appointment versus a telephone encounter. He indicated preferring the telephone encounter, at this time.  Primary Reason(s) for Visit: Tele-Encounter for initial evaluation of one or more chronic problems (new to examiner) potentially causing chronic pain, and posing a threat to normal musculoskeletal function. (Level of risk: High)  CC: migraine  I contacted [name] on 12/12/2019 via telephone.      I clearly identified myself as Gillis Santa, MD. I verified that I was speaking with the correct person using two identifiers (Name: [name], and date of birth: Feb 05, 1971).  This visit was completed via telephone due to the restrictions of the COVID-19  pandemic. All issues as above were discussed and addressed but no physical exam was performed. If it was felt that the patient should be evaluated in the office, they were directed there. The patient verbally consented to this visit. Patient was unable to complete an audio/visual visit due to Technical difficulties and/or Lack of internet. Due to the catastrophic nature of the COVID-19 pandemic, this visit was done through audio contact only.  Location of the patient: home address (see Epic for details)  Location of the provider: office  Advanced Informed Consent I sought verbal advanced consent from [name] for virtual visit interactions. I informed [name] of possible security and privacy concerns, risks, and limitations associated with providing "not-in-person" medical evaluation and management services. I also informed [name] of the availability of "in-person" appointments. Finally, I informed him that there would be a charge for the virtual visit and that he could be  personally, fully or partially, financially responsible for it. [name] expressed understanding and agreed to proceed.   HPI  [name] is a 49 y.o. year old, male patient, contacted today for an initial evaluation of his chronic pain. He has Malignant hypertension; Fatigue due to sleep pattern disturbance; Migraines; CKD (chronic kidney disease) stage 4, GFR 15-29 ml/min (HCC); Sleep disorder; Hypertensive cardiomyopathy ([place]); GERD (gastroesophageal reflux disease); Cholelithiasis; Hypertension; Atrial premature depolarization; Nausea with vomiting; Unintentional weight loss; and Dysphagia on their problem list.   Onset and Duration: Gradual and Present longer than 3 months Cause of pain: migraines, hypertension Severity: No change since onset, NAS-11 at its worse: 10/10, NAS-11 at its best: 5/10, NAS-11 now: 10/10 and NAS-11 on the average: 8/10 Timing: Morning Aggravating Factors: Bending and  sunlight, some smells Alleviating Factors: Medications  Associated Problems: Nausea, Vomiting  and Weakness Quality of Pain: Pulsating and Throbbing Previous Examinations or Tests: CT scan, EMG/PNCV, MRI scan and Neurological evaluation Previous Treatments: Narcotic medications  This patient presents with chronic migraines that it been refractory to conservative management as detailed below.  Discussion had with the patient regarding preventative treatment for migraines. These preventative treatments are indicated when the patient has frequent or long lasting migraine headaches, migraine attacks or cause significant disability or diminished quality of life despite appropriate acute treatment, contraindication to acute therapies, failure of acute therapies, serious adverse effects of acute therapies, risk of medication overuse headache and menstrual migraine.  The main goals of preventative therapy as discussed with the patient include reduce attack frequency, severity, duration. Improve responsiveness to treatment of acute attacks, improve function and reduce disability, prevent progression of episodic migraine to chronic migraine.  Greater than 15 headache days (if yes, how many): Yes, 20-21 days  Headaches last greater than 4 hours?: Yes, all day   With half of those headaches having migrainous criteria? Yes  Headache descriptors: Pulsating, unilateral (left), photophobia, phonophobia, nausea or vomiting  Pain intensity (associated with headaches): Moderate or severe  Medication overuse has been ruled out as a cause of the patient's chronic migraines.   -Topamax, Maxalt, Triptan Therapy, Ibuprofen, Aleve, Gabapentin, Cymbalta, Lyrica,  Norvasc, Metoprolol  -failed treatment with above medications   Meds   Current Outpatient Medications:  .  amLODipine (NORVASC) 10 MG tablet, Take 1 tablet (10 mg total) by mouth daily. (Patient taking differently: Take 10 mg by mouth in the morning  and at bedtime. ), Disp: 90 tablet, Rfl: 3 .  cloNIDine (CATAPRES - DOSED IN MG/24 HR) 0.3 mg/24hr patch, Place 1 patch (0.3 mg total) onto the skin once a week., Disp: 12 patch, Rfl: 0 .  esomeprazole (NEXIUM) 20 MG capsule, Take 1 capsule (20 mg total) by mouth 2 (two) times daily before a meal., Disp: 60 capsule, Rfl: 3 .  hydrOXYzine (VISTARIL) 50 MG capsule, TAKE ONE CAPSULE BY MOUTH TWO TIMES DAILY AS NEEDED, FOR ANXIETY, Disp: 60 capsule, Rfl: 2 .  metoprolol tartrate (LOPRESSOR) 25 MG tablet, Take 1 tablet (25 mg total) by mouth 2 (two) times daily., Disp: 60 tablet, Rfl: 1 .  ondansetron (ZOFRAN ODT) 4 MG disintegrating tablet, Take 1 tablet (4 mg total) by mouth every 8 (eight) hours as needed for nausea or vomiting., Disp: 20 tablet, Rfl: 0 .  promethazine (PHENERGAN) 12.5 MG tablet, Take 1 tablet (12.5 mg total) by mouth every 6 (six) hours as needed for nausea or vomiting., Disp: 30 tablet, Rfl: 0 .  spironolactone (ALDACTONE) 25 MG tablet, Take 25 mg by mouth 2 (two) times daily. , Disp: , Rfl:   ROS  Cardiovascular: High blood pressure Pulmonary or Respiratory: No reported pulmonary signs or symptoms such as wheezing and difficulty taking a deep full breath (Asthma), difficulty blowing air out (Emphysema), coughing up mucus (Bronchitis), persistent dry cough, or temporary stoppage of breathing during sleep Neurological: No reported neurological signs or symptoms such as seizures, abnormal skin sensations, urinary and/or fecal incontinence, being born with an abnormal open spine and/or a tethered spinal cord Psychological-Psychiatric: Difficulty sleeping and or falling asleep Gastrointestinal: Reflux or heatburn Genitourinary: No reported renal or genitourinary signs or symptoms such as difficulty voiding or producing urine, peeing blood, non-functioning kidney, kidney stones, difficulty emptying the bladder, difficulty controlling the flow of urine, or chronic kidney  disease Hematological: No reported hematological signs or symptoms  such as prolonged bleeding, low or poor functioning platelets, bruising or bleeding easily, hereditary bleeding problems, low energy levels due to low hemoglobin or being anemic Endocrine: No reported endocrine signs or symptoms such as high or low blood sugar, rapid heart rate due to high thyroid levels, obesity or weight gain due to slow thyroid or thyroid disease Rheumatologic: No reported rheumatological signs and symptoms such as fatigue, joint pain, tenderness, swelling, redness, heat, stiffness, decreased range of motion, with or without associated rash Musculoskeletal: Negative for myasthenia gravis, muscular dystrophy, multiple sclerosis or malignant hyperthermia Work History: Working part time  Allergies  [name] is allergic to bee venom; prochlorperazine edisylate; cyclobenzaprine hcl; aleve [naproxen sodium]; carbamazepine; geodon [ziprasidone hcl]; ibuprofen; maxzide [hydrochlorothiazide w-triamterene]; nsaids; tylenol [acetaminophen]; and other.  Laboratory Chemistry Profile   Renal Lab Results  Component Value Date   BUN 46 (H) 12/11/2019   CREATININE 3.47 (H) 12/11/2019   BCR 9 11/08/2018   GFRAA 22 (L) 12/11/2019   GFRNONAA 19 (L) 12/11/2019   SPECGRAV 1.025 04/07/2011   PHUR 6.0 04/07/2011   PROTEINUR NEGATIVE 12/11/2019    Electrolytes Lab Results  Component Value Date   NA 139 12/11/2019   K 4.1 12/11/2019   CL 104 12/11/2019   CALCIUM 9.5 12/11/2019   MG 2.0 10/09/2019   PHOS 3.4 06/27/2019    Hepatic Lab Results  Component Value Date   AST 27 12/11/2019   ALT 16 12/11/2019   ALBUMIN 5.0 12/11/2019   ALKPHOS 73 12/11/2019   AMYLASE 112 (H) 06/25/2019   LIPASE 45 12/11/2019    ID Lab Results  Component Value Date   HIV NON REACTIVE 06/25/2019   [place] NEGATIVE 10/08/2019   MRSAPCR NEGATIVE 06/26/2019   HCVAB <0.1 05/23/2017    Bone Lab Results  Component Value Date    VD25OH 15.7 (L) 01/06/2018    Endocrine Lab Results  Component Value Date   GLUCOSE 123 (H) 12/11/2019   GLUCOSEU NEGATIVE 12/11/2019   HGBA1C 4.6 (L) 06/25/2019   TSH 1.177 01/06/2018   CRTSLPL 42.8 06/25/2019    Neuropathy Lab Results  Component Value Date   HGBA1C 4.6 (L) 06/25/2019   HIV NON REACTIVE 06/25/2019    CNS No results found for: COLORCSF, APPEARCSF, RBCCOUNTCSF, WBCCSF, POLYSCSF, LYMPHSCSF, EOSCSF, PROTEINCSF, GLUCCSF, JCVIRUS, CSFOLI, IGGCSF, LABACHR, ACETBL, LABACHR, ACETBL  Inflammation (CRP: Acute  ESR: Chronic) Lab Results  Component Value Date   LATICACIDVEN 1.8 06/25/2019    Rheumatology Lab Results  Component Value Date   ANA Negative 05/23/2017    Coagulation Lab Results  Component Value Date   INR 1.1 06/25/2019   LABPROT 14.5 06/25/2019   APTT 30 06/25/2019   PLT 308 12/11/2019   DDIMER 0.31 08/31/2016    Cardiovascular Lab Results  Component Value Date   BNP 6.2 12/28/2016   CKTOTAL 121 06/27/2019   TROPONINI <0.03 01/19/2019   HGB 12.1 (L) 12/11/2019   HCT 36.5 (L) 12/11/2019    Screening Lab Results  Component Value Date   SARSCOV2NAA NEGATIVE 10/08/2019   MRSAPCR NEGATIVE 06/26/2019   HCVAB <0.1 05/23/2017   HIV NON REACTIVE 06/25/2019    Cancer No results found for: CEA, CA125, LABCA2  Allergens No results found for: ALMOND, APPLE, ASPARAGUS, AVOCADO, BANANA, BARLEY, BASIL, BAYLEAF, GREENBEAN, LIMABEAN, WHITEBEAN, BEEFIGE, REDBEET, BLUEBERRY, BROCCOLI, CABBAGE, MELON, CARROT, CASEIN, CASHEWNUT, CAULIFLOWER, CELERY    Note: Lab results reviewed.   Imaging Review    Shoulder-L DG:  Results for orders placed during the hospital  encounter of 09/06/18  DG Shoulder Left   Narrative CLINICAL DATA:  Patient with left shoulder pain.  No known injury.  EXAM: LEFT SHOULDER - 2+ VIEW  COMPARISON:  None.  FINDINGS: Normal anatomic alignment. No evidence for acute fracture or dislocation. 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  IMPRESSION: No acute osseous abnormality.   Electronically Signed   By: Lovey Newcomer M.D.   On: 09/06/2018 18:37    Lumbar DG (Complete) 4+V:  Results for orders placed during the hospital encounter of 03/10/08  DG Lumbar Spine Complete   Narrative Clinical Data: Low back pain - no known injury   LUMBAR SPINE - COMPLETE 4+ VIEW   Comparison: No priors   Findings: No fracture or subluxations.  Disc height fairly well maintained.  No significant degenerative changes.  Soft tissues unremarkable.   IMPRESSION: No acute or significant findings.  Provider: Jennye Boroughs          Results for orders placed during the hospital encounter of 01/13/16  DG Ankle Complete Left   Narrative CLINICAL DATA:  [place] last night, twisting ankle. Lateral pain and swelling.  EXAM: LEFT ANKLE COMPLETE - 3+ VIEW  COMPARISON:  None.  FINDINGS: There is no evidence of fracture, dislocation, or joint effusion. There is no evidence of arthropathy or other focal bone abnormality. Soft tissues are unremarkable.  IMPRESSION: Negative.   Electronically Signed   By: Rolm Baptise M.D.   On: 01/13/2016 11:13     Foot Imaging: Foot-R DG Complete:  Results for orders placed during the hospital encounter of 08/30/04  DG Foot Complete Right   Narrative   History: Pain, crush injury   RIGHT FOOT 3 VIEWS:   Oblique lucency medial cuneiform suspicious for nondisplaced fracture.  This is seen only on the [facility] view and is not definitely confirmed on the lateral nor oblique views.   Soft tissue swelling and midfoot ending into the metatarsal region. Joint spaces preserved. No additional fracture or dislocation identified. Mineralization normal.   IMPRESSION: Question nondisplaced fracture of medial cuneiform. Recommend CT to evaluate.  Provider: Jennye Boroughs   Foot-L DG Complete:  Results for orders placed during the hospital encounter of 01/13/16  DG Foot  Complete Left   Narrative CLINICAL DATA:  Pain and swelling of the left foot and ankle after a fall last night.  EXAM: LEFT FOOT - COMPLETE 3+ VIEW  COMPARISON:  12/29/2006  FINDINGS: There is no evidence of fracture or dislocation. There is no evidence of arthropathy or other focal bone abnormality. Soft tissue swelling around the ankle.  IMPRESSION: Soft tissue swelling.  Otherwise normal exam.   Electronically Signed   By: Lorriane Shire M.D.   On: 01/13/2016 11:10     Elbow Imaging: Elbow-R DG Complete:  Results for orders placed in visit on 01/06/01  DG Elbow Complete Right   Narrative FINDINGS CLINICAL DATA: 47-YEAR-OLD WITH SWELLING AND PAIN AFTER INJURY. RIGHT  ELBOW (3 VIEWS): THERE IS NO EVIDENCE OF FRACTURE, DISLOCATION, OR OTHER SIGNIFICANT BONE ABNORMALITY  THERE IS NO EVIDENCE OF JOINT EFFUSION. IMPRESSION     Hand Imaging: Hand-R DG Complete:  Results for orders placed during the hospital encounter of 01/31/13  DG Hand Complete Right   Narrative *RADIOLOGY REPORT*  Clinical Data: Hand pain.  RIGHT HAND - COMPLETE 3+ VIEW  Comparison: 03/28/2007  Findings: No acute bony abnormality.  Specifically, no fracture, subluxation, or dislocation.  Soft tissues are intact. Joint spaces are maintained.  Normal bone mineralization.  IMPRESSION: Negative.   Original Report Authenticated By: Rolm Baptise, M.D.    Hand-L DG Complete:  Results for orders placed during the hospital encounter of 09/25/13  DG Hand Complete Left   Narrative CLINICAL DATA:  Degloving injury to left thumb  EXAM: LEFT HAND - COMPLETE 3+ VIEW  COMPARISON:  Prior radiograph from 07/10/2012  FINDINGS: Extensive soft tissue laceration with injury is seen at the left from. There is a comminuted fracture of the left 1st distal phalanx with approximately 1 cm of radial displacement. Additional fracture fragment is seen at the radial aspect of the 1st IP joint. The  bone appears exposed to the external environment. There is extensive soft tissue swelling without definite radiopaque foreign body. The 1st MCP joint remains approximated.  Remainder of the left hand is intact without evidence of acute traumatic injury.  IMPRESSION: Soft-tissue degloving injury with acute open comminuted fracture of the 1st distal phalanx with radial displacement.   Electronically Signed   By: Jeannine Boga M.D.   On: 09/25/2013 23:03     Complexity Note: Imaging results reviewed. Results shared with [name], using Layman's terms.                         PFSH  Drug: [name]  reports no history of drug use. Alcohol:  reports no history of alcohol use. Tobacco:  reports that he quit smoking about 7 years ago. His smoking use included cigarettes. He has a 1.50 pack-year smoking history. He has never used smokeless tobacco. Medical:  has a past medical history of Acute kidney injury ([place]), Bilateral renal cysts (02/23/2014), Chronic back pain, Depression, major, single episode, severe ([place]) (08/13/2018), Duodenitis with bleeding (08/2008), Essential hypertension, benign, GERD (gastroesophageal reflux disease), Headache(784.0), Hyperlipidemia, Hypertensive urgency (10/08/2019), Migraines, Nonspecific abnormal electrocardiogram (ECG) (EKG) (08/30/2016), Obesity, unspecified, Sleep apnea, and Stress and adjustment reaction (11/04/2016). Family: family history includes Arthritis in his father; Diabetes in his father and mother; Hypertension in his father and mother.  Past Surgical History:  Procedure Laterality Date  . 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 CHOLECYSTECTOMY N/A 10/03/2018   Procedure: LAPAROSCOPIC CHOLECYSTECTOMY;  Surgeon: Aviva Signs, MD;  Location: [facility];  Service: General;   Laterality: N/A;  . FINGER DEBRIDEMENT Left 09/25/2013   Thumb  . FOOT FRACTURE SURGERY Right    Active Ambulatory Problems    Diagnosis Date Noted  . Malignant hypertension 10/09/2007  . Fatigue due to sleep pattern disturbance 10/20/2008  . Migraines 10/09/2007  . CKD (chronic kidney disease) stage 4, GFR 15-29 ml/min (HCC) 02/23/2014  . Sleep disorder 11/05/2014  . Hypertensive cardiomyopathy ([place]) 05/21/2017  . GERD (gastroesophageal reflux disease) 01/06/2018  . Cholelithiasis 07/30/2018  . Hypertension 06/25/2019  . Atrial premature depolarization   . Nausea with vomiting 11/27/2019  . Unintentional weight loss 11/27/2019  . Dysphagia 11/27/2019   Resolved Ambulatory Problems    Diagnosis Date Noted  . Current smoker 10/20/2008  . HYPERTENSION 10/09/2007  . CHRN/UNS DUOD ULCR W/HEMORR&PERF W/O OBST 10/20/2008  . PEPTIC ULCER DISEASE, HELICOBACTER PYLORI POSITIVE 01/07/2009  . BACK PAIN, CHRONIC 04/15/2008  . Rash and other nonspecific skin eruption 01/11/2009  . EPIGASTRIC PAIN 04/15/2008  . ABDOMINAL PAIN OTHER SPECIFIED SITE 06/02/2008  . Cystitis 04/07/2011  .  Nicotine dependence 04/01/2012  . Renal insufficiency 04/08/2012  . Degloving injury of finger 09/25/2013  . Nocturia 02/19/2014  . Bilateral renal cysts 02/23/2014  . Precordial pain 06/18/2014  . Obesity (BMI 30.0-34.9) 06/21/2014  . Hyperlipidemia LDL goal <100 08/08/2015  . Encounter for examination following treatment at hospital 05/16/2016  . Nonspecific abnormal electrocardiogram (ECG) (EKG) 08/30/2016  . Nausea and vomiting 09/12/2016  . Stress and adjustment reaction 11/04/2016  . Bilateral leg edema 12/26/2016  . Elevated troponin 05/21/2017  . Hypertensive urgency 01/06/2018  . Severe headache 01/06/2018  . Hypokalemia 01/06/2018  . Headache 01/07/2018  . Depression, major, single episode, severe ([place]) 08/13/2018  . Acute kidney injury ([place])   . Sepsis due to undetermined organism ([place])    . Hypertensive urgency 10/08/2019  . Intractable vomiting 10/09/2019  . Encounter for support and coordination of transition of care 10/17/2019   Past Medical History:  Diagnosis Date  . Chronic back pain   . Duodenitis with bleeding 08/2008  . Essential hypertension, benign   . Headache(784.0)   . Hyperlipidemia   . Obesity, unspecified   . Sleep apnea    Assessment  Primary Diagnosis & Pertinent Problem List: The encounter diagnosis was Chronic migraine without aura without status migrainosus, not intractable.  Visit Diagnosis (New problems to examiner): 1. Chronic migraine without aura without status migrainosus, not intractable    Plan of Care (Initial workup plan)   Patient has failed conservative treatment for migraines as detailed above. Discussed botox therapy for migraines, patient will think about it and let us know if he would like to proceed.  Provider-requested follow-up: Return prn BOTOX.  Future Appointments  Date Time Provider [place]  01/09/2020  1:50 PM Pieter Partridge, DO [facility] None  02/06/2020  2:45 PM [facility] PAT 1 [facility] None  02/06/2020  4:00 PM [facility]-SCREENING [facility] None  03/09/2020  1:40 PM Fayrene Helper, MD [facility]  03/18/2020 10:30 AM Carlis Stable, NP [facility]   Total duration of encounter: 25 minutes.  Primary Care Physician: Fayrene Helper, MD Note by: Gillis Santa, MD Date: 12/12/2019; Time: 2:54 PM

## 2019-12-10 NOTE — Telephone Encounter (Signed)
Called for new patient pre visit assessment. No answer at both numbers given. LVM at one number, the other number- MOBILE- VM box was full.

## 2019-12-11 ENCOUNTER — Other Ambulatory Visit: Payer: Self-pay

## 2019-12-11 ENCOUNTER — Emergency Department (HOSPITAL_COMMUNITY): Payer: BC Managed Care – PPO

## 2019-12-11 ENCOUNTER — Emergency Department (HOSPITAL_COMMUNITY)
Admission: EM | Admit: 2019-12-11 | Discharge: 2019-12-12 | Disposition: A | Discharge status: Home / Self Care | Payer: BC Managed Care – PPO | Attending: Emergency Medicine | Admitting: Emergency Medicine

## 2019-12-11 ENCOUNTER — Encounter (HOSPITAL_COMMUNITY): Payer: Self-pay | Admitting: Emergency Medicine

## 2019-12-11 DIAGNOSIS — Z87891 Personal history of nicotine dependence: Secondary | ICD-10-CM | POA: Diagnosis not present

## 2019-12-11 DIAGNOSIS — I10 Essential (primary) hypertension: Secondary | ICD-10-CM

## 2019-12-11 DIAGNOSIS — R519 Headache, unspecified: Secondary | ICD-10-CM | POA: Diagnosis not present

## 2019-12-11 DIAGNOSIS — R112 Nausea with vomiting, unspecified: Secondary | ICD-10-CM | POA: Diagnosis not present

## 2019-12-11 DIAGNOSIS — Z79899 Other long term (current) drug therapy: Secondary | ICD-10-CM | POA: Insufficient documentation

## 2019-12-11 DIAGNOSIS — R1310 Dysphagia, unspecified: Secondary | ICD-10-CM | POA: Diagnosis not present

## 2019-12-11 LAB — URINALYSIS, ROUTINE W REFLEX MICROSCOPIC
Bacteria, UA: NONE SEEN
Bilirubin Urine: NEGATIVE
Glucose, UA: NEGATIVE mg/dL
Ketones, ur: NEGATIVE mg/dL
Leukocytes,Ua: NEGATIVE
Nitrite: NEGATIVE
Protein, ur: NEGATIVE mg/dL
Specific Gravity, Urine: 1.016 (ref 1.005–1.030)
pH: 5 (ref 5.0–8.0)

## 2019-12-11 LAB — COMPREHENSIVE METABOLIC PANEL
ALT: 16 U/L (ref 0–44)
AST: 27 U/L (ref 15–41)
Albumin: 5 g/dL (ref 3.5–5.0)
Alkaline Phosphatase: 73 U/L (ref 38–126)
Anion gap: 12 (ref 5–15)
BUN: 46 mg/dL — ABNORMAL HIGH (ref 6–20)
CO2: 23 mmol/L (ref 22–32)
Calcium: 9.5 mg/dL (ref 8.9–10.3)
Chloride: 104 mmol/L (ref 98–111)
Creatinine, Ser: 3.47 mg/dL — ABNORMAL HIGH (ref 0.61–1.24)
GFR calc Af Amer: 22 mL/min — ABNORMAL LOW (ref >60)
GFR calc non Af Amer: 19 mL/min — ABNORMAL LOW (ref >60)
Glucose, Bld: 123 mg/dL — ABNORMAL HIGH (ref 70–99)
Potassium: 4.1 mmol/L (ref 3.5–5.1)
Sodium: 139 mmol/L (ref 135–145)
Total Bilirubin: 1 mg/dL (ref 0.3–1.2)
Total Protein: 8.2 g/dL — ABNORMAL HIGH (ref 6.5–8.1)

## 2019-12-11 LAB — CBC WITH DIFFERENTIAL/PLATELET
Abs Immature Granulocytes: 0.03 10*3/uL (ref 0.00–0.07)
Basophils Absolute: 0 10*3/uL (ref 0.0–0.1)
Basophils Relative: 0 %
Eosinophils Absolute: 0 10*3/uL (ref 0.0–0.5)
Eosinophils Relative: 0 %
HCT: 36.5 % — ABNORMAL LOW (ref 39.0–52.0)
Hemoglobin: 12.1 g/dL — ABNORMAL LOW (ref 13.0–17.0)
Immature Granulocytes: 0 %
Lymphocytes Relative: 4 %
Lymphs Abs: 0.3 10*3/uL — ABNORMAL LOW (ref 0.7–4.0)
MCH: 32.4 pg (ref 26.0–34.0)
MCHC: 33.2 g/dL (ref 30.0–36.0)
MCV: 97.6 fL (ref 80.0–100.0)
Monocytes Absolute: 0.2 10*3/uL (ref 0.1–1.0)
Monocytes Relative: 2 %
Neutro Abs: 8.9 10*3/uL — ABNORMAL HIGH (ref 1.7–7.7)
Neutrophils Relative %: 94 %
Platelets: 308 10*3/uL (ref 150–400)
RBC: 3.74 MIL/uL — ABNORMAL LOW (ref 4.22–5.81)
RDW: 10.9 % — ABNORMAL LOW (ref 11.5–15.5)
WBC: 9.4 10*3/uL (ref 4.0–10.5)
nRBC: 0 % (ref 0.0–0.2)

## 2019-12-11 LAB — LIPASE, BLOOD: Lipase: 45 U/L (ref 11–51)

## 2019-12-11 MED ORDER — ONDANSETRON HCL 4 MG/2ML IJ SOLN
4.0000 mg | Freq: Once | INTRAMUSCULAR | Status: AC
  Administered 2019-12-11: 19:00:00 4 mg via INTRAVENOUS
  Filled 2019-12-11: qty 2

## 2019-12-11 MED ORDER — METOPROLOL TARTRATE 25 MG PO TABS: 25.0000 mg | ORAL_TABLET | Freq: Two times a day (BID) | ORAL | Status: DC

## 2019-12-11 MED ORDER — PROMETHAZINE HCL 25 MG/ML IJ SOLN
25.0000 mg | Freq: Once | INTRAMUSCULAR | Status: AC
  Administered 2019-12-11: 25 mg via INTRAVENOUS
  Filled 2019-12-11: qty 1

## 2019-12-11 NOTE — ED Provider Notes (Signed)
[facility] EMERGENCY DEPARTMENT Provider Note   CSN: [id number] Arrival date & time: 12/11/19  1453     History Chief Complaint  Patient presents with  . Emesis    [name] is a 49 y.o. male.  The history is provided by the patient. No language interpreter was used.  Emesis Severity:  Moderate Duration:  1 day Timing:  Constant Number of daily episodes:  5 Able to tolerate:  Liquids Progression:  Partially resolved Chronicity:  New Recent urination:  Normal Relieved by:  Nothing Worsened by:  Nothing Ineffective treatments:  None tried Associated symptoms: no abdominal pain   Risk factors: no alcohol use and no suspect food intake    Pt complains of vomiting x 5.  Pt reports his blood pressure is up because he could not take his medication.     Past Medical History:  Diagnosis Date  . Acute kidney injury ([place])   . Bilateral renal cysts 02/23/2014   Noted in 2013 initially   . Chronic back pain   . Depression, major, single episode, severe ([place]) 08/13/2018   pHQ 9 score of 24, not suicidal or homicidal, score of 8 in 09/2018, no medication or therapy as of this time  . Duodenitis with bleeding 08/2008   Ulcer - admitted to [facility]   . Essential hypertension, benign   . GERD (gastroesophageal reflux disease)   . Headache(784.0)   . Hyperlipidemia   . Hypertensive urgency 10/08/2019  . Migraines   . Nonspecific abnormal electrocardiogram (ECG) (EKG) 08/30/2016  . Obesity, unspecified   . Sleep apnea   . Stress and adjustment reaction 11/04/2016    Patient Active Problem List   Diagnosis Date Noted  . Nausea with vomiting 11/27/2019  . Unintentional weight loss 11/27/2019  . Dysphagia 11/27/2019  . Hypertension 06/25/2019  . Atrial premature depolarization   . Cholelithiasis 07/30/2018  . GERD (gastroesophageal reflux disease) 01/06/2018  . Hypertensive cardiomyopathy ([place]) 05/21/2017  . Sleep disorder 11/05/2014  . CKD (chronic kidney  disease) stage 4, GFR 15-29 ml/min (HCC) 02/23/2014  . Fatigue due to sleep pattern disturbance 10/20/2008  . Malignant hypertension 10/09/2007  . Migraines 10/09/2007    Past Surgical History:  Procedure Laterality Date  . 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   Procedure: 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.;  Surgeon: Jolyn Nap, MD;  Location: [place];  Service: Orthopedics;  Laterality: Left;  . CHOLECYSTECTOMY N/A 10/03/2018   Procedure: LAPAROSCOPIC CHOLECYSTECTOMY;  Surgeon: Aviva Signs, MD;  Location: [facility];  Service: General;  Laterality: N/A;  . FINGER DEBRIDEMENT Left 09/25/2013   Thumb  . FOOT FRACTURE SURGERY Right        Family History  Problem Relation Age of Onset  . Diabetes Mother   . Hypertension Mother   . Hypertension Father   . Arthritis Father   . Diabetes Father   . Colon cancer Neg Hx   . Gastric cancer Neg Hx   . Esophageal cancer Neg Hx     Social History   Tobacco Use  . Smoking status: Former Smoker    Packs/day: 0.10    Years: 15.00    Pack years: 1.50    Types: Cigarettes    Quit date: 09/15/2012    Years since quitting: 7.2  . Smokeless tobacco: Never Used  Substance Use Topics  . Alcohol use: No  . Drug use: No    Home Medications Prior to Admission medications  Medication Sig Start Date End Date Taking? Authorizing Provider  amLODipine (NORVASC) 10 MG tablet Take 1 tablet (10 mg total) by mouth daily. 09/16/19   Fayrene Helper, MD  cloNIDine (CATAPRES - DOSED IN MG/24 HR) 0.3 mg/24hr patch Place 1 patch (0.3 mg total) onto the skin once a week. 09/16/19   Fayrene Helper, MD  esomeprazole (NEXIUM) 20 MG capsule Take 1 capsule (20 mg total) by mouth 2 (two) times daily before a meal. 11/27/19   Carlis Stable, NP  hydrOXYzine (VISTARIL) 50 MG capsule TAKE ONE CAPSULE BY MOUTH TWO TIMES DAILY AS NEEDED, FOR ANXIETY 11/13/19   Fayrene Helper, MD  metoprolol tartrate (LOPRESSOR) 25 MG tablet  Take 1 tablet (25 mg total) by mouth 2 (two) times daily. 10/09/19   Johnson, Clanford L, MD  ondansetron (ZOFRAN) 8 MG tablet Take 1 tablet (8 mg total) by mouth every 4 (four) hours as needed. Patient taking differently: Take 8 mg by mouth as needed for nausea or vomiting.  01/19/19   Nat Christen, MD  oxyCODONE-acetaminophen [facility]) 7.5-325 MG tablet Take one tablet by mouth one time daily, as needed, for headache 11/13/19 12/13/19  Fayrene Helper, MD  promethazine (PHENERGAN) 12.5 MG tablet Take 1 tablet (12.5 mg total) by mouth every 6 (six) hours as needed for nausea or vomiting. 11/27/19   Carlis Stable, NP  spironolactone (ALDACTONE) 25 MG tablet Take 25 mg by mouth 2 (two) times daily.  07/24/19   [provider]    Allergies    Bee venom, Prochlorperazine edisylate, Cyclobenzaprine hcl, Aleve [naproxen sodium], Carbamazepine, Geodon [ziprasidone hcl], Ibuprofen, Maxzide [hydrochlorothiazide w-triamterene], Nsaids, Tylenol [acetaminophen], and Other  Review of Systems   Review of Systems  Gastrointestinal: Positive for vomiting. Negative for abdominal pain.  All other systems reviewed and are negative.   Physical Exam Updated Vital Signs BP (!) 165/100 (BP Location: Right Arm)   Pulse 69   Temp 98.9 F (37.2 C) (Oral)   Resp 18   Ht 5\' 9"  (1.753 m)   Wt 86.8 kg   SpO2 100%   BMI 28.26 kg/m   Physical Exam Vitals and nursing note reviewed.  Constitutional:      Appearance: He is well-developed.  HENT:     Head: Normocephalic and atraumatic.     Right Ear: Tympanic membrane normal.     Left Ear: Tympanic membrane normal.     Nose: Nose normal.     Mouth/Throat:     Mouth: Mucous membranes are moist.  Eyes:     Conjunctiva/sclera: Conjunctivae normal.  Cardiovascular:     Rate and Rhythm: Normal rate and regular rhythm.     Heart sounds: No murmur.  Pulmonary:     Effort: Pulmonary effort is normal. No respiratory distress.     Breath sounds: Normal  breath sounds.  Abdominal:     Palpations: Abdomen is soft.     Tenderness: There is no abdominal tenderness.  Musculoskeletal:        General: Normal range of motion.     Cervical back: Neck supple.  Skin:    General: Skin is warm and dry.  Neurological:     General: No focal deficit present.     Mental Status: He is alert.  Psychiatric:        Mood and Affect: Mood normal.     ED Results / Procedures / Treatments   Labs (all labs ordered are listed, but only abnormal results are  displayed) Labs Reviewed  CBC WITH DIFFERENTIAL/PLATELET - Abnormal; Notable for the following components:      Result Value   RBC 3.74 (*)    Hemoglobin 12.1 (*)    HCT 36.5 (*)    RDW 10.9 (*)    Neutro Abs 8.9 (*)    Lymphs Abs 0.3 (*)    All other components within normal limits  COMPREHENSIVE METABOLIC PANEL - Abnormal; Notable for the following components:   Glucose, Bld 123 (*)    BUN 46 (*)    Creatinine, Ser 3.47 (*)    Total Protein 8.2 (*)    GFR calc non Af Amer 19 (*)    GFR calc Af Amer 22 (*)    All other components within normal limits  URINALYSIS, ROUTINE W REFLEX MICROSCOPIC - Abnormal; Notable for the following components:   Hgb urine dipstick SMALL (*)    All other components within normal limits  LIPASE, BLOOD    EKG None  Radiology No results found.  Procedures Procedures (including critical care time)  Medications Ordered in ED Medications  promethazine (PHENERGAN) injection 25 mg (has no administration in time range)  ondansetron (ZOFRAN) injection 4 mg (4 mg Intravenous Given 12/11/19 1916)    ED Course  I have reviewed the triage vital signs and the nursing notes.  Pertinent labs & imaging results that were available during my care of the patient were reviewed by me and considered in my medical decision making (see chart for details).    MDM Rules/Calculators/A&P                      MDM: Pt given zofran IV. Pt  Continues to have nausea.  Pt gi;ven  phenergan IV.  Pt unable to take oral medications.  Pt given Iv ns x 1 liter.   Final Clinical Impression(s) / ED Diagnoses Final diagnoses:  Hypertension, unspecified type  Nausea and vomiting, intractability of vomiting not specified, unspecified vomiting type    Rx / DC Orders ED Discharge Orders         Ordered    ondansetron (ZOFRAN ODT) 4 MG disintegrating tablet  Every 8 hours PRN     12/12/19 0032        An After Visit Summary was printed and given to the patient.    Fransico Meadow, PA-C 12/12/19 Sharl Ma    Milton Ferguson, MD 12/16/19 [phone]

## 2019-12-11 NOTE — ED Notes (Addendum)
Pt in bed with eyes closed resps even and unlabored.

## 2019-12-11 NOTE — ED Notes (Addendum)
Pt in bed with the blanket over his head, pt states that he can't drink anything and is still having nausea, pt then rolled over and appears to be resting comfortably.

## 2019-12-11 NOTE — ED Notes (Signed)
Pt in bed with eyes closed, pt arouses to verbal stim, pt states that he can't attempt po challenge at this time, states that he still has nausea.  Pt also reports headache.

## 2019-12-11 NOTE — ED Triage Notes (Signed)
Patient c/o of vomiting with hypertension that began this morning. Patient cannot tell me how many times he has vomited.

## 2019-12-11 NOTE — ED Notes (Addendum)
Iv phenergan diluted and given through running iv per admin instructions.

## 2019-12-11 NOTE — ED Notes (Signed)
Pt refusing PO metoprolol at this time.

## 2019-12-11 NOTE — Discharge Instructions (Addendum)
See your Physician for recheck.  °

## 2019-12-12 ENCOUNTER — Ambulatory Visit
Payer: BC Managed Care – PPO | Attending: Student in an Organized Health Care Education/Training Program | Admitting: Student in an Organized Health Care Education/Training Program

## 2019-12-12 ENCOUNTER — Encounter: Payer: Self-pay | Admitting: Student in an Organized Health Care Education/Training Program

## 2019-12-12 DIAGNOSIS — G43709 Chronic migraine without aura, not intractable, without status migrainosus: Secondary | ICD-10-CM

## 2019-12-12 LAB — RAPID URINE DRUG SCREEN, HOSP PERFORMED
Amphetamines: NOT DETECTED
Barbiturates: NOT DETECTED
Benzodiazepines: NOT DETECTED
Cocaine: NOT DETECTED
Opiates: POSITIVE — AB
Tetrahydrocannabinol: POSITIVE — AB

## 2019-12-12 MED ORDER — ONDANSETRON 4 MG PO TBDP: 4.0000 mg | ORAL_TABLET | Freq: Three times a day (TID) | ORAL | 0 refills | Status: DC | PRN

## 2019-12-12 MED ORDER — SODIUM CHLORIDE 0.9 % IV BOLUS
1000.0000 mL | Freq: Once | INTRAVENOUS | Status: AC
  Administered 2019-12-12: 1000 mL via INTRAVENOUS

## 2019-12-12 MED ORDER — AMLODIPINE BESYLATE 5 MG PO TABS
10.0000 mg | ORAL_TABLET | Freq: Once | ORAL | Status: AC
  Administered 2019-12-12: 10 mg via ORAL
  Filled 2019-12-12: qty 2

## 2019-12-12 MED ORDER — FAMOTIDINE IN NACL 20-0.9 MG/50ML-% IV SOLN
20.0000 mg | Freq: Once | INTRAVENOUS | Status: AC
  Administered 2019-12-12: 20 mg via INTRAVENOUS
  Filled 2019-12-12: qty 50

## 2019-12-12 NOTE — ED Notes (Signed)
Pt resting comfortably at this time. Pts spouse called to check on Pt, but Pt refused stating he just wanted to rest and would call her back.

## 2020-01-08 NOTE — Progress Notes (Deleted)
NEUROLOGY CONSULTATION NOTE  [name] MRN: [id number] DOB: [birth year]  Referring provider: Tula Nakayama, MD Primary care provider: Tula Nakayama, MD  Reason for consult:  Headache; abnormal MRI brain  HISTORY OF PRESENT ILLNESS: [name] is a 49 year old ***-handed man with HTN with history of malignant hypertension, CKD, sleep apnea, hypertensive cardiomyopathy, and migraines who presents for headaches and abnormal brain MRI.  History supplemented by hospital, pain medicine and referring provider's notes.  He reports history of migraines since ***.  They are severe left sided pounding pain, typically lasting all day and occurring on average 20-21 days a month.  They are associated with nausea, vomiting, photophobia, phonophobia ***.  They are not associated with numbness or unilateral weakness.  They are triggered or aggravated by sunlight, *** or bending over.  They are relieved by ***.  His migraines have been refractory to conservative management.  Prior treatment included narcotic medications.  He has been under the care of pain medicine for his chronic migraines.    He has chronic kidney disease with history of malignant hypertension.  He was admitted to the hospital in October 2020 for hypertensive emergency and acute kidney injury with severe headache.  MRI of brain without contrast personally reviewed showed mild nonspecific cerebral white matter changes likely chronic small vessel disease, but no acute intracranial abnormality.  MRA of head and neck showed 2 mm bulbous area in the interior communicating artery region.  Current NSAIDS:  *** Current analgesics:  *** Current triptans:  *** Current ergotamine:  *** Current anti-emetic:  Zofran ODT 4mg ; promethazine 12.5mg  Current muscle relaxants:  *** Current anti-anxiolytic:  hydroxyzine Current sleep aide:  *** Current Antihypertensive medications:  Metoprolol; amlodipine; clonidine Current Antidepressant  medications:  *** Current Anticonvulsant medications:  *** Current anti-CGRP:  *** Current Vitamins/Herbal/Supplements:  *** Current Antihistamines/Decongestants:  *** Other therapy:  *** Hormone/birth control:  *** Other medications:  ***  Past NSAIDS:  naproxen Past analgesics:  Fioricet; hydrocodone-acetaminophen; oxycodone; tramadol Past abortive triptans:  Sumatriptan; Maxalt Past abortive ergotamine:  *** Past muscle relaxants:  *** Past anti-emetic:  *** Past antihypertensive medications:  Labetolol; carvedilol; furosemide; Maxzide Past antidepressant medications:  Cymbalta Past anticonvulsant medications:  Topiramate; gabapentin; Lyrica Past anti-CGRP:  *** Past vitamins/Herbal/Supplements:  *** Past antihistamines/decongestants:  *** Other past therapies:  ***  Caffeine:  *** Alcohol:  *** Smoker:  *** Diet:  *** Exercise:  *** Depression:  ***; Anxiety:  *** Other pain:  *** Sleep hygiene:  *** Family history of headache:  ***   PAST MEDICAL HISTORY: Past Medical History:  Diagnosis Date  . Acute kidney injury ([place])   . Bilateral renal cysts 02/23/2014   Noted in 2013 initially   . Chronic back pain   . Depression, major, single episode, severe ([place]) 08/13/2018   pHQ 9 score of 24, not suicidal or homicidal, score of 8 in 09/2018, no medication or therapy as of this time  . Duodenitis with bleeding 08/2008   Ulcer - admitted to [facility]   . Essential hypertension, benign   . GERD (gastroesophageal reflux disease)   . Headache(784.0)   . Hyperlipidemia   . Hypertensive urgency 10/08/2019  . Migraines   . Nonspecific abnormal electrocardiogram (ECG) (EKG) 08/30/2016  . Obesity, unspecified   . Sleep apnea   . Stress and adjustment reaction 11/04/2016    PAST SURGICAL HISTORY: Past Surgical History:  Procedure Laterality Date  . 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   Procedure: Revision and  pinning of 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.;   Surgeon: Jolyn Nap, MD;  Location: [place];  Service: Orthopedics;  Laterality: Left;  . CHOLECYSTECTOMY N/A 10/03/2018   Procedure: LAPAROSCOPIC CHOLECYSTECTOMY;  Surgeon: Aviva Signs, MD;  Location: [facility];  Service: General;  Laterality: N/A;  . FINGER DEBRIDEMENT Left 09/25/2013   Thumb  . FOOT FRACTURE SURGERY Right     MEDICATIONS: Current Outpatient Medications on File Prior to Visit  Medication Sig Dispense Refill  . amLODipine (NORVASC) 10 MG tablet Take 1 tablet (10 mg total) by mouth daily. (Patient taking differently: Take 10 mg by mouth in the morning and at bedtime. ) 90 tablet 3  . cloNIDine (CATAPRES - DOSED IN MG/24 HR) 0.3 mg/24hr patch Place 1 patch (0.3 mg total) onto the skin once a week. 12 patch 0  . esomeprazole (NEXIUM) 20 MG capsule Take 1 capsule (20 mg total) by mouth 2 (two) times daily before a meal. 60 capsule 3  . hydrOXYzine (VISTARIL) 50 MG capsule TAKE ONE CAPSULE BY MOUTH TWO TIMES DAILY AS NEEDED, FOR ANXIETY 60 capsule 2  . metoprolol tartrate (LOPRESSOR) 25 MG tablet Take 1 tablet (25 mg total) by mouth 2 (two) times daily. 60 tablet 1  . ondansetron (ZOFRAN ODT) 4 MG disintegrating tablet Take 1 tablet (4 mg total) by mouth every 8 (eight) hours as needed for nausea or vomiting. 20 tablet 0  . promethazine (PHENERGAN) 12.5 MG tablet Take 1 tablet (12.5 mg total) by mouth every 6 (six) hours as needed for nausea or vomiting. 30 tablet 0  . spironolactone (ALDACTONE) 25 MG tablet Take 25 mg by mouth 2 (two) times daily.      No current facility-administered medications on file prior to visit.    ALLERGIES: Allergies  Allergen Reactions  . Bee Venom Anaphylaxis  . Prochlorperazine Edisylate Other (See Comments)    Reaction: Jittery,uncomfortable feeling  . Cyclobenzaprine Hcl Other (See Comments)    REACTION: feel anxious, nausea  . Aleve [Naproxen Sodium] Nausea And Vomiting    States that this medication also causes abdominal pain  .  Carbamazepine     REACTION: unknown reaction  . Geodon [Ziprasidone Hcl] Nausea And Vomiting  . Ibuprofen Nausea And Vomiting    States that this medication also causes abdominal pain  . Maxzide [Hydrochlorothiazide W-Triamterene]     headache  . Nsaids Other (See Comments)    H/o severe anemia from UGIB from ulcer  . Tylenol [Acetaminophen] Nausea And Vomiting    Patient states that this medication also causes abdominal pain  . Other Nausea And Vomiting, Rash and Other (See Comments)    ALL MUSCLE RELAXANTS: states that they affect sciatic nerve, may cause nausea and vomiting, rash    FAMILY HISTORY: Family History  Problem Relation Age of Onset  . Diabetes Mother   . Hypertension Mother   . Hypertension Father   . Arthritis Father   . Diabetes Father   . Colon cancer Neg Hx   . Gastric cancer Neg Hx   . Esophageal cancer Neg Hx    SOCIAL HISTORY: Social History   Socioeconomic History  . Marital status: Married    Spouse name: Not on file  . Number of children: 4  . Years of education: Not on file  . Highest education level: Not on file  Occupational History  . Occupation: Musician     Comment: [employer]  Tobacco Use  . Smoking status: Former  Smoker    Packs/day: 0.10    Years: 15.00    Pack years: 1.50    Types: Cigarettes    Quit date: 09/15/2012    Years since quitting: 7.3  . Smokeless tobacco: Never Used  Substance and Sexual Activity  . Alcohol use: No  . Drug use: No  . Sexual activity: Yes  Other Topics Concern  . Not on file  Social History Narrative  . Not on file   Social Determinants of Health   Financial Resource Strain: Low Risk   . Difficulty of Paying Living Expenses: Not very hard  Food Insecurity: No Food Insecurity  . Worried About Charity fundraiser in the Last Year: Never true  . Ran Out of Food in the Last Year: Never true  Transportation Needs: No Transportation Needs  . Lack of Transportation (Medical): No    . Lack of Transportation (Non-Medical): No  Physical Activity: Unknown  . Days of Exercise per Week: Patient refused  . Minutes of Exercise per Session: Patient refused  Stress:   . Feeling of Stress :   Social Connections: Unknown  . Frequency of Communication with Friends and Family: Patient refused  . Frequency of Social Gatherings with Friends and Family: Patient refused  . Attends Religious Services: Patient refused  . Active Member of Clubs or Organizations: Patient refused  . Attends Archivist Meetings: Patient refused  . Marital Status: Patient refused  Intimate Partner Violence: Not At Risk  . Fear of Current or Ex-Partner: No  . Emotionally Abused: No  . Physically Abused: No  . Sexually Abused: No    REVIEW OF SYSTEMS: Constitutional: No fevers, chills, or sweats, no generalized fatigue, change in appetite Eyes: No visual changes, double vision, eye pain Ear, nose and throat: No hearing loss, ear pain, nasal congestion, sore throat Cardiovascular: No chest pain, palpitations Respiratory:  No shortness of breath at rest or with exertion, wheezes GastrointestinaI: No nausea, vomiting, diarrhea, abdominal pain, fecal incontinence Genitourinary:  No dysuria, urinary retention or frequency Musculoskeletal:  No neck pain, back pain Integumentary: No rash, pruritus, skin lesions Neurological: as above Psychiatric: No depression, insomnia, anxiety Endocrine: No palpitations, fatigue, diaphoresis, mood swings, change in appetite, change in weight, increased thirst Hematologic/Lymphatic:  No purpura, petechiae. Allergic/Immunologic: no itchy/runny eyes, nasal congestion, recent allergic reactions, rashes  PHYSICAL EXAM: *** General: No acute distress.  Patient appears well-groomed.  Head:  Normocephalic/atraumatic Eyes:  fundi examined but not visualized Neck: supple, no paraspinal tenderness, full range of motion Back: No paraspinal tenderness Heart: regular  rate and rhythm Lungs: Clear to auscultation bilaterally. Vascular: No carotid bruits. Neurological Exam: Mental status: alert and oriented to person, place, and time, recent and remote memory intact, fund of knowledge intact, attention and concentration intact, speech fluent and not dysarthric, language intact. Cranial nerves: CN I: not tested CN II: pupils equal, round and reactive to light, visual fields intact CN III, IV, VI:  full range of motion, no nystagmus, no ptosis CN V: facial sensation intact CN VII: upper and lower face symmetric CN VIII: hearing intact CN IX, X: gag intact, uvula midline CN XI: sternocleidomastoid and trapezius muscles intact CN XII: tongue midline Bulk & Tone: normal, no fasciculations. Motor:  5/5 throughout *** Sensation:  Pinp[place] *** temperature *** and vibration sensation intact.  ***. Deep Tendon Reflexes:  2+ throughout, *** toes downgoing.  *** Finger to nose testing:  Without dysmetria.  *** Heel to shin:  Without dysmetria.  ***  Gait:  Normal station and stride.  Able to turn and tandem walk. [place] ***.  IMPRESSION: 1.  Chronic migraine without aura, without status migrainosus, intractable 2.  Abnormal MRA head finding:  2 mm bulge in anterior communicating artery region.  ***  PLAN: ***  Thank you for allowing me to take part in the care of this patient.  Metta Clines, DO  CC: ***

## 2020-01-09 ENCOUNTER — Ambulatory Visit: Payer: BC Managed Care – PPO | Admitting: Neurology

## 2020-01-20 ENCOUNTER — Telehealth: Payer: Self-pay | Admitting: *Deleted

## 2020-01-20 NOTE — Telephone Encounter (Signed)
Per [employer] website:     The selected member does not require an Order ID from [employer] for surgical services. Please contact the health plan using the number on the back of the member's ID card to determine if an Order ID number is neede

## 2020-01-23 DIAGNOSIS — I129 Hypertensive chronic kidney disease with stage 1 through stage 4 chronic kidney disease, or unspecified chronic kidney disease: Secondary | ICD-10-CM | POA: Diagnosis not present

## 2020-01-23 DIAGNOSIS — N184 Chronic kidney disease, stage 4 (severe): Secondary | ICD-10-CM | POA: Diagnosis not present

## 2020-01-23 DIAGNOSIS — R112 Nausea with vomiting, unspecified: Secondary | ICD-10-CM | POA: Diagnosis not present

## 2020-02-04 NOTE — Patient Instructions (Signed)
[name]  02/04/2020     @PREFPERIOPPHARMACY @   Your procedure is scheduled on  02/10/2020 .  Report to [facility] at  1130  A.M.  Call this number if you have problems the morning of surgery:  [phone]   Remember:  Follow the diet and prep instructions given to you by Dr Roseanne Kaufman office.                    Take these medicines the morning of surgery with A SIP OF WATER  Amlodipine, nexium, vistaril(if needed), metoprolol. Zofran or phenergan (if needed).    Do not wear jewelry, make-up or nail polish.  Do not wear lotions, powders, or perfumes. Please wear deodorant and brush your teeth.  Do not shave 48 hours prior to surgery.  Men may shave face and neck.  Do not bring valuables to the hospital.  [facility] is not responsible for any belongings or valuables.  Contacts, dentures or bridgework may not be worn into surgery.  Leave your suitcase in the car.  After surgery it may be brought to your room.  For patients admitted to the hospital, discharge time will be determined by your treatment team.  Patients discharged the day of surgery will not be allowed to drive home.   Name and phone number of your driver:   family Special instructions:  DO NOT smoke the morning of your procedure.  Please read over the following fact sheets that you were given. Anesthesia Post-op Instructions and Care and Recovery After Surgery       Upper Endoscopy, Adult, Care After This sheet gives you information about how to care for yourself after your procedure. Your health care provider may also give you more specific instructions. If you have problems or questions, contact your health care provider. What can I expect after the procedure? After the procedure, it is common to have:  A sore throat.  Mild stomach pain or discomfort.  Bloating.  Nausea. Follow these instructions at home:   Follow instructions from your health care provider about what to eat or  drink after your procedure.  Return to your normal activities as told by your health care provider. Ask your health care provider what activities are safe for you.  Take over-the-counter and prescription medicines only as told by your health care provider.  Do not drive for 24 hours if you were given a sedative during your procedure.  Keep all follow-up visits as told by your health care provider. This is important. Contact a health care provider if you have:  A sore throat that lasts longer than one day.  Trouble swallowing. Get help right away if:  You vomit blood or your vomit looks like coffee grounds.  You have: ? A fever. ? Bloody, black, or tarry stools. ? A severe sore throat or you cannot swallow. ? Difficulty breathing. ? Severe pain in your chest or abdomen. Summary  After the procedure, it is common to have a sore throat, mild stomach discomfort, bloating, and nausea.  Do not drive for 24 hours if you were given a sedative during the procedure.  Follow instructions from your health care provider about what to eat or drink after your procedure.  Return to your normal activities as told by your health care provider. This information is not intended to replace advice given to you by your health care provider. Make sure you discuss any  questions you have with your health care provider. Document Revised: 02/27/2018 Document Reviewed: 02/05/2018 Elsevier Patient Education  [place].  Esophageal Dilatation Esophageal dilatation, also called esophageal dilation, is a procedure to widen or open (dilate) a blocked or narrowed part of the esophagus. The esophagus is the part of the body that moves food and liquid from the mouth to the stomach. You may need this procedure if:  You have a buildup of scar tissue in your esophagus that makes it difficult, painful, or impossible to swallow. This can be caused by gastroesophageal reflux disease (GERD).  You have cancer  of the esophagus.  There is a problem with how food moves through your esophagus. In some cases, you may need this procedure repeated at a later time to dilate the esophagus gradually. Tell a health care provider about:  Any allergies you have.  All medicines you are taking, including vitamins, herbs, eye drops, creams, and over-the-counter medicines.  Any problems you or family members have had with anesthetic medicines.  Any blood disorders you have.  Any surgeries you have had.  Any medical conditions you have.  Any antibiotic medicines you are required to take before dental procedures.  Whether you are pregnant or may be pregnant. What are the risks? Generally, this is a safe procedure. However, problems may occur, including:  Bleeding due to a tear in the lining of the esophagus.  A hole (perforation) in the esophagus. What happens before the procedure?  Follow instructions from your health care provider about eating or drinking restrictions.  Ask your health care provider about changing or stopping your regular medicines. This is especially important if you are taking diabetes medicines or blood thinners.  Plan to have someone take you home from the hospital or clinic.  Plan to have a responsible adult care for you for at least 24 hours after you leave the hospital or clinic. This is important. What happens during the procedure?  You may be given a medicine to help you relax (sedative).  A numbing medicine may be sprayed into the back of your throat, or you may gargle the medicine.  Your health care provider may perform the dilatation using various surgical instruments, such as: ? Simple dilators. This instrument is carefully placed in the esophagus to stretch it. ? Guided wire bougies. This involves using an endoscope to insert a wire into the esophagus. A dilator is passed over this wire to enlarge the esophagus. Then the wire is removed. ? Balloon dilators. An  endoscope with a small balloon at the end is inserted into the esophagus. The balloon is inflated to stretch the esophagus and open it up. The procedure may vary among health care providers and hospitals. What happens after the procedure?  Your blood pressure, heart rate, breathing rate, and blood oxygen level will be monitored until the medicines you were given have worn off.  Your throat may feel slightly sore and numb. This will improve slowly over time.  You will not be allowed to eat or drink until your throat is no longer numb.  When you are able to drink, urinate, and sit on the edge of the bed without nausea or dizziness, you may be able to return home. Follow these instructions at home:  Take over-the-counter and prescription medicines only as told by your health care provider.  Do not drive for 24 hours if you were given a sedative during your procedure.  You should have a responsible adult with you  for 24 hours after the procedure.  Follow instructions from your health care provider about any eating or drinking restrictions.  Do not use any products that contain nicotine or tobacco, such as cigarettes and e-cigarettes. If you need help quitting, ask your health care provider.  Keep all follow-up visits as told by your health care provider. This is important. Get help right away if you:  Have a fever.  Have chest pain.  Have pain that is not relieved by medication.  Have trouble breathing.  Have trouble swallowing.  Vomit blood. Summary  Esophageal dilatation, also called esophageal dilation, is a procedure to widen or open (dilate) a blocked or narrowed part of the esophagus.  Plan to have someone take you home from the hospital or clinic.  For this procedure, a numbing medicine may be sprayed into the back of your throat, or you may gargle the medicine.  Do not drive for 24 hours if you were given a sedative during your procedure. This information is not  intended to replace advice given to you by your health care provider. Make sure you discuss any questions you have with your health care provider. Document Revised: 07/03/2019 Document Reviewed: 07/11/2017 Elsevier Patient Education  [place].  Colonoscopy, Adult, Care After This sheet gives you information about how to care for yourself after your procedure. Your health care provider may also give you more specific instructions. If you have problems or questions, contact your health care provider. What can I expect after the procedure? After the procedure, it is common to have:  A small amount of blood in your stool for 24 hours after the procedure.  Some gas.  Mild cramping or bloating of your abdomen. Follow these instructions at home: Eating and drinking   Drink enough fluid to keep your urine pale yellow.  Follow instructions from your health care provider about eating or drinking restrictions.  Resume your normal diet as instructed by your health care provider. Avoid heavy or fried foods that are hard to digest. Activity  Rest as told by your health care provider.  Avoid sitting for a long time without moving. Get up to take short walks every 1-2 hours. This is important to improve blood flow and breathing. Ask for help if you feel weak or unsteady.  Return to your normal activities as told by your health care provider. Ask your health care provider what activities are safe for you. Managing cramping and bloating   Try walking around when you have cramps or feel bloated.  Apply heat to your abdomen as told by your health care provider. Use the heat source that your health care provider recommends, such as a moist heat pack or a heating pad. ? Place a towel between your skin and the heat source. ? Leave the heat on for 20-30 minutes. ? Remove the heat if your skin turns bright red. This is especially important if you are unable to feel pain, heat, or cold. You may  have a greater risk of getting burned. General instructions  For the first 24 hours after the procedure: ? Do not drive or use machinery. ? Do not sign important documents. ? Do not drink alcohol. ? Do your regular daily activities at a slower pace than normal. ? Eat soft foods that are easy to digest.  Take over-the-counter and prescription medicines only as told by your health care provider.  Keep all follow-up visits as told by your health care provider. This is important.  Contact a health care provider if:  You have blood in your stool 2-3 days after the procedure. Get help right away if you have:  More than a small spotting of blood in your stool.  Large blood clots in your stool.  Swelling of your abdomen.  Nausea or vomiting.  A fever.  Increasing pain in your abdomen that is not relieved with medicine. Summary  After the procedure, it is common to have a small amount of blood in your stool. You may also have mild cramping and bloating of your abdomen.  For the first 24 hours after the procedure, do not drive or use machinery, sign important documents, or drink alcohol.  Get help right away if you have a lot of blood in your stool, nausea or vomiting, a fever, or increased pain in your abdomen. This information is not intended to replace advice given to you by your health care provider. Make sure you discuss any questions you have with your health care provider. Document Revised: 04/01/2019 Document Reviewed: 04/01/2019 Elsevier Patient Education  [place] After These instructions provide you with information about caring for yourself after your procedure. Your health care provider may also give you more specific instructions. Your treatment has been planned according to current medical practices, but problems sometimes occur. Call your health care provider if you have any problems or questions after your procedure. What can I  expect after the procedure? After your procedure, you may:  Feel sleepy for several hours.  Feel clumsy and have poor balance for several hours.  Feel forgetful about what happened after the procedure.  Have poor judgment for several hours.  Feel nauseous or vomit.  Have a sore throat if you had a breathing tube during the procedure. Follow these instructions at home: For at least 24 hours after the procedure:      Have a responsible adult stay with you. It is important to have someone help care for you until you are awake and alert.  Rest as needed.  Do not: ? Participate in activities in which you could fall or become injured. ? Drive. ? Use heavy machinery. ? Drink alcohol. ? Take sleeping pills or medicines that cause drowsiness. ? Make important decisions or sign legal documents. ? Take care of children on your own. Eating and drinking  Follow the diet that is recommended by your health care provider.  If you vomit, drink water, juice, or soup when you can drink without vomiting.  Make sure you have little or no nausea before eating solid foods. General instructions  Take over-the-counter and prescription medicines only as told by your health care provider.  If you have sleep apnea, surgery and certain medicines can increase your risk for breathing problems. Follow instructions from your health care provider about wearing your sleep device: ? Anytime you are sleeping, including during daytime naps. ? While taking prescription pain medicines, sleeping medicines, or medicines that make you drowsy.  If you smoke, do not smoke without supervision.  Keep all follow-up visits as told by your health care provider. This is important. Contact a health care provider if:  You keep feeling nauseous or you keep vomiting.  You feel light-headed.  You develop a rash.  You have a fever. Get help right away if:  You have trouble breathing. Summary  For several  hours after your procedure, you may feel sleepy and have poor judgment.  Have a responsible adult stay with you  for at least 24 hours or until you are awake and alert. This information is not intended to replace advice given to you by your health care provider. Make sure you discuss any questions you have with your health care provider. Document Revised: 12/04/2017 Document Reviewed: 12/27/2015 Elsevier Patient Education  [place].

## 2020-02-06 ENCOUNTER — Telehealth: Payer: Self-pay | Admitting: *Deleted

## 2020-02-06 ENCOUNTER — Encounter (HOSPITAL_COMMUNITY)
Admission: RE | Admit: 2020-02-06 | Discharge: 2020-02-06 | Disposition: A | Discharge status: Home / Self Care | Payer: BC Managed Care – PPO | Source: Ambulatory Visit | Attending: Internal Medicine | Admitting: Internal Medicine

## 2020-02-06 ENCOUNTER — Other Ambulatory Visit (HOSPITAL_COMMUNITY): Payer: BC Managed Care – PPO

## 2020-02-06 ENCOUNTER — Encounter (HOSPITAL_COMMUNITY): Payer: Self-pay

## 2020-02-06 ENCOUNTER — Other Ambulatory Visit (HOSPITAL_COMMUNITY)
Admission: RE | Admit: 2020-02-06 | Discharge: 2020-02-06 | Disposition: A | Discharge status: Home / Self Care | Payer: BC Managed Care – PPO | Source: Ambulatory Visit | Attending: Internal Medicine | Admitting: Internal Medicine

## 2020-02-06 NOTE — Progress Notes (Addendum)
Called patient's home, his wife answered. [name]'s appointment was at 2:15. He was asleep when I called. His wife said he hasn't felt good today. I asked her to call his Dr. To reschedule appointments. Wife voiced understanding, nothing further needed.  PS Called [employer] went to voice mail.

## 2020-02-06 NOTE — Telephone Encounter (Signed)
Received message from [facility] in endo patient was NS for pre-op/covid test. Procedure scheduled for Monday.  LMOVM for pt

## 2020-02-07 NOTE — Telephone Encounter (Signed)
FYI to EG 

## 2020-02-07 NOTE — Telephone Encounter (Signed)
LMOVM for endo scheduler to cancel procedure 

## 2020-02-07 NOTE — Telephone Encounter (Signed)
Noted  

## 2020-02-10 ENCOUNTER — Ambulatory Visit (HOSPITAL_COMMUNITY)
Admission: RE | Admit: 2020-02-10 | Payer: BC Managed Care – PPO | Source: Home / Self Care | Admitting: Internal Medicine

## 2020-02-10 ENCOUNTER — Encounter (HOSPITAL_COMMUNITY): Admission: RE | Payer: Self-pay | Source: Home / Self Care

## 2020-02-10 SURGERY — COLONOSCOPY WITH PROPOFOL
Anesthesia: Monitor Anesthesia Care

## 2020-02-15 IMAGING — DX PORTABLE CHEST - 1 VIEW
1 series · 1 of 1 positions shown · non-contrast
Comparison: Portable exam 6313 hours compared to [DATE]

CLINICAL DATA: Vomiting since he got up this morning, hypertension,
GERD

EXAM:
PORTABLE CHEST 1 VIEW

[chest ap]
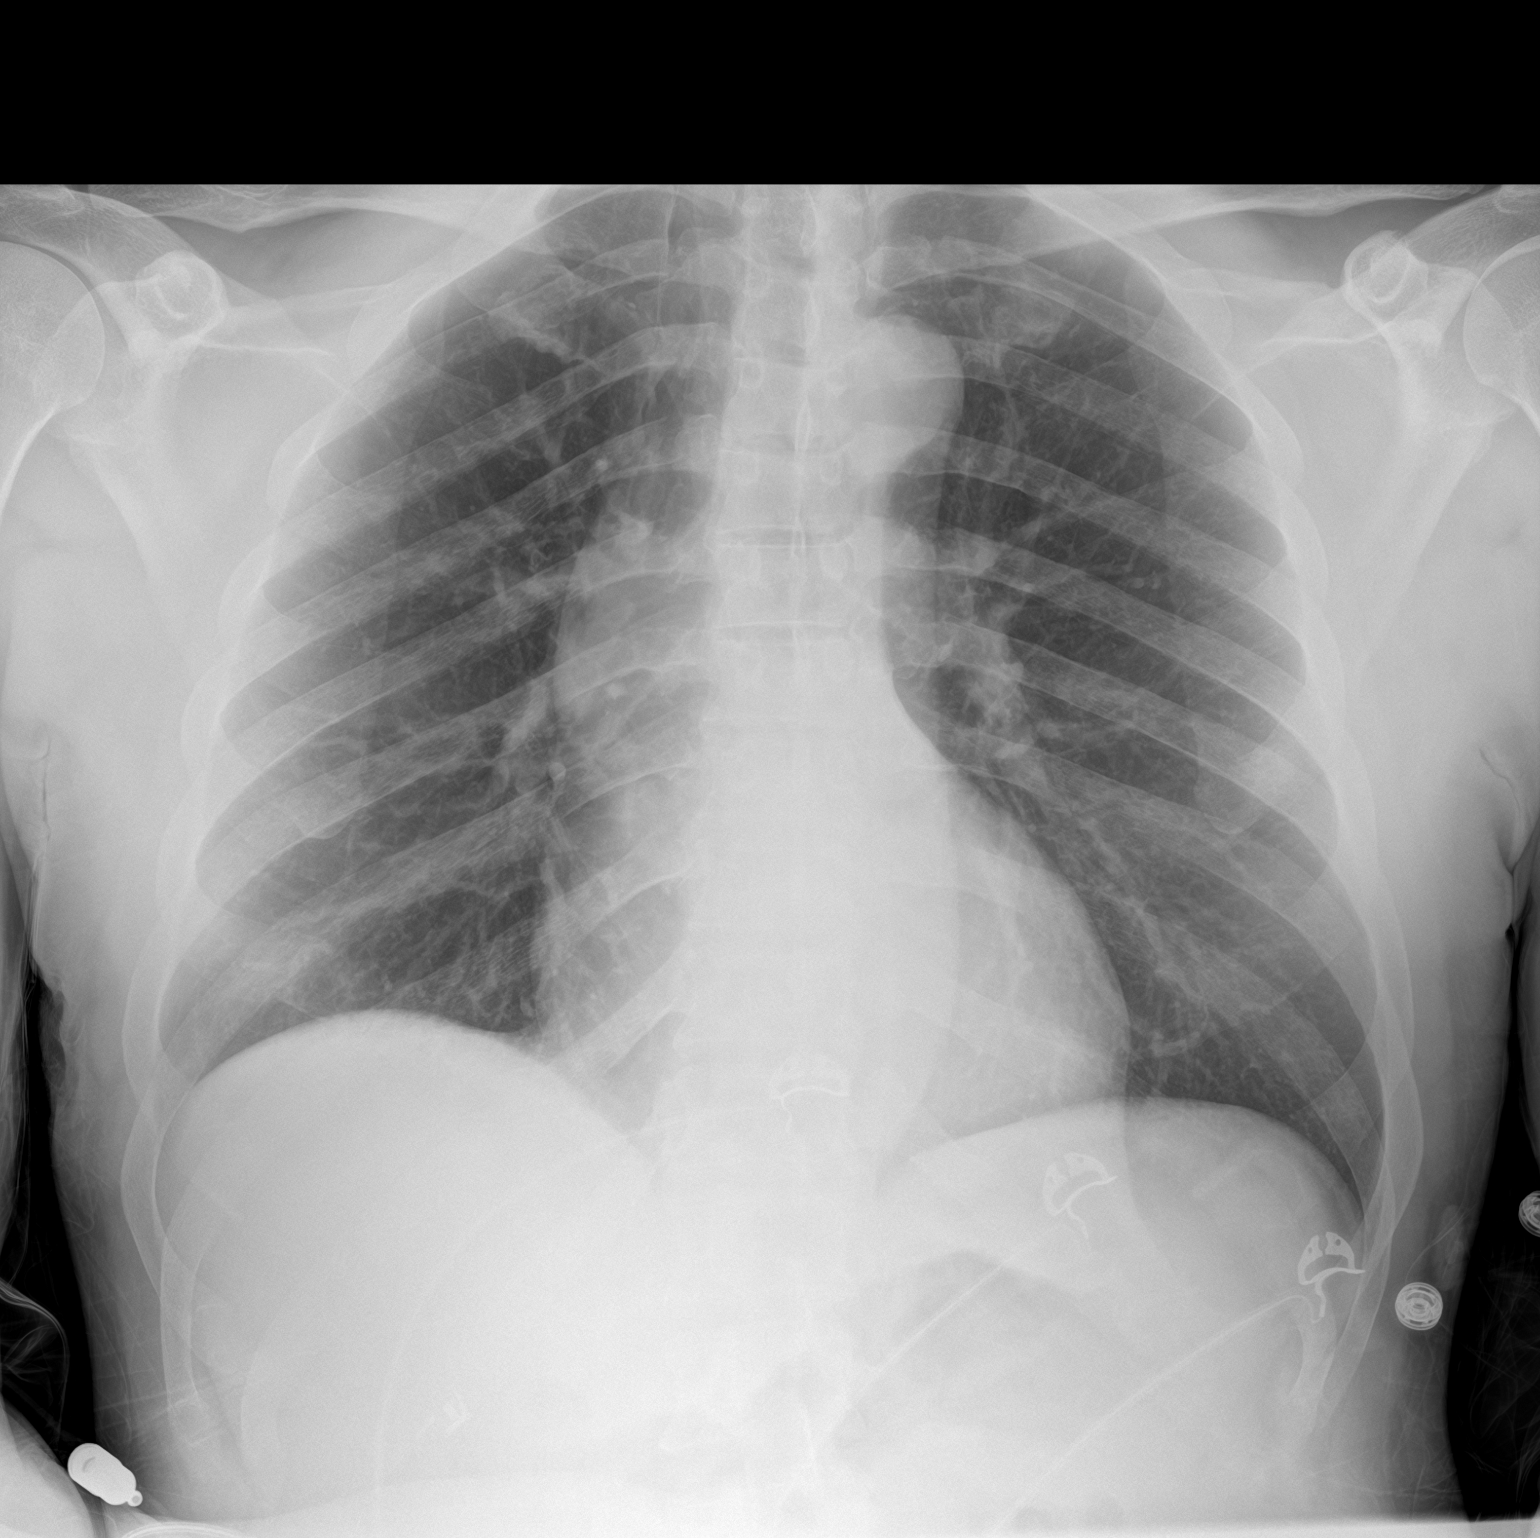

[1 of 1 positions shown; findings below may reference images not displayed]

FINDINGS: Normal heart size, mediastinal contours, and pulmonary vascularity.

Lungs clear.

No pleural effusion or pneumothorax.

Questionable nodular density LEFT mid lung versus summation artifact
from ribs and scapula.

Bones unremarkable.
IMPRESSION: No acute infiltrate.

Questionable nodular density versus summation artifact LEFT mid
lung; follow-up upright PA and lateral chest radiographs recommended
to exclude pulmonary nodule.

## 2020-03-04 ENCOUNTER — Other Ambulatory Visit: Payer: Self-pay

## 2020-03-04 MED ORDER — CLONIDINE 0.3 MG/24HR TD PTWK: 0.3000 mg | MEDICATED_PATCH | TRANSDERMAL | 0 refills | Status: DC

## 2020-03-06 ENCOUNTER — Other Ambulatory Visit: Payer: Self-pay | Admitting: Family Medicine

## 2020-03-09 ENCOUNTER — Ambulatory Visit: Payer: BC Managed Care – PPO | Admitting: Family Medicine

## 2020-03-18 ENCOUNTER — Other Ambulatory Visit: Payer: Self-pay

## 2020-03-18 ENCOUNTER — Emergency Department (HOSPITAL_COMMUNITY)
Admission: EM | Admit: 2020-03-18 | Discharge: 2020-03-18 | Disposition: A | Discharge status: Home / Self Care | Payer: BC Managed Care – PPO | Attending: Emergency Medicine | Admitting: Emergency Medicine

## 2020-03-18 ENCOUNTER — Emergency Department (HOSPITAL_COMMUNITY): Payer: BC Managed Care – PPO

## 2020-03-18 ENCOUNTER — Ambulatory Visit: Payer: BC Managed Care – PPO | Admitting: Nurse Practitioner

## 2020-03-18 DIAGNOSIS — R42 Dizziness and giddiness: Secondary | ICD-10-CM | POA: Insufficient documentation

## 2020-03-18 DIAGNOSIS — R110 Nausea: Secondary | ICD-10-CM | POA: Diagnosis not present

## 2020-03-18 DIAGNOSIS — I129 Hypertensive chronic kidney disease with stage 1 through stage 4 chronic kidney disease, or unspecified chronic kidney disease: Secondary | ICD-10-CM | POA: Diagnosis not present

## 2020-03-18 DIAGNOSIS — H53149 Visual discomfort, unspecified: Secondary | ICD-10-CM | POA: Diagnosis not present

## 2020-03-18 DIAGNOSIS — R519 Headache, unspecified: Secondary | ICD-10-CM | POA: Diagnosis not present

## 2020-03-18 DIAGNOSIS — Z87891 Personal history of nicotine dependence: Secondary | ICD-10-CM | POA: Insufficient documentation

## 2020-03-18 DIAGNOSIS — I160 Hypertensive urgency: Secondary | ICD-10-CM | POA: Diagnosis not present

## 2020-03-18 DIAGNOSIS — Z79899 Other long term (current) drug therapy: Secondary | ICD-10-CM | POA: Diagnosis not present

## 2020-03-18 DIAGNOSIS — N184 Chronic kidney disease, stage 4 (severe): Secondary | ICD-10-CM | POA: Insufficient documentation

## 2020-03-18 LAB — BASIC METABOLIC PANEL
Anion gap: 10 (ref 5–15)
BUN: 37 mg/dL — ABNORMAL HIGH (ref 6–20)
CO2: 24 mmol/L (ref 22–32)
Calcium: 8.7 mg/dL — ABNORMAL LOW (ref 8.9–10.3)
Chloride: 106 mmol/L (ref 98–111)
Creatinine, Ser: 3.65 mg/dL — ABNORMAL HIGH (ref 0.61–1.24)
GFR calc Af Amer: 21 mL/min — ABNORMAL LOW (ref >60)
GFR calc non Af Amer: 18 mL/min — ABNORMAL LOW (ref >60)
Glucose, Bld: 104 mg/dL — ABNORMAL HIGH (ref 70–99)
Potassium: 3.5 mmol/L (ref 3.5–5.1)
Sodium: 140 mmol/L (ref 135–145)

## 2020-03-18 LAB — CBC WITH DIFFERENTIAL/PLATELET
Abs Immature Granulocytes: 0.01 10*3/uL (ref 0.00–0.07)
Basophils Absolute: 0 10*3/uL (ref 0.0–0.1)
Basophils Relative: 0 %
Eosinophils Absolute: 0.1 10*3/uL (ref 0.0–0.5)
Eosinophils Relative: 1 %
HCT: 30.2 % — ABNORMAL LOW (ref 39.0–52.0)
Hemoglobin: 10.1 g/dL — ABNORMAL LOW (ref 13.0–17.0)
Immature Granulocytes: 0 %
Lymphocytes Relative: 16 %
Lymphs Abs: 0.8 10*3/uL (ref 0.7–4.0)
MCH: 32.1 pg (ref 26.0–34.0)
MCHC: 33.4 g/dL (ref 30.0–36.0)
MCV: 95.9 fL (ref 80.0–100.0)
Monocytes Absolute: 0.3 10*3/uL (ref 0.1–1.0)
Monocytes Relative: 6 %
Neutro Abs: 3.7 10*3/uL (ref 1.7–7.7)
Neutrophils Relative %: 77 %
Platelets: 226 10*3/uL (ref 150–400)
RBC: 3.15 MIL/uL — ABNORMAL LOW (ref 4.22–5.81)
RDW: 11.4 % — ABNORMAL LOW (ref 11.5–15.5)
WBC: 4.9 10*3/uL (ref 4.0–10.5)
nRBC: 0 % (ref 0.0–0.2)

## 2020-03-18 MED ORDER — DIPHENHYDRAMINE HCL 50 MG/ML IJ SOLN
25.0000 mg | Freq: Once | INTRAMUSCULAR | Status: AC
  Administered 2020-03-18: 25 mg via INTRAVENOUS
  Filled 2020-03-18: qty 1

## 2020-03-18 MED ORDER — AMLODIPINE BESYLATE 5 MG PO TABS
10.0000 mg | ORAL_TABLET | Freq: Once | ORAL | Status: AC
  Administered 2020-03-18: 10 mg via ORAL
  Filled 2020-03-18: qty 2

## 2020-03-18 MED ORDER — SPIRONOLACTONE 25 MG PO TABS
25.0000 mg | ORAL_TABLET | Freq: Once | ORAL | Status: AC
  Administered 2020-03-18: 25 mg via ORAL
  Filled 2020-03-18 (×2): qty 1

## 2020-03-18 MED ORDER — SODIUM CHLORIDE 0.9 % IV BOLUS
500.0000 mL | Freq: Once | INTRAVENOUS | Status: AC
  Administered 2020-03-18: 500 mL via INTRAVENOUS

## 2020-03-18 MED ORDER — METOCLOPRAMIDE HCL 5 MG/ML IJ SOLN
10.0000 mg | Freq: Once | INTRAMUSCULAR | Status: AC
  Administered 2020-03-18: 10 mg via INTRAVENOUS
  Filled 2020-03-18: qty 2

## 2020-03-18 MED ORDER — LABETALOL HCL 5 MG/ML IV SOLN
20.0000 mg | Freq: Once | INTRAVENOUS | Status: AC
  Administered 2020-03-18: 20 mg via INTRAVENOUS
  Filled 2020-03-18: qty 4

## 2020-03-18 MED ORDER — ONDANSETRON HCL 4 MG/2ML IJ SOLN
4.0000 mg | Freq: Once | INTRAMUSCULAR | Status: AC
  Administered 2020-03-18: 4 mg via INTRAVENOUS
  Filled 2020-03-18: qty 2

## 2020-03-18 NOTE — Discharge Instructions (Signed)
Take your pressure medications as prescribed.  Follow-up with your doctor.  You should also see the kidney doctor regarding your worsening kidney function.  It is important that you keep your blood pressure under good control to avoid damage to your brain, eyes, kidneys, heart.  Return to the ED with sudden onset headache, chest pain, shortness of breath, unilateral weakness, numbness, tingling, or any other concerns.

## 2020-03-18 NOTE — Progress Notes (Deleted)
Referring Provider: Fayrene Helper, MD Primary Care Physician:  Fayrene Helper, MD Primary GI:  Dr. Gala Romney  No chief complaint on file.   HPI:   [name] is a 49 y.o. male who presents for follow-up on GERD, nausea/vomiting.  The patient was last seen in our office 11/27/2019 for GERD, unintentional weight loss, dysphagia, nausea/vomiting.  Noted history of GERD and advanced chronic kidney disease with creatinine close to 3, GFR 26.  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  EGD was completed which found healed duodenal ulcer, edematous lumen.  Recommended consider future surgical need if symptoms persist.  At his last visit he noted he had had nausea and "crazy abdominal symptoms" since before his cholecystectomy and thought that his gallbladder removal would help.  His symptoms have worsened since surgery.  Has nausea 2-3 times a week typically in the morning, some vomiting with last episode about a week prior.  GERD doing "pretty good" if he takes his medication.  Has a ball-like knot in his epigastrium which is painful and not associated with his nausea.  Notes delayed transit/solid food dysphagia without regurgitation.  Bowel movements good.  Subjective 25 pound weight loss over the past 6 months.  No other overt GI complaints.  Denies NSAIDs, ASA powders.  Zofran helps nausea somewhat but given the headache.  Has never had colonoscopy before.  Recommended Nexium twice daily, Phenergan 12.5 mg tablet prescription every 6 hours as needed, colonoscopy and EGD, follow-up in 3 months.  Colonoscopy and endoscopy were scheduled for 02/10/2020, but the patient was no-show for his Covid test and the procedure was subsequently  canceled.  Today he states   Past Medical History:  Diagnosis Date  . Acute kidney injury ([place])   . Bilateral renal cysts 02/23/2014   Noted in 2013 initially   . Chronic back pain   . Depression, major, single episode, severe ([place]) 08/13/2018   pHQ 9 score of 24, not suicidal or homicidal, score of 8 in 09/2018, no medication or therapy as of this time  . Duodenitis with bleeding 08/2008   Ulcer - [facility] to [facility]   . Essential hypertension, benign   . GERD (gastroesophageal reflux disease)   . Headache(784.0)   . Hyperlipidemia   . Hypertensive urgency 10/08/2019  . Migraines   . Nonspecific abnormal electrocardiogram (ECG) (EKG) 08/30/2016  . Obesity, unspecified   . Sleep apnea   . Stress and adjustment reaction 11/04/2016    Past Surgical History:  Procedure Laterality Date  . 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   Procedure: 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.;  Surgeon: Jolyn Nap, MD;  Location: [facility];  Service: Orthopedics;  Laterality: Left;  . CHOLECYSTECTOMY N/A 10/03/2018   Procedure: LAPAROSCOPIC CHOLECYSTECTOMY;  Surgeon: Aviva Signs, MD;  Location: [facility];  Service: General;  Laterality: N/A;  . FINGER DEBRIDEMENT Left 09/25/2013   Thumb  . FOOT FRACTURE SURGERY Right     Current Outpatient Medications  Medication Sig Dispense Refill  . amLODipine (NORVASC) 10 MG tablet Take 1 tablet (10 mg total) by mouth daily. (Patient taking differently: Take 10 mg by mouth in the morning and at bedtime. ) 90 tablet 3  . CLENPIQ 10-3.5-12 MG-GM -GM/160ML  SOLN Take 320 mLs by mouth once.    . cloNIDine (CATAPRES - DOSED IN MG/24 HR) 0.3 mg/24hr patch Place 1 patch (0.3 mg total) onto the skin once a week. 12 patch 0  . esomeprazole (NEXIUM) 20 MG capsule Take 1 capsule (20 mg total) by mouth 2 (two) times daily before a meal. (Patient taking differently: Take 20 mg by mouth daily before breakfast. ) 60 capsule 3  . hydrOXYzine  (VISTARIL) 50 MG capsule TAKE ONE CAPSULE BY MOUTH TWO TIMES DAILY AS NEEDED, FOR ANXIETY (Patient taking differently: Take 50 mg by mouth in the morning and at bedtime. ) 60 capsule 2  . metoprolol tartrate (LOPRESSOR) 25 MG tablet Take 1 tablet (25 mg total) by mouth 2 (two) times daily. 60 tablet 1  . ondansetron (ZOFRAN ODT) 4 MG disintegrating tablet Take 1 tablet (4 mg total) by mouth every 8 (eight) hours as needed for nausea or vomiting. (Patient not taking: Reported on 01/23/2020) 20 tablet 0  . ondansetron (ZOFRAN-ODT) 8 MG disintegrating tablet Take 8 mg by mouth 3 (three) times daily as needed for nausea/vomiting.    . promethazine (PHENERGAN) 12.5 MG tablet Take 1 tablet (12.5 mg total) by mouth every 6 (six) hours as needed for nausea or vomiting. 30 tablet 0  . spironolactone (ALDACTONE) 25 MG tablet Take 25 mg by mouth 2 (two) times daily.      No current facility-administered medications for this visit.    Allergies as of 03/18/2020 - Review Complete 03/18/2020  Allergen Reaction Noted  . Bee venom Anaphylaxis 07/04/2011  . Prochlorperazine edisylate Other (See Comments) 01/07/2009  . Cyclobenzaprine hcl Other (See Comments) 01/07/2009  . Aleve [naproxen sodium] Nausea And Vomiting 07/10/2012  . Carbamazepine    . Geodon [ziprasidone hcl] Nausea And Vomiting 08/31/2015  . Ibuprofen Nausea And Vomiting 07/10/2012  . Maxzide [hydrochlorothiazide w-triamterene]  02/19/2014  . Nsaids Other (See Comments) 06/21/2014  . Tylenol [acetaminophen] Nausea And Vomiting 07/10/2012  . Other Nausea And Vomiting, Rash, and Other (See Comments) 07/10/2012    Family History  Problem Relation Age of Onset  . Diabetes Mother   . Hypertension Mother   . Hypertension Father   . Arthritis Father   . Diabetes Father   . Colon cancer Neg Hx   . Gastric cancer Neg Hx   . Esophageal cancer Neg Hx     Social History   Socioeconomic History  . Marital status: Married    Spouse name: Not on  file  . Number of children: 4  . Years of education: Not on file  . Highest education level: Not on file  Occupational History  . Occupation: Musician     Comment: [employer]  Tobacco Use  . Smoking status: Former Smoker    Packs/day: 0.10    Years: 15.00    Pack years: 1.50    Types: Cigarettes    Quit date: 09/15/2012    Years since quitting: 7.5  . Smokeless tobacco: Never Used  Vaping Use  . Vaping Use: Never used  Substance and Sexual Activity  . Alcohol use: No  . Drug use: No  . Sexual activity: Yes  Other Topics Concern  . Not on file  Social History Narrative  . Not on file   Social Determinants of Health   Financial Resource Strain: Low Risk   . Difficulty of Paying Living Expenses: Not very hard  Food Insecurity: No Food Insecurity  . Worried About [employer] of  Food in the Last Year: Never true  . Ran Out of Food in the Last Year: Never true  Transportation Needs: No Transportation Needs  . Lack of Transportation (Medical): No  . Lack of Transportation (Non-Medical): No  Physical Activity: Unknown  . Days of Exercise per Week: Patient refused  . Minutes of Exercise per Session: Patient refused  Stress:   . Feeling of Stress :   Social Connections: Unknown  . Frequency of Communication with Friends and Family: Patient refused  . Frequency of Social Gatherings with Friends and Family: Patient refused  . Attends Religious Services: Patient refused  . Active Member of Clubs or Organizations: Patient refused  . Attends Archivist Meetings: Patient refused  . Marital Status: Patient refused    Subjective: Review of Systems  Constitutional: Negative for chills, fever, malaise/fatigue and weight loss.  HENT: Negative for congestion and sore throat.   Respiratory: Negative for cough and shortness of breath.   Cardiovascular: Negative for chest pain and palpitations.  Gastrointestinal: Negative for abdominal pain, blood in stool,  diarrhea, melena, nausea and vomiting.  Musculoskeletal: Negative for joint pain and myalgias.  Skin: Negative for rash.  Neurological: Negative for dizziness and weakness.  Endo/Heme/Allergies: Does not bruise/bleed easily.  Psychiatric/Behavioral: Negative for depression. The patient is not nervous/anxious.   All other systems reviewed and are negative.    Objective: There were no vitals taken for this visit. Physical Exam Vitals and nursing note reviewed.  Constitutional:      General: He is not in acute distress.    Appearance: Normal appearance. He is not ill-appearing, toxic-appearing or diaphoretic.  HENT:     Head: Normocephalic and atraumatic.     Nose: No congestion or rhinorrhea.  Eyes:     General: No scleral icterus. Cardiovascular:     Rate and Rhythm: Normal rate and regular rhythm.     Heart sounds: Normal heart sounds.  Pulmonary:     Effort: Pulmonary effort is normal.     Breath sounds: Normal breath sounds.  Abdominal:     General: Bowel sounds are normal. There is no distension.     Palpations: Abdomen is soft. There is no hepatomegaly, splenomegaly or mass.     Tenderness: There is no abdominal tenderness. There is no guarding or rebound.     Hernia: No hernia is present.  Musculoskeletal:     Cervical back: Neck supple.  Skin:    General: Skin is warm and dry.     Coloration: Skin is not jaundiced.     Findings: No bruising or rash.  Neurological:     General: No focal deficit present.     Mental Status: He is alert and oriented to person, place, and time. Mental status is at baseline.  Psychiatric:        Mood and Affect: Mood normal.        Behavior: Behavior normal.        Thought Content: Thought content normal.       03/18/2020 9:32 AM   Disclaimer: This note was dictated with voice recognition software. Similar sounding words can inadvertently be transcribed and may not be corrected upon review.

## 2020-03-18 NOTE — ED Triage Notes (Signed)
Pt c/o headache with nausea since 0200

## 2020-03-18 NOTE — Progress Notes (Deleted)
NEUROLOGY CONSULTATION NOTE  [name] MRN: [id number] DOB: May 16, 1971  Referring provider: Tula Nakayama, MD Primary care provider: Tula Nakayama, MD  Reason for consult:  Headache, abnormal brain scan  HISTORY OF PRESENT ILLNESS: [name] is a 49 year old ***-handed male with HTN who presents for headaches and abnormal brain scan.  History supplemented by referring provider and ED notes.  Patient has history of migraines since ***.  Patient has history of HTN and CKD stage IV and has been in the hospital for malignant hypertension with hypertensive encephalopathy.  During admission in October, MRI of brain personally reviewed showed mild chronic small vessel ischemic changes but no acute intracranial abnormality.  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  He was readmitted to the hospital in January for hypertensive urgency with headache, nausea and vomiting.  CT head personally reviewed showed no acute intracranial abnormality.  He was seen in the ED in March for nausea with vomiting and elevated blood pressure 165/100.  He was again evaluated in the ED on 03/18/2020 for headache with elevated blood pressure of 206/113.  CT of head personally reviewed was negative for acute intracranial abnormality.  Lumbar puncture to evaluate for sentinel bleed was considered but ultimately not performed.  He was treated with labetalol and headache cocktail.  Current NSAIDS:  *** Current analgesics:  *** Current triptans:  none Current ergotamine:  none Current anti-emetic:  Zofran ODT 4mg , promethazine 12.5mg  Current muscle relaxants:  none Current anti-anxiolytic:  hydroxyzine Current sleep aide:  none Current Antihypertensive medications:  Amlodipine, Lopressor, clonidine Current Antidepressant medications:  none Current Anticonvulsant medications:  none Current anti-CGRP:   none Current Vitamins/Herbal/Supplements:  none Current Antihistamines/Decongestants:  none Other therapy:  *** Hormone/birth control:  none  Past NSAIDS:  *** Past analgesics:  Fioricet, tramadol Past abortive triptans:  *** Past abortive ergotamine:  none Past muscle relaxants:  none Past anti-emetic:  *** Past antihypertensive medications:  Coreg, Cardura, furosemide, Maxzide Past antidepressant medications:  *** Past anticonvulsant medications:  topiramate Past anti-CGRP:  none Past vitamins/Herbal/Supplements:  none Past antihistamines/decongestants:  none Other past therapies:  ***  Caffeine:  *** Diet:  *** Exercise:  *** Depression:  ***; Anxiety:  *** Other pain:  Chronic back pain Sleep hygiene:  ***.  Has history of OSA, *** Family history of headache:  ***   PAST MEDICAL HISTORY: Past Medical History:  Diagnosis Date  . Acute kidney injury ([place])   . Bilateral renal cysts 02/23/2014   Noted in 2013 initially   . Chronic back pain   . Depression, major, single episode, severe ([place]) 08/13/2018   pHQ 9 score of 24, not suicidal or homicidal, score of 8 in 09/2018, no medication or therapy as of this time  . Duodenitis with bleeding 08/2008   Ulcer - admitted to [facility]   . Essential hypertension, benign   . GERD (gastroesophageal reflux disease)   . Headache(784.0)   . Hyperlipidemia   . Hypertensive urgency 10/08/2019  . Migraines   . Nonspecific abnormal electrocardiogram (ECG) (EKG) 08/30/2016  . Obesity, unspecified   . Sleep apnea   . Stress and adjustment reaction 11/04/2016    PAST SURGICAL HISTORY: Past Surgical History:  Procedure Laterality Date  . 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 CHOLECYSTECTOMY N/A 10/03/2018   Procedure: LAPAROSCOPIC CHOLECYSTECTOMY;  Surgeon:  Aviva Signs, MD;  Location: [facility];  Service: General;  Laterality: N/A;  . FINGER DEBRIDEMENT Left 09/25/2013   Thumb  . FOOT FRACTURE SURGERY Right     MEDICATIONS: Current Facility-Administered Medications on File Prior to Visit  Medication Dose Route Frequency Provider Last Rate Last Admin  . amLODipine (NORVASC) tablet 10 mg  10 mg Oral Once Rancour, Stephen, MD      . spironolactone (ALDACTONE) tablet 25 mg  25 mg Oral Once Rancour, Annie Main, MD       Current Outpatient Medications on File Prior to Visit  Medication Sig Dispense Refill  . amLODipine (NORVASC) 10 MG tablet Take 1 tablet (10 mg total) by mouth daily. (Patient taking differently: Take 10 mg by mouth in the morning and at bedtime. ) 90 tablet 3  . CLENPIQ 10-3.5-12 MG-GM -GM/160ML SOLN Take 320 mLs by mouth once.    . cloNIDine (CATAPRES - DOSED IN MG/24 HR) 0.3 mg/24hr patch Place 1 patch (0.3 mg total) onto the skin once a week. 12 patch 0  . esomeprazole (NEXIUM) 20 MG capsule Take 1 capsule (20 mg total) by mouth 2 (two) times daily before a meal. (Patient taking differently: Take 20 mg by mouth daily before breakfast. ) 60 capsule 3  . hydrOXYzine (VISTARIL) 50 MG capsule TAKE ONE CAPSULE BY MOUTH TWO TIMES DAILY AS NEEDED, FOR ANXIETY (Patient taking differently: Take 50 mg by mouth in the morning and at bedtime. ) 60 capsule 2  . metoprolol tartrate (LOPRESSOR) 25 MG tablet Take 1 tablet (25 mg total) by mouth 2 (two) times daily. 60 tablet 1  . ondansetron (ZOFRAN ODT) 4 MG disintegrating tablet Take 1 tablet (4 mg total) by mouth every 8 (eight) hours as needed for nausea or vomiting. (Patient not taking: Reported on 01/23/2020) 20 tablet 0  . ondansetron (ZOFRAN-ODT) 8 MG disintegrating tablet Take 8 mg by mouth 3 (three) times daily as needed for nausea/vomiting.    . promethazine (PHENERGAN) 12.5 MG tablet Take 1 tablet (12.5 mg total) by mouth every 6 (six) hours as needed for nausea or vomiting. 30 tablet 0  .  spironolactone (ALDACTONE) 25 MG tablet Take 25 mg by mouth 2 (two) times daily.       ALLERGIES: Allergies  Allergen Reactions  . Bee Venom Anaphylaxis  . Prochlorperazine Edisylate Other (See Comments)    Reaction: Jittery,uncomfortable feeling  . Cyclobenzaprine Hcl Other (See Comments)    REACTION: feel anxious, nausea  . Aleve [Naproxen Sodium] Nausea And Vomiting    States that this medication also causes abdominal pain  . Carbamazepine     REACTION: unknown reaction  . Geodon [Ziprasidone Hcl] Nausea And Vomiting  . Ibuprofen Nausea And Vomiting    States that this medication also causes abdominal pain  . Maxzide [Hydrochlorothiazide W-Triamterene]     headache  . Nsaids Other (See Comments)    H/o severe anemia from UGIB from ulcer  . Tylenol [Acetaminophen] Nausea And Vomiting    Patient states that this medication also causes abdominal pain  . Other Nausea And Vomiting, Rash and Other (See Comments)    ALL MUSCLE RELAXANTS: states that they affect sciatic nerve, may cause nausea and vomiting, rash    FAMILY HISTORY: Family History  Problem Relation Age of Onset  . Diabetes Mother   . Hypertension Mother   . Hypertension Father   .  Arthritis Father   . Diabetes Father   . Colon cancer Neg Hx   . Gastric cancer Neg Hx   . Esophageal cancer Neg Hx    ***.  SOCIAL HISTORY: Social History   Socioeconomic History  . Marital status: Married    Spouse name: Not on file  . Number of children: 4  . Years of education: Not on file  . Highest education level: Not on file  Occupational History  . Occupation: Musician     Comment: [employer]  Tobacco Use  . Smoking status: Former Smoker    Packs/day: 0.10    Years: 15.00    Pack years: 1.50    Types: Cigarettes    Quit date: 09/15/2012    Years since quitting: 7.5  . Smokeless tobacco: Never Used  Vaping Use  . Vaping Use: Never used  Substance and Sexual Activity  . Alcohol use: No  .  Drug use: No  . Sexual activity: Yes  Other Topics Concern  . Not on file  Social History Narrative  . Not on file   Social Determinants of Health   Financial Resource Strain: Low Risk   . Difficulty of Paying Living Expenses: Not very hard  Food Insecurity: No Food Insecurity  . Worried About [employer] in the Last Year: Never true  . Ran Out of Food in the Last Year: Never true  Transportation Needs: No Transportation Needs  . Lack of Transportation (Medical): No  . Lack of Transportation (Non-Medical): No  Physical Activity: Unknown  . Days of Exercise per Week: Patient refused  . Minutes of Exercise per Session: Patient refused  Stress:   . Feeling of Stress :   Social Connections: Unknown  . Frequency of Communication with Friends and Family: Patient refused  . Frequency of Social Gatherings with Friends and Family: Patient refused  . Attends Religious Services: Patient refused  . Active Member of Clubs or Organizations: Patient refused  . Attends Archivist Meetings: Patient refused  . Marital Status: Patient refused  Intimate Partner Violence: Not At Risk  . Fear of Current or Ex-Partner: No  . Emotionally Abused: No  . Physically Abused: No  . Sexually Abused: No    REVIEW OF SYSTEMS: Constitutional: No fevers, chills, or sweats, no generalized fatigue, change in appetite Eyes: No visual changes, double vision, eye pain Ear, nose and throat: No hearing loss, ear pain, nasal congestion, sore throat Cardiovascular: No chest pain, palpitations Respiratory:  No shortness of breath at rest or with exertion, wheezes GastrointestinaI: No nausea, vomiting, diarrhea, abdominal pain, fecal incontinence Genitourinary:  No dysuria, urinary retention or frequency Musculoskeletal:  No neck pain, back pain Integumentary: No rash, pruritus, skin lesions Neurological: as above Psychiatric: No depression, insomnia, anxiety Endocrine: No palpitations, fatigue,  diaphoresis, mood swings, change in appetite, change in weight, increased thirst Hematologic/Lymphatic:  No purpura, petechiae. Allergic/Immunologic: no itchy/runny eyes, nasal congestion, recent allergic reactions, rashes  PHYSICAL EXAM: *** General: No acute distress.  Patient appears ***-groomed.  *** Head:  Normocephalic/atraumatic Eyes:  fundi examined but not visualized Neck: supple, no paraspinal tenderness, full range of motion Back: No paraspinal tenderness Heart: regular rate and rhythm Lungs: Clear to auscultation bilaterally. Vascular: No carotid bruits. Neurological Exam: Mental status: alert and oriented to person, place, and time, recent and remote memory intact, fund of knowledge intact, attention and concentration intact, speech fluent and not dysarthric, language intact. Cranial nerves: CN I: not tested CN  II: pupils equal, round and reactive to light, visual fields intact CN III, IV, VI:  full range of motion, no nystagmus, no ptosis CN V: facial sensation intact CN VII: upper and lower face symmetric CN VIII: hearing intact CN IX, X: gag intact, uvula midline CN XI: sternocleidomastoid and trapezius muscles intact CN XII: tongue midline Bulk & Tone: normal, no fasciculations. Motor:  5/5 throughout *** Sensation:  Pinprick *** temperature *** and vibration sensation intact.  ***. Deep Tendon Reflexes:  2+ throughout, *** toes downgoing.  *** Finger to nose testing:  Without dysmetria.  *** Heel to shin:  Without dysmetria.  *** Gait:  Normal station and stride.  Able to turn and tandem walk. Romberg ***.  IMPRESSION: ***  PLAN: ***  Thank you for allowing me to take part in the care of this patient.  Metta Clines, DO  CC: ***

## 2020-03-18 NOTE — ED Provider Notes (Signed)
[facility] Provider Note   CSN: [id number] Arrival date & time: 03/18/20  3267     History Chief Complaint  Patient presents with  . Headache    [name] is a 49 y.o. male.  Patient with history of hypertension, migraines, CKD, acid reflux disease here with severe headache that he awoke with about 1:45 AM.  States he went to bed about 12 AM with no headache at that time.  When it got up to use the bathroom and noticed a severe headache in 1:45 AM behind his right ear radiating down the back of his head.  It is worse with palpation.  Headache is associated with nausea but no vomiting.  Does have photophobia.  No fever.  No focal weakness, numbness or tingling.  No difficulty speaking or difficulty swallowing.  Not take any medication at home for the headache.  Has a history of migraines and this headache feels similar but is in a different location. Denies this is the worst headache of his life.  States that headache was not there at 12 AM when he went to bed. Blood pressure is elevated.  Patient states compliance with his home medications including amlodipine, clonidine, Aldactone, metoprolol.  Patient has had admissions in the past for hypertensive emergency and encephalopathy.  The history is provided by the patient.  Headache Associated symptoms: dizziness, nausea and photophobia   Associated symptoms: no abdominal pain, no congestion, no cough, no fever, no myalgias, no numbness, no seizures and no vomiting        Past Medical History:  Diagnosis Date  . Acute kidney injury ([place])   . Bilateral renal cysts 02/23/2014   Noted in 2013 initially   . Chronic back pain   . Depression, major, single episode, severe ([place]) 08/13/2018   pHQ 9 score of 24, not suicidal or homicidal, score of 8 in 09/2018, no medication or therapy as of this time  . Duodenitis with bleeding 08/2008   Ulcer - admitted to [facility]   . Essential hypertension, benign   .  GERD (gastroesophageal reflux disease)   . Headache(784.0)   . Hyperlipidemia   . Hypertensive urgency 10/08/2019  . Migraines   . Nonspecific abnormal electrocardiogram (ECG) (EKG) 08/30/2016  . Obesity, unspecified   . Sleep apnea   . Stress and adjustment reaction 11/04/2016    Patient Active Problem List   Diagnosis Date Noted  . Nausea with vomiting 11/27/2019  . Unintentional weight loss 11/27/2019  . Dysphagia 11/27/2019  . Hypertension 06/25/2019  . Atrial premature depolarization   . Cholelithiasis 07/30/2018  . GERD (gastroesophageal reflux disease) 01/06/2018  . Hypertensive cardiomyopathy ([place]) 05/21/2017  . Sleep disorder 11/05/2014  . CKD (chronic kidney disease) stage 4, GFR 15-29 ml/min (HCC) 02/23/2014  . Fatigue due to sleep pattern disturbance 10/20/2008  . Malignant hypertension 10/09/2007  . Migraines 10/09/2007    Past Surgical History:  Procedure Laterality Date  . 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 CHOLECYSTECTOMY N/A 10/03/2018   Procedure: LAPAROSCOPIC CHOLECYSTECTOMY;  Surgeon: Aviva Signs, MD;  Location: [facility];  Service: General;  Laterality: N/A;  . FINGER DEBRIDEMENT Left 09/25/2013   Thumb  . FOOT FRACTURE SURGERY Right        Family History  Problem Relation Age of Onset  . Diabetes  Mother   . Hypertension Mother   . Hypertension Father   . Arthritis Father   . Diabetes Father   . Colon cancer Neg Hx   . Gastric cancer Neg Hx   . Esophageal cancer Neg Hx     Social History   Tobacco Use  . Smoking status: Former Smoker    Packs/day: 0.10    Years: 15.00    Pack years: 1.50    Types: Cigarettes    Quit date: 09/15/2012    Years since quitting: 7.5  . Smokeless tobacco: Never Used  Vaping Use  . Vaping Use: Never used  Substance Use Topics  . Alcohol  use: No  . Drug use: No    Home Medications Prior to Admission medications   Medication Sig Start Date End Date Taking? Authorizing Provider  amLODipine (NORVASC) 10 MG tablet Take 1 tablet (10 mg total) by mouth daily. Patient taking differently: Take 10 mg by mouth in the morning and at bedtime.  09/16/19   Fayrene Helper, MD  CLENPIQ 10-3.5-12 MG-GM -GM/160ML SOLN Take 320 mLs by mouth once. 12/02/19   [provider]  cloNIDine (CATAPRES - DOSED IN MG/24 HR) 0.3 mg/24hr patch Place 1 patch (0.3 mg total) onto the skin once a week. 03/04/20   Fayrene Helper, MD  esomeprazole (NEXIUM) 20 MG capsule Take 1 capsule (20 mg total) by mouth 2 (two) times daily before a meal. Patient taking differently: Take 20 mg by mouth daily before breakfast.  11/27/19   Carlis Stable, NP  hydrOXYzine (VISTARIL) 50 MG capsule TAKE ONE CAPSULE BY MOUTH TWO TIMES DAILY AS NEEDED, FOR ANXIETY Patient taking differently: Take 50 mg by mouth in the morning and at bedtime.  11/13/19   Fayrene Helper, MD  metoprolol tartrate (LOPRESSOR) 25 MG tablet Take 1 tablet (25 mg total) by mouth 2 (two) times daily. 10/09/19   Johnson, Clanford L, MD  ondansetron (ZOFRAN ODT) 4 MG disintegrating tablet Take 1 tablet (4 mg total) by mouth every 8 (eight) hours as needed for nausea or vomiting. Patient not taking: Reported on 01/23/2020 12/12/19   Fransico Meadow, PA-C  ondansetron (ZOFRAN-ODT) 8 MG disintegrating tablet Take 8 mg by mouth 3 (three) times daily as needed for nausea/vomiting. 10/28/19   [provider]  promethazine (PHENERGAN) 12.5 MG tablet Take 1 tablet (12.5 mg total) by mouth every 6 (six) hours as needed for nausea or vomiting. 11/27/19   Carlis Stable, NP  spironolactone (ALDACTONE) 25 MG tablet Take 25 mg by mouth 2 (two) times daily.  07/24/19   [provider]    Allergies    Bee venom, Prochlorperazine edisylate, Cyclobenzaprine hcl, Aleve [naproxen sodium], Carbamazepine,  Geodon [ziprasidone hcl], Ibuprofen, Maxzide [hydrochlorothiazide w-triamterene], Nsaids, Tylenol [acetaminophen], and Other  Review of Systems   Review of Systems  Constitutional: Negative for activity change, appetite change and fever.  HENT: Negative for congestion and rhinorrhea.   Eyes: Positive for photophobia. Negative for visual disturbance.  Respiratory: Negative for cough, chest tightness and shortness of breath.   Cardiovascular: Negative for chest pain and leg swelling.  Gastrointestinal: Positive for nausea. Negative for abdominal pain and vomiting.  Genitourinary: Positive for testicular pain. Negative for dysuria and hematuria.  Musculoskeletal: Negative for arthralgias and myalgias.  Neurological: Positive for dizziness and headaches. Negative for seizures and numbness.   all other systems are negative except as noted in the HPI and PMH.    Physical Exam Updated  Vital Signs BP (!) 206/113   Pulse (!) 52   Temp 98.8 F (37.1 C)   Resp 18   Ht 5\' 9"  (1.753 m)   Wt 90.7 kg   SpO2 100%   BMI 29.53 kg/m   Physical Exam Vitals and nursing note reviewed.  Constitutional:      General: He is not in acute distress.    Appearance: He is well-developed.  HENT:     Head: Normocephalic and atraumatic.     Comments: Tenderness behind right ear.     Mouth/Throat:     Pharynx: No oropharyngeal exudate.  Eyes:     Conjunctiva/sclera: Conjunctivae normal.     Pupils: Pupils are equal, round, and reactive to light.     Comments: photophobic  Neck:     Comments: No meningismus. Cardiovascular:     Rate and Rhythm: Normal rate and regular rhythm.     Heart sounds: Normal heart sounds. No murmur heard.   Pulmonary:     Effort: Pulmonary effort is normal. No respiratory distress.     Breath sounds: Normal breath sounds.  Chest:     Chest wall: No tenderness.  Abdominal:     Palpations: Abdomen is soft.     Tenderness: There is no abdominal tenderness. There is no  guarding or rebound.  Musculoskeletal:        General: No tenderness. Normal range of motion.     Cervical back: Normal range of motion and neck supple.  Skin:    General: Skin is warm.  Neurological:     Mental Status: He is alert and oriented to person, place, and time.     Cranial Nerves: No cranial nerve deficit.     Motor: No abnormal muscle tone.     Coordination: Coordination normal.     Comments: No ataxia on finger to nose bilaterally. No pronator drift. 5/5 strength throughout. CN 2-12 intact.Equal grip strength. Sensation intact.   Psychiatric:        Behavior: Behavior normal.     ED Results / Procedures / Treatments   Labs (all labs ordered are listed, but only abnormal results are displayed) Labs Reviewed  CBC WITH DIFFERENTIAL/PLATELET - Abnormal; Notable for the following components:      Result Value   RBC 3.15 (*)    Hemoglobin 10.1 (*)    HCT 30.2 (*)    RDW 11.4 (*)    All other components within normal limits  BASIC METABOLIC PANEL - Abnormal; Notable for the following components:   Glucose, Bld 104 (*)    BUN 37 (*)    Creatinine, Ser 3.65 (*)    Calcium 8.7 (*)    GFR calc non Af Amer 18 (*)    GFR calc Af Amer 21 (*)    All other components within normal limits    EKG None  Radiology CT Head Wo Contrast  Result Date: 03/18/2020 CLINICAL DATA:  Headache, nausea since 2 a.m. EXAM: CT HEAD WITHOUT CONTRAST TECHNIQUE: Contiguous axial images were obtained from the base of the skull through the vertex without intravenous contrast. COMPARISON:  12/11/2019 FINDINGS: Brain: No acute infarct or hemorrhage. Lateral ventricles and midline structures are unremarkable. No acute extra-axial fluid collections. No mass effect. Vascular: No hyperdense vessel or unexpected calcification. Skull: Normal. Negative for fracture or focal lesion. Sinuses/Orbits: No acute finding. Other: None. IMPRESSION: No acute intracranial process. Electronically Signed   By: Randa Ngo M.D.   On: 03/18/2020 03:59  Procedures Procedures (including critical care time)  Medications Ordered in ED Medications  metoCLOPramide (REGLAN) injection 10 mg (has no administration in time range)  diphenhydrAMINE (BENADRYL) injection 25 mg (has no administration in time range)  labetalol (NORMODYNE) injection 20 mg (has no administration in time range)  ondansetron (ZOFRAN) injection 4 mg (has no administration in time range)    ED Course  I have reviewed the triage vital signs and the nursing notes.  Pertinent labs & imaging results that were available during my care of the patient were reviewed by me and considered in my medical decision making (see chart for details).    MDM Rules/Calculators/A&P                         Patient with severe headache that woke him from sleep at 1:45 AM.  No neurological deficits.  No fever.  CT head obtained within 6 hours of sudden onset headache is negative for hemorrhage. This effectively rules out Subarachnoid hemorrhage per most recent literature.  Patient feels improved after labetalol, Reglan, Benadryl.  Case discussed with Dr. Malen Gauze of neurology.  He agrees subarachnoid hemorrhage effectively ruled out with negative CT scan within 6 hours of headache onset.  Does not feel strongly that patient needs MRA or CTA.  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  With negative CT head today, as well as improving symptoms, Dr. Malen Gauze agrees that MRA or lumbar puncture does not seem necessary.  D/w patient. He agrees and is feeling improved and actually requesting discharge. His blood pressure has improved to 150/100 after his home meds. He states his headache is much better and he is ready to go home.  D/w patient that [facility] seems unlikely but lumbar puncture is the only definitive test. Cannot receive CTA due to kidney function.  Patient understands this and  is not interested in lumbar puncture. He appears to have capacity to make this decision.   Continue BP meds. Advised followup with his nephrologist over progressively worsening renal function over last 6 months.  Return to the ED with sudden onset headache, focal weakness, chest pain, shortness of breath, difficulty speaking, confusion, or any other concerns.  Final Clinical Impression(s) / ED Diagnoses Final diagnoses:  Bad headache    Rx / DC Orders ED Discharge Orders    None       Inri Sobieski, Annie Main, MD 03/18/20 [phone]

## 2020-03-20 ENCOUNTER — Ambulatory Visit: Payer: BC Managed Care – PPO | Admitting: Neurology

## 2020-04-09 DIAGNOSIS — I129 Hypertensive chronic kidney disease with stage 1 through stage 4 chronic kidney disease, or unspecified chronic kidney disease: Secondary | ICD-10-CM | POA: Diagnosis not present

## 2020-04-09 DIAGNOSIS — N184 Chronic kidney disease, stage 4 (severe): Secondary | ICD-10-CM | POA: Diagnosis not present

## 2020-04-09 DIAGNOSIS — R112 Nausea with vomiting, unspecified: Secondary | ICD-10-CM | POA: Diagnosis not present

## 2020-04-26 ENCOUNTER — Emergency Department (HOSPITAL_COMMUNITY)
Admission: EM | Admit: 2020-04-26 | Discharge: 2020-04-26 | Discharge status: Against Medical Advice | Payer: BC Managed Care – PPO

## 2020-05-12 ENCOUNTER — Telehealth: Payer: Self-pay

## 2020-05-12 NOTE — Telephone Encounter (Signed)
PT called to say he had a run in with [facility] office, he didn't have all of his paperwork, and they would not see him. He said his BP was elevated, he needs someone to call him

## 2020-05-12 NOTE — Telephone Encounter (Signed)
Patient scheduled to see provider on 8/30

## 2020-05-18 ENCOUNTER — Encounter: Payer: Self-pay | Admitting: Family Medicine

## 2020-05-18 ENCOUNTER — Ambulatory Visit (INDEPENDENT_AMBULATORY_CARE_PROVIDER_SITE_OTHER): Payer: BC Managed Care – PPO | Admitting: Family Medicine

## 2020-05-18 ENCOUNTER — Other Ambulatory Visit: Payer: Self-pay

## 2020-05-18 VITALS — BP 122/80 | HR 78 | Resp 16 | Ht 69.0 in | Wt 191.0 lb

## 2020-05-18 DIAGNOSIS — I10 Essential (primary) hypertension: Secondary | ICD-10-CM

## 2020-05-18 DIAGNOSIS — Z23 Encounter for immunization: Secondary | ICD-10-CM | POA: Diagnosis not present

## 2020-05-18 DIAGNOSIS — I150 Renovascular hypertension: Secondary | ICD-10-CM | POA: Diagnosis not present

## 2020-05-18 DIAGNOSIS — N184 Chronic kidney disease, stage 4 (severe): Secondary | ICD-10-CM

## 2020-05-18 DIAGNOSIS — Z125 Encounter for screening for malignant neoplasm of prostate: Secondary | ICD-10-CM

## 2020-05-18 DIAGNOSIS — E559 Vitamin D deficiency, unspecified: Secondary | ICD-10-CM

## 2020-05-18 DIAGNOSIS — G43709 Chronic migraine without aura, not intractable, without status migrainosus: Secondary | ICD-10-CM

## 2020-05-18 DIAGNOSIS — R519 Headache, unspecified: Secondary | ICD-10-CM | POA: Diagnosis not present

## 2020-05-18 MED ORDER — AMLODIPINE BESYLATE 10 MG PO TABS: ORAL_TABLET | ORAL | 3 refills | Status: DC

## 2020-05-18 MED ORDER — SPIRONOLACTONE 25 MG PO TABS: 25.0000 mg | ORAL_TABLET | Freq: Two times a day (BID) | ORAL | 0 refills | Status: DC

## 2020-05-18 NOTE — Patient Instructions (Signed)
Exam in office with MD in 10 weeks, call if you need me sooner'   Flu vaccine today   Fasting labs asap, lipid, cmp ad ERGFR, pSA, CBC, tSH and vit D   You have an appointment with Neurology this week re headaches  Please get covid vaccines in the next 2 weeks  You will b be referred for colonoscopy to Dr Sydell Axon, important you get this.  Stay on same blood pressure meds please  Thanks for choosing [facility], we consider it a privelige to serve you.

## 2020-05-18 NOTE — Progress Notes (Signed)
   [name]     MRN: [id number]      DOB: 1971/05/04   HPI [name] is here for follow up and re-evaluation of chronic medical conditions, medication management and review of any available recent lab and radiology data.  Preventive health is updated, specifically  Cancer screening and Immunization.   Did not keep recent appt with Neurology and wishes to get new Dr Uncontrolled headacdhes are the concern, daily morning headache in past 2 months, nausea with this, uncontrollable vomiting forehead and temples throbbing, sleeping gives a little relief nausea medication helps, rated a t a 10 , whole day debilitating hree times / week on average   ROS Denies recent fever or chills. Denies sinus pressure, nasal congestion, ear pain or sore throat. Denies chest congestion, productive cough or wheezing. Denies chest pains, palpitations and leg swelling Denies abdominal pain, nausea, vomiting,diarrhea or constipation.   Denies dysuria, frequency, hesitancy or incontinence. Denies joint pain, swelling and limitation in mobility.  Denies skin break down or rash.   PE  BP 122/80   Pulse 78   Resp 16   Ht 5\' 9"  (1.753 m)   Wt 191 lb 0.6 oz (86.7 kg)   SpO2 96%   BMI 28.21 kg/m   Patient alert and oriented and in no cardiopulmonary distress.  HEENT: No facial asymmetry, EOMI,     Neck supple .  Chest: Clear to auscultation bilaterally.  CVS: S1, S2 no murmurs, no S3.Regular rate.  ABD: Soft non tender.   Ext: No edema  MS: Adequate ROM spine, shoulders, hips and knees.  Skin: Intact, no ulcerations or rash noted.  Psych: Good eye contact, normal affect. Memory intact not anxious or depressed appearing.  CNS: CN 2-12 intact, power,  normal throughout.no focal deficits noted.   Assessment & Plan  Malignant hypertension Controlled, no change in medication DASH diet and commitment to daily physical activity for a minimum of 30 minutes discussed and encouraged, as a  part of hypertension management. The importance of attaining a healthy weight is also discussed.  BP/Weight 05/21/2020 05/18/2020 03/18/2020 12/12/2019 12/11/2019 11/27/2019 12/16/5186  Systolic BP 416 606 301 601 - 093 235  Diastolic BP 91 80 573 80 - 97 98  Wt. (Lbs) 189.2 191.04 200 - 191.4 191.4 195  BMI 27.15 28.21 29.53 - 28.26 28.26 27.98       Migraines Uncontrolled escalated in terms of both frequency and severity, refer Neurology  CKD (chronic kidney disease) stage 4, GFR 15-29 ml/min (HCC) Needs to follow regular with Nephrology and is reminded of this

## 2020-05-20 ENCOUNTER — Ambulatory Visit: Payer: BC Managed Care – PPO | Admitting: Neurology

## 2020-05-20 ENCOUNTER — Telehealth: Payer: Self-pay

## 2020-05-20 NOTE — Telephone Encounter (Signed)
Pt arrived past 5+ min late for his 05/20/2020 sleep consult.  Pt was rescheduled by the front desk.

## 2020-05-21 ENCOUNTER — Other Ambulatory Visit: Payer: Self-pay

## 2020-05-21 ENCOUNTER — Encounter: Payer: Self-pay | Admitting: Internal Medicine

## 2020-05-21 ENCOUNTER — Ambulatory Visit: Payer: BC Managed Care – PPO | Admitting: Nurse Practitioner

## 2020-05-21 ENCOUNTER — Encounter: Payer: Self-pay | Admitting: Nurse Practitioner

## 2020-05-21 VITALS — BP 148/91 | HR 73 | Temp 97.7°F | Ht 70.0 in | Wt 189.2 lb

## 2020-05-21 DIAGNOSIS — R634 Abnormal weight loss: Secondary | ICD-10-CM

## 2020-05-21 DIAGNOSIS — K219 Gastro-esophageal reflux disease without esophagitis: Secondary | ICD-10-CM | POA: Diagnosis not present

## 2020-05-21 DIAGNOSIS — R112 Nausea with vomiting, unspecified: Secondary | ICD-10-CM | POA: Diagnosis not present

## 2020-05-21 NOTE — Patient Instructions (Addendum)
Your health issues we discussed today were:   GERD (reflux/heartburn) with persistent nausea and vomiting: 1. Increase your Nexium to twice daily. Take this first thing in the morning on an empty stomach and then in the evening 30 minutes before your last meal of the day\ 2. We will schedule upper endoscopy for you to further evaluate your symptoms 3. Call us for any worsening or severe symptoms  Need for colonoscopy: 1. Due to your age, we will schedule a colonoscopy for you 2. Further recommendations will follow your colonoscopy  Overall I recommend:  1. Continue taking your other current medications 2. Return for follow-up in 3 months 3. Call us for any questions or concerns   ---------------------------------------------------------------  I am glad you have scheduled a COVID-19 vaccine!  Keep your appointment next week to get your first dose of vaccine and your appointment to get your follow-up/second dose of vaccine (if applicable)  ---------------------------------------------------------------   At [facility] we value your feedback. You may receive a survey about your visit today. Please share your experience as we strive to create trusting relationships with our patients to provide genuine, compassionate, quality care.  We appreciate your understanding and patience as we review any laboratory studies, imaging, and other diagnostic tests that are ordered as we care for you. Our office policy is 5 business days for review of these results, and any emergent or urgent results are addressed in a timely manner for your best interest. If you do not hear from our office in 1 week, please contact us.   We also encourage the use of MyChart, which contains your medical information for your review as well. If you are not enrolled in this feature, an access code is on this after visit summary for your convenience. Thank you for allowing [place] to be involved in your care.  It  was great to see you today!  I hope you have a great rest of your summer very pleasant!!

## 2020-05-21 NOTE — Progress Notes (Signed)
Referring Provider: Fayrene Helper, MD Primary Care Physician:  Fayrene Helper, MD Primary GI:  Dr. Gala Romney  Chief Complaint  Patient presents with  . Nausea    and vomiting every few days  . Gastroesophageal Reflux    HPI:   [name] is a 49 y.o. male who presents for follow-up on GERD, N/V. The patient was last seen in our office 11/27/19 for GERD, N/V, dysphagia, unintentional weight loss.  Noted history of hypertension, migraines, GERD, advanced chronic kidney disease creatinine close to 3/GFR 26.  Previous EGD at [facility] for duodenal ulcer with possible malignant ulcer with findings included healed duodenal ulcer, edematous lumen.  Recommended consider future surgical need if symptoms persist.  At his last visit noted nausea and "crazy abdominal symptoms" prior to cholecystectomy which stopped with help.  Since surgery symptoms have worsened.  Persistent nausea 2-3 times a week, always morning time.  GERD doing pretty well if he takes his medication.  Has a knot like a ball in his epigastrium which is painful and intermittent.  Some solid food dysphagia without regurgitation.  No other overt GI complaints.  No NSAIDs.  Has never had a colonoscopy before.  States he has lost about 25 pounds in the past 6 months.  Recommended increase Nexium to twice a day to help with morning time symptoms, EGD, colonoscopy, Phenergan for nausea, follow-up in 3 months.  The patient had an EGD/colonoscopy scheduled for 02/10/2020 but unfortunately he was a no-show to his Covid test and the procedure was subsequently canceled.  His follow-up visit scheduled for 03/18/2020 was also canceled.  Today he states he is doing okay overall. He's still having GERD symptoms with nausea in the morning. Taking his Nexium once a day. He at some point switched to twice a day as recommended but then went back to once a day, but I am not sure why? Nausea is still morning  time. Occasional vomiting. Has GERD symptoms about 1-2 times a week. Worse with specific triggers (has been avoiding triggers). Has associated epigastric pain, which is his biggest complaint. Denies hematochezia, melena, fever, chills. He is down about 2 lbs since March (total weight loss just over 25 lbs). No more solid food dysphagia. Denies URI or flu-like symptoms. Denies loss of sense of taste or smell. The patient has not received COVID-19 vaccination(s). They are scheduled for first dose next week at [facility]. Denies chest pain, dyspnea, dizziness, lightheadedness, syncope, near syncope. Denies any other upper or lower GI symptoms.  Past Medical History:  Diagnosis Date  . Acute kidney injury ([place])   . Bilateral renal cysts 02/23/2014   Noted in 2013 initially   . Chronic back pain   . Depression, major, single episode, severe ([place]) 08/13/2018   pHQ 9 score of 24, not suicidal or homicidal, score of 8 in 09/2018, no medication or therapy as of this time  . Duodenitis with bleeding 08/2008   Ulcer - admitted to [facility]   . Essential hypertension, benign   . GERD (gastroesophageal reflux disease)   . Headache(784.0)   . Hyperlipidemia   . Hypertensive urgency 10/08/2019  . Migraines   . Nonspecific abnormal electrocardiogram (ECG) (EKG) 08/30/2016  . Obesity, unspecified   . Sleep apnea   . Stress and adjustment reaction 11/04/2016    Past Surgical History:  Procedure Laterality Date  . 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 CHOLECYSTECTOMY N/A 10/03/2018   Procedure: LAPAROSCOPIC CHOLECYSTECTOMY;  Surgeon: Aviva Signs, MD;  Location: [facility];  Service: General;  Laterality: N/A;  . FINGER DEBRIDEMENT Left 09/25/2013   Thumb  . FOOT FRACTURE SURGERY Right     Current Outpatient Medications  Medication  Sig Dispense Refill  . amLODipine (NORVASC) 10 MG tablet Take one tablet by mouth at 8 am every morning and take one tablet by mouth at 8 pm every evening for blood pressure 60 tablet 3  . cloNIDine (CATAPRES - DOSED IN MG/24 HR) 0.3 mg/24hr patch Place 1 patch (0.3 mg total) onto the skin once a week. 12 patch 0  . esomeprazole (NEXIUM) 20 MG capsule Take 1 capsule (20 mg total) by mouth 2 (two) times daily before a meal. (Patient taking differently: Take 20 mg by mouth daily before breakfast. ) 60 capsule 3  . hydrOXYzine (VISTARIL) 50 MG capsule TAKE ONE CAPSULE BY MOUTH TWO TIMES DAILY AS NEEDED, FOR ANXIETY (Patient taking differently: Take 50 mg by mouth in the morning and at bedtime. ) 60 capsule 2  . metoprolol tartrate (LOPRESSOR) 25 MG tablet Take 1 tablet (25 mg total) by mouth 2 (two) times daily. 60 tablet 1  . ondansetron (ZOFRAN-ODT) 8 MG disintegrating tablet Take 8 mg by mouth 3 (three) times daily as needed for nausea/vomiting.    . promethazine (PHENERGAN) 12.5 MG tablet Take 1 tablet (12.5 mg total) by mouth every 6 (six) hours as needed for nausea or vomiting. 30 tablet 0  . spironolactone (ALDACTONE) 25 MG tablet Take 1 tablet (25 mg total) by mouth 2 (two) times daily. 180 tablet 0   No current facility-administered medications for this visit.    Allergies as of 05/21/2020 - Review Complete 05/21/2020  Allergen Reaction Noted  . Bee venom Anaphylaxis 07/04/2011  . Prochlorperazine edisylate Other (See Comments) 01/07/2009  . Cyclobenzaprine hcl Other (See Comments) 01/07/2009  . Aleve [naproxen sodium] Nausea And Vomiting 07/10/2012  . Carbamazepine    . Geodon [ziprasidone hcl] Nausea And Vomiting 08/31/2015  . Ibuprofen Nausea And Vomiting 07/10/2012  . Maxzide [hydrochlorothiazide w-triamterene]  02/19/2014  . Nsaids Other (See Comments) 06/21/2014  . Tylenol [acetaminophen] Nausea And Vomiting 07/10/2012  . Other Nausea And Vomiting, Rash, and Other (See Comments)  07/10/2012    Family History  Problem Relation Age of Onset  . Diabetes Mother   . Hypertension Mother   . Hypertension Father   . Arthritis Father   . Diabetes Father   . Colon cancer Neg Hx   . Gastric cancer Neg Hx   . Esophageal cancer Neg Hx     Social History   Socioeconomic History  . Marital status: Married    Spouse name: Not on file  . Number of children: 4  . Years of education: Not on file  . Highest education level: Not on file  Occupational History  . Occupation: Musician     Comment: [employer]  Tobacco Use  . Smoking status: Former Smoker    Packs/day: 0.10    Years: 15.00    Pack years: 1.50    Types: Cigarettes    Quit date: 09/15/2012    Years since quitting: 7.6  . Smokeless tobacco: Never Used  Vaping Use  . Vaping Use: Never used  Substance and Sexual Activity  . Alcohol use: No  .  Drug use: No  . Sexual activity: Yes  Other Topics Concern  . Not on file  Social History Narrative  . Not on file   Social Determinants of Health   Financial Resource Strain: Low Risk   . Difficulty of Paying Living Expenses: Not very hard  Food Insecurity: No Food Insecurity  . Worried About Charity fundraiser in the Last Year: Never true  . Ran Out of Food in the Last Year: Never true  Transportation Needs: No Transportation Needs  . Lack of Transportation (Medical): No  . Lack of Transportation (Non-Medical): No  Physical Activity: Unknown  . Days of Exercise per Week: Patient refused  . Minutes of Exercise per Session: Patient refused  Stress:   . Feeling of Stress : Not on file  Social Connections: Unknown  . Frequency of Communication with Friends and Family: Patient refused  . Frequency of Social Gatherings with Friends and Family: Patient refused  . Attends Religious Services: Patient refused  . Active Member of Clubs or Organizations: Patient refused  . Attends Archivist Meetings: Patient refused  . Marital  Status: Patient refused    Subjective: Review of Systems  Constitutional: Negative for chills, fever, malaise/fatigue and weight loss.  HENT: Negative for congestion and sore throat.   Respiratory: Negative for cough and shortness of breath.   Cardiovascular: Negative for chest pain and palpitations.  Gastrointestinal: Negative for abdominal pain, blood in stool, diarrhea, melena, nausea and vomiting.  Musculoskeletal: Negative for joint pain and myalgias.  Skin: Negative for rash.  Neurological: Negative for dizziness and weakness.  Endo/Heme/Allergies: Does not bruise/bleed easily.  Psychiatric/Behavioral: Negative for depression. The patient is not nervous/anxious.   All other systems reviewed and are negative.    Objective: BP (!) 148/91   Pulse 73   Temp 97.7 F (36.5 C) (Temporal)   Ht _0  (1.778 m)   Wt 189 lb 3.2 oz (85.8 kg)   BMI 27.15 kg/m  Physical Exam Vitals and nursing note reviewed.  Constitutional:      General: He is not in acute distress.    Appearance: Normal appearance. He is not ill-appearing, toxic-appearing or diaphoretic.  HENT:     Head: Normocephalic and atraumatic.     Nose: No congestion or rhinorrhea.  Eyes:     General: No scleral icterus. Cardiovascular:     Rate and Rhythm: Normal rate and regular rhythm.     Heart sounds: Normal heart sounds.  Pulmonary:     Effort: Pulmonary effort is normal.     Breath sounds: Normal breath sounds.  Abdominal:     General: Bowel sounds are normal. There is no distension.     Palpations: Abdomen is soft. There is no hepatomegaly, splenomegaly or mass.     Tenderness: There is no abdominal tenderness. There is no guarding or rebound.     Hernia: No hernia is present.  Musculoskeletal:     Cervical back: Neck supple.  Skin:    General: Skin is warm and dry.     Coloration: Skin is not jaundiced.     Findings: No bruising or rash.  Neurological:     General: No focal deficit present.      Mental Status: He is alert and oriented to person, place, and time. Mental status is at baseline.  Psychiatric:        Mood and Affect: Mood normal.        Behavior: Behavior normal.  Thought Content: Thought content normal.      Assessment:  Very pleasant 49 year old male with persistent GERD and nausea/vomiting (typically in the morning times).  Previously recommended increase Nexium to twice daily, however he is currently still on it once daily.  I recommended again the ED increases.  Previous solid food dysphagia is no longer ongoing.  However, given his persistent symptoms on PPI we will plan for endoscopic evaluation for possible etiologies.  Differentials include esophagitis, gastritis, duodenitis, gastric or duodenal erosions, peptic ulcer disease, H. pylori.  Less likely gastric cancer or esophageal cancer.  Also noted history of duodenal ulcer as outlined in HPI.  Given that he is also 49 years old he is due for colonoscopy we will schedule this at this time as well.  Further recommendations will follow  Also noted unintentional weight loss as outlined and HPI.  This is one of the original etiologies behind endoscopy and colonoscopy.  His weight seems to have stabilized, but still remains down about 25 pounds.  Proceed with colonoscopy and EGD on propofol/MAC by Dr. Gala Romney in near future: the risks, benefits, and alternatives have been discussed with the patient in detail. The patient states understanding and desires to proceed.  The patient was previously on oxycodone and states he has an appointment upcoming with his neurologist to restart pain medicines.  He also manages his nausea with Phenergan.The patient is not on any other anticoagulants, anxiolytics, chronic pain medications, antidepressants, antidiabetics, or iron supplements.  We will plan for the procedure on propofol/MAC to promote adequate sedation as was planned for his last scheduled procedure.  ASA III (poorly  controlled hypertension and chronic kidney disease)   Plan: 1. EGD and colonoscopy 2. Increase Nexium to twice daily 3. Follow-up in 3 months   Thank you for allowing [place] to participate in the care of [place], DNP, AGNP-C Adult & Gerontological Nurse Practitioner [facility]  05/21/2020 2:36 PM   Disclaimer: This note was dictated with voice recognition software. Similar sounding words can inadvertently be transcribed and may not be corrected upon review.

## 2020-05-21 NOTE — Progress Notes (Signed)
Cc'ed to pcp °

## 2020-05-24 ENCOUNTER — Encounter: Payer: Self-pay | Admitting: Family Medicine

## 2020-05-24 NOTE — Assessment & Plan Note (Signed)
Controlled, no change in medication DASH diet and commitment to daily physical activity for a minimum of 30 minutes discussed and encouraged, as a part of hypertension management. The importance of attaining a healthy weight is also discussed.  BP/Weight 05/21/2020 05/18/2020 03/18/2020 12/12/2019 12/11/2019 11/27/2019 9/79/8921  Systolic BP 194 174 081 448 - 185 631  Diastolic BP 91 80 497 80 - 97 98  Wt. (Lbs) 189.2 191.04 200 - 191.4 191.4 195  BMI 27.15 28.21 29.53 - 28.26 28.26 27.98

## 2020-05-24 NOTE — Assessment & Plan Note (Signed)
Needs to follow regular with Nephrology and is reminded of this

## 2020-05-24 NOTE — Assessment & Plan Note (Signed)
Uncontrolled escalated in terms of both frequency and severity, refer Neurology

## 2020-05-26 ENCOUNTER — Telehealth: Payer: Self-pay | Admitting: *Deleted

## 2020-05-26 NOTE — Telephone Encounter (Signed)
LMOVM for pt to schedule TCS/EGD w/ Dr. Gala Romney, propofol, ASA 3

## 2020-05-28 NOTE — Telephone Encounter (Signed)
Lmovm. LETTER MAILED

## 2020-06-30 ENCOUNTER — Other Ambulatory Visit: Payer: Self-pay | Admitting: Family Medicine

## 2020-07-23 ENCOUNTER — Ambulatory Visit: Payer: BC Managed Care – PPO | Admitting: Neurology

## 2020-07-23 ENCOUNTER — Telehealth: Payer: Self-pay | Admitting: *Deleted

## 2020-07-23 ENCOUNTER — Encounter: Payer: Self-pay | Admitting: Neurology

## 2020-07-23 NOTE — Telephone Encounter (Signed)
Patient was no show for new patient appointment today. 

## 2020-07-27 ENCOUNTER — Ambulatory Visit: Payer: BC Managed Care – PPO | Admitting: Family Medicine

## 2020-07-27 ENCOUNTER — Ambulatory Visit: Payer: BC Managed Care – PPO | Admitting: Internal Medicine

## 2020-08-19 ENCOUNTER — Encounter: Payer: Self-pay | Admitting: Internal Medicine

## 2020-08-19 ENCOUNTER — Other Ambulatory Visit: Payer: Self-pay | Admitting: Family Medicine

## 2020-08-19 ENCOUNTER — Ambulatory Visit: Payer: BC Managed Care – PPO | Admitting: Nurse Practitioner

## 2020-08-19 NOTE — Progress Notes (Deleted)
Referring Provider: Fayrene Helper, MD Primary Care Physician:  Fayrene Helper, MD Primary GI:  Dr. Gala Romney  No chief complaint on file.   HPI:   [name] is a 49 y.o. male who presents for follow-up on GERD, nausea, vomiting.  The patient was last seen in our office 05/21/2020 for the same as well as unintentional weight loss.  Noted history of hypertension, migraines, GERD, advanced chronic kidney disease with a creatinine close to 3/GFR 26.  Previous EGD at [facility] for duodenal ulcer with possible malignant ulcer and findings included healed duodenal ulcer, edematous lumen and recommended consider future surgical need if symptoms persist.  History of "crazy abdominal symptoms" that improved with status post cholecystectomy.  However, postoperative the symptoms have worsened including nausea 2-3 times a week typically morning time and GERD well managed if he takes medication.  Has a "knot like a ball" in his epigastrium which is intermittent painful as well as some solid food dysphagia.  Subjective weight loss of 25 pounds in 6 months.  His Nexium was increased and EGD and colonoscopy are planned.  Initially his EGD/colonoscopy scheduled for 02/10/2020 but he was a no-show.  His follow-up office visit in June 2021 was also canceled.  At his last visit still with GERD and morning time nausea despite Nexium once daily.  At some point he switched it to twice daily but went back to once a day, although not sure why.  Occasional vomiting.  GERD breakthrough 1-2 times a week worse with triggers that he typically avoids.  Weeks complains epigastric pain.  Only down 2 pounds since March, subjectively.  Again recommended EGD and colonoscopy, increase Nexium to twice daily, follow-up in 3 months.  Our office attempted to schedule his procedures and sent a letter when there was no answer on his phone, however he never called to schedule.  Today he states he is doing okay  overall.  Past Medical History:  Diagnosis Date  . Acute kidney injury ([place])   . Bilateral renal cysts 02/23/2014   Noted in 2013 initially   . Chronic back pain   . Depression, major, single episode, severe ([place]) 08/13/2018   pHQ 9 score of 24, not suicidal or homicidal, score of 8 in 09/2018, no medication or therapy as of this time  . Duodenitis with bleeding 08/2008   Ulcer - [facility]   . Essential hypertension, benign   . GERD (gastroesophageal reflux disease)   . Headache(784.0)   . Hyperlipidemia   . Hypertensive urgency 10/08/2019  . Migraines   . Nonspecific abnormal electrocardiogram (ECG) (EKG) 08/30/2016  . Obesity, unspecified   . Sleep apnea   . Stress and adjustment reaction 11/04/2016    Past Surgical History:  Procedure Laterality Date  . 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   Procedure: 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.;  Surgeon: Jolyn Nap, MD;  Location: [facility];  Service: Orthopedics;  Laterality: Left;  . CHOLECYSTECTOMY N/A 10/03/2018   Procedure: LAPAROSCOPIC CHOLECYSTECTOMY;  Surgeon: Aviva Signs, MD;  Location: [facility];  Service: General;  Laterality: N/A;  . FINGER DEBRIDEMENT Left 09/25/2013   Thumb  . FOOT FRACTURE SURGERY Right     Current Outpatient Medications  Medication Sig Dispense Refill  . amLODipine (NORVASC) 10 MG tablet Take one tablet by mouth at 8 am every morning and take one tablet by mouth at 8 pm every evening for blood pressure 60  tablet 3  . cloNIDine (CATAPRES - DOSED IN MG/24 HR) 0.3 mg/24hr patch APPLY 1 PATCH(0.3 MG) TOPICALLY TO THE SKIN 1 TIME A WEEK 12 patch 0  . esomeprazole (NEXIUM) 20 MG capsule Take 1 capsule (20 mg total) by mouth 2 (two) times daily before a meal. (Patient taking differently: Take 20 mg by mouth daily before breakfast. ) 60 capsule 3  . hydrOXYzine (VISTARIL) 50 MG capsule TAKE ONE CAPSULE BY MOUTH TWO TIMES DAILY AS NEEDED, FOR ANXIETY (Patient taking  differently: Take 50 mg by mouth in the morning and at bedtime. ) 60 capsule 2  . metoprolol tartrate (LOPRESSOR) 25 MG tablet Take 1 tablet (25 mg total) by mouth 2 (two) times daily. 60 tablet 1  . ondansetron (ZOFRAN-ODT) 8 MG disintegrating tablet Take 8 mg by mouth 3 (three) times daily as needed for nausea/vomiting.    . promethazine (PHENERGAN) 12.5 MG tablet Take 1 tablet (12.5 mg total) by mouth every 6 (six) hours as needed for nausea or vomiting. 30 tablet 0  . spironolactone (ALDACTONE) 25 MG tablet Take 1 tablet (25 mg total) by mouth 2 (two) times daily. 180 tablet 0   No current facility-administered medications for this visit.    Allergies as of 08/19/2020 - Review Complete 05/24/2020  Allergen Reaction Noted  . Bee venom Anaphylaxis 07/04/2011  . Prochlorperazine edisylate Other (See Comments) 01/07/2009  . Cyclobenzaprine hcl Other (See Comments) 01/07/2009  . Aleve [naproxen sodium] Nausea And Vomiting 07/10/2012  . Carbamazepine    . Geodon [ziprasidone hcl] Nausea And Vomiting 08/31/2015  . Ibuprofen Nausea And Vomiting 07/10/2012  . Maxzide [hydrochlorothiazide w-triamterene]  02/19/2014  . Nsaids Other (See Comments) 06/21/2014  . Tylenol [acetaminophen] Nausea And Vomiting 07/10/2012  . Other Nausea And Vomiting, Rash, and Other (See Comments) 07/10/2012    Family History  Problem Relation Age of Onset  . Diabetes Mother   . Hypertension Mother   . Hypertension Father   . Arthritis Father   . Diabetes Father   . Colon cancer Neg Hx   . Gastric cancer Neg Hx   . Esophageal cancer Neg Hx     Social History   Socioeconomic History  . Marital status: Married    Spouse name: Not on file  . Number of children: 4  . Years of education: Not on file  . Highest education level: Not on file  Occupational History  . Occupation: Musician     Comment: [employer]  Tobacco Use  . Smoking status: Former Smoker    Packs/day: 0.10    Years: 15.00     Pack years: 1.50    Types: Cigarettes    Quit date: 09/15/2012    Years since quitting: 7.9  . Smokeless tobacco: Never Used  Vaping Use  . Vaping Use: Never used  Substance and Sexual Activity  . Alcohol use: No  . Drug use: No  . Sexual activity: Yes  Other Topics Concern  . Not on file  Social History Narrative  . Not on file   Social Determinants of Health   Financial Resource Strain:   . Difficulty of Paying Living Expenses: Not on file  Food Insecurity:   . Worried About Charity fundraiser in the Last Year: Not on file  . Ran Out of Food in the Last Year: Not on file  Transportation Needs:   . Lack of Transportation (Medical): Not on file  . Lack of Transportation (Non-Medical): Not on file  Physical Activity:   . Days of Exercise per Week: Not on file  . Minutes of Exercise per Session: Not on file  Stress:   . Feeling of Stress : Not on file  Social Connections:   . Frequency of Communication with Friends and Family: Not on file  . Frequency of Social Gatherings with Friends and Family: Not on file  . Attends Religious Services: Not on file  . Active Member of Clubs or Organizations: Not on file  . Attends Archivist Meetings: Not on file  . Marital Status: Not on file    Subjective:*** Review of Systems  Constitutional: Negative for chills, fever, malaise/fatigue and weight loss.  HENT: Negative for congestion and sore throat.   Respiratory: Negative for cough and shortness of breath.   Cardiovascular: Negative for chest pain and palpitations.  Gastrointestinal: Negative for abdominal pain, blood in stool, diarrhea, melena, nausea and vomiting.  Musculoskeletal: Negative for joint pain and myalgias.  Skin: Negative for rash.  Neurological: Negative for dizziness and weakness.  Endo/Heme/Allergies: Does not bruise/bleed easily.  Psychiatric/Behavioral: Negative for depression. The patient is not nervous/anxious.   All other systems reviewed  and are negative.    Objective: There were no vitals taken for this visit. Physical Exam Vitals and nursing note reviewed.  Constitutional:      General: He is not in acute distress.    Appearance: Normal appearance. He is not ill-appearing, toxic-appearing or diaphoretic.  HENT:     Head: Normocephalic and atraumatic.     Nose: No congestion or rhinorrhea.  Eyes:     General: No scleral icterus. Cardiovascular:     Rate and Rhythm: Normal rate and regular rhythm.     Heart sounds: Normal heart sounds.  Pulmonary:     Effort: Pulmonary effort is normal.     Breath sounds: Normal breath sounds.  Abdominal:     General: Bowel sounds are normal. There is no distension.     Palpations: Abdomen is soft. There is no hepatomegaly, splenomegaly or mass.     Tenderness: There is no abdominal tenderness. There is no guarding or rebound.     Hernia: No hernia is present.  Musculoskeletal:     Cervical back: Neck supple.  Skin:    General: Skin is warm and dry.     Coloration: Skin is not jaundiced.     Findings: No bruising or rash.  Neurological:     General: No focal deficit present.     Mental Status: He is alert and oriented to person, place, and time. Mental status is at baseline.  Psychiatric:        Mood and Affect: Mood normal.        Behavior: Behavior normal.        Thought Content: Thought content normal.      Assessment:  ***   Plan: ***    Thank you for allowing [place] to participate in the care of [place], DNP, AGNP-C Adult & Gerontological Nurse Practitioner [facility]   08/19/2020 8:58 AM   Disclaimer: This note was dictated with voice recognition software. Similar sounding words can inadvertently be transcribed and may not be corrected upon review.

## 2020-08-24 ENCOUNTER — Other Ambulatory Visit: Payer: Self-pay

## 2020-08-24 MED ORDER — AMLODIPINE BESYLATE 10 MG PO TABS: ORAL_TABLET | ORAL | 3 refills | Status: DC

## 2020-09-04 ENCOUNTER — Other Ambulatory Visit: Payer: Self-pay | Admitting: Family Medicine

## 2020-09-07 ENCOUNTER — Ambulatory Visit: Payer: BC Managed Care – PPO | Admitting: Family Medicine

## 2020-09-14 ENCOUNTER — Ambulatory Visit: Payer: BC Managed Care – PPO

## 2020-09-15 ENCOUNTER — Other Ambulatory Visit: Payer: Self-pay | Admitting: Family Medicine

## 2020-09-16 ENCOUNTER — Other Ambulatory Visit: Payer: Self-pay

## 2020-09-16 ENCOUNTER — Encounter: Payer: Self-pay | Admitting: Nurse Practitioner

## 2020-09-16 ENCOUNTER — Ambulatory Visit (INDEPENDENT_AMBULATORY_CARE_PROVIDER_SITE_OTHER): Payer: BC Managed Care – PPO | Admitting: Nurse Practitioner

## 2020-09-16 VITALS — BP 135/86 | HR 65 | Temp 98.5°F | Resp 20 | Ht 73.0 in | Wt 203.0 lb

## 2020-09-16 DIAGNOSIS — N184 Chronic kidney disease, stage 4 (severe): Secondary | ICD-10-CM | POA: Diagnosis not present

## 2020-09-16 DIAGNOSIS — G43709 Chronic migraine without aura, not intractable, without status migrainosus: Secondary | ICD-10-CM | POA: Diagnosis not present

## 2020-09-16 DIAGNOSIS — Z139 Encounter for screening, unspecified: Secondary | ICD-10-CM | POA: Insufficient documentation

## 2020-09-16 DIAGNOSIS — I150 Renovascular hypertension: Secondary | ICD-10-CM

## 2020-09-16 NOTE — Patient Instructions (Signed)
We will draw labs and complete the TB skin test on Monday.  We will read the TB skin test results and have a lab follow-up appointment on Thursday 09/25/19.

## 2020-09-16 NOTE — Assessment & Plan Note (Signed)
-  followed by [facility] -no recent labs; will check in a week

## 2020-09-16 NOTE — Assessment & Plan Note (Signed)
-  will set up for TB skin test on Monday

## 2020-09-16 NOTE — Progress Notes (Signed)
Established Patient Office Visit  Subjective:  Patient ID: [name], male    DOB: 03-17-1971  Age: 49 y.o. MRN: [id number]  CC:  Chief Complaint  Patient presents with  . Follow-up    Pt needs TB skin test for recent employment     HPI [name] presents for chronic care management.  He has not had recent labs and he needs TB skin test for a job application. He sees pain management for migraine management. He is seeing [facility] for his nephrology appointments.  Past Medical History:  Diagnosis Date  . Acute kidney injury ([place])   . Bilateral renal cysts 02/23/2014   Noted in 2013 initially   . Chronic back pain   . Depression, major, single episode, severe ([place]) 08/13/2018   pHQ 9 score of 24, not suicidal or homicidal, score of 8 in 09/2018, no medication or therapy as of this time  . Duodenitis with bleeding 08/2008   Ulcer - admitted to [facility]   . Essential hypertension, benign   . GERD (gastroesophageal reflux disease)   . Headache(784.0)   . Hyperlipidemia   . Hypertensive urgency 10/08/2019  . Malignant hypertension 10/09/2007       . Migraines   . Nonspecific abnormal electrocardiogram (ECG) (EKG) 08/30/2016  . Obesity, unspecified   . Sleep apnea   . Stress and adjustment reaction 11/04/2016    Past Surgical History:  Procedure Laterality Date  . 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   Procedure: 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.;  Surgeon: Jolyn Nap, MD;  Location: [facility];  Service: Orthopedics;  Laterality: Left;  . CHOLECYSTECTOMY N/A 10/03/2018   Procedure: LAPAROSCOPIC CHOLECYSTECTOMY;  Surgeon: Aviva Signs, MD;  Location: [facility];  Service: General;  Laterality: N/A;  . FINGER DEBRIDEMENT Left 09/25/2013   Thumb  . FOOT FRACTURE SURGERY Right     Family History  Problem Relation Age of Onset  . Diabetes Mother   . Hypertension Mother   . Hypertension Father   . Arthritis Father   .  Diabetes Father   . Colon cancer Neg Hx   . Gastric cancer Neg Hx   . Esophageal cancer Neg Hx     Social History   Socioeconomic History  . Marital status: Married    Spouse name: Not on file  . Number of children: 4  . Years of education: Not on file  . Highest education level: Not on file  Occupational History  . Occupation: Musician     Comment: [employer]  Tobacco Use  . Smoking status: Former Smoker    Packs/day: 0.10    Years: 15.00    Pack years: 1.50    Types: Cigarettes    Quit date: 09/15/2012    Years since quitting: 8.0  . Smokeless tobacco: Never Used  Vaping Use  . Vaping Use: Never used  Substance and Sexual Activity  . Alcohol use: No  . Drug use: No  . Sexual activity: Yes  Other Topics Concern  . Not on file  Social History Narrative  . Not on file   Social Determinants of Health   Financial Resource Strain: Not on file  Food Insecurity: Not on file  Transportation Needs: Not on file  Physical Activity: Not on file  Stress: Not on file  Social Connections: Not on file  Intimate Partner Violence: Not on file    Outpatient Medications Prior to Visit  Medication  Sig Dispense Refill  . amLODipine (NORVASC) 10 MG tablet Take one tablet by mouth at 8 am every morning and take one tablet by mouth at 8 pm every evening for blood pressure 60 tablet 3  . cloNIDine (CATAPRES - DOSED IN MG/24 HR) 0.3 mg/24hr patch APPLY 1 PATCH(0.3 MG) TOPICALLY TO THE SKIN 1 TIME A WEEK 12 patch 1  . esomeprazole (NEXIUM) 20 MG capsule Take 1 capsule (20 mg total) by mouth 2 (two) times daily before a meal. (Patient taking differently: Take 20 mg by mouth daily before breakfast.) 60 capsule 3  . hydrOXYzine (VISTARIL) 50 MG capsule TAKE 1 CAPSULE BY MOUTH TWICE DAILY AS NEEDED FOR ANXIETY 60 capsule 2  . metoprolol tartrate (LOPRESSOR) 25 MG tablet Take 1 tablet (25 mg total) by mouth 2 (two) times daily. 60 tablet 1  . ondansetron (ZOFRAN-ODT) 8 MG  disintegrating tablet Take 8 mg by mouth 3 (three) times daily as needed for nausea/vomiting.    . promethazine (PHENERGAN) 12.5 MG tablet Take 1 tablet (12.5 mg total) by mouth every 6 (six) hours as needed for nausea or vomiting. 30 tablet 0  . spironolactone (ALDACTONE) 25 MG tablet Take 1 tablet (25 mg total) by mouth 2 (two) times daily. 180 tablet 0   No facility-administered medications prior to visit.    Allergies  Allergen Reactions  . Bee Venom Anaphylaxis  . Prochlorperazine Edisylate Other (See Comments)    Reaction: Jittery,uncomfortable feeling  . Cyclobenzaprine Hcl Other (See Comments)    REACTION: feel anxious, nausea  . Aleve [Naproxen Sodium] Nausea And Vomiting    States that this medication also causes abdominal pain  . Carbamazepine     REACTION: unknown reaction  . Geodon [Ziprasidone Hcl] Nausea And Vomiting  . Ibuprofen Nausea And Vomiting    States that this medication also causes abdominal pain  . Maxzide [Hydrochlorothiazide W-Triamterene]     headache  . Nsaids Other (See Comments)    H/o severe anemia from UGIB from ulcer  . Tylenol [Acetaminophen] Nausea And Vomiting    Patient states that this medication also causes abdominal pain  . Other Nausea And Vomiting, Rash and Other (See Comments)    ALL MUSCLE RELAXANTS: states that they affect sciatic nerve, may cause nausea and vomiting, rash    ROS Review of Systems  Constitutional: Negative.   Respiratory: Negative.   Cardiovascular: Negative.   Musculoskeletal: Negative.   Psychiatric/Behavioral: Negative.       Objective:    Physical Exam Constitutional:      Appearance: Normal appearance.  Cardiovascular:     Rate and Rhythm: Normal rate and regular rhythm.     Pulses: Normal pulses.     Heart sounds: Normal heart sounds.  Pulmonary:     Effort: Pulmonary effort is normal.     Breath sounds: Normal breath sounds.  Neurological:     Mental Status: He is alert.  Psychiatric:         Mood and Affect: Mood normal.        Behavior: Behavior normal.        Thought Content: Thought content normal.        Judgment: Judgment normal.     BP 135/86   Pulse 65   Temp 98.5 F (36.9 C)   Resp 20   Ht 6' 1"  (1.854 m)   Wt 203 lb (92.1 kg)   SpO2 94%   BMI 26.78 kg/m  Wt Readings from Last 3 Encounters:  09/16/20 203 lb (92.1 kg)  05/21/20 189 lb 3.2 oz (85.8 kg)  05/18/20 191 lb 0.6 oz (86.7 kg)     Health Maintenance Due  Topic Date Due  . COVID-19 Vaccine (1) Never done  . COLONOSCOPY (Pts 45-55yr Insurance coverage will need to be confirmed)  Never done    There are no preventive care reminders to display for this patient.  Lab Results  Component Value Date   TSH 1.177 01/06/2018   Lab Results  Component Value Date   WBC 4.9 03/18/2020   HGB 10.1 (L) 03/18/2020   HCT 30.2 (L) 03/18/2020   MCV 95.9 03/18/2020   PLT 226 03/18/2020   Lab Results  Component Value Date   NA 140 03/18/2020   K 3.5 03/18/2020   CO2 24 03/18/2020   GLUCOSE 104 (H) 03/18/2020   BUN 37 (H) 03/18/2020   CREATININE 3.65 (H) 03/18/2020   BILITOT 1.0 12/11/2019   ALKPHOS 73 12/11/2019   AST 27 12/11/2019   ALT 16 12/11/2019   PROT 8.2 (H) 12/11/2019   ALBUMIN 5.0 12/11/2019   CALCIUM 8.7 (L) 03/18/2020   ANIONGAP 10 03/18/2020   Lab Results  Component Value Date   CHOL 123 11/08/2018   Lab Results  Component Value Date   HDL 47 11/08/2018   Lab Results  Component Value Date   LDLCALC 54 11/08/2018   Lab Results  Component Value Date   TRIG 134 11/08/2018   Lab Results  Component Value Date   CHOLHDL 2.6 11/08/2018   Lab Results  Component Value Date   HGBA1C 4.6 (L) 06/25/2019      Assessment & Plan:   Problem List Items Addressed This Visit      Cardiovascular and Mediastinum   Migraines (Chronic)    -was referred to neurology for this in 05/2020      Relevant Orders   CBC with Differential/Platelet   CMP14+EGFR   Lipid Panel With  LDL/HDL Ratio   Hypertension    -BP well controlled -related to renal disease -has malignant HTN in 2009 -currently taking amlodipine, clonidine, metoprolol, and spironolactone      Relevant Orders   CBC with Differential/Platelet   CMP14+EGFR   Lipid Panel With LDL/HDL Ratio     Genitourinary   CKD (chronic kidney disease) stage 4, GFR 15-29 ml/min [place]    [facility]; will check in a week      Relevant Orders   CBC with Differential/Platelet   CMP14+EGFR   Lipid Panel With LDL/HDL Ratio     Other   Screening due - Primary    -will set up for TB skin test on Monday         No orders of the defined types were placed in this encounter.   Follow-up: Return in about 5 days (around 09/21/2020) for TB skin test and fasting labs.    JNoreene Larsson NP

## 2020-09-16 NOTE — Assessment & Plan Note (Signed)
-  was referred to neurology for this in 05/2020

## 2020-09-16 NOTE — Assessment & Plan Note (Addendum)
-  BP well controlled -related to renal disease -has malignant HTN in 2009 -currently taking amlodipine, clonidine, metoprolol, and spironolactone

## 2020-09-21 ENCOUNTER — Ambulatory Visit: Payer: BC Managed Care – PPO

## 2020-11-12 ENCOUNTER — Encounter: Payer: Self-pay | Admitting: Internal Medicine

## 2020-11-12 ENCOUNTER — Other Ambulatory Visit: Payer: Self-pay

## 2020-11-12 ENCOUNTER — Ambulatory Visit (INDEPENDENT_AMBULATORY_CARE_PROVIDER_SITE_OTHER): Payer: BC Managed Care – PPO | Admitting: Internal Medicine

## 2020-11-12 VITALS — BP 143/89 | HR 83 | Temp 97.5°F | Ht 73.0 in | Wt 205.0 lb

## 2020-11-12 DIAGNOSIS — N184 Chronic kidney disease, stage 4 (severe): Secondary | ICD-10-CM | POA: Diagnosis not present

## 2020-11-12 DIAGNOSIS — I150 Renovascular hypertension: Secondary | ICD-10-CM | POA: Diagnosis not present

## 2020-11-12 NOTE — Patient Instructions (Signed)
Please get fasting blood tests done tomorrow.  Please continue to take medications as prescribed. Please continue to follow low salt diet and perform moderate exercise/walking as tolerated.  Please schedule a follow up visit with your Nephrologist.

## 2020-11-12 NOTE — Progress Notes (Signed)
Established Patient Office Visit  Subjective:  Patient ID: [name], male    DOB: September 03, 1971  Age: 50 y.o. MRN: [id number]  CC:  Chief Complaint  Patient presents with  . Hypertension    Follow up, needs insurance form filled out.    HPI [name] is a 50 year old male with PMH of HTN, migraine, CKD stage 4 and insomnia who presents for follow up of his chronic medical conditions and paperwork from his insurance.  His BP was elevated today, but has been stable overall at home and in previous visits. He is on Amlodipine, Metoprolol, Spironolactone and Clonidine patch. Takes medications regularly. Patient denies headache, dizziness, chest pain, dyspnea or palpitations.  CKD: His BMP have showed progressive CKD stage 4. Denies any urinary complaints currently. He needs to schedule follow up apt with Nephrology.   Past Medical History:  Diagnosis Date  . Acute kidney injury ([place])   . Bilateral renal cysts 02/23/2014   Noted in 2013 initially   . Chronic back pain   . Depression, major, single episode, severe ([place]) 08/13/2018   pHQ 9 score of 24, not suicidal or homicidal, score of 8 in 09/2018, no medication or therapy as of this time  . Duodenitis with bleeding 08/2008   Ulcer - admitted to [facility]   . Essential hypertension, benign   . GERD (gastroesophageal reflux disease)   . Headache(784.0)   . Hyperlipidemia   . Hypertensive urgency 10/08/2019  . Malignant hypertension 10/09/2007       . Migraines   . Nonspecific abnormal electrocardiogram (ECG) (EKG) 08/30/2016  . Obesity, unspecified   . Sleep apnea   . Stress and adjustment reaction 11/04/2016    Past Surgical History:  Procedure Laterality Date  . 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   Procedure: 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.;  Surgeon: Jolyn Nap, MD;  Location: [facility];  Service: Orthopedics;  Laterality: Left;  . CHOLECYSTECTOMY N/A 10/03/2018    Procedure: LAPAROSCOPIC CHOLECYSTECTOMY;  Surgeon: Aviva Signs, MD;  Location: [facility];  Service: General;  Laterality: N/A;  . FINGER DEBRIDEMENT Left 09/25/2013   Thumb  . FOOT FRACTURE SURGERY Right     Family History  Problem Relation Age of Onset  . Diabetes Mother   . Hypertension Mother   . Hypertension Father   . Arthritis Father   . Diabetes Father   . Colon cancer Neg Hx   . Gastric cancer Neg Hx   . Esophageal cancer Neg Hx     Social History   Socioeconomic History  . Marital status: Married    Spouse name: Not on file  . Number of children: 4  . Years of education: Not on file  . Highest education level: Not on file  Occupational History  . Occupation: Musician     Comment: [employer]  Tobacco Use  . Smoking status: Former Smoker    Packs/day: 0.10    Years: 15.00    Pack years: 1.50    Types: Cigarettes    Quit date: 09/15/2012    Years since quitting: 8.1  . Smokeless tobacco: Never Used  Vaping Use  . Vaping Use: Never used  Substance and Sexual Activity  . Alcohol use: No  . Drug use: No  . Sexual activity: Yes  Other Topics Concern  . Not on file  Social History Narrative  . Not on file   Social Determinants of Health  Financial Resource Strain: Not on file  Food Insecurity: Not on file  Transportation Needs: Not on file  Physical Activity: Not on file  Stress: Not on file  Social Connections: Not on file  Intimate Partner Violence: Not on file    Outpatient Medications Prior to Visit  Medication Sig Dispense Refill  . amLODipine (NORVASC) 10 MG tablet Take one tablet by mouth at 8 am every morning and take one tablet by mouth at 8 pm every evening for blood pressure 60 tablet 3  . cloNIDine (CATAPRES - DOSED IN MG/24 HR) 0.3 mg/24hr patch APPLY 1 PATCH(0.3 MG) TOPICALLY TO THE SKIN 1 TIME A WEEK 12 patch 1  . esomeprazole (NEXIUM) 20 MG capsule Take 1 capsule (20 mg total) by mouth 2 (two) times daily before a meal.  (Patient taking differently: Take 20 mg by mouth daily before breakfast.) 60 capsule 3  . hydrOXYzine (VISTARIL) 50 MG capsule TAKE 1 CAPSULE BY MOUTH TWICE DAILY AS NEEDED FOR ANXIETY 60 capsule 2  . metoprolol tartrate (LOPRESSOR) 25 MG tablet Take 1 tablet (25 mg total) by mouth 2 (two) times daily. 60 tablet 1  . ondansetron (ZOFRAN-ODT) 8 MG disintegrating tablet Take 8 mg by mouth 3 (three) times daily as needed for nausea/vomiting.    . promethazine (PHENERGAN) 12.5 MG tablet Take 1 tablet (12.5 mg total) by mouth every 6 (six) hours as needed for nausea or vomiting. 30 tablet 0  . spironolactone (ALDACTONE) 25 MG tablet Take 1 tablet (25 mg total) by mouth 2 (two) times daily. 180 tablet 0   No facility-administered medications prior to visit.    Allergies  Allergen Reactions  . Bee Venom Anaphylaxis  . Prochlorperazine Edisylate Other (See Comments)    Reaction: Jittery,uncomfortable feeling  . Cyclobenzaprine Hcl Other (See Comments)    REACTION: feel anxious, nausea  . Aleve [Naproxen Sodium] Nausea And Vomiting    States that this medication also causes abdominal pain  . Carbamazepine     REACTION: unknown reaction  . Geodon [Ziprasidone Hcl] Nausea And Vomiting  . Ibuprofen Nausea And Vomiting    States that this medication also causes abdominal pain  . Maxzide [Hydrochlorothiazide W-Triamterene]     headache  . Nsaids Other (See Comments)    H/o severe anemia from UGIB from ulcer  . Tylenol [Acetaminophen] Nausea And Vomiting    Patient states that this medication also causes abdominal pain  . Other Nausea And Vomiting, Rash and Other (See Comments)    ALL MUSCLE RELAXANTS: states that they affect sciatic nerve, may cause nausea and vomiting, rash    ROS Review of Systems  Constitutional: Negative for chills and fever.  HENT: Negative for congestion and sore throat.   Eyes: Negative for pain and discharge.  Respiratory: Negative for cough and shortness of breath.    Cardiovascular: Negative for chest pain and palpitations.  Gastrointestinal: Negative for constipation, diarrhea, nausea and vomiting.  Endocrine: Negative for polydipsia and polyuria.  Genitourinary: Negative for dysuria and hematuria.  Musculoskeletal: Negative for neck pain and neck stiffness.  Skin: Negative for rash.  Neurological: Negative for dizziness, weakness, numbness and headaches.  Psychiatric/Behavioral: Negative for agitation and behavioral problems.      Objective:    Physical Exam Vitals reviewed.  Constitutional:      General: He is not in acute distress.    Appearance: He is not diaphoretic.  HENT:     Head: Normocephalic and atraumatic.     Nose: Nose normal.  Mouth/Throat:     Mouth: Mucous membranes are moist.  Eyes:     General: No scleral icterus.    Extraocular Movements: Extraocular movements intact.     Pupils: Pupils are equal, round, and reactive to light.  Cardiovascular:     Rate and Rhythm: Normal rate and regular rhythm.     Pulses: Normal pulses.     Heart sounds: Normal heart sounds. No murmur heard.   Pulmonary:     Breath sounds: Normal breath sounds. No wheezing or rales.  Abdominal:     Palpations: Abdomen is soft.     Tenderness: There is no abdominal tenderness.  Musculoskeletal:     Cervical back: Neck supple. No tenderness.     Right lower leg: No edema.     Left lower leg: No edema.  Skin:    General: Skin is warm.     Findings: No rash.  Neurological:     General: No focal deficit present.     Mental Status: He is alert and oriented to person, place, and time.     Cranial Nerves: No cranial nerve deficit.     Sensory: No sensory deficit.  Psychiatric:        Mood and Affect: Mood normal.        Behavior: Behavior normal.     BP (!) 143/89 (BP Location: Right Arm, Patient Position: Sitting)   Pulse 83   Temp (!) 97.5 F (36.4 C) (Temporal)   Ht '6\' 1"'$  (1.854 m)   Wt 205 lb (93 kg)   SpO2 96%   BMI 27.05  kg/m  Wt Readings from Last 3 Encounters:  11/12/20 205 lb (93 kg)  09/16/20 203 lb (92.1 kg)  05/21/20 189 lb 3.2 oz (85.8 kg)     Health Maintenance Due  Topic Date Due  . COLONOSCOPY (Pts 45-60yr Insurance coverage will need to be confirmed)  Never done    There are no preventive care reminders to display for this patient.  Lab Results  Component Value Date   TSH 1.177 01/06/2018   Lab Results  Component Value Date   WBC 4.9 03/18/2020   HGB 10.1 (L) 03/18/2020   HCT 30.2 (L) 03/18/2020   MCV 95.9 03/18/2020   PLT 226 03/18/2020   Lab Results  Component Value Date   NA 140 03/18/2020   K 3.5 03/18/2020   CO2 24 03/18/2020   GLUCOSE 104 (H) 03/18/2020   BUN 37 (H) 03/18/2020   CREATININE 3.65 (H) 03/18/2020   BILITOT 1.0 12/11/2019   ALKPHOS 73 12/11/2019   AST 27 12/11/2019   ALT 16 12/11/2019   PROT 8.2 (H) 12/11/2019   ALBUMIN 5.0 12/11/2019   CALCIUM 8.7 (L) 03/18/2020   ANIONGAP 10 03/18/2020   Lab Results  Component Value Date   CHOL 123 11/08/2018   Lab Results  Component Value Date   HDL 47 11/08/2018   Lab Results  Component Value Date   LDLCALC 54 11/08/2018   Lab Results  Component Value Date   TRIG 134 11/08/2018   Lab Results  Component Value Date   CHOLHDL 2.6 11/08/2018   Lab Results  Component Value Date   HGBA1C 4.6 (L) 06/25/2019      Assessment & Plan:   Problem List Items Addressed This Visit      Cardiovascular and Mediastinum   Hypertension - Primary    BP Readings from Last 1 Encounters:  11/12/20 (!) 143/89   Elevated, but overall stable  at home with Amlodipine, Metoprolol, Spironolactone and Clonidine patch Counseled for compliance with the medications Advised DASH diet and moderate exercise/walking, at least 150 mins/week       Relevant Orders   Lipid panel   CBC     Genitourinary   CKD (chronic kidney disease) stage 4, GFR 15-29 ml/min (H[facility])    Progressive CKD Advised to keep close follow up  with Nephrology Check BMP      Relevant Orders   Basic Metabolic Panel (BMET)     Insurance paperwork to be submitted after lipid profile and BMP.  No orders of the defined types were placed in this encounter.   Follow-up: Return in about 4 weeks (around 12/10/2020) for Annual physical.    Lindell Spar, MD

## 2020-11-12 NOTE — Assessment & Plan Note (Signed)
BP Readings from Last 1 Encounters:  11/12/20 (!) 143/89   Elevated, but overall stable at home with Amlodipine, Metoprolol, Spironolactone and Clonidine patch Counseled for compliance with the medications Advised DASH diet and moderate exercise/walking, at least 150 mins/week

## 2020-11-12 NOTE — Assessment & Plan Note (Signed)
Progressive CKD Advised to keep close follow up with Nephrology Check BMP

## 2020-11-13 DIAGNOSIS — I150 Renovascular hypertension: Secondary | ICD-10-CM | POA: Diagnosis not present

## 2020-11-13 DIAGNOSIS — N184 Chronic kidney disease, stage 4 (severe): Secondary | ICD-10-CM | POA: Diagnosis not present

## 2020-11-14 LAB — BASIC METABOLIC PANEL
BUN/Creatinine Ratio: 9 (ref 9–20)
BUN: 29 mg/dL — ABNORMAL HIGH (ref 6–24)
CO2: 24 mmol/L (ref 20–29)
Calcium: 9.4 mg/dL (ref 8.7–10.2)
Chloride: 102 mmol/L (ref 96–106)
Creatinine, Ser: 3.35 mg/dL — ABNORMAL HIGH (ref 0.76–1.27)
GFR calc Af Amer: 23 mL/min/{1.73_m2} — ABNORMAL LOW (ref >59)
GFR calc non Af Amer: 20 mL/min/{1.73_m2} — ABNORMAL LOW (ref >59)
Glucose: 86 mg/dL (ref 65–99)
Potassium: 4.7 mmol/L (ref 3.5–5.2)
Sodium: 141 mmol/L (ref 134–144)

## 2020-11-14 LAB — LIPID PANEL
Chol/HDL Ratio: 2.6 ratio (ref 0.0–5.0)
Cholesterol, Total: 118 mg/dL (ref 100–199)
HDL: 45 mg/dL (ref >39)
LDL Chol Calc (NIH): 58 mg/dL (ref 0–99)
Triglycerides: 73 mg/dL (ref 0–149)
VLDL Cholesterol Cal: 15 mg/dL (ref 5–40)

## 2020-11-14 LAB — CBC
Hematocrit: 34.1 % — ABNORMAL LOW (ref 37.5–51.0)
Hemoglobin: 11.2 g/dL — ABNORMAL LOW (ref 13.0–17.7)
MCH: 32.7 pg (ref 26.6–33.0)
MCHC: 32.8 g/dL (ref 31.5–35.7)
MCV: 99 fL — ABNORMAL HIGH (ref 79–97)
Platelets: 258 10*3/uL (ref 150–450)
RBC: 3.43 x10E6/uL — ABNORMAL LOW (ref 4.14–5.80)
RDW: 11.8 % (ref 11.6–15.4)
WBC: 5.2 10*3/uL (ref 3.4–10.8)

## 2020-11-16 ENCOUNTER — Ambulatory Visit (INDEPENDENT_AMBULATORY_CARE_PROVIDER_SITE_OTHER): Payer: BC Managed Care – PPO

## 2020-11-16 ENCOUNTER — Other Ambulatory Visit: Payer: Self-pay

## 2020-11-16 DIAGNOSIS — Z111 Encounter for screening for respiratory tuberculosis: Secondary | ICD-10-CM | POA: Diagnosis not present

## 2020-11-16 NOTE — Progress Notes (Signed)
PPD placed and patient to return in 48-72 hrs for it to be read

## 2020-11-18 LAB — TB SKIN TEST
Induration: 0 mm
TB Skin Test: NEGATIVE

## 2020-12-03 ENCOUNTER — Encounter: Payer: BC Managed Care – PPO | Admitting: Family Medicine

## 2020-12-08 ENCOUNTER — Encounter: Payer: BC Managed Care – PPO | Admitting: Internal Medicine

## 2020-12-14 ENCOUNTER — Encounter: Payer: BC Managed Care – PPO | Admitting: Family Medicine

## 2020-12-17 ENCOUNTER — Telehealth: Payer: Self-pay

## 2020-12-17 NOTE — Telephone Encounter (Signed)
Patient spouse called patient needs copy of tb shot record from last visit that he did. To pick up in the office.

## 2020-12-17 NOTE — Telephone Encounter (Signed)
Printed and up front for pt pick up.

## 2021-01-19 ENCOUNTER — Other Ambulatory Visit: Payer: Self-pay | Admitting: Family Medicine

## 2021-02-03 ENCOUNTER — Other Ambulatory Visit: Payer: Self-pay | Admitting: Family Medicine

## 2021-03-16 ENCOUNTER — Other Ambulatory Visit: Payer: Self-pay | Admitting: Family Medicine

## 2021-06-07 ENCOUNTER — Other Ambulatory Visit: Payer: Self-pay | Admitting: Family Medicine

## 2021-06-08 ENCOUNTER — Emergency Department (HOSPITAL_COMMUNITY)
Admission: EM | Admit: 2021-06-08 | Discharge: 2021-06-09 | Disposition: A | Discharge status: Home / Self Care | Payer: BC Managed Care – PPO | Attending: Emergency Medicine | Admitting: Emergency Medicine

## 2021-06-08 ENCOUNTER — Encounter (HOSPITAL_COMMUNITY): Payer: Self-pay | Admitting: *Deleted

## 2021-06-08 ENCOUNTER — Other Ambulatory Visit: Payer: Self-pay

## 2021-06-08 DIAGNOSIS — R112 Nausea with vomiting, unspecified: Secondary | ICD-10-CM | POA: Diagnosis not present

## 2021-06-08 DIAGNOSIS — I10 Essential (primary) hypertension: Secondary | ICD-10-CM

## 2021-06-08 DIAGNOSIS — R1110 Vomiting, unspecified: Secondary | ICD-10-CM | POA: Diagnosis not present

## 2021-06-08 DIAGNOSIS — R030 Elevated blood-pressure reading, without diagnosis of hypertension: Secondary | ICD-10-CM | POA: Diagnosis not present

## 2021-06-08 DIAGNOSIS — I129 Hypertensive chronic kidney disease with stage 1 through stage 4 chronic kidney disease, or unspecified chronic kidney disease: Secondary | ICD-10-CM | POA: Insufficient documentation

## 2021-06-08 DIAGNOSIS — Z89012 Acquired absence of left thumb: Secondary | ICD-10-CM | POA: Diagnosis not present

## 2021-06-08 DIAGNOSIS — N184 Chronic kidney disease, stage 4 (severe): Secondary | ICD-10-CM | POA: Diagnosis not present

## 2021-06-08 DIAGNOSIS — Z79899 Other long term (current) drug therapy: Secondary | ICD-10-CM | POA: Insufficient documentation

## 2021-06-08 DIAGNOSIS — Z20822 Contact with and (suspected) exposure to covid-19: Secondary | ICD-10-CM | POA: Diagnosis not present

## 2021-06-08 DIAGNOSIS — Z87891 Personal history of nicotine dependence: Secondary | ICD-10-CM | POA: Insufficient documentation

## 2021-06-08 LAB — CBC
HCT: 34.1 % — ABNORMAL LOW (ref 39.0–52.0)
Hemoglobin: 11.7 g/dL — ABNORMAL LOW (ref 13.0–17.0)
MCH: 33.9 pg (ref 26.0–34.0)
MCHC: 34.3 g/dL (ref 30.0–36.0)
MCV: 98.8 fL (ref 80.0–100.0)
Platelets: 266 10*3/uL (ref 150–400)
RBC: 3.45 MIL/uL — ABNORMAL LOW (ref 4.22–5.81)
RDW: 10.8 % — ABNORMAL LOW (ref 11.5–15.5)
WBC: 7.4 10*3/uL (ref 4.0–10.5)
nRBC: 0 % (ref 0.0–0.2)

## 2021-06-08 LAB — COMPREHENSIVE METABOLIC PANEL
ALT: 11 U/L (ref 0–44)
AST: 19 U/L (ref 15–41)
Albumin: 4.7 g/dL (ref 3.5–5.0)
Alkaline Phosphatase: 69 U/L (ref 38–126)
Anion gap: 7 (ref 5–15)
BUN: 31 mg/dL — ABNORMAL HIGH (ref 6–20)
CO2: 24 mmol/L (ref 22–32)
Calcium: 8.8 mg/dL — ABNORMAL LOW (ref 8.9–10.3)
Chloride: 105 mmol/L (ref 98–111)
Creatinine, Ser: 3.17 mg/dL — ABNORMAL HIGH (ref 0.61–1.24)
GFR, Estimated: 23 mL/min — ABNORMAL LOW (ref >60)
Glucose, Bld: 119 mg/dL — ABNORMAL HIGH (ref 70–99)
Potassium: 4.2 mmol/L (ref 3.5–5.1)
Sodium: 136 mmol/L (ref 135–145)
Total Bilirubin: 1.2 mg/dL (ref 0.3–1.2)
Total Protein: 7.6 g/dL (ref 6.5–8.1)

## 2021-06-08 MED ORDER — SODIUM CHLORIDE 0.9 % IV BOLUS
1000.0000 mL | Freq: Once | INTRAVENOUS | Status: AC
  Administered 2021-06-08: 1000 mL via INTRAVENOUS

## 2021-06-08 MED ORDER — ONDANSETRON 4 MG PO TBDP
4.0000 mg | ORAL_TABLET | Freq: Once | ORAL | Status: AC
  Administered 2021-06-08: 4 mg via ORAL
  Filled 2021-06-08: qty 1

## 2021-06-08 NOTE — ED Provider Notes (Signed)
[facility] EMERGENCY DEPARTMENT Provider Note   CSN: [id number] Arrival date & time: 06/08/21  1823     History Chief Complaint  Patient presents with   Vomiting   Chills    [name] is a 50 y.o. male.  Pt reports he has a history of vomiting when his blood pressure is high.  Pt reports multiple episodes of vomiting today.  Pt has a history of hypertension and renal disease  The history is provided by the patient. No language interpreter was used.  Emesis Severity:  Severe Duration:  1 day Timing:  Constant Number of daily episodes:  Multiple Quality:  Unable to specify Progression:  Worsening Chronicity:  New Relieved by:  Nothing Worsened by:  Nothing Ineffective treatments:  None tried Associated symptoms: no abdominal pain and no diarrhea   Risk factors: prior abdominal surgery   Risk factors: no sick contacts       Past Medical History:  Diagnosis Date   Acute kidney injury ([place])    Bilateral renal cysts 02/23/2014   Noted in 2013 initially    Chronic back pain    Depression, major, single episode, severe ([place]) 08/13/2018   pHQ 9 score of 24, not suicidal or homicidal, score of 8 in 09/2018, no medication or therapy as of this time   Duodenitis with bleeding 08/2008   Ulcer - admitted to [place] hypertension, benign    GERD (gastroesophageal reflux disease)    Headache(784.0)    Hyperlipidemia    Hypertensive urgency 10/08/2019   Malignant hypertension 10/09/2007        Migraines    Nonspecific abnormal electrocardiogram (ECG) (EKG) 08/30/2016   Obesity, unspecified    Sleep apnea    Stress and adjustment reaction 11/04/2016    Patient Active Problem List   Diagnosis Date Noted   Screening due 09/16/2020   Nausea with vomiting 11/27/2019   Unintentional weight loss 11/27/2019   Dysphagia 11/27/2019   Hypertension 06/25/2019   Atrial premature depolarization    Cholelithiasis 07/30/2018   GERD (gastroesophageal reflux  disease) 01/06/2018   Hypertensive cardiomyopathy ([place]) 05/21/2017   Sleep disorder 11/05/2014   CKD (chronic kidney disease) stage 4, GFR 15-29 ml/min ([place]) 02/23/2014   Fatigue due to sleep pattern disturbance 10/20/2008   Migraines 10/09/2007    Past Surgical History:  Procedure Laterality Date   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   Procedure: 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.;  Surgeon: Jolyn Nap, MD;  Location: [facility];  Service: Orthopedics;  Laterality: Left;   CHOLECYSTECTOMY N/A 10/03/2018   Procedure: LAPAROSCOPIC CHOLECYSTECTOMY;  Surgeon: Aviva Signs, MD;  Location: [facility];  Service: General;  Laterality: N/A;   FINGER DEBRIDEMENT Left 09/25/2013   Thumb   FOOT FRACTURE SURGERY Right        Family History  Problem Relation Age of Onset   Diabetes Mother    Hypertension Mother    Hypertension Father    Arthritis Father    Diabetes Father    Colon cancer Neg Hx    Gastric cancer Neg Hx    Esophageal cancer Neg Hx     Social History   Tobacco Use   Smoking status: Former    Packs/day: 0.10    Years: 15.00    Pack years: 1.50    Types: Cigarettes    Quit date: 09/15/2012    Years since quitting: 8.7   Smokeless tobacco: Never  Vaping  Use   Vaping Use: Never used  Substance Use Topics   Alcohol use: No   Drug use: No    Home Medications Prior to Admission medications   Medication Sig Start Date End Date Taking? Authorizing Provider  amLODipine (NORVASC) 10 MG tablet TAKE 1 TABLET BY MOUTH IN THE MORNING AND TAKE 1 TABLET BY MOUTH AT 8 PM FOR BLOOD PRESSURE 06/07/21   Fayrene Helper, MD  cloNIDine (CATAPRES - DOSED IN MG/24 HR) 0.3 mg/24hr patch APPLY 1 PATCH(0.3 MG) TOPICALLY TO THE SKIN 1 TIME A WEEK 02/04/21   Fayrene Helper, MD  esomeprazole (NEXIUM) 20 MG capsule Take 1 capsule (20 mg total) by mouth 2 (two) times daily before a meal. Patient taking differently: Take 20 mg by mouth daily before breakfast.  11/27/19   Carlis Stable, NP  hydrOXYzine (VISTARIL) 50 MG capsule TAKE 1 CAPSULE BY MOUTH TWICE A DAY AS NEEDED FOR ANXIETY 03/16/21   Fayrene Helper, MD  metoprolol tartrate (LOPRESSOR) 25 MG tablet Take 1 tablet (25 mg total) by mouth 2 (two) times daily. 10/09/19   Johnson, Clanford L, MD  ondansetron (ZOFRAN-ODT) 8 MG disintegrating tablet Take 8 mg by mouth 3 (three) times daily as needed for nausea/vomiting. 10/28/19   [provider]  promethazine (PHENERGAN) 12.5 MG tablet Take 1 tablet (12.5 mg total) by mouth every 6 (six) hours as needed for nausea or vomiting. 11/27/19   Carlis Stable, NP  spironolactone (ALDACTONE) 25 MG tablet Take 1 tablet (25 mg total) by mouth 2 (two) times daily. 05/18/20   Fayrene Helper, MD    Allergies    Bee venom, Prochlorperazine edisylate, Cyclobenzaprine hcl, Aleve [naproxen sodium], Carbamazepine, Geodon [ziprasidone hcl], Ibuprofen, Maxzide [hydrochlorothiazide w-triamterene], Nsaids, Tylenol [acetaminophen], and Other  Review of Systems   Review of Systems  Gastrointestinal:  Positive for vomiting. Negative for abdominal pain and diarrhea.   Physical Exam Updated Vital Signs BP (!) 172/122   Pulse 64   Temp 99.3 F (37.4 C) (Oral)   Resp (!) 21   SpO2 96%   Physical Exam Vitals and nursing note reviewed.  Constitutional:      Appearance: He is well-developed.  HENT:     Head: Normocephalic.     Nose: Nose normal.     Mouth/Throat:     Mouth: Mucous membranes are moist.  Cardiovascular:     Rate and Rhythm: Normal rate.  Pulmonary:     Effort: Pulmonary effort is normal.  Abdominal:     General: There is no distension.  Musculoskeletal:        General: Normal range of motion.     Cervical back: Normal range of motion.  Skin:    General: Skin is warm.  Neurological:     Mental Status: He is alert and oriented to person, place, and time.  a  ED Results / Procedures / Treatments   Labs (all labs ordered are listed,  but only abnormal results are displayed) Labs Reviewed  CBC - Abnormal; Notable for the following components:      Result Value   RBC 3.45 (*)    Hemoglobin 11.7 (*)    HCT 34.1 (*)    RDW 10.8 (*)    All other components within normal limits  RESP PANEL BY RT-PCR (FLU A&B, COVID) ARPGX2  COMPREHENSIVE METABOLIC PANEL    EKG None  Radiology No results found.  Procedures Procedures   Medications Ordered in ED Medications  ondansetron (  ZOFRAN-ODT) disintegrating tablet 4 mg (has no administration in time range)    ED Course  I have reviewed the triage vital signs and the nursing notes.  Pertinent labs & imaging results that were available during my care of the patient were reviewed by me and considered in my medical decision making (see chart for details).    MDM Rules/Calculators/A&P                           MDM:  Pt gagging during exam. No active vomiting. Labs ordered.  Iv fluids and odt zofran  Pt's care turned over to Dr. Betsey Holiday Final Clinical Impression(s) / ED Diagnoses Final diagnoses:  Vomiting, intractability of vomiting not specified, presence of nausea not specified, unspecified vomiting type  Hypertension, unspecified type    Rx / DC Orders ED Discharge Orders     None        Sidney Ace 06/08/21 2307    Davonna Belling, MD 06/08/21 2356

## 2021-06-08 NOTE — ED Triage Notes (Signed)
Vomiting with chills onset this am

## 2021-06-09 LAB — RESP PANEL BY RT-PCR (FLU A&B, COVID) ARPGX2
Influenza A by PCR: NEGATIVE
Influenza B by PCR: NEGATIVE
SARS Coronavirus 2 by RT PCR: NEGATIVE

## 2021-06-09 MED ORDER — ONDANSETRON HCL 4 MG PO TABS: 4.0000 mg | ORAL_TABLET | Freq: Four times a day (QID) | ORAL | 0 refills | Status: DC | PRN

## 2021-06-09 NOTE — ED Provider Notes (Signed)
Patient signed out to me to follow-up on laboratory results.  Patient seen earlier with nausea and vomiting.  Patient indicates that he has had this happen before with elevated blood pressure.  Blood pressures were fairly elevated at arrival.  These have improved, last blood pressure 144/95.  Lab work unremarkable.  COVID testing negative.  Upon recheck, patient is resting comfortably.  No abdominal pain.  No further nausea or vomiting.  Abdominal exam is benign, nontender.  Patient's only complaint is a headache at this time.  We will treat empirically.  He has a normal neurologic exam, no concern for intracranial abnormality.   Orpah Greek, MD 06/09/21 [phone]

## 2021-06-10 ENCOUNTER — Other Ambulatory Visit: Payer: Self-pay

## 2021-06-10 ENCOUNTER — Ambulatory Visit (INDEPENDENT_AMBULATORY_CARE_PROVIDER_SITE_OTHER): Payer: BC Managed Care – PPO | Admitting: Family Medicine

## 2021-06-10 ENCOUNTER — Encounter: Payer: Self-pay | Admitting: Family Medicine

## 2021-06-10 ENCOUNTER — Encounter: Payer: Self-pay | Admitting: Internal Medicine

## 2021-06-10 VITALS — BP 134/82 | HR 66 | Temp 98.1°F | Resp 20 | Ht 72.0 in | Wt 178.0 lb

## 2021-06-10 DIAGNOSIS — E559 Vitamin D deficiency, unspecified: Secondary | ICD-10-CM

## 2021-06-10 DIAGNOSIS — R110 Nausea: Secondary | ICD-10-CM

## 2021-06-10 DIAGNOSIS — I150 Renovascular hypertension: Secondary | ICD-10-CM

## 2021-06-10 DIAGNOSIS — Z23 Encounter for immunization: Secondary | ICD-10-CM

## 2021-06-10 DIAGNOSIS — R634 Abnormal weight loss: Secondary | ICD-10-CM

## 2021-06-10 DIAGNOSIS — K219 Gastro-esophageal reflux disease without esophagitis: Secondary | ICD-10-CM

## 2021-06-10 DIAGNOSIS — N184 Chronic kidney disease, stage 4 (severe): Secondary | ICD-10-CM | POA: Diagnosis not present

## 2021-06-10 DIAGNOSIS — G4489 Other headache syndrome: Secondary | ICD-10-CM | POA: Diagnosis not present

## 2021-06-10 DIAGNOSIS — Z139 Encounter for screening, unspecified: Secondary | ICD-10-CM

## 2021-06-10 DIAGNOSIS — Z125 Encounter for screening for malignant neoplasm of prostate: Secondary | ICD-10-CM

## 2021-06-10 DIAGNOSIS — Z1211 Encounter for screening for malignant neoplasm of colon: Secondary | ICD-10-CM

## 2021-06-10 MED ORDER — ONDANSETRON HCL 4 MG/2ML IJ SOLN
4.0000 mg | Freq: Once | INTRAMUSCULAR | Status: AC
  Administered 2021-06-10: 4 mg via INTRAMUSCULAR

## 2021-06-10 MED ORDER — METHYLPREDNISOLONE ACETATE 80 MG/ML IJ SUSP
80.0000 mg | Freq: Once | INTRAMUSCULAR | Status: AC
  Administered 2021-06-10: 80 mg via INTRAMUSCULAR

## 2021-06-10 NOTE — Assessment & Plan Note (Addendum)
Needs colonoscopy, this is past due, 20 pound weight losss in 12 months, significant nausea and poor appetite

## 2021-06-10 NOTE — Assessment & Plan Note (Signed)
Controlled, no change in medication  

## 2021-06-10 NOTE — Patient Instructions (Addendum)
Annual exam in 6 to 8 weeks call if you need me sooner  Flu vaccine today  Labs today HBA1C, lipid panel TSH vitamin D, PSA.  You are referred to neurology for headache management.  You are referred to gastroenterology for colonoscopy and evaluation of unintentional weight loss.  Depo-Medrol 80 mg IM in the office today for your headache and Zofran 4 mg IM for nausea.  Blood pressure is controlled continue current medication.  I am concerned about your weight loss I will  refer you to nephrology so you can continue to be followed for your chronic kidney disease.  It is very important that you keep all of your appointments.  Thanks for choosing [facility], we consider it a privelige to serve you.

## 2021-06-10 NOTE — Assessment & Plan Note (Addendum)
depo medrol administered IM in the office and zofran, neds to continue with Neurology for management

## 2021-06-14 ENCOUNTER — Encounter: Payer: Self-pay | Admitting: Family Medicine

## 2021-06-14 NOTE — Assessment & Plan Note (Signed)
Controlled, no change in medication  

## 2021-06-14 NOTE — Assessment & Plan Note (Addendum)
Zofran administered in office,uncontrolled

## 2021-06-14 NOTE — Progress Notes (Signed)
   [name]     MRN: [id number]      DOB: 06/02/1971   HPI [name] is here for follow up and re-evaluation of chronic medical conditions, medication management and review of any available recent lab and radiology data.  C/o uncontrolled headache, nausea and vomitinmg Needs to establish with Neurology, missed last appointment Denies anyf ever , chills or neck pain Has not been seeing nephrology Has significant unintentional weight loss  ROS Denies recent fever or chills. Denies sinus pressure, nasal congestion, ear pain or sore throat. Denies chest congestion, productive cough or wheezing. Denies chest pains, palpitations and leg swelling Denies abdominal pain, nausea, vomiting,diarrhea or constipation.   Denies dysuria, frequency, hesitancy or incontinence. Denies joint pain, swelling and limitation in mobility. Denies headaches, seizures, numbness, or tingling. Denies depression, anxiety or insomnia. Denies skin break down or rash.   PE  BP 134/82   Pulse 66   Temp 98.1 F (36.7 C)   Resp 20   Ht 6' (1.829 m)   Wt 178 lb (80.7 kg)   SpO2 98%   BMI 24.14 kg/m   Patient ill appearing and in no cardiopulmonary distress.  HEENT: No facial asymmetry, EOMI,     Neck supple .  Chest: Clear to auscultation bilaterally.  CVS: S1, S2 no murmurs, no S3.Regular rate.  ABD: Soft non tender.   Ext: No edema  MS: Adequate ROM spine, shoulders, hips and knees.  Skin: Intact, no ulcerations or rash noted.  Psych: Good eye contact, not anxious or depressed appearing.  CNS: CN 2-12 intact, power,  normal throughout.no focal deficits noted.   Assessment & Plan  Hypertension Controlled, no change in medication   Unintentional weight loss Needs colonoscopy, this is past due  Headache  depo medrol administered IM in the office and zofran, neds to continue with Neurology for management  Nausea Zofran administered in office  GERD (gastroesophageal reflux  disease) Controlled, no change in medication   CKD (chronic kidney disease) stage 4, GFR 15-29 ml/min ([facility]) Needs to f/u with nephrology

## 2021-06-14 NOTE — Assessment & Plan Note (Addendum)
Needs to f/u with nephrology, has not seen Nephrology in over 6 months, ill appearing with significant weight loss

## 2021-06-28 ENCOUNTER — Telehealth: Payer: Self-pay | Admitting: Family Medicine

## 2021-06-28 DIAGNOSIS — R809 Proteinuria, unspecified: Secondary | ICD-10-CM | POA: Diagnosis not present

## 2021-06-28 DIAGNOSIS — I129 Hypertensive chronic kidney disease with stage 1 through stage 4 chronic kidney disease, or unspecified chronic kidney disease: Secondary | ICD-10-CM | POA: Diagnosis not present

## 2021-06-28 DIAGNOSIS — N184 Chronic kidney disease, stage 4 (severe): Secondary | ICD-10-CM | POA: Diagnosis not present

## 2021-06-28 DIAGNOSIS — E559 Vitamin D deficiency, unspecified: Secondary | ICD-10-CM | POA: Diagnosis not present

## 2021-06-28 NOTE — Telephone Encounter (Signed)
Called in on behalf of husband about Clonidine prescription, wants to see if possible to get 90 day supply because its easier with insurance

## 2021-06-29 ENCOUNTER — Other Ambulatory Visit (HOSPITAL_BASED_OUTPATIENT_CLINIC_OR_DEPARTMENT_OTHER): Payer: Self-pay | Admitting: Nephrology

## 2021-06-29 ENCOUNTER — Other Ambulatory Visit (HOSPITAL_COMMUNITY): Payer: Self-pay | Admitting: Nephrology

## 2021-06-29 ENCOUNTER — Other Ambulatory Visit: Payer: Self-pay

## 2021-06-29 DIAGNOSIS — I129 Hypertensive chronic kidney disease with stage 1 through stage 4 chronic kidney disease, or unspecified chronic kidney disease: Secondary | ICD-10-CM

## 2021-06-29 DIAGNOSIS — R809 Proteinuria, unspecified: Secondary | ICD-10-CM

## 2021-06-29 DIAGNOSIS — E559 Vitamin D deficiency, unspecified: Secondary | ICD-10-CM

## 2021-06-29 DIAGNOSIS — D631 Anemia in chronic kidney disease: Secondary | ICD-10-CM

## 2021-06-29 DIAGNOSIS — N281 Cyst of kidney, acquired: Secondary | ICD-10-CM

## 2021-06-29 DIAGNOSIS — N184 Chronic kidney disease, stage 4 (severe): Secondary | ICD-10-CM

## 2021-06-29 DIAGNOSIS — I5032 Chronic diastolic (congestive) heart failure: Secondary | ICD-10-CM

## 2021-06-29 MED ORDER — CLONIDINE 0.3 MG/24HR TD PTWK: MEDICATED_PATCH | TRANSDERMAL | 1 refills | Status: DC

## 2021-06-29 NOTE — Telephone Encounter (Signed)
90 day refill sent. 

## 2021-07-09 ENCOUNTER — Other Ambulatory Visit: Payer: Self-pay

## 2021-07-09 ENCOUNTER — Ambulatory Visit (HOSPITAL_COMMUNITY)
Admission: RE | Admit: 2021-07-09 | Discharge: 2021-07-09 | Disposition: A | Discharge status: Home / Self Care | Payer: BC Managed Care – PPO | Source: Ambulatory Visit | Attending: Nephrology | Admitting: Nephrology

## 2021-07-09 DIAGNOSIS — N281 Cyst of kidney, acquired: Secondary | ICD-10-CM

## 2021-07-09 DIAGNOSIS — I129 Hypertensive chronic kidney disease with stage 1 through stage 4 chronic kidney disease, or unspecified chronic kidney disease: Secondary | ICD-10-CM | POA: Diagnosis not present

## 2021-07-09 DIAGNOSIS — I5032 Chronic diastolic (congestive) heart failure: Secondary | ICD-10-CM

## 2021-07-09 DIAGNOSIS — N184 Chronic kidney disease, stage 4 (severe): Secondary | ICD-10-CM

## 2021-07-09 DIAGNOSIS — D631 Anemia in chronic kidney disease: Secondary | ICD-10-CM | POA: Diagnosis not present

## 2021-07-09 DIAGNOSIS — R809 Proteinuria, unspecified: Secondary | ICD-10-CM | POA: Diagnosis not present

## 2021-07-09 DIAGNOSIS — E559 Vitamin D deficiency, unspecified: Secondary | ICD-10-CM | POA: Diagnosis not present

## 2021-07-12 ENCOUNTER — Emergency Department (HOSPITAL_COMMUNITY)
Admission: EM | Admit: 2021-07-12 | Discharge: 2021-07-13 | Disposition: A | Discharge status: Home / Self Care | Payer: BC Managed Care – PPO | Attending: Emergency Medicine | Admitting: Emergency Medicine

## 2021-07-12 ENCOUNTER — Emergency Department (HOSPITAL_COMMUNITY): Payer: BC Managed Care – PPO

## 2021-07-12 ENCOUNTER — Other Ambulatory Visit: Payer: Self-pay

## 2021-07-12 ENCOUNTER — Encounter (HOSPITAL_COMMUNITY): Payer: Self-pay | Admitting: *Deleted

## 2021-07-12 DIAGNOSIS — R1110 Vomiting, unspecified: Secondary | ICD-10-CM | POA: Insufficient documentation

## 2021-07-12 DIAGNOSIS — Z5321 Procedure and treatment not carried out due to patient leaving prior to being seen by health care provider: Secondary | ICD-10-CM | POA: Diagnosis not present

## 2021-07-12 DIAGNOSIS — J9811 Atelectasis: Secondary | ICD-10-CM | POA: Diagnosis not present

## 2021-07-12 DIAGNOSIS — R079 Chest pain, unspecified: Secondary | ICD-10-CM | POA: Insufficient documentation

## 2021-07-12 LAB — BASIC METABOLIC PANEL
Anion gap: 10 (ref 5–15)
BUN: 34 mg/dL — ABNORMAL HIGH (ref 6–20)
CO2: 20 mmol/L — ABNORMAL LOW (ref 22–32)
Calcium: 9.2 mg/dL (ref 8.9–10.3)
Chloride: 109 mmol/L (ref 98–111)
Creatinine, Ser: 3.18 mg/dL — ABNORMAL HIGH (ref 0.61–1.24)
GFR, Estimated: 23 mL/min — ABNORMAL LOW (ref >60)
Glucose, Bld: 113 mg/dL — ABNORMAL HIGH (ref 70–99)
Potassium: 4 mmol/L (ref 3.5–5.1)
Sodium: 139 mmol/L (ref 135–145)

## 2021-07-12 LAB — CBC WITH DIFFERENTIAL/PLATELET
Abs Immature Granulocytes: 0.01 10*3/uL (ref 0.00–0.07)
Basophils Absolute: 0 10*3/uL (ref 0.0–0.1)
Basophils Relative: 0 %
Eosinophils Absolute: 0 10*3/uL (ref 0.0–0.5)
Eosinophils Relative: 0 %
HCT: 32.9 % — ABNORMAL LOW (ref 39.0–52.0)
Hemoglobin: 11.5 g/dL — ABNORMAL LOW (ref 13.0–17.0)
Immature Granulocytes: 0 %
Lymphocytes Relative: 11 %
Lymphs Abs: 0.6 10*3/uL — ABNORMAL LOW (ref 0.7–4.0)
MCH: 34.3 pg — ABNORMAL HIGH (ref 26.0–34.0)
MCHC: 35 g/dL (ref 30.0–36.0)
MCV: 98.2 fL (ref 80.0–100.0)
Monocytes Absolute: 0.2 10*3/uL (ref 0.1–1.0)
Monocytes Relative: 3 %
Neutro Abs: 4.6 10*3/uL (ref 1.7–7.7)
Neutrophils Relative %: 86 %
Platelets: 267 10*3/uL (ref 150–400)
RBC: 3.35 MIL/uL — ABNORMAL LOW (ref 4.22–5.81)
RDW: 10.9 % — ABNORMAL LOW (ref 11.5–15.5)
WBC: 5.3 10*3/uL (ref 4.0–10.5)
nRBC: 0 % (ref 0.0–0.2)

## 2021-07-12 LAB — TROPONIN I (HIGH SENSITIVITY): Troponin I (High Sensitivity): 11 ng/L (ref <18)

## 2021-07-12 NOTE — ED Triage Notes (Signed)
Pt with left CP for past 30 min to an hour.  Pt has been vomiting frequently all day.  Denies any SOB.

## 2021-07-15 ENCOUNTER — Ambulatory Visit: Payer: BC Managed Care – PPO | Admitting: Family Medicine

## 2021-08-06 ENCOUNTER — Encounter: Payer: BC Managed Care – PPO | Admitting: Family Medicine

## 2021-09-06 ENCOUNTER — Institutional Professional Consult (permissible substitution): Payer: BC Managed Care – PPO | Admitting: Pulmonary Disease

## 2021-09-28 ENCOUNTER — Other Ambulatory Visit: Payer: Self-pay | Admitting: Family Medicine

## 2021-10-26 NOTE — Progress Notes (Deleted)
Primary Care Physician:  Fayrene Helper, MD  Primary Gastroenterologist:  Garfield Cornea, MD   No chief complaint on file.   HPI:  [name] is a 51 y.o. male here for further evaluation of ***. Last seen 05/2020 for GERD, N/V, weight loss. He was scheduled for EGD/colonoscopy at that time but he did not complete.   H/O large duodenal ulcer noted in 2009, concerning for underlying malignant process. Bx showed ulceration associated with active inflammation. There was villous blunting, focal gastric metaplasia, likely peptic in nature. No malignancy seen. He was followed at [facility]. EUS in 2010 showed 3cm duodenal ulcer. Repeat biopsies again with no malignancy. Repeat EGD while inpatient in 2010 showed persistent duodenal ulcer. Patient was h.pylori positive. Further details unavailable.   Current Outpatient Medications  Medication Sig Dispense Refill   amLODipine (NORVASC) 10 MG tablet TAKE 1 TABLET BY MOUTH IN THE MORNING AND TAKE 1 TABLET BY MOUTH AT 8 PM FOR BLOOD PRESSURE 180 tablet 1   cloNIDine (CATAPRES - DOSED IN MG/24 HR) 0.3 mg/24hr patch APPLY 1 PATCH ON THE SKIN ONCE A WEEK 12 patch 1   esomeprazole (NEXIUM) 20 MG capsule Take 1 capsule (20 mg total) by mouth 2 (two) times daily before a meal. (Patient taking differently: Take 20 mg by mouth daily before breakfast.) 60 capsule 3   hydrOXYzine (VISTARIL) 50 MG capsule TAKE 1 CAPSULE BY MOUTH TWICE A DAY AS NEEDED FOR ANXIETY 60 capsule 2   metoprolol tartrate (LOPRESSOR) 25 MG tablet Take 1 tablet (25 mg total) by mouth 2 (two) times daily. 60 tablet 1   ondansetron (ZOFRAN) 4 MG tablet Take 1 tablet (4 mg total) by mouth every 6 (six) hours as needed for nausea or vomiting. 20 tablet 0   promethazine (PHENERGAN) 12.5 MG tablet Take 1 tablet (12.5 mg total) by mouth every 6 (six) hours as needed for nausea or vomiting. 30 tablet 0   spironolactone (ALDACTONE) 25 MG tablet Take 1 tablet (25 mg total) by mouth 2 (two) times  daily. 180 tablet 0   No current facility-administered medications for this visit.    Allergies as of 10/27/2021 - Review Complete 07/12/2021  Allergen Reaction Noted   Bee venom Anaphylaxis 07/04/2011   Prochlorperazine edisylate Other (See Comments) 01/07/2009   Cyclobenzaprine hcl Other (See Comments) 01/07/2009   Aleve [naproxen sodium] Nausea And Vomiting 07/10/2012   Carbamazepine     Geodon [ziprasidone hcl] Nausea And Vomiting 08/31/2015   Ibuprofen Nausea And Vomiting 07/10/2012   Maxzide [hydrochlorothiazide w-triamterene]  02/19/2014   Nsaids Other (See Comments) 06/21/2014   Tylenol [acetaminophen] Nausea And Vomiting 07/10/2012   Other Nausea And Vomiting, Rash, and Other (See Comments) 07/10/2012    Past Medical History:  Diagnosis Date   Acute kidney injury ([place])    Bilateral renal cysts 02/23/2014   Noted in 2013 initially    Chronic back pain    Depression, major, single episode, severe ([place]) 08/13/2018   pHQ 9 score of 24, not suicidal or homicidal, score of 8 in 09/2018, no medication or therapy as of this time   Duodenitis with bleeding 08/2008   Ulcer - admitted to [place] hypertension, benign    GERD (gastroesophageal reflux disease)    Headache(784.0)    Hyperlipidemia    Hypertensive urgency 10/08/2019   Malignant hypertension 10/09/2007        Migraines    Nonspecific abnormal electrocardiogram (ECG) (EKG) 08/30/2016   Obesity, unspecified  Sleep apnea    Stress and adjustment reaction 11/04/2016    Past Surgical History:  Procedure Laterality Date   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   Procedure: 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.;  Surgeon: Jolyn Nap, MD;  Location: [facility];  Service: Orthopedics;  Laterality: Left;   CHOLECYSTECTOMY N/A 10/03/2018   Procedure: LAPAROSCOPIC CHOLECYSTECTOMY;  Surgeon: Aviva Signs, MD;  Location: [facility];  Service: General;  Laterality: N/A;   FINGER  DEBRIDEMENT Left 09/25/2013   Thumb   FOOT FRACTURE SURGERY Right     Family History  Problem Relation Age of Onset   Diabetes Mother    Hypertension Mother    Hypertension Father    Arthritis Father    Diabetes Father    Colon cancer Neg Hx    Gastric cancer Neg Hx    Esophageal cancer Neg Hx     Social History   Socioeconomic History   Marital status: Married    Spouse name: Not on file   Number of children: 4   Years of education: Not on file   Highest education level: Not on file  Occupational History   Occupation: Musician     Comment: [employer]  Tobacco Use   Smoking status: Former    Packs/day: 0.10    Years: 15.00    Pack years: 1.50    Types: Cigarettes    Quit date: 09/15/2012    Years since quitting: 9.1   Smokeless tobacco: Never  Vaping Use   Vaping Use: Never used  Substance and Sexual Activity   Alcohol use: No   Drug use: No   Sexual activity: Yes  Other Topics Concern   Not on file  Social History Narrative   Not on file   Social Determinants of Health   Financial Resource Strain: Not on file  Food Insecurity: Not on file  Transportation Needs: Not on file  Physical Activity: Not on file  Stress: Not on file  Social Connections: Not on file  Intimate Partner Violence: Not on file      ROS:  General: Negative for anorexia, weight loss, fever, chills, fatigue, weakness. Eyes: Negative for vision changes.  ENT: Negative for hoarseness, difficulty swallowing , nasal congestion. CV: Negative for chest pain, angina, palpitations, dyspnea on exertion, peripheral edema.  Respiratory: Negative for dyspnea at rest, dyspnea on exertion, cough, sputum, wheezing.  GI: See history of present illness. GU:  Negative for dysuria, hematuria, urinary incontinence, urinary frequency, nocturnal urination.  MS: Negative for joint pain, low back pain.  Derm: Negative for rash or itching.  Neuro: Negative for weakness, abnormal  sensation, seizure, frequent headaches, memory loss, confusion.  Psych: Negative for anxiety, depression, suicidal ideation, hallucinations.  Endo: Negative for unusual weight change.  Heme: Negative for bruising or bleeding. Allergy: Negative for rash or hives.    Physical Examination:  There were no vitals taken for this visit.   General: Well-nourished, well-developed in no acute distress.  Head: Normocephalic, atraumatic.   Eyes: Conjunctiva pink, no icterus. Mouth: Oropharyngeal mucosa moist and pink , no lesions erythema or exudate. Neck: Supple without thyromegaly, masses, or lymphadenopathy.  Lungs: Clear to auscultation bilaterally.  Heart: Regular rate and rhythm, no murmurs rubs or gallops.  Abdomen: Bowel sounds are normal, nontender, nondistended, no hepatosplenomegaly or masses, no abdominal bruits or    hernia , no rebound or guarding.   Rectal: not perfomed Extremities: No lower extremity edema. No clubbing  or deformities.  Neuro: Alert and oriented x 4 , grossly normal neurologically.  Skin: Warm and dry, no rash or jaundice.   Psych: Alert and cooperative, normal mood and affect.  Labs: Lab Results  Component Value Date   CREATININE 3.18 (H) 07/12/2021   BUN 34 (H) 07/12/2021   NA 139 07/12/2021   K 4.0 07/12/2021   CL 109 07/12/2021   CO2 20 (L) 07/12/2021   Lab Results  Component Value Date   ALT 11 06/08/2021   AST 19 06/08/2021   ALKPHOS 69 06/08/2021   BILITOT 1.2 06/08/2021   Lab Results  Component Value Date   ALT 11 06/08/2021   AST 19 06/08/2021   ALKPHOS 69 06/08/2021   BILITOT 1.2 06/08/2021   Lab Results  Component Value Date   TSH 1.177 01/06/2018   Lab Results  Component Value Date   HGBA1C 4.6 (L) 06/25/2019   Lab Results  Component Value Date   IRON 84 06/29/2019   TIBC 213 (L) 06/29/2019   FERRITIN 312 06/29/2019     Imaging Studies: No results found.   Assessment:       Plan:

## 2021-10-27 ENCOUNTER — Encounter: Payer: Self-pay | Admitting: Internal Medicine

## 2021-10-27 ENCOUNTER — Ambulatory Visit: Payer: BC Managed Care – PPO | Admitting: Gastroenterology

## 2021-11-11 ENCOUNTER — Other Ambulatory Visit (HOSPITAL_COMMUNITY): Payer: Self-pay | Admitting: Nephrology

## 2021-11-11 ENCOUNTER — Other Ambulatory Visit: Payer: Self-pay | Admitting: Nephrology

## 2021-11-11 DIAGNOSIS — N184 Chronic kidney disease, stage 4 (severe): Secondary | ICD-10-CM

## 2021-11-11 DIAGNOSIS — R809 Proteinuria, unspecified: Secondary | ICD-10-CM

## 2021-11-11 DIAGNOSIS — I129 Hypertensive chronic kidney disease with stage 1 through stage 4 chronic kidney disease, or unspecified chronic kidney disease: Secondary | ICD-10-CM

## 2021-11-11 DIAGNOSIS — D631 Anemia in chronic kidney disease: Secondary | ICD-10-CM

## 2021-11-12 ENCOUNTER — Ambulatory Visit (INDEPENDENT_AMBULATORY_CARE_PROVIDER_SITE_OTHER): Payer: BC Managed Care – PPO | Admitting: Nurse Practitioner

## 2021-11-12 ENCOUNTER — Other Ambulatory Visit: Payer: Self-pay

## 2021-11-12 ENCOUNTER — Encounter: Payer: Self-pay | Admitting: Nurse Practitioner

## 2021-11-12 VITALS — BP 144/98 | HR 69 | Ht 69.0 in | Wt 171.1 lb

## 2021-11-12 DIAGNOSIS — I10 Essential (primary) hypertension: Secondary | ICD-10-CM | POA: Diagnosis not present

## 2021-11-12 DIAGNOSIS — Z0001 Encounter for general adult medical examination with abnormal findings: Secondary | ICD-10-CM

## 2021-11-12 DIAGNOSIS — Z0000 Encounter for general adult medical examination without abnormal findings: Secondary | ICD-10-CM | POA: Insufficient documentation

## 2021-11-12 DIAGNOSIS — Z1211 Encounter for screening for malignant neoplasm of colon: Secondary | ICD-10-CM | POA: Diagnosis not present

## 2021-11-12 DIAGNOSIS — I150 Renovascular hypertension: Secondary | ICD-10-CM | POA: Diagnosis not present

## 2021-11-12 DIAGNOSIS — Z125 Encounter for screening for malignant neoplasm of prostate: Secondary | ICD-10-CM | POA: Diagnosis not present

## 2021-11-12 DIAGNOSIS — Z122 Encounter for screening for malignant neoplasm of respiratory organs: Secondary | ICD-10-CM | POA: Insufficient documentation

## 2021-11-12 DIAGNOSIS — Z131 Encounter for screening for diabetes mellitus: Secondary | ICD-10-CM | POA: Diagnosis not present

## 2021-11-12 NOTE — Patient Instructions (Addendum)
Please get your covid booster at your pharmacy.   It is important that you exercise regularly at least 30 minutes 5 times a week.  Think about what you will eat, plan ahead. Choose " clean, green, fresh or frozen" over canned, processed or packaged foods which are more sugary, salty and fatty. 70 to 75% of food eaten should be vegetables and fruit. Three meals at set times with snacks allowed between meals, but they must be fruit or vegetables. Aim to eat over a 12 hour period , example 7 am to 7 pm, and STOP after  your last meal of the day. Drink water,generally about 64 ounces per day, no other drink is as healthy. Fruit juice is best enjoyed in a healthy way, by EATING the fruit.  Thanks for choosing [facility], we consider it a privelige to serve you.

## 2021-11-12 NOTE — Assessment & Plan Note (Addendum)
BP Readings from Last 3 Encounters:  11/12/21 (!) 144/98  06/10/21 134/82  06/08/21 (!) 144/94  Takes amlodipine 10 mg daily clonidine 0.3 mg/ 24 hours patch metoprolol 25 mg 2 times daily, Aldactone 25 mg twice daily. Blood pressure elevated in the office today, patient states that he has been eating a lot of salts DASH Diet advised, patient has an upcoming appointment with nephrology next week, patient encouraged to closely follow-up with nephrology for adjustment to his blood pressure medication as needed.

## 2021-11-12 NOTE — Assessment & Plan Note (Addendum)
Annual exam as documented.  Counseling done include healthy lifestyle involving committing to 150 minutes of exercise per week, heart healthy diet, and attaining healthy weight. The importance of adequate sleep also discussed.  Regular use of seat belt, patient encouraged not to use cell phone while driving, Importance of healthy diet discussed. Immunization and cancer screening  needs are specifically addressed at this visit.

## 2021-11-12 NOTE — Progress Notes (Signed)
Established Patient Office Visit  Subjective:  Patient ID: [name], male    DOB: 1971-02-12  Age: 52 y.o. MRN: [id number]  CC:  Chief Complaint  Patient presents with   Annual Exam    CPE    HPI [name] with medical history of hypertension, hypertensive cardiomyopathy, GERD, CKD stage IV presents for annual physical eaxm.   Pt sates he is due for annual eye exam , goes to my eye doctor in [place].   See Dr August Albino for his CKD, he has an upcomig appointment with nephrology next week.  Patient is due for shingles and COVID booster vaccine . Patient states that he will get his shingles vaccine at his next follow-up appointment.  Patient encouraged to get his COVID booster vaccine at the pharmacy he verbalized understanding.  Patient is due for colon cancer screening, Cologuard ordered today.  Patient denies any concern or adverse reactions to medications that he he is currently taking  Past Medical History:  Diagnosis Date   Acute kidney injury ([place])    Bilateral renal cysts 02/23/2014   Noted in 2013 initially    Chronic back pain    Depression, major, single episode, severe ([place]) 08/13/2018   pHQ 9 score of 24, not suicidal or homicidal, score of 8 in 09/2018, no medication or therapy as of this time   Duodenitis with bleeding 08/2008   Ulcer - admitted to [facility] hypertension, benign    GERD (gastroesophageal reflux disease)    Headache(784.0)    Hyperlipidemia    Hypertensive urgency 10/08/2019   Malignant hypertension 10/09/2007        Migraines    Nonspecific abnormal electrocardiogram (ECG) (EKG) 08/30/2016   Obesity, unspecified    Sleep apnea    Stress and adjustment reaction 11/04/2016    Past Surgical History:  Procedure Laterality Date   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   Procedure: 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.;  Surgeon: Jolyn Nap, MD;  Location: [facility];  Service:  Orthopedics;  Laterality: Left;   CHOLECYSTECTOMY N/A 10/03/2018   Procedure: LAPAROSCOPIC CHOLECYSTECTOMY;  Surgeon: Aviva Signs, MD;  Location: [facility];  Service: General;  Laterality: N/A;   FINGER DEBRIDEMENT Left 09/25/2013   Thumb   FOOT FRACTURE SURGERY Right     Family History  Problem Relation Age of Onset   Diabetes Mother    Hypertension Mother    Hypertension Father    Arthritis Father    Diabetes Father    Colon cancer Neg Hx    Gastric cancer Neg Hx    Esophageal cancer Neg Hx     Social History   Socioeconomic History   Marital status: Married    Spouse name: Not on file   Number of children: 4   Years of education: Not on file   Highest education level: Not on file  Occupational History   Occupation: Musician     Comment: [employer]  Tobacco Use   Smoking status: Former    Packs/day: 0.10    Years: 15.00    Pack years: 1.50    Types: Cigarettes    Quit date: 09/15/2012    Years since quitting: 9.1   Smokeless tobacco: Never  Vaping Use   Vaping Use: Never used  Substance and Sexual Activity   Alcohol use: No   Drug use: No   Sexual activity: Yes  Other Topics Concern  Not on file  Social History Narrative   Not on file   Social Determinants of Health   Financial Resource Strain: Not on file  Food Insecurity: Not on file  Transportation Needs: Not on file  Physical Activity: Not on file  Stress: Not on file  Social Connections: Not on file  Intimate Partner Violence: Not on file    Outpatient Medications Prior to Visit  Medication Sig Dispense Refill   amLODipine (NORVASC) 10 MG tablet TAKE 1 TABLET BY MOUTH IN THE MORNING AND TAKE 1 TABLET BY MOUTH AT 8 PM FOR BLOOD PRESSURE 180 tablet 1   cloNIDine (CATAPRES - DOSED IN MG/24 HR) 0.3 mg/24hr patch APPLY 1 PATCH ON THE SKIN ONCE A WEEK 12 patch 1   esomeprazole (NEXIUM) 20 MG capsule Take 1 capsule (20 mg total) by mouth 2 (two) times daily before a meal. (Patient  taking differently: Take 20 mg by mouth daily before breakfast.) 60 capsule 3   hydrOXYzine (VISTARIL) 50 MG capsule TAKE 1 CAPSULE BY MOUTH TWICE A DAY AS NEEDED FOR ANXIETY 60 capsule 2   metoprolol tartrate (LOPRESSOR) 25 MG tablet Take 1 tablet (25 mg total) by mouth 2 (two) times daily. 60 tablet 1   ondansetron (ZOFRAN) 4 MG tablet Take 1 tablet (4 mg total) by mouth every 6 (six) hours as needed for nausea or vomiting. 20 tablet 0   promethazine (PHENERGAN) 12.5 MG tablet Take 1 tablet (12.5 mg total) by mouth every 6 (six) hours as needed for nausea or vomiting. 30 tablet 0   spironolactone (ALDACTONE) 25 MG tablet Take 1 tablet (25 mg total) by mouth 2 (two) times daily. 180 tablet 0   No facility-administered medications prior to visit.    Allergies  Allergen Reactions   Bee Venom Anaphylaxis   Prochlorperazine Edisylate Other (See Comments)    Reaction: Jittery,uncomfortable feeling   Cyclobenzaprine Hcl Other (See Comments)    REACTION: feel anxious, nausea   Aleve [Naproxen Sodium] Nausea And Vomiting    States that this medication also causes abdominal pain   Carbamazepine     REACTION: unknown reaction   Geodon [Ziprasidone Hcl] Nausea And Vomiting   Ibuprofen Nausea And Vomiting    States that this medication also causes abdominal pain   Maxzide [Hydrochlorothiazide W-Triamterene]     headache   Nsaids Other (See Comments)    H/o severe anemia from UGIB from ulcer   Tylenol [Acetaminophen] Nausea And Vomiting    Patient states that this medication also causes abdominal pain   Other Nausea And Vomiting, Rash and Other (See Comments)    ALL MUSCLE RELAXANTS: states that they affect sciatic nerve, may cause nausea and vomiting, rash    ROS Review of Systems  Constitutional: Negative.   HENT: Negative.    Eyes: Negative.   Respiratory: Negative.    Cardiovascular: Negative.   Gastrointestinal: Negative.   Endocrine: Negative.   Genitourinary: Negative.    Musculoskeletal: Negative.   Skin: Negative.   Allergic/Immunologic: Negative.   Neurological: Negative.   Hematological: Negative.   Psychiatric/Behavioral: Negative.       Objective:    Physical Exam Constitutional:      General: He is not in acute distress.    Appearance: Normal appearance. He is normal weight. He is not ill-appearing, toxic-appearing or diaphoretic.  HENT:     Head: Normocephalic and atraumatic.     Right Ear: Tympanic membrane, ear canal and external ear normal. There is no impacted cerumen.  Left Ear: Tympanic membrane, ear canal and external ear normal. There is no impacted cerumen.     Nose: Nose normal. No congestion or rhinorrhea.     Mouth/Throat:     Mouth: Mucous membranes are moist.     Pharynx: Oropharynx is clear. No oropharyngeal exudate or posterior oropharyngeal erythema.  Eyes:     General: No scleral icterus.       Right eye: No discharge.        Left eye: No discharge.     Extraocular Movements: Extraocular movements intact.     Conjunctiva/sclera: Conjunctivae normal.     Pupils: Pupils are equal, round, and reactive to light.  Neck:     Vascular: No carotid bruit.  Cardiovascular:     Rate and Rhythm: Normal rate and regular rhythm.     Pulses: Normal pulses.     Heart sounds: Normal heart sounds. No murmur heard.   No friction rub. No gallop.  Pulmonary:     Effort: No respiratory distress.     Breath sounds: No stridor. No wheezing, rhonchi or rales.  Chest:     Chest wall: No tenderness.  Abdominal:     General: There is no distension.     Palpations: Abdomen is soft. There is no mass.     Tenderness: There is no abdominal tenderness. There is no right CVA tenderness, left CVA tenderness, guarding or rebound.     Hernia: No hernia is present.  Musculoskeletal:        General: No swelling, tenderness, deformity or signs of injury.     Cervical back: No rigidity or tenderness.     Right lower leg: No edema.     Left  lower leg: No edema.  Lymphadenopathy:     Cervical: No cervical adenopathy.  Skin:    General: Skin is warm and dry.     Capillary Refill: Capillary refill takes less than 2 seconds.     Coloration: Skin is not jaundiced or pale.     Findings: No bruising, erythema, lesion or rash.  Neurological:     Mental Status: He is alert and oriented to person, place, and time.     Cranial Nerves: No cranial nerve deficit.     Sensory: No sensory deficit.     Motor: No weakness.     Coordination: Coordination normal.     Gait: Gait normal.     Deep Tendon Reflexes: Reflexes normal.  Psychiatric:        Mood and Affect: Mood normal.        Behavior: Behavior normal.        Thought Content: Thought content normal.        Judgment: Judgment normal.    BP (!) 142/100    Pulse 69    Ht _0  (1.753 m)    Wt 171 lb 1.3 oz (77.6 kg)    SpO2 98%    BMI 25.26 kg/m  Wt Readings from Last 3 Encounters:  11/12/21 171 lb 1.3 oz (77.6 kg)  06/10/21 178 lb (80.7 kg)  11/12/20 205 lb (93 kg)     Health Maintenance Due  Topic Date Due   Zoster Vaccines- Shingrix (1 of 2) Never done   COLONOSCOPY (Pts 45-68yr Insurance coverage will need to be confirmed)  Never done   COVID-19 Vaccine (3 - Pfizer risk series) 12/07/2020    There are no preventive care reminders to display for this patient.  Lab Results  Component Value  Date   TSH 1.177 01/06/2018   Lab Results  Component Value Date   WBC 5.3 07/12/2021   HGB 11.5 (L) 07/12/2021   HCT 32.9 (L) 07/12/2021   MCV 98.2 07/12/2021   PLT 267 07/12/2021   Lab Results  Component Value Date   NA 139 07/12/2021   K 4.0 07/12/2021   CO2 20 (L) 07/12/2021   GLUCOSE 113 (H) 07/12/2021   BUN 34 (H) 07/12/2021   CREATININE 3.18 (H) 07/12/2021   BILITOT 1.2 06/08/2021   ALKPHOS 69 06/08/2021   AST 19 06/08/2021   ALT 11 06/08/2021   PROT 7.6 06/08/2021   ALBUMIN 4.7 06/08/2021   CALCIUM 9.2 07/12/2021   ANIONGAP 10 07/12/2021   Lab Results   Component Value Date   CHOL 118 11/13/2020   Lab Results  Component Value Date   HDL 45 11/13/2020   Lab Results  Component Value Date   LDLCALC 58 11/13/2020   Lab Results  Component Value Date   TRIG 73 11/13/2020   Lab Results  Component Value Date   CHOLHDL 2.6 11/13/2020   Lab Results  Component Value Date   HGBA1C 4.6 (L) 06/25/2019      Assessment & Plan:   Problem List Items Addressed This Visit   None Visit Diagnoses     Annual physical exam    -  Primary   Relevant Orders   CBC with Differential   Lipid Profile   TSH   Vitamin D (25 hydroxy)   CMP14+EGFR   HgB A1c       No orders of the defined types were placed in this encounter.   Follow-up: No follow-ups on file.    Renee Rival, FNP

## 2021-11-13 ENCOUNTER — Other Ambulatory Visit: Payer: Self-pay | Admitting: Nurse Practitioner

## 2021-11-13 DIAGNOSIS — E559 Vitamin D deficiency, unspecified: Secondary | ICD-10-CM

## 2021-11-13 LAB — PSA: Prostate Specific Ag, Serum: 1.6 ng/mL (ref 0.0–4.0)

## 2021-11-13 MED ORDER — VITAMIN D3 25 MCG (1000 UT) PO CAPS: 1000.0000 [IU] | ORAL_CAPSULE | Freq: Every day | ORAL | 3 refills | Status: AC

## 2021-11-15 ENCOUNTER — Telehealth: Payer: Self-pay | Admitting: Family Medicine

## 2021-11-15 LAB — CBC WITH DIFFERENTIAL/PLATELET
Basophils Absolute: 0 10*3/uL (ref 0.0–0.2)
Basos: 1 %
EOS (ABSOLUTE): 0 10*3/uL (ref 0.0–0.4)
Eos: 1 %
Hematocrit: 36.1 % — ABNORMAL LOW (ref 37.5–51.0)
Hemoglobin: 12 g/dL — ABNORMAL LOW (ref 13.0–17.7)
Immature Grans (Abs): 0 10*3/uL (ref 0.0–0.1)
Immature Granulocytes: 0 %
Lymphocytes Absolute: 1 10*3/uL (ref 0.7–3.1)
Lymphs: 22 %
MCH: 34.1 pg — ABNORMAL HIGH (ref 26.6–33.0)
MCHC: 33.2 g/dL (ref 31.5–35.7)
MCV: 103 fL — ABNORMAL HIGH (ref 79–97)
Monocytes Absolute: 0.3 10*3/uL (ref 0.1–0.9)
Monocytes: 7 %
Neutrophils Absolute: 3 10*3/uL (ref 1.4–7.0)
Neutrophils: 69 %
Platelets: 286 10*3/uL (ref 150–450)
RBC: 3.52 x10E6/uL — ABNORMAL LOW (ref 4.14–5.80)
RDW: 11.5 % — ABNORMAL LOW (ref 11.6–15.4)
WBC: 4.3 10*3/uL (ref 3.4–10.8)

## 2021-11-15 LAB — CMP14+EGFR
ALT: 5 IU/L (ref 0–44)
AST: 13 IU/L (ref 0–40)
Albumin/Globulin Ratio: 2 (ref 1.2–2.2)
Albumin: 4.6 g/dL (ref 3.8–4.9)
Alkaline Phosphatase: 70 IU/L (ref 44–121)
BUN/Creatinine Ratio: 7 — ABNORMAL LOW (ref 9–20)
BUN: 22 mg/dL (ref 6–24)
Bilirubin Total: 0.6 mg/dL (ref 0.0–1.2)
CO2: 24 mmol/L (ref 20–29)
Calcium: 9.1 mg/dL (ref 8.7–10.2)
Chloride: 104 mmol/L (ref 96–106)
Creatinine, Ser: 3.01 mg/dL — ABNORMAL HIGH (ref 0.76–1.27)
Globulin, Total: 2.3 g/dL (ref 1.5–4.5)
Glucose: 87 mg/dL (ref 70–99)
Potassium: 4.3 mmol/L (ref 3.5–5.2)
Sodium: 142 mmol/L (ref 134–144)
Total Protein: 6.9 g/dL (ref 6.0–8.5)
eGFR: 24 mL/min/{1.73_m2} — ABNORMAL LOW (ref >59)

## 2021-11-15 LAB — HEMOGLOBIN A1C
Est. average glucose Bld gHb Est-mCnc: 88 mg/dL
Hgb A1c MFr Bld: 4.7 % — ABNORMAL LOW (ref 4.8–5.6)

## 2021-11-15 LAB — VITAMIN D 25 HYDROXY (VIT D DEFICIENCY, FRACTURES): Vit D, 25-Hydroxy: 16.7 ng/mL — ABNORMAL LOW (ref 30.0–100.0)

## 2021-11-15 LAB — LIPID PANEL
Chol/HDL Ratio: 2.3 ratio (ref 0.0–5.0)
Cholesterol, Total: 106 mg/dL (ref 100–199)
HDL: 46 mg/dL (ref >39)
LDL Chol Calc (NIH): 46 mg/dL (ref 0–99)
Triglycerides: 63 mg/dL (ref 0–149)
VLDL Cholesterol Cal: 14 mg/dL (ref 5–40)

## 2021-11-15 LAB — TSH: TSH: 1.75 u[IU]/mL (ref 0.450–4.500)

## 2021-11-15 NOTE — Telephone Encounter (Signed)
Please check on this- something in a folder is on your desk for him. Needs to be filled in with his recent lab results and ht, wt etc and faxed back today

## 2021-11-15 NOTE — Telephone Encounter (Signed)
Aware forms were sent

## 2021-11-15 NOTE — Telephone Encounter (Signed)
Called in regard to patient visit on 2/24 Patient came in with some forms that needed to be faxed in for insurance purposes.  Spouse called in to check in on the forms, and if they had been faxed. The forms need to be submitted today if not already.  Spouse wants a call back in regard.

## 2021-11-15 NOTE — Progress Notes (Signed)
Please review results with patient  Chronic anemia condition is unchanged, chronic kidney disease is stable, patient should maintain close follow-up with nephrology, patient should avoid ibuprofen, Aleve.  Vitamin D level is low patient should start taking vitamin D 1000 daily, recheck labs at next follow-up visit.  Lipid panel is normal, PSA is within normal limits, thyroid function is normal.

## 2021-11-15 NOTE — Telephone Encounter (Signed)
Please call patient # [phone] about faxed forms

## 2021-11-15 NOTE — Telephone Encounter (Signed)
Faxed

## 2021-11-30 ENCOUNTER — Encounter (HOSPITAL_COMMUNITY): Payer: Self-pay

## 2021-11-30 ENCOUNTER — Ambulatory Visit (HOSPITAL_COMMUNITY): Admission: RE | Admit: 2021-11-30 | Payer: BC Managed Care – PPO | Source: Ambulatory Visit

## 2021-12-30 ENCOUNTER — Other Ambulatory Visit: Payer: Self-pay | Admitting: Family Medicine

## 2022-02-04 ENCOUNTER — Other Ambulatory Visit: Payer: Self-pay | Admitting: Family Medicine

## 2022-02-04 ENCOUNTER — Other Ambulatory Visit: Payer: Self-pay

## 2022-02-04 ENCOUNTER — Telehealth: Payer: Self-pay | Admitting: Family Medicine

## 2022-02-04 MED ORDER — SPIRONOLACTONE 25 MG PO TABS: 25.0000 mg | ORAL_TABLET | Freq: Two times a day (BID) | ORAL | 1 refills | Status: DC

## 2022-02-04 NOTE — Telephone Encounter (Signed)
Refills sent to [facility]

## 2022-02-04 NOTE — Telephone Encounter (Signed)
Pt called stating he is completely out of spironolactone (ALDACTONE) 25 MG tablet. He states he did not have any to take today. Wants to know if this can please refilled?  [facility]

## 2022-03-08 ENCOUNTER — Encounter: Payer: Self-pay | Admitting: Internal Medicine

## 2022-03-08 ENCOUNTER — Ambulatory Visit: Payer: BC Managed Care – PPO | Admitting: Gastroenterology

## 2022-03-08 NOTE — Progress Notes (Deleted)
GI Office Note    Referring Provider: Fayrene Helper, MD Primary Care Physician:  Fayrene Helper, MD  Primary Gastroenterologist: Garfield Cornea, MD   Chief Complaint   No chief complaint on file.    History of Present Illness   [name] is a 51 y.o. male presenting today at the request of Dr. Moshe Cipro for further evaluation of unintentional weight loss and needing colonoscopy.  Referral dates back to September.  He no-showed his February appointment.  Patient was seen previously in 2021 for GERD, dysphagia, vomiting, weight loss.  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    ED was scheduled for an EGD and colonoscopy in May 2021 but he did not follow through.  Multiple failed attempts to reschedule his procedures.    Patient has chronic anemia, chronic kidney disease stage 4.  Followed by Dr. Theador Hawthorne.  MCV at 103 back in February.  Hemoglobin typically in the 11-12 range over the past couple of years.  In 2020 ferritin was 312, TIBC 213.      Medications   Current Outpatient Medications  Medication Sig Dispense Refill   amLODipine (NORVASC) 10 MG tablet TAKE 1 TABLET BY MOUTH IN THE MORNING AND TAKE 1 TABLET BY MOUTH AT 8 PM FOR BLOOD PRESSURE 180 tablet 1   Cholecalciferol (VITAMIN D3) 25 MCG (1000 UT) CAPS Take 1 capsule (1,000 Units total) by mouth daily. 60 capsule 3   cloNIDine (CATAPRES - DOSED IN MG/24 HR) 0.3 mg/24hr patch APPLY 1 PATCH ON THE SKIN ONCE A WEEK 12 patch 1   esomeprazole (NEXIUM) 20 MG capsule Take 1 capsule (20 mg total) by mouth 2 (two) times daily before a meal. (Patient taking differently: Take 20 mg by mouth daily before breakfast.) 60 capsule 3   hydrOXYzine (VISTARIL) 50 MG capsule TAKE 1 CAPSULE BY MOUTH TWICE A DAY AS NEEDED FOR ANXIETY 60 capsule 2   metoprolol tartrate (LOPRESSOR) 25  MG tablet Take 1 tablet (25 mg total) by mouth 2 (two) times daily. 60 tablet 1   ondansetron (ZOFRAN) 4 MG tablet Take 1 tablet (4 mg total) by mouth every 6 (six) hours as needed for nausea or vomiting. 20 tablet 0   spironolactone (ALDACTONE) 25 MG tablet Take 1 tablet (25 mg total) by mouth 2 (two) times daily. 180 tablet 1   No current facility-administered medications for this visit.    Allergies   Allergies as of 03/08/2022 - Review Complete 11/12/2021  Allergen Reaction Noted   Bee venom Anaphylaxis 07/04/2011   Prochlorperazine edisylate Other (See Comments) 01/07/2009   Cyclobenzaprine hcl Other (See Comments) 01/07/2009   Aleve [naproxen sodium] Nausea And Vomiting 07/10/2012   Carbamazepine     Geodon [ziprasidone hcl] Nausea And Vomiting 08/31/2015   Ibuprofen Nausea And Vomiting 07/10/2012   Maxzide [hydrochlorothiazide w-triamterene]  02/19/2014   Nsaids Other (See Comments) 06/21/2014   Tylenol [acetaminophen] Nausea And Vomiting 07/10/2012   Other Nausea And Vomiting, Rash, and Other (See Comments) 07/10/2012    Past Medical History   Past Medical History:  Diagnosis Date   Acute kidney injury ([place])    Bilateral renal cysts 02/23/2014   Noted in 2013 initially    Chronic back pain    Depression, major, single episode, severe ([place]) 08/13/2018   pHQ 9 score of 24, not suicidal or homicidal, score of 8  in 09/2018, no medication or therapy as of this time   Duodenitis with bleeding 08/2008   Ulcer - admitted to [facility] hypertension, benign    GERD (gastroesophageal reflux disease)    Headache(784.0)    Hyperlipidemia    Hypertensive urgency 10/08/2019   Malignant hypertension 10/09/2007        Migraines    Nonspecific abnormal electrocardiogram (ECG) (EKG) 08/30/2016   Obesity, unspecified    Sleep apnea    Stress and adjustment reaction 11/04/2016    Past Surgical History   Past Surgical History:  Procedure Laterality Date    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   Procedure: 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.;  Surgeon: Jolyn Nap, MD;  Location: [facility];  Service: Orthopedics;  Laterality: Left;   CHOLECYSTECTOMY N/A 10/03/2018   Procedure: LAPAROSCOPIC CHOLECYSTECTOMY;  Surgeon: Aviva Signs, MD;  Location: [facility];  Service: General;  Laterality: N/A;   FINGER DEBRIDEMENT Left 09/25/2013   Thumb   FOOT FRACTURE SURGERY Right     Past Family History   Family History  Problem Relation Age of Onset   Diabetes Mother    Hypertension Mother    Hypertension Father    Arthritis Father    Diabetes Father    Colon cancer Neg Hx    Gastric cancer Neg Hx    Esophageal cancer Neg Hx     Past Social History   Social History   Socioeconomic History   Marital status: Married    Spouse name: Not on file   Number of children: 4   Years of education: Not on file   Highest education level: Not on file  Occupational History   Occupation: Musician     Comment: [place]  Tobacco Use   Smoking status: Former    Packs/day: 0.10    Years: 15.00    Total pack years: 1.50    Types: Cigarettes    Quit date: 09/15/2012    Years since quitting: 9.4   Smokeless tobacco: Never  Vaping Use   Vaping Use: Never used  Substance and Sexual Activity   Alcohol use: No   Drug use: No   Sexual activity: Yes  Other Topics Concern   Not on file  Social History Narrative   Lives with his family, work part time at [employer].    Social Determinants of Health   Financial Resource Strain: Low Risk  (06/25/2019)   Overall Financial Resource Strain (CARDIA)    Difficulty of Paying Living Expenses: Not very hard  Food Insecurity: No Food Insecurity (06/25/2019)   Hunger Vital Sign    Worried About Running Out of Food in the Last Year: Never true    Ran Out of Food in the Last Year: Never true  Transportation Needs: No Transportation Needs (06/25/2019)   PRAPARE -  Hydrologist (Medical): No    Lack of Transportation (Non-Medical): No  Physical Activity: Unknown (06/25/2019)   Exercise Vital Sign    Days of Exercise per Week: Patient refused    Minutes of Exercise per Session: Patient refused  Stress: Not on file  Social Connections: Unknown (06/25/2019)   Social Connection and Isolation Panel [NHANES]    Frequency of Communication with Friends and Family: Patient refused    Frequency of Social Gatherings with Friends and Family: Patient refused    Attends Religious Services: Patient refused    Active  Member of Clubs or Organizations: Patient refused    Attends Archivist Meetings: Patient refused    Marital Status: Patient refused  Intimate Partner Violence: Not At Risk (06/25/2019)   Humiliation, Afraid, Rape, and Kick questionnaire    Fear of Current or Ex-Partner: No    Emotionally Abused: No    Physically Abused: No    Sexually Abused: No    Review of Systems   General: Negative for anorexia, weight loss, fever, chills, fatigue, weakness. Eyes: Negative for vision changes.  ENT: Negative for hoarseness, difficulty swallowing , nasal congestion. CV: Negative for chest pain, angina, palpitations, dyspnea on exertion, peripheral edema.  Respiratory: Negative for dyspnea at rest, dyspnea on exertion, cough, sputum, wheezing.  GI: See history of present illness. GU:  Negative for dysuria, hematuria, urinary incontinence, urinary frequency, nocturnal urination.  MS: Negative for joint pain, low back pain.  Derm: Negative for rash or itching.  Neuro: Negative for weakness, abnormal sensation, seizure, frequent headaches, memory loss,  confusion.  Psych: Negative for anxiety, depression, suicidal ideation, hallucinations.  Endo: Negative for unusual weight change.  Heme: Negative for bruising or bleeding. Allergy: Negative for rash or hives.  Physical Exam   There were no vitals taken for this visit.    General: Well-nourished, well-developed in no acute distress.  Head: Normocephalic, atraumatic.   Eyes: Conjunctiva pink, no icterus. Mouth: Oropharyngeal mucosa moist and pink , no lesions erythema or exudate. Neck: Supple without thyromegaly, masses, or lymphadenopathy.  Lungs: Clear to auscultation bilaterally.  Heart: Regular rate and rhythm, no murmurs rubs or gallops.  Abdomen: Bowel sounds are normal, nontender, nondistended, no hepatosplenomegaly or masses,  no abdominal bruits or hernia, no rebound or guarding.   Rectal: *** Extremities: No lower extremity edema. No clubbing or deformities.  Neuro: Alert and oriented x 4 , grossly normal neurologically.  Skin: Warm and dry, no rash or jaundice.   Psych: Alert and cooperative, normal mood and affect.  Labs   Lab Results  Component Value Date   CREATININE 3.01 (H) 11/12/2021   BUN 22 11/12/2021   NA 142 11/12/2021   K 4.3 11/12/2021   CL 104 11/12/2021   CO2 24 11/12/2021   Lab Results  Component Value Date   ALT 5 11/12/2021   AST 13 11/12/2021   ALKPHOS 70 11/12/2021   BILITOT 0.6 11/12/2021   Lab Results  Component Value Date   WBC 4.3 11/12/2021   HGB 12.0 (L) 11/12/2021   HCT 36.1 (L) 11/12/2021   MCV 103 (H) 11/12/2021   PLT 286 11/12/2021   Lab Results  Component Value Date   HGBA1C 4.7 (L) 11/12/2021   Lab Results  Component Value Date   IRON 84 06/29/2019   TIBC 213 (L) 06/29/2019   FERRITIN 312 06/29/2019   Lab Results  Component Value Date   TSH 1.750 11/12/2021    Imaging Studies   No results found.  Assessment       PLAN   ***   Laureen Ochs. Bobby Rumpf, [place], [facility]

## 2022-03-15 ENCOUNTER — Ambulatory Visit: Payer: BC Managed Care – PPO | Admitting: Family Medicine

## 2022-03-18 ENCOUNTER — Ambulatory Visit: Payer: BC Managed Care – PPO | Admitting: Family Medicine

## 2022-03-23 ENCOUNTER — Ambulatory Visit (INDEPENDENT_AMBULATORY_CARE_PROVIDER_SITE_OTHER): Payer: BC Managed Care – PPO | Admitting: Family Medicine

## 2022-03-23 ENCOUNTER — Encounter: Payer: Self-pay | Admitting: Family Medicine

## 2022-03-23 VITALS — BP 143/82 | HR 58 | Ht 73.0 in | Wt 173.0 lb

## 2022-03-23 DIAGNOSIS — N184 Chronic kidney disease, stage 4 (severe): Secondary | ICD-10-CM

## 2022-03-23 DIAGNOSIS — R5383 Other fatigue: Secondary | ICD-10-CM

## 2022-03-23 DIAGNOSIS — G43709 Chronic migraine without aura, not intractable, without status migrainosus: Secondary | ICD-10-CM

## 2022-03-23 DIAGNOSIS — R519 Headache, unspecified: Secondary | ICD-10-CM | POA: Diagnosis not present

## 2022-03-23 DIAGNOSIS — G479 Sleep disorder, unspecified: Secondary | ICD-10-CM | POA: Diagnosis not present

## 2022-03-23 DIAGNOSIS — I10 Essential (primary) hypertension: Secondary | ICD-10-CM

## 2022-03-23 DIAGNOSIS — Z1211 Encounter for screening for malignant neoplasm of colon: Secondary | ICD-10-CM

## 2022-03-23 DIAGNOSIS — I119 Hypertensive heart disease without heart failure: Secondary | ICD-10-CM

## 2022-03-23 DIAGNOSIS — I43 Cardiomyopathy in diseases classified elsewhere: Secondary | ICD-10-CM

## 2022-03-23 MED ORDER — SPIRONOLACTONE 25 MG PO TABS: ORAL_TABLET | ORAL | 4 refills | Status: DC

## 2022-03-23 NOTE — Patient Instructions (Addendum)
Annual exam and re evaluate blood pressure in 3 monhts, call if you need me sooner  You are being referred to neurology re uncontrolled , disabling headaches, and also to Pulmonary  for evaluation for sleep apnea  Please get non fasting chem 7 and EGFR first week in August  Higher dose spironolactone 25 mg TWO in the morning and one in the evening, blood pressure still higher than goal  Please do send in cologuard test for colon cancer screening , that is important  Thanks for choosing [facility], we consider it a privelige to serve you.  Recommend Shingrix vaccines and will give it to you in the office once you decide to get that done

## 2022-03-27 ENCOUNTER — Encounter: Payer: Self-pay | Admitting: Family Medicine

## 2022-03-27 NOTE — Progress Notes (Signed)
   [name]     MRN: [id number]      DOB: 06-24-1971   HPI [name] is here for follow up and re-evaluation of chronic medical conditions, medication management and review of any available recent lab and radiology data.  Preventive health is updated, specifically  Cancer screening and Immunization.   C/o frequent severe and uncontrolled headaches, needs to establish with Neurologist and wishes to be seen in [place] Has h/o snoring with chronic fatigue, needs eval for OSA   ROS Denies recent fever or chills. Denies sinus pressure, nasal congestion, ear pain or sore throat. Denies chest congestion, productive cough or wheezing. Denies chest pains, palpitations and leg swelling Denies abdominal pain, nausea, vomiting,diarrhea or constipation.   Denies dysuria, frequency, hesitancy or incontinence. Denies joint pain, swelling and limitation in mobility. Denies skin break down or rash.   PE  BP (!) 143/82   Pulse (!) 58   Ht 6\' 1"  (1.854 m)   Wt 173 lb 0.6 oz (78.5 kg)   SpO2 96%   BMI 22.83 kg/m   Patient alert and oriented and in no cardiopulmonary distress.In pain from headache  HEENT: No facial asymmetry, EOMI,     Neck supple .  Chest: Clear to auscultation bilaterally.  CVS: S1, S2 no murmurs, no S3.Regular rate.  ABD: Soft non tender.   Ext: No edema  MS: Adequate ROM spine, shoulders, hips and knees.  Skin: Intact, no ulcerations or rash noted.  Psych: Good eye contact, normal affect. Memory intact not anxious or depressed appearing.  CNS: CN 2-12 intact, power,  normal throughout.no focal deficits noted.   Assessment & Plan  Fatigue due to sleep pattern disturbance Refer tor sleep eval  Migraines Uncontrolled and on no medication currently refer Neurology  Hypertension Not at goal, inc spironolactone dose DASH diet and commitment to daily physical activity for a minimum of 30 minutes discussed and encouraged, as a part of hypertension  management. The importance of attaining a healthy weight is also discussed.     03/23/2022    3:33 PM 11/12/2021   12:26 PM 11/12/2021   11:23 AM 07/12/2021    8:45 PM 07/12/2021    8:43 PM 06/10/2021   10:23 AM 06/10/2021    9:52 AM  BP/Weight  Systolic BP 196 222 979 892  119 417  Diastolic BP 82 98 408 144  82 80  Wt. (Lbs) 173.04  171.08  178  178  BMI 22.83 kg/m2  25.26 kg/m2  24.14 kg/m2  24.14 kg/m2       Hypertensive cardiomyopathy (HCC) Needs to follow up with cardiology will arrange  CKD (chronic kidney disease) stage 4, GFR 15-29 ml/min ([place]) Follows with nephrology

## 2022-03-27 NOTE — Assessment & Plan Note (Signed)
Refer tor sleep eval

## 2022-03-27 NOTE — Assessment & Plan Note (Signed)
Follows with nephrology.

## 2022-03-27 NOTE — Assessment & Plan Note (Signed)
Uncontrolled and on no medication currently refer Neurology

## 2022-03-27 NOTE — Assessment & Plan Note (Signed)
Needs to follow up with cardiology will arrange

## 2022-03-27 NOTE — Assessment & Plan Note (Signed)
Not at goal, inc spironolactone dose DASH diet and commitment to daily physical activity for a minimum of 30 minutes discussed and encouraged, as a part of hypertension management. The importance of attaining a healthy weight is also discussed.     03/23/2022    3:33 PM 11/12/2021   12:26 PM 11/12/2021   11:23 AM 07/12/2021    8:45 PM 07/12/2021    8:43 PM 06/10/2021   10:23 AM 06/10/2021    9:52 AM  BP/Weight  Systolic BP 888 757 972 820  601 561  Diastolic BP 82 98 537 943  82 80  Wt. (Lbs) 173.04  171.08  178  178  BMI 22.83 kg/m2  25.26 kg/m2  24.14 kg/m2  24.14 kg/m2

## 2022-03-31 ENCOUNTER — Telehealth: Payer: Self-pay | Admitting: Family Medicine

## 2022-03-31 NOTE — Telephone Encounter (Signed)
This is the note from [facility] when they received the referral for his headaches-  After requesting one of our provider's review it, they recommend he complete his sleep work up prior to seeing neurology since that could be the cause of his headaches. If that's negative or the headaches don't improve, we can definitely see him.

## 2022-04-01 ENCOUNTER — Other Ambulatory Visit: Payer: Self-pay | Admitting: Family Medicine

## 2022-04-01 DIAGNOSIS — G479 Sleep disorder, unspecified: Secondary | ICD-10-CM

## 2022-04-01 NOTE — Telephone Encounter (Signed)
Pt aware.

## 2022-05-12 ENCOUNTER — Institutional Professional Consult (permissible substitution): Payer: BC Managed Care – PPO | Admitting: Pulmonary Disease

## 2022-06-23 ENCOUNTER — Encounter: Payer: Self-pay | Admitting: Family Medicine

## 2022-06-23 ENCOUNTER — Other Ambulatory Visit: Payer: Self-pay

## 2022-06-23 ENCOUNTER — Ambulatory Visit (INDEPENDENT_AMBULATORY_CARE_PROVIDER_SITE_OTHER): Payer: BC Managed Care – PPO | Admitting: Family Medicine

## 2022-06-23 VITALS — BP 138/72 | HR 84 | Ht 69.0 in | Wt 175.0 lb

## 2022-06-23 DIAGNOSIS — N184 Chronic kidney disease, stage 4 (severe): Secondary | ICD-10-CM

## 2022-06-23 DIAGNOSIS — I150 Renovascular hypertension: Secondary | ICD-10-CM | POA: Diagnosis not present

## 2022-06-23 DIAGNOSIS — G479 Sleep disorder, unspecified: Secondary | ICD-10-CM

## 2022-06-23 DIAGNOSIS — I10 Essential (primary) hypertension: Secondary | ICD-10-CM | POA: Diagnosis not present

## 2022-06-23 NOTE — Progress Notes (Signed)
      IVERY Dennis     MRN: [id number]      DOB: 1971/06/25   HPI [name] is here for follow up and re-evaluation of chronic medical conditions, medication management and review of any available recent lab and radiology data.  Preventive health is updated, specifically  Cancer screening and Immunization.   Still needs Neurology eval for uncontrolled headaches, needs sleep study done The PT denies any adverse reactions to current medications since the last visit.  Stressed over inability to care for hois Mother at home with her repeated falls  ROS Denies recent fever or chills. Denies sinus pressure, nasal congestion, ear pain or sore throat. Denies chest congestion, productive cough or wheezing. Denies chest pains, palpitations and leg swelling Denies abdominal pain, nausea, vomiting,diarrhea or constipation.   Denies dysuria, frequency, hesitancy or incontinence. Denies joint pain, swelling and limitation in mobility. . Denies skin break down or rash.   PE  BP 138/72 (BP Location: Right Arm, Patient Position: Sitting)   Pulse 84   Ht 5\' 9"  (1.753 m)   Wt 175 lb (79.4 kg)   SpO2 93%   BMI 25.84 kg/m   Patient alert and oriented and in no cardiopulmonary distress.  HEENT: No facial asymmetry, EOMI,     Neck supple .  Chest: Clear to auscultation bilaterally.  CVS: S1, S2 no murmurs, no S3.Regular rate.  ABD: Soft non tender.   Ext: No edema  MS: Adequate ROM spine, shoulders, hips and knees.  Skin: Intact, no ulcerations or rash noted.  Psych: Good eye contact, normal affect. Memory intact  anxious not  depressed appearing.  CNS: CN 2-12 intact, power,  normal throughout.no focal deficits noted.   Assessment & Plan  Hypertension Controlled, no change in medication DASH diet and commitment to daily physical activity for a minimum of 30 minutes discussed and encouraged, as a part of hypertension management. The importance of attaining a healthy weight  is also discussed.     06/23/2022    4:03 PM 03/23/2022    3:33 PM 11/12/2021   12:26 PM 11/12/2021   11:23 AM 07/12/2021    8:45 PM 07/12/2021    8:43 PM 06/10/2021   10:23 AM  BP/Weight  Systolic BP 409 811 914 782 956  213  Diastolic BP 72 82 98 086 578  82  Wt. (Lbs) 175 173.04  171.08  178   BMI 25.84 kg/m2 22.83 kg/m2  25.26 kg/m2  24.14 kg/m2        Sleep disorder Needs re evaluation and management, he understands, refer to pulmonary  CKD (chronic kidney disease) stage 4, GFR 15-29 ml/min ([place]) Updated lab needed at/ before next visit. Needs re eval by Nephrology, referral entered

## 2022-06-23 NOTE — Patient Instructions (Signed)
F/u in feb, call if you need me sooner  Please call for flu vaccine in the near future  Chem 7 and EGFR today  You need to see Nephrology, pulmonary and Neurology, referralswill be re entered  Continue current bP meds   Thanks for choosing [facility], we consider it a privelige to serve you.

## 2022-06-27 ENCOUNTER — Encounter: Payer: Self-pay | Admitting: Family Medicine

## 2022-06-27 NOTE — Assessment & Plan Note (Signed)
Updated lab needed at/ before next visit. Needs re eval by Nephrology, referral entered

## 2022-06-27 NOTE — Assessment & Plan Note (Signed)
Needs re evaluation and management, he understands, refer to pulmonary

## 2022-06-27 NOTE — Assessment & Plan Note (Signed)
Controlled, no change in medication DASH diet and commitment to daily physical activity for a minimum of 30 minutes discussed and encouraged, as a part of hypertension management. The importance of attaining a healthy weight is also discussed.     06/23/2022    4:03 PM 03/23/2022    3:33 PM 11/12/2021   12:26 PM 11/12/2021   11:23 AM 07/12/2021    8:45 PM 07/12/2021    8:43 PM 06/10/2021   10:23 AM  BP/Weight  Systolic BP 174 099 278 004 471  580  Diastolic BP 72 82 98 638 685  82  Wt. (Lbs) 175 173.04  171.08  178   BMI 25.84 kg/m2 22.83 kg/m2  25.26 kg/m2  24.14 kg/m2

## 2022-06-28 LAB — BMP8+EGFR
BUN/Creatinine Ratio: 11 (ref 9–20)
BUN: 31 mg/dL — ABNORMAL HIGH (ref 6–24)
CO2: 23 mmol/L (ref 20–29)
Calcium: 9.1 mg/dL (ref 8.7–10.2)
Chloride: 101 mmol/L (ref 96–106)
Creatinine, Ser: 2.9 mg/dL — ABNORMAL HIGH (ref 0.76–1.27)
Glucose: 82 mg/dL (ref 70–99)
Potassium: 4.6 mmol/L (ref 3.5–5.2)
Sodium: 141 mmol/L (ref 134–144)
eGFR: 25 mL/min/{1.73_m2} — ABNORMAL LOW (ref >59)

## 2022-07-06 ENCOUNTER — Encounter: Payer: Self-pay | Admitting: Pulmonary Disease

## 2022-07-06 ENCOUNTER — Ambulatory Visit (INDEPENDENT_AMBULATORY_CARE_PROVIDER_SITE_OTHER): Payer: BC Managed Care – PPO | Admitting: Pulmonary Disease

## 2022-07-06 VITALS — BP 132/78 | HR 64 | Temp 98.1°F | Ht 69.0 in | Wt 176.8 lb

## 2022-07-06 DIAGNOSIS — G4733 Obstructive sleep apnea (adult) (pediatric): Secondary | ICD-10-CM | POA: Diagnosis not present

## 2022-07-06 NOTE — Patient Instructions (Signed)
  X Split night study

## 2022-07-06 NOTE — Assessment & Plan Note (Signed)
Given his 60 pound weight loss it is possible that his OSA has improved.  But he remains symptomatic.  I have asked him to resume his CPAP usage and we will provide him with CPAP supplies. We will reassess with a split-night study given that he had few central apneas in his original study.  Based on this and review of his download from his machine, we may suggest changes as needed

## 2022-07-06 NOTE — Progress Notes (Signed)
Subjective:    Patient ID: [name], male    DOB: 1971-03-23, 51 y.o.   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   PMH -hypertension requiring 3 medications CKD stage IV  He was diagnosed with OSA in 2017 and provided with a CPAP machine.  He settled down with a fullface mask initially and this seemed to help and improve his somnolence and fatigue.  However after using this for some time he stopped using this for no clear reason.  He states that he probably did not take his diagnosis seriously. He now reports excessive daytime fatigue in spite of adequate quality of sleep Epworth Sleepiness Scale is 13 and he reports sleepiness while watching TV, lying down to rest in the afternoons or sitting quietly after lunch  Bedtime is between 10 and 11 PM, sleep latency is minimal, reports 3-4 nocturnal awakenings including nocturia he states that he tosses and turns through the night, and is out of bed by 7:30 AM feeling groggy with occasional headaches There is no history suggestive of cataplexy, sleep paralysis or parasomnias  He has lost about 60 pounds since his original study to his current weight of 175 pounds  BUN/creatinine on 06/23/2022 was 31/2.9  Significant tests/ events reviewed  01/2016 NPSG -240 pounds-TST 3 1 6  minutes, AHI 25/hour central 7/hour, lowest desaturation 85%,   Past Medical History:  Diagnosis Date   Acute kidney injury ([place])    Bilateral renal cysts 02/23/2014   Noted in 2013 initially    Chronic back pain    Depression, major, single episode, severe ([place]) 08/13/2018   pHQ 9 score of 24, not suicidal or homicidal, score of 8 in 09/2018, no medication or therapy as of this time   Duodenitis with bleeding 08/2008   Ulcer - admitted to [facility] hypertension, benign    GERD (gastroesophageal reflux disease)    Headache(784.0)    Hyperlipidemia    Hypertensive urgency 10/08/2019   Malignant  hypertension 10/09/2007        Migraines    Nonspecific abnormal electrocardiogram (ECG) (EKG) 08/30/2016   Obesity, unspecified    Sleep apnea    Stress and adjustment reaction 11/04/2016    Past Surgical History:  Procedure Laterality Date   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   Procedure: 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.;  Surgeon: Jolyn Nap, MD;  Location: [facility];  Service: Orthopedics;  Laterality: Left;   CHOLECYSTECTOMY N/A 10/03/2018   Procedure: LAPAROSCOPIC CHOLECYSTECTOMY;  Surgeon: Aviva Signs, MD;  Location: [facility];  Service: General;  Laterality: N/A;   FINGER DEBRIDEMENT Left 09/25/2013   Thumb   FOOT FRACTURE SURGERY Right     All  Social History   Socioeconomic History   Marital status: Married    Spouse name: Not on file   Number of children: 4   Years of education: Not on file   Highest education level: Not on file  Occupational History   Occupation: Musician     Comment: [employer]  Tobacco Use   Smoking status: Former    Packs/day: 0.10    Years: 15.00    Total pack years: 1.50    Types: Cigarettes    Quit date: 09/15/2012    Years since quitting: 9.8   Smokeless tobacco: Never  Vaping Use   Vaping Use: Never used  Substance and Sexual Activity   Alcohol use:  No   Drug use: No   Sexual activity: Yes  Other Topics Concern   Not on file  Social History Narrative   Lives with his family, work part time at [employer].    Social Determinants of Health   Financial Resource Strain: Low Risk  (06/25/2019)   Overall Financial Resource Strain (CARDIA)    Difficulty of Paying Living Expenses: Not very hard  Food Insecurity: No Food Insecurity (06/25/2019)   Hunger Vital Sign    Worried About Running Out of Food in the Last Year: Never true    Ran Out of Food in the Last Year: Never true  Transportation Needs: No Transportation Needs (06/25/2019)   PRAPARE - Radiographer, therapeutic (Medical): No    Lack of Transportation (Non-Medical): No  Physical Activity: Unknown (06/25/2019)   Exercise Vital Sign    Days of Exercise per Week: Patient refused    Minutes of Exercise per Session: Patient refused  Stress: Not on file  Social Connections: Unknown (06/25/2019)   Social Connection and Isolation Panel [NHANES]    Frequency of Communication with Friends and Family: Patient refused    Frequency of Social Gatherings with Friends and Family: Patient refused    Attends Religious Services: Patient refused    Active Member of Clubs or Organizations: Patient refused    Attends Archivist Meetings: Patient refused    Marital Status: Patient refused  Intimate Partner Violence: Not At Risk (06/25/2019)   Humiliation, Afraid, Rape, and Kick questionnaire    Fear of Current or Ex-Partner: No    Emotionally Abused: No    Physically Abused: No    Sexually Abused: No     Family History  Problem Relation Age of Onset   Diabetes Mother    Hypertension Mother    Hypertension Father    Arthritis Father    Diabetes Father    Colon cancer Neg Hx    Gastric cancer Neg Hx    Esophageal cancer Neg Hx     Review of Systems Constitutional: negative for anorexia, fevers and sweats  Eyes: negative for irritation, redness and visual disturbance  Ears, nose, mouth, throat, and face: negative for earaches, epistaxis, nasal congestion and sore throat  Respiratory: negative for cough, dyspnea on exertion, sputum and wheezing  Cardiovascular: negative for chest pain, dyspnea, lower extremity edema, orthopnea, palpitations and syncope  Gastrointestinal: negative for abdominal pain, constipation, diarrhea, melena, nausea and vomiting  Genitourinary:negative for dysuria, frequency and hematuria  Hematologic/lymphatic: negative for bleeding, easy bruising and lymphadenopathy  Musculoskeletal:negative for arthralgias, muscle weakness and stiff joints  Neurological:  negative for coordination problems, gait problems, headaches and weakness  Endocrine: negative for diabetic symptoms including polydipsia, polyuria and weight loss     Objective:   Physical Exam  Gen. Pleasant, well-nourished, in no distress, normal affect ENT - no pallor,icterus, no post nasal drip Neck: No JVD, no thyromegaly, no carotid bruits Lungs: no use of accessory muscles, no dullness to percussion, clear without rales or rhonchi  Cardiovascular: Rhythm regular, heart sounds  normal, no murmurs or gallops, no peripheral edema Abdomen: soft and non-tender, no hepatosplenomegaly, BS normal. Musculoskeletal: No deformities, no cyanosis or clubbing Neuro:  alert, non focal       Assessment & Plan:

## 2022-07-07 ENCOUNTER — Telehealth: Payer: Self-pay | Admitting: Pulmonary Disease

## 2022-07-07 NOTE — Telephone Encounter (Signed)
Called and spoke to patients wife Lauro Regulus (ok per dpr) and she states that they were wanting to get  a new cpap from [place].  She is agreeable to wait and send order for new CPAP until the patient has his sleep study done. Nothing further needed at this time.

## 2022-08-06 ENCOUNTER — Other Ambulatory Visit: Payer: Self-pay | Admitting: Family Medicine

## 2022-09-09 ENCOUNTER — Other Ambulatory Visit: Payer: Self-pay | Admitting: Family Medicine

## 2022-11-02 ENCOUNTER — Other Ambulatory Visit: Payer: Self-pay

## 2022-11-02 ENCOUNTER — Telehealth: Payer: Self-pay | Admitting: Family Medicine

## 2022-11-02 DIAGNOSIS — N184 Chronic kidney disease, stage 4 (severe): Secondary | ICD-10-CM

## 2022-11-02 DIAGNOSIS — Z13220 Encounter for screening for lipoid disorders: Secondary | ICD-10-CM

## 2022-11-02 DIAGNOSIS — I10 Essential (primary) hypertension: Secondary | ICD-10-CM

## 2022-11-02 DIAGNOSIS — I150 Renovascular hypertension: Secondary | ICD-10-CM

## 2022-11-02 DIAGNOSIS — E559 Vitamin D deficiency, unspecified: Secondary | ICD-10-CM

## 2022-11-02 NOTE — Telephone Encounter (Signed)
Physicians results form   Noted copied sleeved  In providers box, copy in brown folder

## 2022-11-03 ENCOUNTER — Ambulatory Visit: Payer: BC Managed Care – PPO | Admitting: Family Medicine

## 2022-11-04 ENCOUNTER — Encounter: Payer: Self-pay | Admitting: Family Medicine

## 2022-11-04 ENCOUNTER — Ambulatory Visit (INDEPENDENT_AMBULATORY_CARE_PROVIDER_SITE_OTHER): Payer: BC Managed Care – PPO | Admitting: Family Medicine

## 2022-11-04 VITALS — BP 140/84 | HR 63 | Ht 69.0 in | Wt 184.1 lb

## 2022-11-04 DIAGNOSIS — I150 Renovascular hypertension: Secondary | ICD-10-CM

## 2022-11-04 DIAGNOSIS — G479 Sleep disorder, unspecified: Secondary | ICD-10-CM

## 2022-11-04 DIAGNOSIS — Z1211 Encounter for screening for malignant neoplasm of colon: Secondary | ICD-10-CM

## 2022-11-04 DIAGNOSIS — N184 Chronic kidney disease, stage 4 (severe): Secondary | ICD-10-CM

## 2022-11-04 DIAGNOSIS — I119 Hypertensive heart disease without heart failure: Secondary | ICD-10-CM

## 2022-11-04 DIAGNOSIS — I43 Cardiomyopathy in diseases classified elsewhere: Secondary | ICD-10-CM

## 2022-11-04 DIAGNOSIS — Z23 Encounter for immunization: Secondary | ICD-10-CM | POA: Diagnosis not present

## 2022-11-04 DIAGNOSIS — R5383 Other fatigue: Secondary | ICD-10-CM

## 2022-11-04 DIAGNOSIS — Z13220 Encounter for screening for lipoid disorders: Secondary | ICD-10-CM | POA: Diagnosis not present

## 2022-11-04 DIAGNOSIS — G43711 Chronic migraine without aura, intractable, with status migrainosus: Secondary | ICD-10-CM | POA: Diagnosis not present

## 2022-11-04 DIAGNOSIS — I10 Essential (primary) hypertension: Secondary | ICD-10-CM | POA: Diagnosis not present

## 2022-11-04 DIAGNOSIS — E559 Vitamin D deficiency, unspecified: Secondary | ICD-10-CM | POA: Diagnosis not present

## 2022-11-04 DIAGNOSIS — Z139 Encounter for screening, unspecified: Secondary | ICD-10-CM | POA: Diagnosis not present

## 2022-11-04 MED ORDER — CARVEDILOL 3.125 MG PO TABS: 3.1250 mg | ORAL_TABLET | Freq: Two times a day (BID) | ORAL | 3 refills | Status: DC

## 2022-11-04 NOTE — Assessment & Plan Note (Signed)
Not at goal.  Add Coreg 3.125 mg daily. DASH diet and commitment to daily physical activity for a minimum of 30 minutes discussed and encouraged, as a part of hypertension management. The importance of attaining a healthy weight is also discussed.     11/04/2022    9:56 AM 11/04/2022    9:07 AM 11/04/2022    9:04 AM 07/06/2022    8:52 AM 06/23/2022    4:03 PM 03/23/2022    3:33 PM 11/12/2021   12:26 PM  BP/Weight  Systolic BP XX123456 123456 Q000111Q Q000111Q 0000000 [id number] [id number]  Diastolic BP 84 78 88 78 72 82 98  Wt. (Lbs)   184.12 176.8 175 173.04   BMI   27.19 kg/m2 26.11 kg/m2 25.84 kg/m2 22.83 kg/m2

## 2022-11-04 NOTE — Assessment & Plan Note (Signed)
Update lab and be established with nephrology.

## 2022-11-04 NOTE — Progress Notes (Signed)
[name]     MRN: [id number]      DOB: 02-22-1971   HPI [name] is here for follow up and re-evaluation of chronic medical conditions, medication management and review of any available recent lab and radiology data.  Preventive health is updated, specifically  Cancer screening and Immunization.   Has seen pulmonary and a sleep study was set up however he has not followed through on this, he understands that he does need to do this.  He does report that he got a neurology appointment however turned back on the way to the appointment because of severe headache and nausea.  He continues to complain of uncontrolled severe headaches regularly, I encouraged him to actually make and keep an appointment this is new medication which could be very beneficial for him.  He does have an upcoming appointment with nephrology, has not been there for 2 years however intends to reestablish.  He does agree to getting his flu shot today and do Cologuard The PT denies any adverse reactions to current medications since the last visit.     ROS Denies recent fever or chills. Denies sinus pressure, nasal congestion, ear pain or sore throat. Denies chest congestion, productive cough or wheezing. Denies chest pains, palpitations and leg swelling Denies abdominal pain, nausea, vomiting,diarrhea or constipation.   Denies dysuria, frequency, hesitancy or incontinence. Denies joint pain, swelling and limitation in mobility. Denies depression, anxiety or insomnia. Denies skin break down or rash.   PE  BP (!) 140/84   Pulse 63   Ht 5' 9"$  (1.753 m)   Wt 184 lb 1.9 oz (83.5 kg)   SpO2 97%   BMI 27.19 kg/m   Patient alert and oriented and in no cardiopulmonary distress.  HEENT: No facial asymmetry, EOMI,     Neck supple .  Chest: Clear to auscultation bilaterally.  CVS: S1, S2 no murmurs, no S3.Regular rate.  ABD: Soft non tender.   Ext: No edema  MS: Adequate ROM spine, shoulders, hips  and knees.  Skin: Intact, no ulcerations or rash noted.  Psych: Good eye contact, normal affect. Memory intact not anxious or depressed appearing.  CNS: CN 2-12 intact, power,  normal throughout.no focal deficits noted.   Assessment & Plan  Hypertension Not at goal.  Add Coreg 3.125 mg daily. DASH diet and commitment to daily physical activity for a minimum of 30 minutes discussed and encouraged, as a part of hypertension management. The importance of attaining a healthy weight is also discussed.     11/04/2022    9:56 AM 11/04/2022    9:07 AM 11/04/2022    9:04 AM 07/06/2022    8:52 AM 06/23/2022    4:03 PM 03/23/2022    3:33 PM 11/12/2021   12:26 PM  BP/Weight  Systolic BP XX123456 123456 Q000111Q Q000111Q 0000000 [id number] 123456  Diastolic BP 84 78 88 78 72 82 98  Wt. (Lbs)   184.12 176.8 175 173.04   BMI   27.19 kg/m2 26.11 kg/m2 25.84 kg/m2 22.83 kg/m2        Hypertensive cardiomyopathy (HCC)  Refer to Cardiology  for reevaluation of hypertensive cardiomyopathy.  Last echocardiogram was over 86 years old.  CKD (chronic kidney disease) stage 4, GFR 15-29 ml/min (HCC) Update lab and be established with nephrology.    Fatigue due to sleep pattern disturbance Patient needs to follow through with a sleep study, he does report snoring and has severe hypertension.  I have advised that he  use.  Migraines Uncontrolled migraines chronic disabling certainly weekly and probably biweekly headaches.  Referred to  neurology importance of keeping appointment is stressed.

## 2022-11-04 NOTE — Patient Instructions (Signed)
Follow-up to reevaluate blood pressure in 6 to 8 weeks, call if you need me sooner.  New additional medicine for blood pressure is Coreg 3.125 mg 2 times daily.  Continue your current medications as before.  Goal for blood pressure is less than 130/80.  Labs already ordered to be drawn today.  Nurse please order Cologuard test.  Flu vaccine today.  You are being referred to Neurology regarding uncontrolled headaches, it is very important to keep the appointment.  You need to help.  Please follow through with the sleep study, untreated sleep apnea damages heart lungs and brain and increases your risk of illness and death.  Thanks for choosing [facility], we consider it a privelige to serve you.

## 2022-11-04 NOTE — Assessment & Plan Note (Addendum)
Uncontrolled migraines chronic disabling certainly weekly and probably biweekly headaches.  Referred to  neurology importance of keeping appointment is stressed.

## 2022-11-04 NOTE — Assessment & Plan Note (Signed)
Refer to Cardiology  for reevaluation of hypertensive cardiomyopathy.  Last echocardiogram was over 52 years old.

## 2022-11-04 NOTE — Assessment & Plan Note (Signed)
Patient needs to follow through with a sleep study, he does report snoring and has severe hypertension.  I have advised that he use.

## 2022-11-05 LAB — CBC
Hematocrit: 32.7 % — ABNORMAL LOW (ref 37.5–51.0)
Hemoglobin: 11.1 g/dL — ABNORMAL LOW (ref 13.0–17.7)
MCH: 34.4 pg — ABNORMAL HIGH (ref 26.6–33.0)
MCHC: 33.9 g/dL (ref 31.5–35.7)
MCV: 101 fL — ABNORMAL HIGH (ref 79–97)
Platelets: 249 10*3/uL (ref 150–450)
RBC: 3.23 x10E6/uL — ABNORMAL LOW (ref 4.14–5.80)
RDW: 11.5 % — ABNORMAL LOW (ref 11.6–15.4)
WBC: 4.3 10*3/uL (ref 3.4–10.8)

## 2022-11-05 LAB — CMP14+EGFR
ALT: 8 IU/L (ref 0–44)
AST: 10 IU/L (ref 0–40)
Albumin/Globulin Ratio: 2.3 — ABNORMAL HIGH (ref 1.2–2.2)
Albumin: 4.5 g/dL (ref 3.8–4.9)
Alkaline Phosphatase: 80 IU/L (ref 44–121)
BUN/Creatinine Ratio: 11 (ref 9–20)
BUN: 29 mg/dL — ABNORMAL HIGH (ref 6–24)
Bilirubin Total: 0.5 mg/dL (ref 0.0–1.2)
CO2: 22 mmol/L (ref 20–29)
Calcium: 9.1 mg/dL (ref 8.7–10.2)
Chloride: 105 mmol/L (ref 96–106)
Creatinine, Ser: 2.57 mg/dL — ABNORMAL HIGH (ref 0.76–1.27)
Globulin, Total: 2 g/dL (ref 1.5–4.5)
Glucose: 77 mg/dL (ref 70–99)
Potassium: 4.8 mmol/L (ref 3.5–5.2)
Sodium: 142 mmol/L (ref 134–144)
Total Protein: 6.5 g/dL (ref 6.0–8.5)
eGFR: 29 mL/min/{1.73_m2} — ABNORMAL LOW (ref >59)

## 2022-11-05 LAB — LIPID PANEL
Chol/HDL Ratio: 2.2 ratio (ref 0.0–5.0)
Cholesterol, Total: 106 mg/dL (ref 100–199)
HDL: 49 mg/dL (ref >39)
LDL Chol Calc (NIH): 44 mg/dL (ref 0–99)
Triglycerides: 58 mg/dL (ref 0–149)
VLDL Cholesterol Cal: 13 mg/dL (ref 5–40)

## 2022-11-05 LAB — VITAMIN D 25 HYDROXY (VIT D DEFICIENCY, FRACTURES): Vit D, 25-Hydroxy: 28.4 ng/mL — ABNORMAL LOW (ref 30.0–100.0)

## 2022-11-07 ENCOUNTER — Other Ambulatory Visit: Payer: Self-pay

## 2022-11-07 DIAGNOSIS — I5032 Chronic diastolic (congestive) heart failure: Secondary | ICD-10-CM | POA: Diagnosis not present

## 2022-11-07 DIAGNOSIS — I150 Renovascular hypertension: Secondary | ICD-10-CM

## 2022-11-07 DIAGNOSIS — N184 Chronic kidney disease, stage 4 (severe): Secondary | ICD-10-CM | POA: Diagnosis not present

## 2022-11-07 DIAGNOSIS — G4733 Obstructive sleep apnea (adult) (pediatric): Secondary | ICD-10-CM | POA: Diagnosis not present

## 2022-11-07 DIAGNOSIS — D631 Anemia in chronic kidney disease: Secondary | ICD-10-CM | POA: Diagnosis not present

## 2022-11-08 ENCOUNTER — Other Ambulatory Visit (HOSPITAL_COMMUNITY): Payer: Self-pay | Admitting: Nephrology

## 2022-11-08 DIAGNOSIS — I129 Hypertensive chronic kidney disease with stage 1 through stage 4 chronic kidney disease, or unspecified chronic kidney disease: Secondary | ICD-10-CM

## 2022-11-08 DIAGNOSIS — R809 Proteinuria, unspecified: Secondary | ICD-10-CM

## 2022-11-08 DIAGNOSIS — N184 Chronic kidney disease, stage 4 (severe): Secondary | ICD-10-CM

## 2022-11-09 LAB — VITAMIN B12: Vitamin B-12: 427 pg/mL (ref 232–1245)

## 2022-11-09 LAB — SPECIMEN STATUS REPORT

## 2022-11-09 LAB — IRON: Iron: 99 ug/dL (ref 38–169)

## 2022-11-09 LAB — FOLATE: Folate: 12.2 ng/mL (ref >3.0)

## 2022-11-15 ENCOUNTER — Encounter: Payer: BC Managed Care – PPO | Admitting: Family Medicine

## 2022-11-22 ENCOUNTER — Ambulatory Visit (HOSPITAL_COMMUNITY)
Admission: RE | Admit: 2022-11-22 | Discharge: 2022-11-22 | Disposition: A | Discharge status: Home / Self Care | Payer: BC Managed Care – PPO | Source: Ambulatory Visit | Attending: Nephrology | Admitting: Nephrology

## 2022-11-22 DIAGNOSIS — I129 Hypertensive chronic kidney disease with stage 1 through stage 4 chronic kidney disease, or unspecified chronic kidney disease: Secondary | ICD-10-CM | POA: Diagnosis not present

## 2022-11-22 DIAGNOSIS — D631 Anemia in chronic kidney disease: Secondary | ICD-10-CM | POA: Diagnosis not present

## 2022-11-22 DIAGNOSIS — R809 Proteinuria, unspecified: Secondary | ICD-10-CM

## 2022-11-22 DIAGNOSIS — N281 Cyst of kidney, acquired: Secondary | ICD-10-CM | POA: Diagnosis not present

## 2022-11-22 DIAGNOSIS — N184 Chronic kidney disease, stage 4 (severe): Secondary | ICD-10-CM | POA: Diagnosis not present

## 2022-12-22 ENCOUNTER — Ambulatory Visit: Payer: BC Managed Care – PPO | Admitting: Family Medicine

## 2022-12-29 ENCOUNTER — Encounter: Payer: Self-pay | Admitting: Internal Medicine

## 2022-12-29 ENCOUNTER — Ambulatory Visit: Payer: BC Managed Care – PPO | Attending: Internal Medicine | Admitting: Internal Medicine

## 2022-12-29 VITALS — BP 104/60 | HR 59 | Ht 72.0 in | Wt 181.0 lb

## 2022-12-29 DIAGNOSIS — I10 Essential (primary) hypertension: Secondary | ICD-10-CM | POA: Diagnosis not present

## 2022-12-29 DIAGNOSIS — R4189 Other symptoms and signs involving cognitive functions and awareness: Secondary | ICD-10-CM | POA: Diagnosis not present

## 2022-12-29 MED ORDER — CLONIDINE HCL 0.2 MG/24HR TD PTWK: 0.2000 mg | MEDICATED_PATCH | TRANSDERMAL | 12 refills | Status: DC

## 2022-12-29 MED ORDER — ISOSORBIDE DINITRATE 20 MG PO TABS: 20.0000 mg | ORAL_TABLET | Freq: Two times a day (BID) | ORAL | 3 refills | Status: DC | PRN

## 2022-12-29 NOTE — Patient Instructions (Addendum)
Medication Instructions:  Your physician has recommended you make the following change in your medication:   -Stop Spironolactone -Decrease Clonidine to 0.2 mg daily -Start Isordil 20 mg twice daily as needed for blood pressure over 130/90  *If you need a refill on your cardiac medications before your next appointment, please call your pharmacy*   Lab Work: None If you have labs (blood work) drawn today and your tests are completely normal, you will receive your results only by: MyChart Message (if you have MyChart) OR A paper copy in the mail If you have any lab test that is abnormal or we need to change your treatment, we will call you to review the results.   Testing/Procedures: None   Follow-Up: At [facility], you and your health needs are our priority.  As part of our continuing mission to provide you with exceptional heart care, we have created designated Provider Care Teams.  These Care Teams include your primary Cardiologist (physician) and Advanced Practice Providers (APPs -  Physician Assistants and Nurse Practitioners) who all work together to provide you with the care you need, when you need it.  We recommend signing up for the patient portal called "MyChart".  Sign up information is provided on this After Visit Summary.  MyChart is used to connect with patients for Virtual Visits (Telemedicine).  Patients are able to view lab/test results, encounter notes, upcoming appointments, etc.  Non-urgent messages can be sent to your provider as well.   To learn more about what you can do with MyChart, go to [email or web address].    Your next appointment:   1 month(s)  Provider:   Luane School, MD    Other Instructions

## 2022-12-29 NOTE — Progress Notes (Addendum)
Cardiology Office Note  Date: 12/29/2022   ID: [name], DOB 06-Aug-1971, MRN [id number]  PCP:  Kerri Perches, MD  Cardiologist:  Prentice Docker, MD (Inactive) Electrophysiologist:  None   Reason for Office Visit: Management of HTN at the request of Dr. Lodema Hong   History of Present Illness: [name] is a 52 y.o. male known to have HTN, CKD stage IV, HLD was referred to cardiology clinic for management of HTN.  Patient does not check his blood pressures at home. Today his blood pressure is controlled. On clonidine 0.3 mg patch once a week, it makes him feel sleepy, drowsy and sluggish.  Denies any other symptoms of angina, DOE, dizziness/syncope and leg swelling.  Past Medical History:  Diagnosis Date   Acute kidney injury (HCC)    Bilateral renal cysts 02/23/2014   Noted in 2013 initially    Chronic back pain    Depression, major, single episode, severe (HCC) 08/13/2018   pHQ 9 score of 24, not suicidal or homicidal, score of 8 in 09/2018, no medication or therapy as of this time   Duodenitis with bleeding 08/2008   Ulcer - admitted to [facility]    Essential hypertension, benign    GERD (gastroesophageal reflux disease)    Headache(784.0)    Hyperlipidemia    Hypertensive urgency 10/08/2019   Malignant hypertension 10/09/2007        Migraines    Nonspecific abnormal electrocardiogram (ECG) (EKG) 08/30/2016   Obesity, unspecified    Sleep apnea    Stress and adjustment reaction 11/04/2016    Past Surgical History:  Procedure Laterality Date   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   Procedure: 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.;  Surgeon: Jodi Marble, MD;  Location: [facility] OR;  Service: Orthopedics;  Laterality: Left;   CHOLECYSTECTOMY N/A 10/03/2018   Procedure: LAPAROSCOPIC CHOLECYSTECTOMY;  Surgeon: Franky Macho, MD;  Location: [facility] ORS;  Service: General;  Laterality: N/A;   FINGER DEBRIDEMENT Left 09/25/2013    Thumb   FOOT FRACTURE SURGERY Right     Current Outpatient Medications  Medication Sig Dispense Refill   amLODipine (NORVASC) 10 MG tablet TAKE 1 TABLET BY MOUTH IN THE MORNING AND TAKE 1 TABLET BY MOUTH AT 8 PM FOR BLOOD PRESSURE 180 tablet 1   carvedilol (COREG) 3.125 MG tablet Take 1 tablet (3.125 mg total) by mouth 2 (two) times daily with a meal. 60 tablet 3   Cholecalciferol (VITAMIN D3) 25 MCG (1000 UT) CAPS Take 1 capsule (1,000 Units total) by mouth daily. 60 capsule 3   cloNIDine (CATAPRES - DOSED IN MG/24 HR) 0.3 mg/24hr patch APPLY 1 PATCH ON THE SKIN ONCE A WEEK 12 patch 1   esomeprazole (NEXIUM) 20 MG capsule Take 1 capsule (20 mg total) by mouth 2 (two) times daily before a meal. (Patient taking differently: Take 20 mg by mouth daily before breakfast.) 60 capsule 3   hydrOXYzine (VISTARIL) 50 MG capsule TAKE 1 CAPSULE BY MOUTH TWICE A DAY AS NEEDED FOR ANXIETY 60 capsule 2   ondansetron (ZOFRAN) 4 MG tablet Take 1 tablet (4 mg total) by mouth every 6 (six) hours as needed for nausea or vomiting. 20 tablet 0   spironolactone (ALDACTONE) 25 MG tablet TAKE 1 TABLET BY MOUTH TWICE A DAY 180 tablet 1   No current facility-administered medications for this visit.   Allergies:  Bee venom, Prochlorperazine edisylate, Cyclobenzaprine hcl, Aleve [naproxen sodium], Carbamazepine, Geodon [ziprasidone hcl],  Ibuprofen, Maxzide [hydrochlorothiazide w-triamterene], Nsaids, Tylenol [acetaminophen], and Other   Social History: The patient  reports that he quit smoking about 10 years ago. His smoking use included cigarettes. He has a 1.50 pack-year smoking history. He has never used smokeless tobacco. He reports that he does not drink alcohol and does not use drugs.   Family History: The patient's family history includes Arthritis in his father; Diabetes in his father and mother; Hypertension in his father and mother.   ROS:  Please see the history of present illness. Otherwise, complete review of  systems is positive for none.  All other systems are reviewed and negative.   Physical Exam: VS:  There were no vitals taken for this visit., BMI There is no height or weight on file to calculate BMI.  Wt Readings from Last 3 Encounters:  11/04/22 184 lb 1.9 oz (83.5 kg)  07/06/22 176 lb 12.8 oz (80.2 kg)  06/23/22 175 lb (79.4 kg)    General: Patient appears comfortable at rest. HEENT: Conjunctiva and lids normal, oropharynx clear with moist mucosa. Neck: Supple, no elevated JVP or carotid bruits, no thyromegaly. Lungs: Clear to auscultation, nonlabored breathing at rest. Cardiac: Regular rate and rhythm, no S3 or significant systolic murmur, no pericardial rub. Abdomen: Soft, nontender, no hepatomegaly, bowel sounds present, no guarding or rebound. Extremities: No pitting edema, distal pulses 2+. Skin: Warm and dry. Musculoskeletal: No kyphosis. Neuropsychiatric: Alert and oriented x3, affect grossly appropriate.  ECG:  NSR  Recent Labwork: 11/04/2022: ALT 8; AST 10; BUN 29; Creatinine, Ser 2.57; Hemoglobin 11.1; Platelets 249; Potassium 4.8; Sodium 142     Component Value Date/Time   CHOL 106 11/04/2022 1020   TRIG 58 11/04/2022 1020   HDL 49 11/04/2022 1020   CHOLHDL 2.2 11/04/2022 1020   CHOLHDL 2.6 11/08/2018 1017   VLDL 13 09/22/2015 0814   LDLCALC 44 11/04/2022 1020   LDLCALC 54 11/08/2018 1017    Other Studies Reviewed Today:   Assessment and Plan: Patient is a 52 year old M known to have HTN, HLD, CKD stage IV was referred to cardiology clinic for management of HTN.  # Resistant HTN c/w CKD IV # Sluggishness from Clonidine use -Will try to wean off clonidine due to symptoms of sluggishness and drowsiness. Decrease the dose of clonidine from 0.3 mg to 0.2 mg weekly patches. Discontinue spironolactone and add Isordil 20 mg twice daily.  Instructed patient to check his blood pressures at home, in a.m. and p.m., keep a log and bring it to the next clinic visit.  He  voiced understanding.  Otherwise, continue remaining antihypertensive medications like amlodipine 10 mg once daily, carvedilol 3.125 mg twice daily and hydralazine 50 mg twice daily. -He is also scheduled for OSA evaluation as outpatient. -He underwent secondary causes of HTN workup in the past, plasma metanephrines, renal Doppler turned out to be negative.  # CKD stage IV: Continue follow-up with nephrology, hydralazine was started by his nephrologist.  I have spent a total of 45 minutes with patient reviewing chart, EKGs, labs and examining patient as well as establishing an assessment and plan that was discussed with the patient.  > 50% of time was spent in direct patient care.     Medication Adjustments/Labs and Tests Ordered: Current medicines are reviewed at length with the patient today.  Concerns regarding medicines are outlined above.   Tests Ordered: No orders of the defined types were placed in this encounter.   Medication Changes: Meds ordered this encounter  Medications   cloNIDine (CATAPRES - DOSED IN MG/24 HR) 0.2 mg/24hr patch    Sig: Place 1 patch (0.2 mg total) onto the skin once a week.    Dispense:  4 patch    Refill:  12   isosorbide dinitrate (ISORDIL) 20 MG tablet    Sig: Take 1 tablet (20 mg total) by mouth 2 (two) times daily as needed (Take only if blood pressure is above 130/90).    Dispense:  90 tablet    Refill:  3      Disposition:  Follow up  1 month  Signed, Railey Glad Verne Spurr, MD, 12/29/2022 10:46 AM    [facility] [address], [place], [place] [address]

## 2023-01-03 ENCOUNTER — Telehealth: Payer: Self-pay | Admitting: *Deleted

## 2023-01-03 NOTE — Telephone Encounter (Signed)
Patient called and states that he has not received information for a sleep study. Last seen for a sleep consult 06/2022 with Dr. Vassie Loll. Patient would like a call back with an update. [phone]

## 2023-01-05 ENCOUNTER — Telehealth: Payer: Self-pay | Admitting: Pulmonary Disease

## 2023-01-05 NOTE — Telephone Encounter (Signed)
Printed and gave to [employer] for [employer]

## 2023-01-05 NOTE — Telephone Encounter (Signed)
PT wife calling. Split Night sleep study ordered last October. They state no one has called them./ Pls call wife to advise @ [phone]

## 2023-01-06 NOTE — Telephone Encounter (Signed)
Called wife and left vm

## 2023-01-09 NOTE — Telephone Encounter (Signed)
Notified patient and wife about split night study

## 2023-01-12 ENCOUNTER — Ambulatory Visit: Payer: BC Managed Care – PPO | Admitting: Family Medicine

## 2023-01-13 ENCOUNTER — Encounter: Payer: Self-pay | Admitting: Family Medicine

## 2023-01-26 ENCOUNTER — Emergency Department (HOSPITAL_COMMUNITY)
Admission: EM | Admit: 2023-01-26 | Discharge: 2023-01-26 | Disposition: A | Discharge status: Home / Self Care | Payer: BC Managed Care – PPO | Attending: Emergency Medicine | Admitting: Emergency Medicine

## 2023-01-26 ENCOUNTER — Other Ambulatory Visit: Payer: Self-pay

## 2023-01-26 ENCOUNTER — Encounter (HOSPITAL_COMMUNITY): Payer: Self-pay

## 2023-01-26 DIAGNOSIS — Z79899 Other long term (current) drug therapy: Secondary | ICD-10-CM | POA: Diagnosis not present

## 2023-01-26 DIAGNOSIS — I10 Essential (primary) hypertension: Secondary | ICD-10-CM | POA: Diagnosis not present

## 2023-01-26 DIAGNOSIS — I129 Hypertensive chronic kidney disease with stage 1 through stage 4 chronic kidney disease, or unspecified chronic kidney disease: Secondary | ICD-10-CM | POA: Insufficient documentation

## 2023-01-26 DIAGNOSIS — I1A0 Resistant hypertension: Secondary | ICD-10-CM | POA: Insufficient documentation

## 2023-01-26 DIAGNOSIS — N184 Chronic kidney disease, stage 4 (severe): Secondary | ICD-10-CM | POA: Insufficient documentation

## 2023-01-26 DIAGNOSIS — R112 Nausea with vomiting, unspecified: Secondary | ICD-10-CM

## 2023-01-26 LAB — CBC WITH DIFFERENTIAL/PLATELET
Abs Immature Granulocytes: 0.02 10*3/uL (ref 0.00–0.07)
Basophils Absolute: 0 10*3/uL (ref 0.0–0.1)
Basophils Relative: 0 %
Eosinophils Absolute: 0 10*3/uL (ref 0.0–0.5)
Eosinophils Relative: 0 %
HCT: 34.1 % — ABNORMAL LOW (ref 39.0–52.0)
Hemoglobin: 11.7 g/dL — ABNORMAL LOW (ref 13.0–17.0)
Immature Granulocytes: 0 %
Lymphocytes Relative: 8 %
Lymphs Abs: 0.5 10*3/uL — ABNORMAL LOW (ref 0.7–4.0)
MCH: 34.4 pg — ABNORMAL HIGH (ref 26.0–34.0)
MCHC: 34.3 g/dL (ref 30.0–36.0)
MCV: 100.3 fL — ABNORMAL HIGH (ref 80.0–100.0)
Monocytes Absolute: 0.2 10*3/uL (ref 0.1–1.0)
Monocytes Relative: 3 %
Neutro Abs: 5.6 10*3/uL (ref 1.7–7.7)
Neutrophils Relative %: 89 %
Platelets: 242 10*3/uL (ref 150–400)
RBC: 3.4 MIL/uL — ABNORMAL LOW (ref 4.22–5.81)
RDW: 11.1 % — ABNORMAL LOW (ref 11.5–15.5)
WBC: 6.4 10*3/uL (ref 4.0–10.5)
nRBC: 0 % (ref 0.0–0.2)

## 2023-01-26 LAB — COMPREHENSIVE METABOLIC PANEL
ALT: 11 U/L (ref 0–44)
AST: 18 U/L (ref 15–41)
Albumin: 4.6 g/dL (ref 3.5–5.0)
Alkaline Phosphatase: 65 U/L (ref 38–126)
Anion gap: 11 (ref 5–15)
BUN: 30 mg/dL — ABNORMAL HIGH (ref 6–20)
CO2: 23 mmol/L (ref 22–32)
Calcium: 9.2 mg/dL (ref 8.9–10.3)
Chloride: 105 mmol/L (ref 98–111)
Creatinine, Ser: 2.67 mg/dL — ABNORMAL HIGH (ref 0.61–1.24)
GFR, Estimated: 28 mL/min — ABNORMAL LOW (ref >60)
Glucose, Bld: 128 mg/dL — ABNORMAL HIGH (ref 70–99)
Potassium: 3.8 mmol/L (ref 3.5–5.1)
Sodium: 139 mmol/L (ref 135–145)
Total Bilirubin: 0.9 mg/dL (ref 0.3–1.2)
Total Protein: 7.4 g/dL (ref 6.5–8.1)

## 2023-01-26 LAB — TROPONIN I (HIGH SENSITIVITY)
Troponin I (High Sensitivity): 16 ng/L (ref <18)
Troponin I (High Sensitivity): 16 ng/L (ref <18)

## 2023-01-26 MED ORDER — ONDANSETRON 4 MG PO TBDP: 4.0000 mg | ORAL_TABLET | Freq: Three times a day (TID) | ORAL | 0 refills | Status: DC | PRN

## 2023-01-26 MED ORDER — ONDANSETRON HCL 4 MG/2ML IJ SOLN
4.0000 mg | Freq: Once | INTRAMUSCULAR | Status: AC
  Administered 2023-01-26: 4 mg via INTRAVENOUS
  Filled 2023-01-26: qty 2

## 2023-01-26 MED ORDER — CLONIDINE HCL 0.2 MG/24HR TD PTWK: 0.2000 mg | MEDICATED_PATCH | TRANSDERMAL | 12 refills | Status: DC

## 2023-01-26 MED ORDER — HYDRALAZINE HCL 20 MG/ML IJ SOLN
20.0000 mg | INTRAMUSCULAR | Status: AC
  Administered 2023-01-26: 20 mg via INTRAVENOUS
  Filled 2023-01-26: qty 1

## 2023-01-26 NOTE — ED Provider Notes (Signed)
[facility] AT [facility] Provider Note   CSN: [id number] Arrival date & time: 01/26/23  1024     History  Chief Complaint  Patient presents with   Vomiting    [name] is a 52 y.o. male.  HPI   This patient is a 52 year old male, he has a history of hypertension on amlodipine, clonidine patches, hydralazine, Imdur and a history of spironolactone which she thinks was discontinued recently.  He had also been prescribed carvedilol in the past.  The patient is followed by cardiology with Dr. Shirlean Kelly most recently seen on April 11 and is known to have renovascular hypertension and stage IV chronic kidney disease.  He presents today with increasing nausea and vomiting with left-sided chest pain all of which he states started this morning.  He reports running out of his clonidine patch last week and the 1 that is on his back is been there for over a week.  He denies running out of his other medications, denies fevers or chills, denies shortness of breath or swelling.  Home Medications Prior to Admission medications   Medication Sig Start Date End Date Taking? Authorizing Provider  ondansetron (ZOFRAN-ODT) 4 MG disintegrating tablet Take 1 tablet (4 mg total) by mouth every 8 (eight) hours as needed for nausea. 01/26/23  Yes Eber Hong, MD  amLODipine (NORVASC) 10 MG tablet TAKE 1 TABLET BY MOUTH IN THE MORNING AND TAKE 1 TABLET BY MOUTH AT 8 PM FOR BLOOD PRESSURE 08/08/22   Kerri Perches, MD  carvedilol (COREG) 3.125 MG tablet Take 1 tablet (3.125 mg total) by mouth 2 (two) times daily with a meal. 11/04/22   Kerri Perches, MD  Cholecalciferol (VITAMIN D3) 25 MCG (1000 UT) CAPS Take 1 capsule (1,000 Units total) by mouth daily. 11/13/21   Donell Beers, FNP  cloNIDine (CATAPRES - DOSED IN MG/24 HR) 0.2 mg/24hr patch Place 1 patch (0.2 mg total) onto the skin once a week. 01/26/23   Eber Hong, MD  esomeprazole (NEXIUM) 20 MG capsule  Take 1 capsule (20 mg total) by mouth 2 (two) times daily before a meal. Patient taking differently: Take 20 mg by mouth daily before breakfast. 11/27/19   Anice Paganini, NP  hydrALAZINE (APRESOLINE) 50 MG tablet Take 50 mg by mouth in the morning and at bedtime. 11/07/22 11/07/23  [provider]  isosorbide dinitrate (ISORDIL) 20 MG tablet Take 1 tablet (20 mg total) by mouth 2 (two) times daily as needed (Take only if blood pressure is above 130/90). 12/29/22   Mallipeddi, Vishnu P, MD  ondansetron (ZOFRAN) 4 MG tablet Take 1 tablet (4 mg total) by mouth every 6 (six) hours as needed for nausea or vomiting. 06/09/21   Pollina, Canary Brim, MD      Allergies    Bee venom, Prochlorperazine edisylate, Cyclobenzaprine hcl, Aleve [naproxen sodium], Carbamazepine, Geodon [ziprasidone hcl], Ibuprofen, Maxzide [hydrochlorothiazide w-triamterene], Nsaids, Tylenol [acetaminophen], and Other    Review of Systems   Review of Systems  All other systems reviewed and are negative.   Physical Exam Updated Vital Signs BP 126/74   Pulse (!) 54   Temp 98.5 F (36.9 C) (Oral)   Resp 12   Ht 1.778 m (5\' 10" )   Wt 81.6 kg   SpO2 99%   BMI 25.83 kg/m  Physical Exam Vitals and nursing note reviewed.  Constitutional:      General: He is not in acute distress.  Appearance: He is well-developed.  HENT:     Head: Normocephalic and atraumatic.     Mouth/Throat:     Pharynx: No oropharyngeal exudate.  Eyes:     General: No scleral icterus.       Right eye: No discharge.        Left eye: No discharge.     Conjunctiva/sclera: Conjunctivae normal.     Pupils: Pupils are equal, round, and reactive to light.  Neck:     Thyroid: No thyromegaly.     Vascular: No JVD.  Cardiovascular:     Rate and Rhythm: Normal rate and regular rhythm.     Heart sounds: Normal heart sounds. No murmur heard.    No friction rub. No gallop.  Pulmonary:     Effort: Pulmonary effort is normal. No respiratory  distress.     Breath sounds: Normal breath sounds. No wheezing or rales.  Abdominal:     General: Bowel sounds are normal. There is no distension.     Palpations: Abdomen is soft. There is no mass.     Tenderness: There is no abdominal tenderness.  Musculoskeletal:        General: No tenderness. Normal range of motion.     Cervical back: Normal range of motion and neck supple.     Right lower leg: No edema.     Left lower leg: No edema.  Lymphadenopathy:     Cervical: No cervical adenopathy.  Skin:    General: Skin is warm and dry.     Findings: No erythema or rash.  Neurological:     Mental Status: He is alert.     Coordination: Coordination normal.  Psychiatric:        Behavior: Behavior normal.     ED Results / Procedures / Treatments   Labs (all labs ordered are listed, but only abnormal results are displayed) Labs Reviewed  CBC WITH DIFFERENTIAL/PLATELET - Abnormal; Notable for the following components:      Result Value   RBC 3.40 (*)    Hemoglobin 11.7 (*)    HCT 34.1 (*)    MCV 100.3 (*)    MCH 34.4 (*)    RDW 11.1 (*)    Lymphs Abs 0.5 (*)    All other components within normal limits  COMPREHENSIVE METABOLIC PANEL - Abnormal; Notable for the following components:   Glucose, Bld 128 (*)    BUN 30 (*)    Creatinine, Ser 2.67 (*)    GFR, Estimated 28 (*)    All other components within normal limits  TROPONIN I (HIGH SENSITIVITY)  TROPONIN I (HIGH SENSITIVITY)    EKG EKG Interpretation  Date/Time:  Thursday Jan 26 2023 10:54:52 EDT Ventricular Rate:  64 PR Interval:  158 QRS Duration: 104 QT Interval:  421 QTC Calculation: 435 R Axis:   -70 Text Interpretation: Sinus rhythm LAD, consider left anterior fascicular block Probable anteroseptal infarct, old Nonspecific ST abnormality compared with 07/12/21, no sig changes, rate slower Confirmed by Eber Hong ([id number]) on 01/26/2023 12:05:34 PM  Radiology No results found.  Procedures Procedures     Medications Ordered in ED Medications  hydrALAZINE (APRESOLINE) injection 20 mg (20 mg Intravenous Given 01/26/23 1123)  ondansetron (ZOFRAN) injection 4 mg (4 mg Intravenous Given 01/26/23 1121)  ondansetron (ZOFRAN) injection 4 mg (4 mg Intravenous Given 01/26/23 1403)    ED Course/ Medical Decision Making/ A&P  Medical Decision Making Amount and/or Complexity of Data Reviewed Labs: ordered.  Risk Prescription drug management.    This patient presents to the ED for concern of nausea vomiting and chest pain, this involves an extensive number of treatment options, and is a complaint that carries with it a high risk of complications and morbidity.  The differential diagnosis includes hypertensive crisis, primary coronary disease, worsening renal function   Co morbidities that complicate the patient evaluation  Chronic kidney disease, severe renovascular hypertension   Additional history obtained:  Additional history obtained from medical record External records from outside source obtained and reviewed including office notes   Lab Tests:  I Ordered, and personally interpreted labs.  The pertinent results include: Troponin as well as the repeat troponin were both unremarkable   Cardiac Monitoring: / EKG:  The patient was maintained on a cardiac monitor.  I personally viewed and interpreted the cardiac monitored which showed an underlying rhythm of: Normal sinus rhythm, mild sinus bradycardia    Problem List / ED Course / Critical interventions / Medication management  The patient improved with medication for nausea and hypertension, blood pressure has improved significantly to last measured at 126/74.  He requested a second dose of Zofran but states he is ready to go home, agreeable to a refill of his clonidine patch I ordered medication including Zofran and hydralazine for hypertension and nausea Reevaluation of the patient after these  medicines showed that the patient improved I have reviewed the patients home medicines and have made adjustments as needed   Social Determinants of Health:  Significant uncontrolled hypertension, noted to have renal insufficiency on labs which seems to be at baseline   Test / Admission - Considered:  Considered admission but the patient improved significantly, wants to go home and I think this is reasonable at this time given his correction of vital signs and symptoms, with 2 negative troponins and a negative EKG I doubt that this is an acute coronary syndrome and there is no neurologic symptoms to suggest this is an acute stroke.         Final Clinical Impression(s) / ED Diagnoses Final diagnoses:  Resistant hypertension  Nausea and vomiting, unspecified vomiting type    Rx / DC Orders ED Discharge Orders          Ordered    ondansetron (ZOFRAN-ODT) 4 MG disintegrating tablet  Every 8 hours PRN        01/26/23 1409    cloNIDine (CATAPRES - DOSED IN MG/24 HR) 0.2 mg/24hr patch  Weekly        01/26/23 1409              Eber Hong, MD 01/26/23 1411

## 2023-01-26 NOTE — Discharge Instructions (Signed)
Please make sure that you are using the clonidine patch that I have prescribed for you, change this once a week, apply it today and make sure that you are taking all of your other medications.  I have offered you refills on your medications but you said that you have all of the medicines you need except for the clonidine.  Please see your family doctor within 3 days for recheck of your blood pressure, you may take Zofran every 6 hours as needed for nausea, ER for worsening symptoms.  Thank you for allowing [place] to treat you in the emergency department today.  After reviewing your examination and potential testing that was done it appears that you are safe to go home.  I would like for you to follow-up with your doctor within the next several days, have them obtain your results and follow-up with them to review all of these tests.  If you should develop severe or worsening symptoms return to the emergency department immediately

## 2023-01-26 NOTE — ED Triage Notes (Signed)
Pt presents with vomiting and left sided chest pain that started this morning, states pain is dull and "feels like something sitting on my chest" and radiates into left shoulder. BP 173/110 in triage

## 2023-01-27 ENCOUNTER — Telehealth: Payer: Self-pay | Admitting: Family Medicine

## 2023-01-27 NOTE — Telephone Encounter (Signed)
error 

## 2023-01-30 ENCOUNTER — Encounter: Payer: Self-pay | Admitting: Internal Medicine

## 2023-01-30 ENCOUNTER — Ambulatory Visit: Payer: BC Managed Care – PPO | Attending: Internal Medicine | Admitting: Internal Medicine

## 2023-01-30 VITALS — BP 128/68 | HR 84 | Ht 72.0 in | Wt 175.0 lb

## 2023-01-30 DIAGNOSIS — I1A0 Resistant hypertension: Secondary | ICD-10-CM | POA: Diagnosis not present

## 2023-01-30 MED ORDER — ISOSORBIDE DINITRATE 20 MG PO TABS: 20.0000 mg | ORAL_TABLET | Freq: Two times a day (BID) | ORAL | 3 refills | Status: AC

## 2023-01-30 NOTE — Progress Notes (Signed)
Cardiology Office Note  Date: 01/30/2023   ID: [name], DOB [birth year], MRN [id number]  PCP:  Kerri Perches, MD  Cardiologist:  Marjo Bicker, MD Electrophysiologist:  None   Reason for Office Visit: Management of HTN at the request of Dr. Lodema Hong   History of Present Illness: [name] is a 52 y.o. male known to have HTN, CKD stage IV, HLD is here for the management of HTN.  Patient does not check his blood pressures at home. Today his blood pressure is controlled. On clonidine 0.2 mg patch once a week (weaned off from 0.3 mg which resolved his sluggishness/drowsiness). Denies any other symptoms of angina, DOE, dizziness/syncope and leg swelling. Her ER visit on 01/26/2023 and tells me that he was upset the night before which probably might have led to elevated blood pressures.  I reviewed the ER BPs that showed 170/100 mmHg upon arrival to the ER on 01/26/2023.  He also had symptoms of severe headache with nausea and vomiting.  He reports that he has frequent headaches.  He is scheduled for OSA testing on 02/17/2023.  Secondary causes of HTN workup in the past was negative, plasma metanephrines was negative and renal artery duplex was also negative.  Past Medical History:  Diagnosis Date   Acute kidney injury (HCC)    Bilateral renal cysts 02/23/2014   Noted in 2013 initially    Chronic back pain    Depression, major, single episode, severe (HCC) 08/13/2018   pHQ 9 score of 24, not suicidal or homicidal, score of 8 in 09/2018, no medication or therapy as of this time   Duodenitis with bleeding 08/2008   Ulcer - admitted to [facility]    Essential hypertension, benign    GERD (gastroesophageal reflux disease)    Headache(784.0)    Hyperlipidemia    Hypertensive urgency 10/08/2019   Malignant hypertension 10/09/2007        Migraines    Nonspecific abnormal electrocardiogram (ECG) (EKG) 08/30/2016   Obesity, unspecified    Sleep apnea    Stress and  adjustment reaction 11/04/2016    Past Surgical History:  Procedure Laterality Date   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   Procedure: 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.;  Surgeon: Jodi Marble, MD;  Location: [facility] OR;  Service: Orthopedics;  Laterality: Left;   CHOLECYSTECTOMY N/A 10/03/2018   Procedure: LAPAROSCOPIC CHOLECYSTECTOMY;  Surgeon: Franky Macho, MD;  Location: [facility] ORS;  Service: General;  Laterality: N/A;   FINGER DEBRIDEMENT Left 09/25/2013   Thumb   FOOT FRACTURE SURGERY Right     Current Outpatient Medications  Medication Sig Dispense Refill   amLODipine (NORVASC) 10 MG tablet TAKE 1 TABLET BY MOUTH IN THE MORNING AND TAKE 1 TABLET BY MOUTH AT 8 PM FOR BLOOD PRESSURE 180 tablet 1   carvedilol (COREG) 3.125 MG tablet Take 1 tablet (3.125 mg total) by mouth 2 (two) times daily with a meal. 60 tablet 3   Cholecalciferol (VITAMIN D3) 25 MCG (1000 UT) CAPS Take 1 capsule (1,000 Units total) by mouth daily. 60 capsule 3   cloNIDine (CATAPRES - DOSED IN MG/24 HR) 0.2 mg/24hr patch Place 1 patch (0.2 mg total) onto the skin once a week. 4 patch 12   esomeprazole (NEXIUM) 20 MG capsule Take 1 capsule (20 mg total) by mouth 2 (two) times daily before a meal. (Patient taking differently: Take 20 mg by mouth daily before breakfast.) 60 capsule 3  hydrALAZINE (APRESOLINE) 50 MG tablet Take 50 mg by mouth in the morning and at bedtime.     ondansetron (ZOFRAN) 4 MG tablet Take 1 tablet (4 mg total) by mouth every 6 (six) hours as needed for nausea or vomiting. 20 tablet 0   ondansetron (ZOFRAN-ODT) 4 MG disintegrating tablet Take 1 tablet (4 mg total) by mouth every 8 (eight) hours as needed for nausea. 10 tablet 0   isosorbide dinitrate (ISORDIL) 20 MG tablet Take 1 tablet (20 mg total) by mouth 2 (two) times daily. 90 tablet 3   No current facility-administered medications for this visit.   Allergies:  Bee venom, Prochlorperazine edisylate, Cyclobenzaprine  hcl, Aleve [naproxen sodium], Carbamazepine, Geodon [ziprasidone hcl], Ibuprofen, Maxzide [hydrochlorothiazide w-triamterene], Nsaids, Tylenol [acetaminophen], and Other   Social History: The patient  reports that he quit smoking about 10 years ago. His smoking use included cigarettes. He has a 1.50 pack-year smoking history. He has never used smokeless tobacco. He reports that he does not drink alcohol and does not use drugs.   Family History: The patient's family history includes Arthritis in his father; Diabetes in his father and mother; Hypertension in his father and mother.   ROS:  Please see the history of present illness. Otherwise, complete review of systems is positive for none.  All other systems are reviewed and negative.   Physical Exam: VS:  BP 128/68   Pulse 84   Ht 6' (1.829 m)   Wt 175 lb (79.4 kg)   SpO2 99%   BMI 23.73 kg/m , BMI Body mass index is 23.73 kg/m.  Wt Readings from Last 3 Encounters:  01/30/23 175 lb (79.4 kg)  01/26/23 180 lb (81.6 kg)  12/29/22 181 lb (82.1 kg)    General: Patient appears comfortable at rest. HEENT: Conjunctiva and lids normal, oropharynx clear with moist mucosa. Neck: Supple, no elevated JVP or carotid bruits, no thyromegaly. Lungs: Clear to auscultation, nonlabored breathing at rest. Cardiac: Regular rate and rhythm, no S3 or significant systolic murmur, no pericardial rub. Abdomen: Soft, nontender, no hepatomegaly, bowel sounds present, no guarding or rebound. Extremities: No pitting edema, distal pulses 2+. Skin: Warm and dry. Musculoskeletal: No kyphosis. Neuropsychiatric: Alert and oriented x3, affect grossly appropriate.  ECG:  NSR  Recent Labwork: 01/26/2023: ALT 11; AST 18; BUN 30; Creatinine, Ser 2.67; Hemoglobin 11.7; Platelets 242; Potassium 3.8; Sodium 139     Component Value Date/Time   CHOL 106 11/04/2022 1020   TRIG 58 11/04/2022 1020   HDL 49 11/04/2022 1020   CHOLHDL 2.2 11/04/2022 1020   CHOLHDL 2.6  11/08/2018 1017   VLDL 13 09/22/2015 0814   LDLCALC 44 11/04/2022 1020   LDLCALC 54 11/08/2018 1017    Assessment and Plan: Patient is a 52 year old M known to have HTN, HLD, CKD stage IV is here for the management of HTN.  # Resistant HTN c/w CKD IV -No more sluggishness after weaning off clonidine from 0.3 mg to 0.2 mg weekly patches.  Will continue clonidine 0.2 mg weekly until the next clinic visit and then wean off.  Continue amlodipine 10 mg once daily, carvedilol 3.125 mg twice daily, hydralazine 50 mg BID and Isordil 20 mg twice daily. -He is also scheduled for OSA evaluation as outpatient on Feb 17, 2023. -He underwent secondary causes of HTN workup in the past, plasma metanephrines, renal Doppler turned out to be negative. -Patient is not checking his blood pressures at home. Instructed him to bring his blood pressure machine  in the next few days (schedule nurse visit) and make sure it is working appropriately. He needs to check BPs in a.m. and p.m. Bring the log of BPs in 1 to 2 weeks and drop it by the front desk. If BP is more than 140/90 mmHg, will need to uptitrate current antihypertensive medication doses. -Patient was under stress on 01/25/2023 in the PM which led to elevated blood pressures on 01/26/2023.  Strongly encouraged to de-stress.  # CKD stage IV: Continue follow-up with nephrology, hydralazine was started by his nephrologist.  I have spent a total of 30 minutes with patient reviewing chart, EKGs, labs and examining patient as well as establishing an assessment and plan that was discussed with the patient.  > 50% of time was spent in direct patient care.     Medication Adjustments/Labs and Tests Ordered: Current medicines are reviewed at length with the patient today.  Concerns regarding medicines are outlined above.   Tests Ordered: No orders of the defined types were placed in this encounter.   Medication Changes: Meds ordered this encounter  Medications    isosorbide dinitrate (ISORDIL) 20 MG tablet    Sig: Take 1 tablet (20 mg total) by mouth 2 (two) times daily.    Dispense:  90 tablet    Refill:  3      Disposition:  Follow up  3 month  Signed, Laurieanne Galloway Verne Spurr, MD, 01/30/2023 11:41 AM    [facility] at [facility] [address] [place], [place] [address]

## 2023-01-30 NOTE — Patient Instructions (Signed)
Medication Instructions:  Your physician recommends that you continue on your current medications as directed. Please refer to the Current Medication list given to you today.  *If you need a refill on your cardiac medications before your next appointment, please call your pharmacy*   Lab Work: None If you have labs (blood work) drawn today and your tests are completely normal, you will receive your results only by: MyChart Message (if you have MyChart) OR A paper copy in the mail If you have any lab test that is abnormal or we need to change your treatment, we will call you to review the results.   Testing/Procedures: None   Follow-Up: At [facility], you and your health needs are our priority.  As part of our continuing mission to provide you with exceptional heart care, we have created designated Provider Care Teams.  These Care Teams include your primary Cardiologist (physician) and Advanced Practice Providers (APPs -  Physician Assistants and Nurse Practitioners) who all work together to provide you with the care you need, when you need it.  We recommend signing up for the patient portal called "MyChart".  Sign up information is provided on this After Visit Summary.  MyChart is used to connect with patients for Virtual Visits (Telemedicine).  Patients are able to view lab/test results, encounter notes, upcoming appointments, etc.  Non-urgent messages can be sent to your provider as well.   To learn more about what you can do with MyChart, go to [email or web address].    Your next appointment:   3 month(s)  Provider:   Luane School, MD    Other Instructions Nurse Visit- Please bring your blood pressure cuff with you.  Please keep a log of your blood pressure readings for 1-2 weeks and drop it off at the office.

## 2023-01-31 ENCOUNTER — Ambulatory Visit: Payer: BC Managed Care – PPO | Admitting: Internal Medicine

## 2023-02-14 ENCOUNTER — Ambulatory Visit: Payer: BC Managed Care – PPO | Attending: Family Medicine

## 2023-02-17 ENCOUNTER — Encounter: Payer: BC Managed Care – PPO | Admitting: Pulmonary Disease

## 2023-02-24 ENCOUNTER — Ambulatory Visit: Payer: BC Managed Care – PPO | Attending: Pulmonary Disease | Admitting: Pulmonary Disease

## 2023-02-24 DIAGNOSIS — G4733 Obstructive sleep apnea (adult) (pediatric): Secondary | ICD-10-CM | POA: Diagnosis not present

## 2023-03-06 ENCOUNTER — Telehealth: Payer: Self-pay | Admitting: Pulmonary Disease

## 2023-03-06 DIAGNOSIS — G4733 Obstructive sleep apnea (adult) (pediatric): Secondary | ICD-10-CM | POA: Diagnosis not present

## 2023-03-06 NOTE — Telephone Encounter (Signed)
Very mild OSA AHI 7/h He had several leg movements & grinding teeth during sleep. He can continue using CPAP or trial dental appliance if he wants to get off CPAP We can consider treatment for leg movements if this is a problem OV in 3 months

## 2023-03-06 NOTE — Procedures (Signed)
Patient Name: [name], [name] Date: 02/24/2023 Gender: Male D.O.B: [birth year] Age (years): 19 Referring Provider: Cyril Mourning MD, ABSM Height (inches): 69 Interpreting Physician: Cyril Mourning MD, ABSM Weight (lbs): 180 RPSGT: Alfonso Ellis BMI: 27 MRN: [id number] Neck Size: 16.00 <br> <br> CLINICAL INFORMATION Sleep Study Type: NPSG    Indication for sleep study: Hypertension, reassessment of OSA after weight loss    Epworth Sleepiness Score: 9     Most recent polysomnogram was on 02/04/2016 -240 pounds-TST 3 1 6  minutes, AHI 25/hour central 7/hour, lowest desaturation 85%, SLEEP STUDY TECHNIQUE As per the AASM Manual for the Scoring of Sleep and Associated Events v2.3 (April 2016) with a hypopnea requiring 4% desaturations.  The channels recorded and monitored were frontal, central and occipital EEG, electrooculogram (EOG), submentalis EMG (chin), nasal and oral airflow, thoracic and abdominal wall motion, anterior tibialis EMG, snore microphone, electrocardiogram, and pulse oximetry.  MEDICATIONS Medications self-administered by patient taken the night of the study : N/A  SLEEP ARCHITECTURE The study was initiated at 11:04:49 PM and ended at 5:30:54 AM.  Sleep onset time was 19.8 minutes and the sleep efficiency was 79.4%. The total sleep time was 306.5 minutes.  Stage REM latency was 151.5 minutes.  The patient spent 8.97% of the night in stage N1 sleep, 81.08% in stage N2 sleep, 1.14% in stage N3 and 8.8% in REM.  Alpha intrusion was absent.  Supine sleep was 27.57%.  RESPIRATORY PARAMETERS The overall apnea/hypopnea index (AHI) was 7.6 per hour. There were 20 total apneas, including 12 obstructive, 8 central and 0 mixed apneas. There were 19 hypopneas and 1 RERAs.  The AHI during Stage REM sleep was 2.2 per hour.  AHI while supine was 24.9 per hour.  The mean oxygen saturation was 94.60%. The minimum SpO2 during sleep was 89.00%.  moderate snoring  was noted during this study.  CARDIAC DATA The 2 lead EKG demonstrated sinus rhythm. The mean heart rate was 52.29 beats per minute. Other EKG findings include: None.   LEG MOVEMENT DATA The total PLMS were 477 with a resulting PLMS index of 93.38. Associated arousal with leg movement index was 19.6 .  IMPRESSIONS - Mild obstructive sleep apnea occurred during this study (AHI = 7.6/h). Events were mostly noted during supine sleep. - No significant central sleep apnea occurred during this study (CAI = 1.6/h). - The patient had minimal or no oxygen desaturation during the study (Min O2 = 89.00%) - The patient snored with moderate snoring volume. - No cardiac abnormalities were noted during this study. - Severe periodic limb movements of sleep occurred during the study. Associated arousals were significant.   DIAGNOSIS - Obstructive Sleep Apnea (G47.33) - Periodic Limb Movement During Sleep (G47.61) - Bruxism (G47.63)   RECOMMENDATIONS - Positional therapy avoiding supine position during sleep. - Mirapex, Requip, or Sinemet for treatment of Periodic Leg Movements of Sleep. - Very mild obstructive sleep apnea. Return to discuss treatment options. - Consider oral bite guard for bruxism - Avoid alcohol, sedatives and other CNS depressants that may worsen sleep apnea and disrupt normal sleep architecture. - Sleep hygiene should be reviewed to assess factors that may improve sleep quality. - Weight management and regular exercise should be initiated or continued if appropriate.   Cyril Mourning MD Board Certified in Sleep medicine

## 2023-03-21 ENCOUNTER — Encounter: Payer: BC Managed Care – PPO | Admitting: Pulmonary Disease

## 2023-05-02 ENCOUNTER — Ambulatory Visit: Payer: BC Managed Care – PPO | Admitting: Internal Medicine

## 2023-05-19 ENCOUNTER — Other Ambulatory Visit: Payer: Self-pay | Admitting: Family Medicine

## 2023-05-22 ENCOUNTER — Other Ambulatory Visit: Payer: Self-pay | Admitting: Family Medicine

## 2023-06-13 ENCOUNTER — Encounter: Payer: Self-pay | Admitting: Internal Medicine

## 2023-06-13 ENCOUNTER — Telehealth: Payer: Self-pay | Admitting: Pulmonary Disease

## 2023-06-13 ENCOUNTER — Ambulatory Visit: Payer: BC Managed Care – PPO | Attending: Internal Medicine | Admitting: Internal Medicine

## 2023-06-13 NOTE — Progress Notes (Signed)
Erroneous encounter - please disregard.

## 2023-08-07 ENCOUNTER — Encounter: Payer: Self-pay | Admitting: Internal Medicine

## 2023-08-07 ENCOUNTER — Ambulatory Visit: Payer: BC Managed Care – PPO | Admitting: Internal Medicine

## 2023-08-07 ENCOUNTER — Telehealth: Payer: Self-pay

## 2023-08-07 VITALS — BP 129/80 | HR 63 | Resp 16 | Ht 72.0 in | Wt 178.0 lb

## 2023-08-07 DIAGNOSIS — G43711 Chronic migraine without aura, intractable, with status migrainosus: Secondary | ICD-10-CM | POA: Diagnosis not present

## 2023-08-07 DIAGNOSIS — G43909 Migraine, unspecified, not intractable, without status migrainosus: Secondary | ICD-10-CM | POA: Insufficient documentation

## 2023-08-07 DIAGNOSIS — G4700 Insomnia, unspecified: Secondary | ICD-10-CM | POA: Diagnosis not present

## 2023-08-07 DIAGNOSIS — G43709 Chronic migraine without aura, not intractable, without status migrainosus: Secondary | ICD-10-CM | POA: Diagnosis not present

## 2023-08-07 MED ORDER — HYDROXYZINE PAMOATE 50 MG PO CAPS
50.0000 mg | ORAL_CAPSULE | Freq: Three times a day (TID) | ORAL | 0 refills | Status: DC | PRN
Start: 2023-08-07 — End: 2023-11-08

## 2023-08-07 MED ORDER — SUMATRIPTAN SUCCINATE 50 MG PO TABS
50.0000 mg | ORAL_TABLET | ORAL | 0 refills | Status: DC | PRN
Start: 2023-08-07 — End: 2023-09-11

## 2023-08-07 NOTE — Patient Instructions (Signed)
It was a pleasure to see you today.  Thank you for giving [place] the opportunity to be involved in your care.  Below is a brief recap of your visit and next steps.  We will plan to see you again in 3 months.  Summary Add Imitrex for as needed migraine relief Follow up with sleep medicine Hydroxyzine refilled Follow up in 3 months

## 2023-08-07 NOTE — Telephone Encounter (Signed)
Patient seen by Dr. Durwin Nora in PCP office today [name] and not using CPAP. Patient reported he has not received any CPAP supplies recently. Per chart patient has not been seen by Pulm/Sleep since 06/2022 with no ROV scheduled since then. Also, per chart, patient had SNST 02/24/23 and CPAP supplies were ordered with [facility] 06/2022.  Per [facility] patient was contacted after 06/2022 supply order was placed. They spoke with patient's wife and were informed by her that patient did not need supplies but rather a new CPAP machine. They stated they could not provide a new machine without an order.   Patient did not follow up with Pulm/Sleep regarding request for new machine. Patient will need ROV to discuss.   Please contact patient to schedule. Thank you!   From Telephone Enc 03/06/23: Oretha Milch, MD   03/06/23  3:44 AM Note Very mild OSA AHI 7/h He had several leg movements & grinding teeth during sleep. He can continue using CPAP or trial dental appliance if he wants to get off CPAP We can consider treatment for leg movements if this is a problem OV in 3 months

## 2023-08-07 NOTE — Assessment & Plan Note (Signed)
Presenting today for an acute visit endorsing increased frequency and intensity of migraine headaches.  No neurologic deficits appreciated on exam today.  He has previously been referred to neurology but has not been able to establish care.  Of note, he endorses that acute worsening of migraines correlates with stopping CPAP use.  He has a history of OSA and most recently completed an updated sleep study in June.  He states that he has had issues with obtaining CPAP supplies.  He also has not been seen by sleep medicine for follow-up. -Add sumatriptan 50 mg as needed for severe migraine relief -Will coordinate with pulmonology (sleep medicine) to assist [name] with obtaining CPAP supplies.  Consider further workup/attempting to establish care with neurology if migraines do not improve despite addressing OSA.

## 2023-08-07 NOTE — Progress Notes (Signed)
Acute Office Visit  Subjective:     Patient ID: [name], male    DOB: 01-28-1971, 52 y.o.   MRN: [id number]  Chief Complaint  Patient presents with   Headache    Has been waking up with a headache at least 3 days a week for the past month. Seems to be increasing in frequency   Medication Refill    Ran out of hydroxyzine that was prescribed by Dr Gerilyn Pilgrim and wants a refill    Mr. Luebke presents today for an acute visit in the setting of worsening migraine headaches.  He has a previously documented history of migraines.  Not currently on any prophylactic or abortive medication.  Mr. Askar states that over the last month headaches have increased in frequency and intensity.  They are currently occurring almost daily.  There is no change in pattern compared to previous migraines.  He has been taking aspirin for headache relief.  He endorses nausea with vomiting as well and has been using Zofran as needed.  Headaches are described as pulsatile in nature and occurring bitemporally.  He has experienced photophobia and blurred vision.  Going back to sleep after taking an aspirin helps to relieve his headaches.  Symptoms are worse with bending over.  Of note, he endorses that worsening symptoms correlate with when he stopped using CPAP for management of OSA.  He has previously been referred to neurology in the setting of poorly controlled migraines, but was unable to establish care.  Review of Systems  Eyes:  Positive for photophobia.  Gastrointestinal:  Positive for nausea and vomiting.  Neurological:  Positive for headaches.      Objective:    BP 129/80   Pulse 63   Resp 16   Ht 6' (1.829 m)   Wt 178 lb (80.7 kg)   SpO2 98%   BMI 24.14 kg/m   Physical Exam Vitals reviewed.  Constitutional:      General: He is not in acute distress.    Appearance: Normal appearance. He is not ill-appearing.  HENT:     Head: Normocephalic and atraumatic.     Right Ear: External ear  normal.     Left Ear: External ear normal.     Nose: Nose normal. No congestion or rhinorrhea.     Mouth/Throat:     Mouth: Mucous membranes are moist.     Pharynx: Oropharynx is clear.  Eyes:     General: No scleral icterus.    Extraocular Movements: Extraocular movements intact.     Conjunctiva/sclera: Conjunctivae normal.     Pupils: Pupils are equal, round, and reactive to light.  Cardiovascular:     Rate and Rhythm: Normal rate and regular rhythm.     Pulses: Normal pulses.     Heart sounds: Normal heart sounds. No murmur heard. Pulmonary:     Effort: Pulmonary effort is normal.     Breath sounds: Normal breath sounds. No wheezing, rhonchi or rales.  Abdominal:     General: Abdomen is flat. Bowel sounds are normal. There is no distension.     Palpations: Abdomen is soft.     Tenderness: There is no abdominal tenderness.  Musculoskeletal:        General: No swelling or deformity. Normal range of motion.     Cervical back: Normal range of motion.  Skin:    General: Skin is warm and dry.     Capillary Refill: Capillary refill takes less than 2 seconds.  Neurological:  General: No focal deficit present.     Mental Status: He is alert and oriented to person, place, and time.     Motor: No weakness.  Psychiatric:        Mood and Affect: Mood normal.        Behavior: Behavior normal.        Thought Content: Thought content normal.       Assessment & Plan:   Problem List Items Addressed This Visit       Migraines - Primary    Presenting today for an acute visit endorsing increased frequency and intensity of migraine headaches.  No neurologic deficits appreciated on exam today.  He has previously been referred to neurology but has not been able to establish care.  Of note, he endorses that acute worsening of migraines correlates with stopping CPAP use.  He has a history of OSA and most recently completed an updated sleep study in June.  He states that he has had issues  with obtaining CPAP supplies.  He also has not been seen by sleep medicine for follow-up. -Add sumatriptan 50 mg as needed for severe migraine relief -Will coordinate with pulmonology (sleep medicine) to assist Mr. Wagenblast with obtaining CPAP supplies.  Consider further workup/attempting to establish care with neurology if migraines do not improve despite addressing OSA.      Insomnia    He has a history of insomnia that has most recently been managed with hydroxyzine 50 mg nightly as needed.  Refill requested today.       Meds ordered this encounter  Medications   SUMAtriptan (IMITREX) 50 MG tablet    Sig: Take 1 tablet (50 mg total) by mouth every 2 (two) hours as needed for migraine. May repeat in 2 hours if headache persists or recurs.    Dispense:  10 tablet    Refill:  0   hydrOXYzine (VISTARIL) 50 MG capsule    Sig: Take 1 capsule (50 mg total) by mouth 3 (three) times daily as needed.    Dispense:  30 capsule    Refill:  0    Return in about 3 months (around 11/07/2023).  Billie Lade, MD

## 2023-08-07 NOTE — Assessment & Plan Note (Signed)
He has a history of insomnia that has most recently been managed with hydroxyzine 50 mg nightly as needed.  Refill requested today.

## 2023-08-10 NOTE — Telephone Encounter (Signed)
Patient scheduled for 2/3 with Dr Vassie Loll. Nothing further needed.

## 2023-09-11 ENCOUNTER — Other Ambulatory Visit: Payer: Self-pay | Admitting: Internal Medicine

## 2023-09-11 DIAGNOSIS — G43711 Chronic migraine without aura, intractable, with status migrainosus: Secondary | ICD-10-CM

## 2023-10-23 ENCOUNTER — Ambulatory Visit (HOSPITAL_BASED_OUTPATIENT_CLINIC_OR_DEPARTMENT_OTHER): Payer: BC Managed Care – PPO | Admitting: Pulmonary Disease

## 2023-10-23 ENCOUNTER — Encounter (HOSPITAL_BASED_OUTPATIENT_CLINIC_OR_DEPARTMENT_OTHER): Payer: Self-pay | Admitting: Pulmonary Disease

## 2023-11-07 ENCOUNTER — Telehealth: Payer: Self-pay

## 2023-11-07 NOTE — Telephone Encounter (Unsigned)
Copied from CRM [id number]. Topic: Clinical - Request for Lab/Test Order >> Nov 07, 2023 10:51 AM Dimitri Ped wrote: Reason for CRM: patient wife is calling for labs to be put in for patient . So we can get those results back to fill out form that was faxed and need this form till be filled out before the 28th. Blood work lab

## 2023-11-08 ENCOUNTER — Telehealth: Payer: BC Managed Care – PPO | Admitting: Family Medicine

## 2023-11-08 DIAGNOSIS — N184 Chronic kidney disease, stage 4 (severe): Secondary | ICD-10-CM | POA: Diagnosis not present

## 2023-11-08 DIAGNOSIS — I150 Renovascular hypertension: Secondary | ICD-10-CM

## 2023-11-08 DIAGNOSIS — R110 Nausea: Secondary | ICD-10-CM

## 2023-11-08 DIAGNOSIS — R7989 Other specified abnormal findings of blood chemistry: Secondary | ICD-10-CM

## 2023-11-08 DIAGNOSIS — Z13220 Encounter for screening for lipoid disorders: Secondary | ICD-10-CM

## 2023-11-08 DIAGNOSIS — G43719 Chronic migraine without aura, intractable, without status migrainosus: Secondary | ICD-10-CM

## 2023-11-08 DIAGNOSIS — R351 Nocturia: Secondary | ICD-10-CM

## 2023-11-08 DIAGNOSIS — G43909 Migraine, unspecified, not intractable, without status migrainosus: Secondary | ICD-10-CM

## 2023-11-08 DIAGNOSIS — F419 Anxiety disorder, unspecified: Secondary | ICD-10-CM

## 2023-11-08 DIAGNOSIS — G4733 Obstructive sleep apnea (adult) (pediatric): Secondary | ICD-10-CM | POA: Diagnosis not present

## 2023-11-08 DIAGNOSIS — I10 Essential (primary) hypertension: Secondary | ICD-10-CM

## 2023-11-08 DIAGNOSIS — E559 Vitamin D deficiency, unspecified: Secondary | ICD-10-CM | POA: Diagnosis not present

## 2023-11-08 DIAGNOSIS — Z125 Encounter for screening for malignant neoplasm of prostate: Secondary | ICD-10-CM

## 2023-11-08 DIAGNOSIS — G4700 Insomnia, unspecified: Secondary | ICD-10-CM

## 2023-11-08 MED ORDER — HYDROXYZINE PAMOATE 50 MG PO CAPS: 50.0000 mg | ORAL_CAPSULE | Freq: Three times a day (TID) | ORAL | 1 refills | Status: DC | PRN

## 2023-11-08 MED ORDER — ONDANSETRON 4 MG PO TBDP: 4.0000 mg | ORAL_TABLET | Freq: Three times a day (TID) | ORAL | 1 refills | Status: DC | PRN

## 2023-11-08 NOTE — Patient Instructions (Addendum)
 Nurse visit 8:30 am on 2/21, weight, BP and waist circ also pulse resp and oxygen level complete form  Fasting labs 2/21 CBC, lipid, cmp and EGFR, TSH, vit D and PSA  Vistaril and zofran will be prescribed  You wil be referred to Neurologist regarding headaches  I will try to see where you need to collect your CPAP machine, using this willl improve your health, need to reach out to presciber's office about this, (Dr Vassie Loll)

## 2023-11-10 ENCOUNTER — Encounter: Payer: Self-pay | Admitting: Family Medicine

## 2023-11-10 ENCOUNTER — Ambulatory Visit: Payer: BC Managed Care – PPO

## 2023-11-10 DIAGNOSIS — E559 Vitamin D deficiency, unspecified: Secondary | ICD-10-CM | POA: Diagnosis not present

## 2023-11-10 DIAGNOSIS — Z125 Encounter for screening for malignant neoplasm of prostate: Secondary | ICD-10-CM | POA: Diagnosis not present

## 2023-11-10 DIAGNOSIS — I10 Essential (primary) hypertension: Secondary | ICD-10-CM | POA: Diagnosis not present

## 2023-11-10 DIAGNOSIS — Z13220 Encounter for screening for lipoid disorders: Secondary | ICD-10-CM | POA: Diagnosis not present

## 2023-11-11 LAB — CMP14+EGFR
ALT: 7 [IU]/L (ref 0–44)
AST: 12 [IU]/L (ref 0–40)
Albumin: 4.5 g/dL (ref 3.8–4.9)
Alkaline Phosphatase: 87 [IU]/L (ref 44–121)
BUN/Creatinine Ratio: 12 (ref 9–20)
BUN: 30 mg/dL — ABNORMAL HIGH (ref 6–24)
Bilirubin Total: 0.5 mg/dL (ref 0.0–1.2)
CO2: 24 mmol/L (ref 20–29)
Calcium: 8.8 mg/dL (ref 8.7–10.2)
Chloride: 106 mmol/L (ref 96–106)
Creatinine, Ser: 2.57 mg/dL — ABNORMAL HIGH (ref 0.76–1.27)
Globulin, Total: 2 g/dL (ref 1.5–4.5)
Glucose: 95 mg/dL (ref 70–99)
Potassium: 4.4 mmol/L (ref 3.5–5.2)
Sodium: 142 mmol/L (ref 134–144)
Total Protein: 6.5 g/dL (ref 6.0–8.5)
eGFR: 29 mL/min/{1.73_m2} — ABNORMAL LOW (ref >59)

## 2023-11-11 LAB — LIPID PANEL
Chol/HDL Ratio: 2.4 {ratio} (ref 0.0–5.0)
Cholesterol, Total: 112 mg/dL (ref 100–199)
HDL: 46 mg/dL (ref >39)
LDL Chol Calc (NIH): 53 mg/dL (ref 0–99)
Triglycerides: 59 mg/dL (ref 0–149)
VLDL Cholesterol Cal: 13 mg/dL (ref 5–40)

## 2023-11-11 LAB — CBC
Hematocrit: 35 % — ABNORMAL LOW (ref 37.5–51.0)
Hemoglobin: 11.7 g/dL — ABNORMAL LOW (ref 13.0–17.7)
MCH: 34 pg — ABNORMAL HIGH (ref 26.6–33.0)
MCHC: 33.4 g/dL (ref 31.5–35.7)
MCV: 102 fL — ABNORMAL HIGH (ref 79–97)
Platelets: 229 10*3/uL (ref 150–450)
RBC: 3.44 x10E6/uL — ABNORMAL LOW (ref 4.14–5.80)
RDW: 11.8 % (ref 11.6–15.4)
WBC: 3.6 10*3/uL (ref 3.4–10.8)

## 2023-11-11 LAB — VITAMIN D 25 HYDROXY (VIT D DEFICIENCY, FRACTURES): Vit D, 25-Hydroxy: 14.2 ng/mL — ABNORMAL LOW (ref 30.0–100.0)

## 2023-11-11 LAB — TSH: TSH: 0.672 u[IU]/mL (ref 0.450–4.500)

## 2023-11-11 LAB — PSA: Prostate Specific Ag, Serum: 1.1 ng/mL (ref 0.0–4.0)

## 2023-11-12 ENCOUNTER — Encounter: Payer: Self-pay | Admitting: Family Medicine

## 2023-11-16 NOTE — Telephone Encounter (Unsigned)
 Copied from CRM [id number]. Topic: Clinical - Request for Lab/Test Order >> Nov 16, 2023 11:11 AM Dennison Nancy wrote: Patient 's spouse Lindsay Soulliere is checking on status if form that was to be completed for wellness for her job insurance ,it is due back by tomorrow 11/17/23

## 2023-11-17 ENCOUNTER — Encounter: Payer: Self-pay | Admitting: Family Medicine

## 2023-11-17 DIAGNOSIS — R7989 Other specified abnormal findings of blood chemistry: Secondary | ICD-10-CM | POA: Insufficient documentation

## 2023-11-17 DIAGNOSIS — F419 Anxiety disorder, unspecified: Secondary | ICD-10-CM | POA: Insufficient documentation

## 2023-11-17 DIAGNOSIS — I150 Renovascular hypertension: Secondary | ICD-10-CM | POA: Insufficient documentation

## 2023-11-17 MED ORDER — VITAMIN D (ERGOCALCIFEROL) 1.25 MG (50000 UNIT) PO CAPS: 50000.0000 [IU] | ORAL_CAPSULE | ORAL | 6 refills | Status: AC

## 2023-11-17 NOTE — Assessment & Plan Note (Signed)
 Reports response to hydroxyzine and will continue same

## 2023-11-17 NOTE — Assessment & Plan Note (Signed)
 Once weekly supplement to be prescribed

## 2023-11-17 NOTE — Assessment & Plan Note (Signed)
 Reports needs to get CPAP machine physically, wa sevaluated by puimonary in past 1 year, needs to contact that office

## 2023-11-17 NOTE — Assessment & Plan Note (Signed)
 Uncontrolled , reports increasingly debilitating , increased frequency and severity,

## 2023-11-17 NOTE — Assessment & Plan Note (Signed)
 Accompanies headaches, zofran prescribed

## 2023-11-17 NOTE — Assessment & Plan Note (Signed)
 DASH diet and commitment to daily physical activity for a minimum of 30 minutes discussed and encouraged, as a part of hypertension management. The importance of attaining a healthy weight is also discussed.     11/10/2023    9:31 AM 08/07/2023    1:45 PM 01/30/2023   11:03 AM 01/26/2023    2:30 PM 01/26/2023    2:05 PM 01/26/2023   12:30 PM 01/26/2023   11:30 AM  BP/Weight  Systolic BP 139 129 128 138 132 126 149  Diastolic BP 92 80 68 76 83 74 79  Wt. (Lbs) 170.04 178 175      BMI 23.06 kg/m2 24.14 kg/m2 23.73 kg/m2         Uncontrolled needs re eval in office

## 2023-11-17 NOTE — Progress Notes (Signed)
 Virtual Visit via Video Note  I connected with [name] on 11/08/2023 at  2:40 PM EST by a video enabled telemedicine application and verified that I am speaking with the correct person using two identifiers.  Location: Patient: home Provider: office   I discussed the limitations of evaluation and management by telemedicine and the availability of in person appointments. The patient expressed understanding and agreed to proceed.  History of Present Illness: Follow up visit for general conditions and evaluation for health insurance purposes C/o uncontrolled severe frequent disabling headaches , states he is now redy to return to neurology for help as headaches are worsening. Denies new weakness or numbness, has nausea with headache and requests zofran refill Reports improved anxiety with vistaril and requests refill Need to re establish with Nephrology   Observations/Objective: There were no vitals taken for this visit. Good communication with no confusion and intact memory. Alert and oriented x 3 No signs of respiratory distress during speech   Assessment and Plan: Migraine headache Uncontrolled , reports increasingly debilitating , increased frequency and severity,   Anxiety Reports response to hydroxyzine, will continue same  CKD (chronic kidney disease) stage 4, GFR 15-29 ml/min (HCC) Essentially unchanged x 1 year, needs to f/u with Nephrology  Nausea Accompanies headaches, zofran prescribed  Low vitamin D level Once weekly supplement to be prescribed  Insomnia Reports response to hydroxyzine and will continue same  OSA (obstructive sleep apnea) Reports needs to get CPAP machine physically, wa sevaluated by puimonary in past 1 year, needs to contact that office  Renovascular hypertension DASH diet and commitment to daily physical activity for a minimum of 30 minutes discussed and encouraged, as a part of hypertension management. The importance of attaining a  healthy weight is also discussed.     11/10/2023    9:31 AM 08/07/2023    1:45 PM 01/30/2023   11:03 AM 01/26/2023    2:30 PM 01/26/2023    2:05 PM 01/26/2023   12:30 PM 01/26/2023   11:30 AM  BP/Weight  Systolic BP 139 129 128 138 132 126 149  Diastolic BP 92 80 68 76 83 74 79  Wt. (Lbs) 170.04 178 175      BMI 23.06 kg/m2 24.14 kg/m2 23.73 kg/m2         Uncontrolled needs re eval in office   I discussed the assessment and treatment plan with the patient. The patient was provided an opportunity to ask questions and all were answered. The patient agreed with the plan and demonstrated an understanding of the instructions.   The patient was advised to call back or seek an in-person evaluation if the symptoms worsen or if the condition fails to improve as anticipated.  I provided 25 minutes of non-face-to-face time during this encounter.   Syliva Overman, MD

## 2023-11-17 NOTE — Assessment & Plan Note (Signed)
 Reports response to hydroxyzine, will continue same

## 2023-11-17 NOTE — Assessment & Plan Note (Signed)
 Essentially unchanged x 1 year, needs to f/u with Nephrology

## 2023-11-17 NOTE — Telephone Encounter (Signed)
 Form faxed

## 2023-11-29 ENCOUNTER — Encounter: Payer: Self-pay | Admitting: Internal Medicine

## 2023-11-29 ENCOUNTER — Telehealth: Payer: BC Managed Care – PPO | Admitting: Internal Medicine

## 2023-11-29 ENCOUNTER — Ambulatory Visit: Attending: Internal Medicine | Admitting: Internal Medicine

## 2023-11-29 DIAGNOSIS — N189 Chronic kidney disease, unspecified: Secondary | ICD-10-CM | POA: Diagnosis not present

## 2023-11-29 DIAGNOSIS — R809 Proteinuria, unspecified: Secondary | ICD-10-CM | POA: Diagnosis not present

## 2023-11-29 DIAGNOSIS — D631 Anemia in chronic kidney disease: Secondary | ICD-10-CM | POA: Diagnosis not present

## 2023-11-29 NOTE — Progress Notes (Signed)
 Erroneous encounter - please disregard.

## 2023-12-08 DIAGNOSIS — I5032 Chronic diastolic (congestive) heart failure: Secondary | ICD-10-CM | POA: Diagnosis not present

## 2023-12-08 DIAGNOSIS — N184 Chronic kidney disease, stage 4 (severe): Secondary | ICD-10-CM | POA: Diagnosis not present

## 2023-12-08 DIAGNOSIS — R809 Proteinuria, unspecified: Secondary | ICD-10-CM | POA: Diagnosis not present

## 2023-12-08 DIAGNOSIS — I129 Hypertensive chronic kidney disease with stage 1 through stage 4 chronic kidney disease, or unspecified chronic kidney disease: Secondary | ICD-10-CM | POA: Diagnosis not present

## 2023-12-10 ENCOUNTER — Other Ambulatory Visit: Payer: Self-pay | Admitting: Family Medicine

## 2023-12-11 DIAGNOSIS — M79671 Pain in right foot: Secondary | ICD-10-CM | POA: Diagnosis not present

## 2023-12-11 DIAGNOSIS — M21621 Bunionette of right foot: Secondary | ICD-10-CM | POA: Diagnosis not present

## 2023-12-11 DIAGNOSIS — L851 Acquired keratosis [keratoderma] palmaris et plantaris: Secondary | ICD-10-CM | POA: Diagnosis not present

## 2023-12-11 DIAGNOSIS — M79672 Pain in left foot: Secondary | ICD-10-CM | POA: Diagnosis not present

## 2023-12-14 DIAGNOSIS — Z91199 Patient's noncompliance with other medical treatment and regimen due to unspecified reason: Secondary | ICD-10-CM | POA: Insufficient documentation

## 2023-12-14 DIAGNOSIS — N401 Enlarged prostate with lower urinary tract symptoms: Secondary | ICD-10-CM | POA: Insufficient documentation

## 2023-12-14 DIAGNOSIS — N281 Cyst of kidney, acquired: Secondary | ICD-10-CM | POA: Insufficient documentation

## 2023-12-14 NOTE — Progress Notes (Deleted)
 Name: [name] DOB: [birth year] MRN: [id number]  History of Present Illness: [name] is a 53 y.o. male who presents today as a new patient at [facility]. All available relevant medical records have been reviewed.  GU History includes: 1. BPH with LUTS (nocturia). - 2015: Cardura (Doxazosin) prescribed for BPH by his PCP. ***did it help?***  - 07/09/2021: Per RUS report "The prostate gland is mildly enlarged measuring 4.9 x 3.6 x 4.2 cm for a volume of 38 cc." - 11/10/2023: Normal PSA (1.1). 2. Bilateral renal cysts. 3. CKD stage 4.  - Followed by Nephrology (Dr. Wolfgang Phoenix). - 11/10/2023: GFR 29, creatinine 2.57.  RUS on 11/22/2022:  1. Echogenic kidneys compatible with medical renal disease. 2. Multiple bilateral renal cysts. 3. No suspicious mass or hydronephrosis. 4. Bilateral distal ureter shown to be patent at the level of the bladder (bilateral ureteral jets visualized). 5. Small postvoid residual of 70 cc.  Today: He reports bothersome ***  ***urinary urgency, frequency, nocturia, dysuria, gross hematuria, ***weak urinary stream, hesitancy, straining to void, or sensations of incomplete emptying. Reports voiding ***x/day and ***x/night.  He {Actions; denies-reports:120008} flank pain or abdominal pain.  He {Actions; denies-reports:120008} constipation.  Nocturia: - Relevant history includes: obstructive sleep apnea, insomnia, anxiety.  - Per Dr. Lucio Edward note on 11/07/2022: "Patient has history of sleep apnea but noncompliant with his CPAP machine".  - Per PCP note on 11/08/2023: "Reports needs to get CPAP machine physically, wa sevaluated by puimonary in past 1 year, needs to contact that office".  He {Actions; denies-reports:120008} fluid intake within 3 hours prior to bedtime. He {Actions; denies-reports:120008} fluid intake during the night.  He {Actions; denies-reports:120008} caffeine intake within 8 hours prior to bedtime. He {Actions;  denies-reports:120008} routinely experiencing lower extremity edema during the day. ***He {Actions; denies-reports:120008} elevating feet during the day and/or wearing compression socks when lower extremity edema is present during the day. ***He {Actions; denies-reports:120008} monitoring dietary salt and sodium intake.   Medications: Current Outpatient Medications  Medication Sig Dispense Refill   amLODipine (NORVASC) 10 MG tablet TAKE 1 TABLET BY MOUTH IN THE MORNING AND TAKE 1 TABLET BY MOUTH AT 8 PM FOR BLOOD PRESSURE 180 tablet 1   carvedilol (COREG) 3.125 MG tablet Take 1 tablet (3.125 mg total) by mouth 2 (two) times daily with a meal. 60 tablet 3   Cholecalciferol (VITAMIN D3) 25 MCG (1000 UT) CAPS Take 1 capsule (1,000 Units total) by mouth daily. 60 capsule 3   cloNIDine (CATAPRES - DOSED IN MG/24 HR) 0.3 mg/24hr patch APPLY 1 PATCH ON THE SKIN ONCE A WEEK 12 patch 1   esomeprazole (NEXIUM) 20 MG capsule Take 1 capsule (20 mg total) by mouth 2 (two) times daily before a meal. (Patient taking differently: Take 20 mg by mouth daily before breakfast.) 60 capsule 3   hydrOXYzine (VISTARIL) 50 MG capsule Take 1 capsule (50 mg total) by mouth 3 (three) times daily as needed. 90 capsule 1   isosorbide dinitrate (ISORDIL) 20 MG tablet Take 1 tablet (20 mg total) by mouth 2 (two) times daily. 90 tablet 3   ondansetron (ZOFRAN-ODT) 4 MG disintegrating tablet Take 1 tablet (4 mg total) by mouth every 8 (eight) hours as needed for nausea. 10 tablet 0   ondansetron (ZOFRAN-ODT) 4 MG disintegrating tablet Take 1 tablet (4 mg total) by mouth every 8 (eight) hours as needed for nausea or vomiting. 30 tablet 1   SUMAtriptan (IMITREX) 50 MG tablet TAKE 1  TABLET BY MOUTH EVERY 2 HOURS AS NEEDED FOR MIGRAINE. MAY REPEAT IN 2 HOURS IF HEADACHE PERSISTS OR RECURS. 10 tablet 0   Vitamin D, Ergocalciferol, (DRISDOL) 1.25 MG (50000 UNIT) CAPS capsule Take 1 capsule (50,000 Units total) by mouth every 7 (seven)  days. 5 capsule 6   No current facility-administered medications for this visit.    Allergies: Allergies  Allergen Reactions   Bee Venom Anaphylaxis   Prochlorperazine Edisylate Other (See Comments)    Reaction: Jittery,uncomfortable feeling   Cyclobenzaprine Hcl Other (See Comments)    REACTION: feel anxious, nausea   Aleve [Naproxen Sodium] Nausea And Vomiting    States that this medication also causes abdominal pain   Carbamazepine     REACTION: unknown reaction   Geodon [Ziprasidone Hcl] Nausea And Vomiting   Ibuprofen Nausea And Vomiting    States that this medication also causes abdominal pain   Maxzide [Hydrochlorothiazide W-Triamterene]     headache   Nsaids Other (See Comments)    H/o severe anemia from UGIB from ulcer   Tylenol [Acetaminophen] Nausea And Vomiting    Patient states that this medication also causes abdominal pain   Other Nausea And Vomiting, Rash and Other (See Comments)    ALL MUSCLE RELAXANTS: states that they affect sciatic nerve, may cause nausea and vomiting, rash    Past Medical History:  Diagnosis Date   Acute kidney injury (HCC)    Bilateral renal cysts 02/23/2014   Noted in 2013 initially    Chronic back pain    Depression, major, single episode, severe (HCC) 08/13/2018   pHQ 9 score of 24, not suicidal or homicidal, score of 8 in 09/2018, no medication or therapy as of this time   Duodenitis with bleeding 08/2008   Ulcer - admitted to [facility]    Essential hypertension, benign    GERD (gastroesophageal reflux disease)    Headache(784.0)    Hyperlipidemia    Hypertensive urgency 10/08/2019   Malignant hypertension 10/09/2007        Migraines    Nonspecific abnormal electrocardiogram (ECG) (EKG) 08/30/2016   Obesity, unspecified    Sleep apnea    Stress and adjustment reaction 11/04/2016   Past Surgical History:  Procedure Laterality Date   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   Procedure: 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.;  Surgeon: Jodi Marble, MD;  Location: [facility] OR;  Service: Orthopedics;  Laterality: Left;   CHOLECYSTECTOMY N/A 10/03/2018   Procedure: LAPAROSCOPIC CHOLECYSTECTOMY;  Surgeon: Franky Macho, MD;  Location: [facility] ORS;  Service: General;  Laterality: N/A;   FINGER DEBRIDEMENT Left 09/25/2013   Thumb   FOOT FRACTURE SURGERY Right    Family History  Problem Relation Age of Onset   Diabetes Mother    Hypertension Mother    Hypertension Father    Arthritis Father    Diabetes Father    Colon cancer Neg Hx    Gastric cancer Neg Hx    Esophageal cancer Neg Hx    Social History   Socioeconomic History   Marital status: Married    Spouse name: Not on file   Number of children: 4   Years of education: Not on file   Highest education level: Not on file  Occupational History   Occupation: Musician     Comment: [employer]  Tobacco Use   Smoking status: Former    Current packs/day: 0.00    Average packs/day: 0.1 packs/day for 15.0  years (1.5 ttl pk-yrs)    Types: Cigarettes    Start date: 09/15/1997    Quit date: 09/15/2012    Years since quitting: 11.2   Smokeless tobacco: Never  Vaping Use   Vaping status: Never Used  Substance and Sexual Activity   Alcohol use: No   Drug use: No   Sexual activity: Yes  Other Topics Concern   Not on file  Social History Narrative   Lives with his family, work part time at [employer].    Social Drivers of Corporate investment banker Strain: Low Risk  (06/25/2019)   Overall Financial Resource Strain (CARDIA)    Difficulty of Paying Living Expenses: Not very hard  Food Insecurity: No Food Insecurity (06/25/2019)   Hunger Vital Sign    Worried About Running Out of Food in the Last Year: Never true    Ran Out of Food in the Last Year: Never true  Transportation Needs: No Transportation Needs (06/25/2019)   PRAPARE - Administrator, Civil Service (Medical): No    Lack of  Transportation (Non-Medical): No  Physical Activity: Unknown (06/25/2019)   Exercise Vital Sign    Days of Exercise per Week: Patient declined    Minutes of Exercise per Session: Patient declined  Stress: Not on file  Social Connections: Unknown (06/25/2019)   Social Connection and Isolation Panel [NHANES]    Frequency of Communication with Friends and Family: Patient declined    Frequency of Social Gatherings with Friends and Family: Patient declined    Attends Religious Services: Patient declined    Database administrator or Organizations: Patient declined    Attends Banker Meetings: Patient declined    Marital Status: Patient declined  Intimate Partner Violence: Not At Risk (06/25/2019)   Humiliation, Afraid, Rape, and Kick questionnaire    Fear of Current or Ex-Partner: No    Emotionally Abused: No    Physically Abused: No    Sexually Abused: No    SUBJECTIVE  Review of Systems Constitutional: Patient denies any unintentional weight loss or change in strength lntegumentary: Patient denies any rashes or pruritus Cardiovascular: Patient denies chest pain or syncope Respiratory: Patient denies shortness of breath Gastrointestinal: ***Patient {Actions; denies-reports:120008} ***nausea, ***vomiting, ***constipation, ***diarrhea ***As per HPI Musculoskeletal: Patient denies muscle cramps or weakness Neurologic: Patient denies convulsions or seizures Allergic/Immunologic: Patient denies recent allergic reaction(s) Hematologic/Lymphatic: Patient denies bleeding tendencies Endocrine: Patient denies heat/cold intolerance  GU: As per HPI.  OBJECTIVE There were no vitals filed for this visit. There is no height or weight on file to calculate BMI.  Physical Examination Constitutional: No obvious distress; patient is non-toxic appearing  Cardiovascular: No visible lower extremity edema.  Respiratory: The patient does not have audible wheezing/stridor; respirations do  not appear labored  Gastrointestinal: Abdomen non-distended Musculoskeletal: Normal ROM of UEs  Skin: No obvious rashes/open sores  Neurologic: CN 2-12 grossly intact Psychiatric: Answered questions appropriately with normal affect  Hematologic/Lymphatic/Immunologic: No obvious bruises or sites of spontaneous bleeding  UA: ***negative ***positive for *** leukocytes, *** blood, ***nitrites Urine microscopy: *** WBC/hpf, *** RBC/hpf, *** bacteria ***otherwise unremarkable ***glucosuria (secondary to ***Jardiance ***Farxiga use)  PVR: *** ml  ASSESSMENT Benign prostatic hyperplasia with nocturia  CKD (chronic kidney disease) stage 4, GFR 15-29 ml/min (HCC)  Bilateral renal cysts  Insomnia, unspecified type  Anxiety  OSA (obstructive sleep apnea)  Problem with medical care compliance  BPH with LUTS. After careful review of all available data and  history and physical examination, it is felt that this patient has symptomatic BPH/BOO. He has other likely contributing factors for nocturia including insomnia and ***untreated OSA. We reviewed possible etiologies for nocturia including but not limited to: caffeine intake, evening fluid intake, sleep apnea, peripheral edema, reduced bladder capacity, renal tubular dysfunction. We discussed how obstructive sleep apnea can contribute to nocturia and/or nocturnal polyuria. We reviewed management options including: Behavioral changes: Wear CPAP nightly. Minimizing caffeine intake (especially within 6-8 hours before bedtime). Minimizing fluid intake within 3 hours before bedtime. Minimizing overnight fluid intake. Medications: ***Flomax (Tamsulosin) ***0.4 mg ***daily.  ***Oxybutynin IR 5 mg nightly. ***Daily anticholinergic medication or Beta-3 adrenergic agonist medication.  He elected to ***  We agreed to plan for follow up in *** months or sooner if needed. Patient verbalized understanding of and agreement with current plan. All  questions were answered.  PLAN Advised the following: Wear CPAP nightly. Minimizing caffeine intake (especially within 6-8 hours before bedtime). Minimizing fluid intake within 3 hours before bedtime. Minimizing overnight fluid intake. ***Flomax (Tamsulosin) 0.4 mg daily. ***No follow-ups on file.  No orders of the defined types were placed in this encounter.   It has been explained that the patient is to follow regularly with their PCP in addition to all other providers involved in their care and to follow instructions provided by these respective offices. Patient advised to contact urology clinic if any urologic-pertaining questions, concerns, new symptoms or problems arise in the interim period.  There are no Patient Instructions on file for this visit.  Electronically signed by:  Donnita Falls, MSN, FNP-C, CUNP 12/14/2023 3:24 PM

## 2023-12-16 ENCOUNTER — Other Ambulatory Visit: Payer: Self-pay | Admitting: Family Medicine

## 2023-12-18 ENCOUNTER — Ambulatory Visit: Admitting: Urology

## 2023-12-18 DIAGNOSIS — N184 Chronic kidney disease, stage 4 (severe): Secondary | ICD-10-CM

## 2023-12-18 DIAGNOSIS — N401 Enlarged prostate with lower urinary tract symptoms: Secondary | ICD-10-CM

## 2023-12-18 DIAGNOSIS — Z91199 Patient's noncompliance with other medical treatment and regimen due to unspecified reason: Secondary | ICD-10-CM

## 2023-12-18 DIAGNOSIS — F419 Anxiety disorder, unspecified: Secondary | ICD-10-CM

## 2023-12-18 DIAGNOSIS — N281 Cyst of kidney, acquired: Secondary | ICD-10-CM

## 2023-12-18 DIAGNOSIS — G4733 Obstructive sleep apnea (adult) (pediatric): Secondary | ICD-10-CM

## 2023-12-18 DIAGNOSIS — G4700 Insomnia, unspecified: Secondary | ICD-10-CM

## 2023-12-20 ENCOUNTER — Telehealth: Payer: Self-pay

## 2023-12-20 ENCOUNTER — Other Ambulatory Visit (HOSPITAL_COMMUNITY): Payer: Self-pay

## 2023-12-20 ENCOUNTER — Telehealth: Payer: Self-pay | Admitting: Pharmacy Technician

## 2023-12-20 NOTE — Telephone Encounter (Signed)
 Copied from CRM [id number]. Topic: Clinical - Prescription Issue >> Dec 20, 2023 11:47 AM Ivette P wrote: Reason for CRM: Tonia calling for husband medication hydrOXYzine (VISTARIL) 50 MG capsule. Pharmacy [facility] is requiring a Prior Authorization

## 2023-12-20 NOTE — Telephone Encounter (Signed)
 PA request has been Received. New Encounter has been or will be created for follow up. For additional info see Pharmacy Prior Auth telephone encounter from 12/20/2023.

## 2023-12-20 NOTE — Telephone Encounter (Signed)
 Pharmacy Patient Advocate Encounter   Received notification from Pt Calls Messages that prior authorization for HYDROXYZINE 50MG  CAPSULE is required/requested.   Insurance verification completed.   The patient is insured through [facility] .   Per test claim: The current 30 day co-pay is, $0.00.  No PA needed at this time. Patient may be eligible for a Medicare prescription Payment plan. The patient will need to reach out to their insurance company to enrol in the payment plan to spread out their payments throughout the year, If available. This test claim was processed through [facility]- copay amounts may vary at other pharmacies due to pharmacy/plan contracts, or as the patient moves through the different stages of their insurance plan.

## 2023-12-21 ENCOUNTER — Encounter (HOSPITAL_BASED_OUTPATIENT_CLINIC_OR_DEPARTMENT_OTHER): Payer: Self-pay | Admitting: Pulmonary Disease

## 2023-12-21 ENCOUNTER — Other Ambulatory Visit: Payer: Self-pay

## 2023-12-21 ENCOUNTER — Encounter: Payer: Self-pay | Admitting: Family Medicine

## 2023-12-21 ENCOUNTER — Ambulatory Visit (HOSPITAL_BASED_OUTPATIENT_CLINIC_OR_DEPARTMENT_OTHER): Payer: BC Managed Care – PPO | Admitting: Pulmonary Disease

## 2023-12-21 VITALS — BP 122/76 | HR 76 | Ht 72.0 in | Wt 181.3 lb

## 2023-12-21 DIAGNOSIS — G4733 Obstructive sleep apnea (adult) (pediatric): Secondary | ICD-10-CM | POA: Diagnosis not present

## 2023-12-21 DIAGNOSIS — G4700 Insomnia, unspecified: Secondary | ICD-10-CM

## 2023-12-21 MED ORDER — HYDROXYZINE PAMOATE 50 MG PO CAPS
50.0000 mg | ORAL_CAPSULE | Freq: Three times a day (TID) | ORAL | 1 refills | Status: DC | PRN
Start: 2023-12-21 — End: 2024-01-15

## 2023-12-21 NOTE — Progress Notes (Signed)
   Subjective:    Patient ID: [name], male    DOB: 1971-06-11, 53 y.o.   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     PMH -hypertension requiring 3 medications CKD stage IV   He was diagnosed with OSA in 2017 and provided with a CPAP machine.    The patient, with a history of mild sleep apnea, presents with increased symptoms since the last visit. He reports waking up with headaches and episodes of apnea during sleep, as observed by his partner. The patient also reports feeling tired and not resting well despite spending a significant amount of time in bed. He describes frequent awakenings during the night to use the bathroom and episodes of coughing. The patient has lost a significant amount of weight since the last visit. He previously used a CPAP machine with a full face mask, which he found effective, but he currently does not have a machine. The patient also reports teeth grinding during sleep.   Significant tests/ events reviewed    02/2023 very mild OSA AHI 7/h . He had several leg movements & grinding teeth during sleep. 01/2016 NPSG -240 pounds-TST 3 1 6  minutes, AHI 25/hour central 7/hour, lowest desaturation 85%,  Review of Systems neg for any significant sore throat, dysphagia, itching, sneezing, nasal congestion or excess/ purulent secretions, fever, chills, sweats, unintended wt loss, pleuritic or exertional cp, hempoptysis, orthopnea pnd or change in chronic leg swelling. Also denies presyncope, palpitations, heartburn, abdominal pain, nausea, vomiting, diarrhea or change in bowel or urinary habits, dysuria,hematuria, rash, arthralgias, visual complaints, headache, numbness weakness or ataxia.     Objective:   Physical Exam  Gen. Pleasant, well-nourished, in no distress ENT - no thrush, no pallor/icterus,no post nasal drip, normal bite Neck: No JVD, no thyromegaly, no carotid bruits Lungs: no use of accessory muscles, no dullness  to percussion, clear without rales or rhonchi  Cardiovascular: Rhythm regular, heart sounds  normal, no murmurs or gallops, no peripheral edema Musculoskeletal: No deformities, no cyanosis or clubbing        Assessment & Plan:    Mild Obstructive Sleep Apnea Mild obstructive sleep apnea with 7-8 apneic events per hour. Reports increased tiredness, morning headaches, and observed apneic episodes. Previously used CPAP but experienced mask dislodgement. Prefers a full face mask for CPAP therapy. CPAP is recommended to address symptoms and may also alleviate bruxism and periodic limb movements. - Prescribe auto CPAP machine with a full face mask. - Send prescription to [facility]. - Instruct him to use CPAP for about a month and return for follow-up to review the machine's report and adjust settings if necessary.  Bruxism Teeth grinding during sleep noted in the sleep study. Discussed potential for a mouth guard to address bruxism and sleep apnea by advancing the jaw. Prioritizing CPAP therapy as it may alleviate bruxism. - Monitor bruxism symptoms after initiating CPAP therapy. - Consider referral to a dentist for a mouth guard if bruxism persists.  Periodic Limb Movement Disorder (PLMD) Reports leg movements during sleep, potentially related to sleep apnea. CPAP therapy is expected to address these movements if related to sleep apnea. - Monitor leg movements after initiating CPAP therapy.

## 2023-12-21 NOTE — Patient Instructions (Signed)
 X Start autoCPAP 5-15 cm with full face mask to [facility]

## 2023-12-25 ENCOUNTER — Telehealth (HOSPITAL_BASED_OUTPATIENT_CLINIC_OR_DEPARTMENT_OTHER): Payer: Self-pay | Admitting: Pulmonary Disease

## 2023-12-25 ENCOUNTER — Telehealth (HOSPITAL_BASED_OUTPATIENT_CLINIC_OR_DEPARTMENT_OTHER): Payer: Self-pay

## 2023-12-25 NOTE — Telephone Encounter (Signed)
 Faxed order to [facility].  Notified pt.

## 2023-12-25 NOTE — Telephone Encounter (Signed)
 Notified pts wife that fax was sent on 12/22/2023 @ 9:48am to [facility] Copied from [employer] [id number]. Topic: Clinical - Medication Question >> Dec 25, 2023  4:02 PM [name] wrote: Reason for CRM: Patient's spouse [name] [phone] states patient's cpap machine was not sent to [facility]. Please advise and call back

## 2023-12-26 NOTE — Telephone Encounter (Signed)
 On 12/25/23, refaxed order to [facility] & rec'd fax confirmation. Confirmed receipt of order with Selena Batten at [facility] LM on pt's VM to advise.  Nothing further needed at this time.

## 2024-01-12 ENCOUNTER — Other Ambulatory Visit: Payer: Self-pay | Admitting: Family Medicine

## 2024-01-12 DIAGNOSIS — G4700 Insomnia, unspecified: Secondary | ICD-10-CM

## 2024-01-15 DIAGNOSIS — G4733 Obstructive sleep apnea (adult) (pediatric): Secondary | ICD-10-CM | POA: Diagnosis not present

## 2024-01-26 ENCOUNTER — Encounter: Payer: BC Managed Care – PPO | Admitting: Family Medicine

## 2024-02-03 ENCOUNTER — Other Ambulatory Visit: Payer: Self-pay

## 2024-02-03 ENCOUNTER — Emergency Department (HOSPITAL_COMMUNITY)
Admission: EM | Admit: 2024-02-03 | Discharge: 2024-02-03 | Disposition: A | Discharge status: Home / Self Care | Attending: Emergency Medicine | Admitting: Emergency Medicine

## 2024-02-03 ENCOUNTER — Encounter (HOSPITAL_COMMUNITY): Payer: Self-pay | Admitting: Emergency Medicine

## 2024-02-03 DIAGNOSIS — I10 Essential (primary) hypertension: Secondary | ICD-10-CM | POA: Diagnosis not present

## 2024-02-03 DIAGNOSIS — R112 Nausea with vomiting, unspecified: Secondary | ICD-10-CM

## 2024-02-03 DIAGNOSIS — Z79899 Other long term (current) drug therapy: Secondary | ICD-10-CM | POA: Insufficient documentation

## 2024-02-03 DIAGNOSIS — R030 Elevated blood-pressure reading, without diagnosis of hypertension: Secondary | ICD-10-CM

## 2024-02-03 LAB — CBC WITH DIFFERENTIAL/PLATELET
Abs Immature Granulocytes: 0.02 10*3/uL (ref 0.00–0.07)
Basophils Absolute: 0 10*3/uL (ref 0.0–0.1)
Basophils Relative: 0 %
Eosinophils Absolute: 0 10*3/uL (ref 0.0–0.5)
Eosinophils Relative: 0 %
HCT: 34.8 % — ABNORMAL LOW (ref 39.0–52.0)
Hemoglobin: 11.8 g/dL — ABNORMAL LOW (ref 13.0–17.0)
Immature Granulocytes: 0 %
Lymphocytes Relative: 9 %
Lymphs Abs: 0.5 10*3/uL — ABNORMAL LOW (ref 0.7–4.0)
MCH: 33 pg (ref 26.0–34.0)
MCHC: 33.9 g/dL (ref 30.0–36.0)
MCV: 97.2 fL (ref 80.0–100.0)
Monocytes Absolute: 0.1 10*3/uL (ref 0.1–1.0)
Monocytes Relative: 2 %
Neutro Abs: 5.2 10*3/uL (ref 1.7–7.7)
Neutrophils Relative %: 89 %
Platelets: 265 10*3/uL (ref 150–400)
RBC: 3.58 MIL/uL — ABNORMAL LOW (ref 4.22–5.81)
RDW: 11 % — ABNORMAL LOW (ref 11.5–15.5)
WBC: 5.9 10*3/uL (ref 4.0–10.5)
nRBC: 0 % (ref 0.0–0.2)

## 2024-02-03 LAB — COMPREHENSIVE METABOLIC PANEL WITH GFR
ALT: 11 U/L (ref 0–44)
AST: 18 U/L (ref 15–41)
Albumin: 4.4 g/dL (ref 3.5–5.0)
Alkaline Phosphatase: 77 U/L (ref 38–126)
Anion gap: 6 (ref 5–15)
BUN: 32 mg/dL — ABNORMAL HIGH (ref 6–20)
CO2: 24 mmol/L (ref 22–32)
Calcium: 8.9 mg/dL (ref 8.9–10.3)
Chloride: 105 mmol/L (ref 98–111)
Creatinine, Ser: 2.58 mg/dL — ABNORMAL HIGH (ref 0.61–1.24)
GFR, Estimated: 29 mL/min — ABNORMAL LOW (ref >60)
Glucose, Bld: 138 mg/dL — ABNORMAL HIGH (ref 70–99)
Potassium: 3.4 mmol/L — ABNORMAL LOW (ref 3.5–5.1)
Sodium: 135 mmol/L (ref 135–145)
Total Bilirubin: 0.9 mg/dL (ref 0.0–1.2)
Total Protein: 7.5 g/dL (ref 6.5–8.1)

## 2024-02-03 LAB — TROPONIN I (HIGH SENSITIVITY): Troponin I (High Sensitivity): 11 ng/L (ref <18)

## 2024-02-03 MED ORDER — HYDRALAZINE HCL 20 MG/ML IJ SOLN
20.0000 mg | Freq: Once | INTRAMUSCULAR | Status: AC
  Administered 2024-02-03: 20 mg via INTRAVENOUS
  Filled 2024-02-03: qty 1

## 2024-02-03 MED ORDER — POTASSIUM CHLORIDE CRYS ER 20 MEQ PO TBCR
20.0000 meq | EXTENDED_RELEASE_TABLET | Freq: Once | ORAL | Status: AC
  Administered 2024-02-03: 20 meq via ORAL
  Filled 2024-02-03: qty 1

## 2024-02-03 MED ORDER — SODIUM CHLORIDE 0.9 % IV BOLUS
1000.0000 mL | Freq: Once | INTRAVENOUS | Status: AC
  Administered 2024-02-03: 1000 mL via INTRAVENOUS

## 2024-02-03 MED ORDER — ONDANSETRON 4 MG PO TBDP
4.0000 mg | ORAL_TABLET | Freq: Three times a day (TID) | ORAL | 0 refills | Status: AC | PRN
Start: 2024-02-03 — End: ?

## 2024-02-03 MED ORDER — ONDANSETRON HCL 4 MG/2ML IJ SOLN
4.0000 mg | Freq: Once | INTRAMUSCULAR | Status: AC
  Administered 2024-02-03: 4 mg via INTRAVENOUS
  Filled 2024-02-03: qty 2

## 2024-02-03 NOTE — ED Provider Notes (Signed)
 [place] EMERGENCY DEPARTMENT AT [facility] Provider Note   CSN: [id number] Arrival date & time: 02/03/24  1337     History  Chief Complaint  Patient presents with   Emesis   Hypertension    [name] is a 53 y.o. male.  Patient with history of hypertension, GERD, hyperlipidemia, migraines presents today with complaints of nausea, vomiting, hypertension. He states that same began this morning when he woke up. States that he has had this happen previously when his blood pressure gets too high. He took his night time meds last night but could not tolerate his morning meds. Denies chest pain or shortness of breath.   The history is provided by the patient. No language interpreter was used.  Emesis Hypertension       Home Medications Prior to Admission medications   Medication Sig Start Date End Date Taking? Authorizing Provider  amLODipine  (NORVASC ) 10 MG tablet TAKE 1 TABLET BY MOUTH IN THE MORNING AND TAKE 1 TABLET BY MOUTH AT 8 PM FOR BLOOD PRESSURE 12/18/23   Towanda Fret, MD  carvedilol  (COREG ) 3.125 MG tablet Take 1 tablet (3.125 mg total) by mouth 2 (two) times daily with a meal. 11/04/22   Towanda Fret, MD  Cholecalciferol (VITAMIN D3) 25 MCG (1000 UT) CAPS Take 1 capsule (1,000 Units total) by mouth daily. 11/13/21   Paseda, Folashade R, FNP  cloNIDine  (CATAPRES  - DOSED IN MG/24 HR) 0.3 mg/24hr patch APPLY 1 PATCH ON THE SKIN ONCE A WEEK 12/11/23   Towanda Fret, MD  esomeprazole  (NEXIUM ) 20 MG capsule Take 1 capsule (20 mg total) by mouth 2 (two) times daily before a meal. Patient taking differently: Take 20 mg by mouth daily before breakfast. 11/27/19   Iola Manila, NP  hydrOXYzine  (VISTARIL ) 50 MG capsule TAKE 1 CAPSULE BY MOUTH 3 TIMES DAILY AS NEEDED. 01/15/24   Towanda Fret, MD  isosorbide  dinitrate (ISORDIL ) 20 MG tablet Take 1 tablet (20 mg total) by mouth 2 (two) times daily. 01/30/23   Mallipeddi, Vishnu P, MD   ondansetron  (ZOFRAN -ODT) 4 MG disintegrating tablet Take 1 tablet (4 mg total) by mouth every 8 (eight) hours as needed for nausea. 01/26/23   Early Glisson, MD  ondansetron  (ZOFRAN -ODT) 4 MG disintegrating tablet Take 1 tablet (4 mg total) by mouth every 8 (eight) hours as needed for nausea or vomiting. 11/08/23   Towanda Fret, MD  SUMAtriptan  (IMITREX ) 50 MG tablet TAKE 1 TABLET BY MOUTH EVERY 2 HOURS AS NEEDED FOR MIGRAINE. MAY REPEAT IN 2 HOURS IF HEADACHE PERSISTS OR RECURS. Patient not taking: Reported on 12/21/2023 09/11/23   Towanda Fret, MD  Vitamin D , Ergocalciferol , (DRISDOL ) 1.25 MG (50000 UNIT) CAPS capsule Take 1 capsule (50,000 Units total) by mouth every 7 (seven) days. 11/17/23   Towanda Fret, MD      Allergies    Bee venom, Prochlorperazine edisylate, Cyclobenzaprine hcl, Aleve [naproxen sodium], Carbamazepine, Geodon [ziprasidone hcl], Ibuprofen, Maxzide [hydrochlorothiazide-triamterene ], Nsaids, Tylenol  [acetaminophen ], and Other    Review of Systems   Review of Systems  Gastrointestinal:  Positive for nausea and vomiting.  All other systems reviewed and are negative.   Physical Exam Updated Vital Signs BP (!) 171/100   Pulse 65   Temp 98.4 F (36.9 C) (Temporal)   Resp 18   Ht 5\' 9"  (1.753 m)   Wt 79.4 kg   SpO2 100%   BMI 25.84 kg/m  Physical Exam Vitals and nursing note  reviewed.  Constitutional:      General: He is not in acute distress.    Appearance: Normal appearance. He is normal weight. He is not ill-appearing, toxic-appearing or diaphoretic.  HENT:     Head: Normocephalic and atraumatic.  Cardiovascular:     Rate and Rhythm: Normal rate.  Pulmonary:     Effort: Pulmonary effort is normal. No respiratory distress.  Abdominal:     General: Abdomen is flat.     Palpations: Abdomen is soft.     Tenderness: There is no abdominal tenderness.  Musculoskeletal:        General: Normal range of motion.     Cervical back: Normal range  of motion.  Skin:    General: Skin is warm and dry.  Neurological:     General: No focal deficit present.     Mental Status: He is alert.  Psychiatric:        Mood and Affect: Mood normal.        Behavior: Behavior normal.     ED Results / Procedures / Treatments   Labs (all labs ordered are listed, but only abnormal results are displayed) Labs Reviewed  CBC WITH DIFFERENTIAL/PLATELET - Abnormal; Notable for the following components:      Result Value   RBC 3.58 (*)    Hemoglobin 11.8 (*)    HCT 34.8 (*)    RDW 11.0 (*)    Lymphs Abs 0.5 (*)    All other components within normal limits  COMPREHENSIVE METABOLIC PANEL WITH GFR - Abnormal; Notable for the following components:   Potassium 3.4 (*)    Glucose, Bld 138 (*)    BUN 32 (*)    Creatinine, Ser 2.58 (*)    GFR, Estimated 29 (*)    All other components within normal limits  TROPONIN I (HIGH SENSITIVITY)    EKG None  Radiology No results found.  Procedures Procedures    Medications Ordered in ED Medications  ondansetron  (ZOFRAN ) injection 4 mg (4 mg Intravenous Given 02/03/24 1419)  hydrALAZINE  (APRESOLINE ) injection 20 mg (20 mg Intravenous Given 02/03/24 1419)  sodium chloride  0.9 % bolus 1,000 mL (1,000 mLs Intravenous New Bag/Given 02/03/24 1418)    ED Course/ Medical Decision Making/ A&P                                 Medical Decision Making Amount and/or Complexity of Data Reviewed Labs: ordered.  Risk Prescription drug management.   This patient is a 53 y.o. male who presents to the ED for concern of nausea, vomiting, hypertension, this involves an extensive number of treatment options, and is a complaint that carries with it a high risk of complications and morbidity. The emergent differential diagnosis prior to evaluation includes, but is not limited to,  ACS/MI, Boerhaave's, DKA, elevated ICP, Ischemic bowel, Sepsis, drug-related, Appendicitis, Bowel obstruction, Electrolyte abnormalities,  Pancreatitis, Biliary colic, Gastroenteritis, Gastroparesis, Hepatitis, Migraine, Thyroid  disease, Renal colic, PUD   This is not an exhaustive differential.   Past Medical History / Co-morbidities / Social History:  has a past medical history of Acute kidney injury (HCC), Bilateral renal cysts (02/23/2014), Chronic back pain, Depression, major, single episode, severe (HCC) (08/13/2018), Duodenitis with bleeding (08/2008), Essential hypertension, benign, GERD (gastroesophageal reflux disease), Headache(784.0), Hyperlipidemia, Hypertensive urgency (10/08/2019), Malignant hypertension (10/09/2007), Migraines, Nonspecific abnormal electrocardiogram (ECG) (EKG) (08/30/2016), Obesity, unspecified, Sleep apnea, and Stress and adjustment reaction (11/04/2016).  Additional history:  Chart reviewed. Pertinent results include: seen for similar complaints on 01/26/23 when he was given zofran  and hydralazine  and discharged home after normal laboratory evaluation  Physical Exam: Physical exam performed. The pertinent findings include: abdomen soft and nontender  Lab Tests: I ordered, and personally interpreted labs.  The pertinent results include:  hgb 11.8 consistent with previous. K 3.4, BUN 32, creatinine 2.58, consistent with previous. Troponin WNL   Medications: I ordered medication including zofran , fluids, hydralazine   for dehydration, hypertension, nausea/vomiting. Reevaluation of the patient after these medicines showed that the patient resolved. I have reviewed the patients home medicines and have made adjustments as needed.   Disposition: After consideration of the diagnostic results and the patients response to treatment, I feel that emergency department workup does not suggest an emergent condition requiring admission or immediate intervention beyond what has been performed at this time. The plan is: Discharge with Zofran , close outpatient follow-up and return precautions.  After above interventions,  patient is feeling better and ready to go home. He is able to eat and drink without nausea or vomiting. His blood pressure has improved as well. Evaluation and diagnostic testing in the emergency department does not suggest an emergent condition requiring admission or immediate intervention beyond what has been performed at this time.  Plan for discharge with close PCP follow-up.  Patient is understanding and amenable with plan, educated on red flag symptoms that would prompt immediate return.  Patient discharged in stable condition.  Final Clinical Impression(s) / ED Diagnoses Final diagnoses:  Nausea and vomiting, unspecified vomiting type  Elevated blood pressure reading    Rx / DC Orders ED Discharge Orders          Ordered    ondansetron  (ZOFRAN -ODT) 4 MG disintegrating tablet  Every 8 hours PRN        02/03/24 1708          An After Visit Summary was printed and given to the patient.     Fredna Jasper 02/03/24 1710    Ninetta Basket, MD 02/06/24 1351

## 2024-02-03 NOTE — Discharge Instructions (Addendum)
 As we discussed, your workup in the ER today was reassuring for acute findings.  Laboratory evaluation did not reveal any emergent cause of your symptoms.  Your blood pressure was elevated, we gave you medicine and it came down adequately.  Please check your blood pressure routinely at home and document your readings to bring to your PCP at your next appointment to discuss whether additional medication management is indicated.  Additionally, I given you a prescription for Zofran  which is a nausea medication you can take as prescribed as needed for any residual symptoms.  Please be sure to maintain adequate oral hydration with fluids specifically those with electrolytes.  Return if development of any new or worsening symptoms.

## 2024-02-03 NOTE — ED Triage Notes (Signed)
 Pt to ER with c/o n/v that started this AM, pt also reports HTN and headache.

## 2024-02-05 ENCOUNTER — Encounter: Payer: Self-pay | Admitting: Family Medicine

## 2024-02-14 DIAGNOSIS — G4733 Obstructive sleep apnea (adult) (pediatric): Secondary | ICD-10-CM | POA: Diagnosis not present

## 2024-02-23 ENCOUNTER — Telehealth: Payer: Self-pay

## 2024-02-23 NOTE — Telephone Encounter (Signed)
 Copied from CRM [id number]. Topic: Clinical - Medication Prior Auth >> Feb 23, 2024  2:09 PM Sophia H wrote: Reason for CRM: Patients wife is calling in reference to the patients hydralazine  for his blood pressure. I do not see this medication in the chart, patients wife is stating that the medication needs a prior authorization. The patient is completely out, please advise. Best contact # [phone] Consuello Denise

## 2024-02-26 ENCOUNTER — Other Ambulatory Visit (HOSPITAL_COMMUNITY)
Admission: RE | Admit: 2024-02-26 | Discharge: 2024-02-26 | Disposition: A | Discharge status: Home / Self Care | Source: Ambulatory Visit | Attending: Nephrology | Admitting: Nephrology

## 2024-02-26 DIAGNOSIS — R809 Proteinuria, unspecified: Secondary | ICD-10-CM | POA: Insufficient documentation

## 2024-02-26 DIAGNOSIS — N189 Chronic kidney disease, unspecified: Secondary | ICD-10-CM | POA: Diagnosis not present

## 2024-02-26 DIAGNOSIS — D631 Anemia in chronic kidney disease: Secondary | ICD-10-CM | POA: Diagnosis not present

## 2024-02-26 DIAGNOSIS — E211 Secondary hyperparathyroidism, not elsewhere classified: Secondary | ICD-10-CM | POA: Diagnosis not present

## 2024-02-26 LAB — PROTEIN / CREATININE RATIO, URINE
Creatinine, Urine: 377 mg/dL
Protein Creatinine Ratio: 0.06 mg/mg{creat} (ref 0.00–0.15)
Total Protein, Urine: 21 mg/dL

## 2024-02-26 LAB — CBC
HCT: 32.6 % — ABNORMAL LOW (ref 39.0–52.0)
Hemoglobin: 10.6 g/dL — ABNORMAL LOW (ref 13.0–17.0)
MCH: 32.5 pg (ref 26.0–34.0)
MCHC: 32.5 g/dL (ref 30.0–36.0)
MCV: 100 fL (ref 80.0–100.0)
Platelets: 217 10*3/uL (ref 150–400)
RBC: 3.26 MIL/uL — ABNORMAL LOW (ref 4.22–5.81)
RDW: 11.6 % (ref 11.5–15.5)
WBC: 4.9 10*3/uL (ref 4.0–10.5)
nRBC: 0 % (ref 0.0–0.2)

## 2024-02-26 LAB — RENAL FUNCTION PANEL
Albumin: 4.2 g/dL (ref 3.5–5.0)
Anion gap: 8 (ref 5–15)
BUN: 29 mg/dL — ABNORMAL HIGH (ref 6–20)
CO2: 27 mmol/L (ref 22–32)
Calcium: 8.8 mg/dL — ABNORMAL LOW (ref 8.9–10.3)
Chloride: 104 mmol/L (ref 98–111)
Creatinine, Ser: 3.17 mg/dL — ABNORMAL HIGH (ref 0.61–1.24)
GFR, Estimated: 22 mL/min — ABNORMAL LOW (ref >60)
Glucose, Bld: 103 mg/dL — ABNORMAL HIGH (ref 70–99)
Phosphorus: 3.4 mg/dL (ref 2.5–4.6)
Potassium: 4.2 mmol/L (ref 3.5–5.1)
Sodium: 139 mmol/L (ref 135–145)

## 2024-02-28 LAB — PARATHYROID HORMONE, INTACT (NO CA): PTH: 93 pg/mL — ABNORMAL HIGH (ref 15–65)

## 2024-02-29 NOTE — Telephone Encounter (Signed)
 Has ov 06/13

## 2024-03-01 ENCOUNTER — Ambulatory Visit: Admitting: Family Medicine

## 2024-03-05 ENCOUNTER — Encounter: Payer: Self-pay | Admitting: Family Medicine

## 2024-03-13 DIAGNOSIS — H2513 Age-related nuclear cataract, bilateral: Secondary | ICD-10-CM | POA: Diagnosis not present

## 2024-03-13 DIAGNOSIS — H40003 Preglaucoma, unspecified, bilateral: Secondary | ICD-10-CM | POA: Diagnosis not present

## 2024-03-16 DIAGNOSIS — G4733 Obstructive sleep apnea (adult) (pediatric): Secondary | ICD-10-CM | POA: Diagnosis not present

## 2024-03-26 ENCOUNTER — Encounter: Payer: Self-pay | Admitting: Adult Health

## 2024-03-26 ENCOUNTER — Ambulatory Visit: Admitting: Adult Health

## 2024-04-10 NOTE — Telephone Encounter (Signed)
 SABRA

## 2024-04-15 DIAGNOSIS — G4733 Obstructive sleep apnea (adult) (pediatric): Secondary | ICD-10-CM | POA: Diagnosis not present

## 2024-08-01 ENCOUNTER — Other Ambulatory Visit: Payer: Self-pay | Admitting: Family Medicine

## 2024-09-02 ENCOUNTER — Ambulatory Visit: Payer: Self-pay

## 2024-09-02 NOTE — Telephone Encounter (Signed)
 FYI Only or Action Required?: FYI only for provider: urgent care advised.  Patient was last seen in primary care on 11/08/2023 by Antonetta Rollene BRAVO, MD.  Called Nurse Triage reporting Leg Swelling and Elbow Pain.  Symptoms began several days ago.  Interventions attempted: Rest, hydration, or home remedies.  Symptoms are: gradually worsening.  Triage Disposition: See HCP Within 4 Hours (Or PCP Triage)  Patient/caregiver understands and will follow disposition?: Yes    Copied from CRM #[id number]. Topic: Clinical - Red Word Triage >> Sep 02, 2024 11:08 AM Gustabo D wrote: Pt legs are swollen and problem with elbow joint hurting. His legs started hurting about two days now and elbow about 2 weeks. Reason for Disposition  [1] Thigh or calf pain AND [2] only 1 side AND [3] present > 1 hour  Answer Assessment - Initial Assessment Questions 1. ONSET: When did the swelling start? (e.g., minutes, hours, days)     Two days 2. LOCATION: What part of the leg is swollen?  Are both legs swollen or just one leg?     Ankle to calf 3. SEVERITY: How bad is the swelling? (e.g., localized; mild, moderate, severe)     Right greater than left 4. REDNESS: Is there redness or signs of infection?      5. PAIN: Is the swelling painful to touch? If Yes, ask: How painful is it?   (Scale 1-10; mild, moderate or severe)     Limping  6. FEVER: Do you have a fever? If Yes, ask: What is it, how was it measured, and when did it start?      Denies  7. CAUSE: What do you think is causing the leg swelling?     unsure 8. MEDICAL HISTORY: Do you have a history of blood clots (e.g., DVT), cancer, heart failure, kidney disease, or liver failure?      9. RECURRENT SYMPTOM: Have you had leg swelling before? If Yes, ask: When was the last time? What happened that time?     yes 10. OTHER SYMPTOMS: Do you have any other symptoms? (e.g., chest pain, difficulty breathing)       Elbow joint  pain 11. PREGNANCY: Is there any chance you are pregnant? When was your last menstrual period?  Protocols used: Leg Swelling and Edema-A-AH

## 2024-09-02 NOTE — Telephone Encounter (Signed)
 Noted, uc advised

## 2024-09-08 ENCOUNTER — Other Ambulatory Visit: Payer: Self-pay | Admitting: Family Medicine

## 2024-10-22 ENCOUNTER — Ambulatory Visit: Payer: Self-pay

## 2024-10-22 ENCOUNTER — Telehealth: Payer: Self-pay | Admitting: Family Medicine

## 2024-10-22 NOTE — Telephone Encounter (Signed)
 Noted.

## 2024-10-22 NOTE — Telephone Encounter (Signed)
 FYI Only or Action Required?: FYI only for provider: appointment scheduled on 10/23/24.  Patient was last seen in primary care on 11/08/2023 by Antonetta Rollene BRAVO, MD.  Called Nurse Triage reporting Arm Pain.  Symptoms began 2 weeks ago.  Interventions attempted: Nothing.  Symptoms are: stable.  Triage Disposition: See PCP When Office is Open (Within 3 Days)  Patient/caregiver understands and will follow disposition?: Yes   Reason for Disposition  [1] MODERATE pain (e.g., interferes with normal activities) AND [2] present > 3 days  Answer Assessment - Initial Assessment Questions Pt's wife Doneta calling in with patient today. Has not taken anything OTC. Pt already has an appointment scheduled for 10/23/24 for leg swelling, patient reports the swelling has gone down, and wearing compression stockings. Advised patient  to keep appointment for tomorrow to address the right arm pain and the leg swelling. Patient agreeable.   1. ONSET: When did the pain start?     2 weeks ago  2. LOCATION: Where is the pain located?     Right upper arm to the elbow  3. PAIN: How bad is the pain? (Scale 0-10; or none, mild, moderate, severe)     8/10 to move in certain positions.   4. WORK OR EXERCISE: Has there been any recent work or exercise that involved this part of the body?     Denies  5. CAUSE: What do you think is causing the arm pain?     Unsure  6. OTHER SYMPTOMS: Do you have any other symptoms? (e.g., neck pain, swelling, rash, fever, numbness, weakness)     Denies  Protocols used: Arm Pain-A-AH  Message from Mia F sent at 10/22/2024  1:40 PM EST  Reason for Triage: Right arm pain that has been going on for 2 weeks and is worsening. No swelling, numbness, or tingling.

## 2024-10-23 ENCOUNTER — Encounter: Payer: Self-pay | Admitting: Internal Medicine

## 2024-10-23 ENCOUNTER — Ambulatory Visit: Payer: Self-pay | Admitting: Internal Medicine

## 2024-10-23 VITALS — BP 138/86 | HR 86 | Ht 72.0 in | Wt 175.0 lb

## 2024-10-23 DIAGNOSIS — M25521 Pain in right elbow: Secondary | ICD-10-CM | POA: Diagnosis not present

## 2024-10-23 DIAGNOSIS — Z125 Encounter for screening for malignant neoplasm of prostate: Secondary | ICD-10-CM | POA: Diagnosis not present

## 2024-10-23 DIAGNOSIS — Z0001 Encounter for general adult medical examination with abnormal findings: Secondary | ICD-10-CM | POA: Insufficient documentation

## 2024-10-23 DIAGNOSIS — N184 Chronic kidney disease, stage 4 (severe): Secondary | ICD-10-CM

## 2024-10-23 DIAGNOSIS — Z1211 Encounter for screening for malignant neoplasm of colon: Secondary | ICD-10-CM | POA: Insufficient documentation

## 2024-10-23 DIAGNOSIS — R739 Hyperglycemia, unspecified: Secondary | ICD-10-CM

## 2024-10-23 DIAGNOSIS — E782 Mixed hyperlipidemia: Secondary | ICD-10-CM

## 2024-10-23 DIAGNOSIS — I1A0 Resistant hypertension: Secondary | ICD-10-CM | POA: Diagnosis not present

## 2024-10-23 DIAGNOSIS — E559 Vitamin D deficiency, unspecified: Secondary | ICD-10-CM | POA: Diagnosis not present

## 2024-10-23 MED ORDER — PREDNISONE 20 MG PO TABS: 40.0000 mg | ORAL_TABLET | Freq: Every day | ORAL | 0 refills | Status: AC

## 2024-10-23 MED ORDER — CARVEDILOL 3.125 MG PO TABS: 3.1250 mg | ORAL_TABLET | Freq: Two times a day (BID) | ORAL | 3 refills | Status: AC

## 2024-10-23 NOTE — Patient Instructions (Signed)
 Please start taking Prednisone  as prescribed. Please follow up with Orthopedic specialist for elbow pain.  Please continue to take medications as prescribed.  Please continue to follow low salt diet and perform moderate exercise/walking at least 150 mins/week.

## 2024-10-23 NOTE — Assessment & Plan Note (Signed)
 Could be bursitis versus epicondylitis Referred to orthopedic surgery Unable to take oral NSAIDs due to CKD Oral prednisone  40 mg QD x 5 days

## 2024-10-23 NOTE — Assessment & Plan Note (Signed)
 Progressive CKD Check CMP Avoid nephrotoxic agents Advised to keep close follow up with Nephrology

## 2024-10-23 NOTE — Assessment & Plan Note (Signed)
 BP Readings from Last 1 Encounters:  10/23/24 138/86   Well-controlled, overall stable at home with Amlodipine  and Clonidine  patch Advised to continue carvedilol  3.125 mg twice daily considering history of cardiomyopathy, refilled Counseled for compliance with the medications Advised DASH diet and moderate exercise/walking, at least 150 mins/week

## 2024-10-23 NOTE — Assessment & Plan Note (Signed)
 Discussed about colonoscopy and cologuard - benefits of each procedure discussed. Patient prefers Cologuard - ordered.

## 2024-10-23 NOTE — Assessment & Plan Note (Signed)
 Physical exam as documented. Fasting blood tests ordered today. Denies pneumococcal and Shingrix vaccines.

## 2024-10-23 NOTE — Progress Notes (Signed)
 "  Established Patient Office Visit  Subjective:  Patient ID: [name], male    DOB: 08/29/1971  Age: 54 y.o. MRN: [id number]  CC:  Chief Complaint  Patient presents with   Arm Pain    Right arm pain for three weeks   Annual Exam    HPI [name] is a 54 y.o. male with past medical history of HTN, migraine, CKD stage 4 and insomnia who presents for annual physical.  His BP was wnl today, has been stable overall at home. He is on Amlodipine  and Clonidine  patch. He has run out of Carvedilol . Patient denies headache, dizziness, chest pain, dyspnea or palpitations.  CKD: His BMP have shown progressive CKD stage 4. Denies any urinary complaints currently. He needs to schedule follow up apt with Nephrology.  Right elbow pain: He complains of right elbow pain for the last 1 month.  His pain is constant, sharp, radiating to right forearm area and is worse with activity.  He plays piano and drum, and has difficulty playing his instruments due to elbow pain.  Denies any injury.  Does not report any local swelling or redness.   Past Medical History:  Diagnosis Date   Acute kidney injury    Bilateral renal cysts 02/23/2014   Noted in 2013 initially    Chronic back pain    Depression, major, single episode, severe (HCC) 08/13/2018   pHQ 9 score of 24, not suicidal or homicidal, score of 8 in 09/2018, no medication or therapy as of this time   Duodenitis with bleeding 08/2008   Ulcer - admitted to [facility]    Essential hypertension, benign    GERD (gastroesophageal reflux disease)    Headache(784.0)    Hyperlipidemia    Hypertensive urgency 10/08/2019   Malignant hypertension 10/09/2007        Migraines    Nonspecific abnormal electrocardiogram (ECG) (EKG) 08/30/2016   Obesity, unspecified    Sleep apnea    Stress and adjustment reaction 11/04/2016    Past Surgical History:  Procedure Laterality Date   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   Procedure: Revision and pinning of  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.;  Surgeon: Alm DELENA Hummer, MD;  Location: [facility] OR;  Service: Orthopedics;  Laterality: Left;   CHOLECYSTECTOMY N/A 10/03/2018   Procedure: LAPAROSCOPIC CHOLECYSTECTOMY;  Surgeon: Mavis Anes, MD;  Location: [facility] ORS;  Service: General;  Laterality: N/A;   FINGER DEBRIDEMENT Left 09/25/2013   Thumb   FOOT FRACTURE SURGERY Right     Family History  Problem Relation Age of Onset   Diabetes Mother    Hypertension Mother    Hypertension Father    Arthritis Father    Diabetes Father    Colon cancer Neg Hx    Gastric cancer Neg Hx    Esophageal cancer Neg Hx     Social History   Socioeconomic History   Marital status: Married    Spouse name: Not on file   Number of children: 4   Years of education: Not on file   Highest education level: Not on file  Occupational History   Occupation: Musician     Comment: [employer]  Tobacco Use   Smoking status: Former    Current packs/day: 0.00    Average packs/day: 0.1 packs/day for 15.0 years (1.5 ttl pk-yrs)    Types: Cigarettes    Start date: 09/15/1997    Quit date: 09/15/2012    Years since quitting: 12.1  Smokeless tobacco: Never  Vaping Use   Vaping status: Never Used  Substance and Sexual Activity   Alcohol use: No   Drug use: No   Sexual activity: Yes  Other Topics Concern   Not on file  Social History Narrative   Lives with his family, work part time at [employer].    Social Drivers of Health   Tobacco Use: Medium Risk (10/23/2024)   Patient History    Smoking Tobacco Use: Former    Smokeless Tobacco Use: Never    Passive Exposure: Not on Actuary Strain: Not on file  Food Insecurity: Not on file  Transportation Needs: Not on file  Physical Activity: Not on file  Stress: Not on file  Social Connections: Not on file  Intimate Partner Violence: Not on file  Depression (PHQ2-9): Low Risk (10/23/2024)   Depression (PHQ2-9)     PHQ-2 Score: 1  Alcohol Screen: Not on file  Housing: Not on file  Utilities: Not on file  Health Literacy: Not on file    Outpatient Medications Prior to Visit  Medication Sig Dispense Refill   amLODipine  (NORVASC ) 10 MG tablet TAKE 1 TABLET BY MOUTH IN THE MORNING AND TAKE 1 TABLET BY MOUTH AT 8 PM FOR BLOOD PRESSURE 180 tablet 1   Cholecalciferol (VITAMIN D3) 25 MCG (1000 UT) CAPS Take 1 capsule (1,000 Units total) by mouth daily. 60 capsule 3   cloNIDine  (CATAPRES  - DOSED IN MG/24 HR) 0.3 mg/24hr patch APPLY 1 PATCH ON THE SKIN ONCE A WEEK 12 patch 1   esomeprazole  (NEXIUM ) 20 MG capsule Take 1 capsule (20 mg total) by mouth 2 (two) times daily before a meal. (Patient taking differently: Take 20 mg by mouth daily before breakfast.) 60 capsule 3   hydrOXYzine  (VISTARIL ) 50 MG capsule TAKE 1 CAPSULE BY MOUTH 3 TIMES DAILY AS NEEDED. 270 capsule 1   isosorbide  dinitrate (ISORDIL ) 20 MG tablet Take 1 tablet (20 mg total) by mouth 2 (two) times daily. 90 tablet 3   ondansetron  (ZOFRAN -ODT) 4 MG disintegrating tablet Take 1 tablet (4 mg total) by mouth every 8 (eight) hours as needed. 20 tablet 0   SUMAtriptan  (IMITREX ) 50 MG tablet TAKE 1 TABLET BY MOUTH EVERY 2 HOURS AS NEEDED FOR MIGRAINE. MAY REPEAT IN 2 HOURS IF HEADACHE PERSISTS OR RECURS. (Patient not taking: Reported on 12/21/2023) 10 tablet 0   Vitamin D , Ergocalciferol , (DRISDOL ) 1.25 MG (50000 UNIT) CAPS capsule Take 1 capsule (50,000 Units total) by mouth every 7 (seven) days. 5 capsule 6   carvedilol  (COREG ) 3.125 MG tablet Take 1 tablet (3.125 mg total) by mouth 2 (two) times daily with a meal. 60 tablet 3   No facility-administered medications prior to visit.    Allergies[1]  ROS Review of Systems  Constitutional:  Negative for chills and fever.  HENT:  Negative for congestion and sore throat.   Eyes:  Negative for pain and discharge.  Respiratory:  Negative for cough and shortness of breath.   Cardiovascular:  Negative for  chest pain and palpitations.  Gastrointestinal:  Negative for diarrhea, nausea and vomiting.  Endocrine: Negative for polydipsia and polyuria.  Genitourinary:  Negative for dysuria and hematuria.  Musculoskeletal:  Positive for arthralgias (R elbow). Negative for neck pain and neck stiffness.  Skin:  Negative for rash.  Neurological:  Negative for dizziness, weakness, numbness and headaches.  Psychiatric/Behavioral:  Negative for agitation and behavioral problems.       Objective:  Physical Exam Vitals reviewed.  Constitutional:      General: He is not in acute distress.    Appearance: He is not diaphoretic.  HENT:     Head: Normocephalic and atraumatic.     Nose: Nose normal.     Mouth/Throat:     Mouth: Mucous membranes are moist.  Eyes:     General: No scleral icterus.    Extraocular Movements: Extraocular movements intact.  Cardiovascular:     Rate and Rhythm: Normal rate and regular rhythm.     Heart sounds: Normal heart sounds. No murmur heard. Pulmonary:     Breath sounds: Normal breath sounds. No wheezing or rales.  Abdominal:     Palpations: Abdomen is soft.     Tenderness: There is no abdominal tenderness.  Musculoskeletal:     Right elbow: No swelling. Normal range of motion. Tenderness present in lateral epicondyle.     Cervical back: Neck supple. No tenderness.     Right lower leg: No edema.     Left lower leg: No edema.  Skin:    General: Skin is warm.     Findings: No rash.  Neurological:     General: No focal deficit present.     Mental Status: He is alert and oriented to person, place, and time.     Cranial Nerves: No cranial nerve deficit.     Sensory: No sensory deficit.  Psychiatric:        Mood and Affect: Mood normal.        Behavior: Behavior normal.     BP 138/86   Pulse 86   Ht 6' (1.829 m)   Wt 175 lb (79.4 kg)   SpO2 98%   BMI 23.73 kg/m  Wt Readings from Last 3 Encounters:  10/23/24 175 lb (79.4 kg)  02/03/24 175 lb (79.4 kg)   12/21/23 181 lb 4.8 oz (82.2 kg)    Lab Results  Component Value Date   TSH 0.672 11/10/2023   Lab Results  Component Value Date   WBC 4.9 02/26/2024   HGB 10.6 (L) 02/26/2024   HCT 32.6 (L) 02/26/2024   MCV 100.0 02/26/2024   PLT 217 02/26/2024   Lab Results  Component Value Date   NA 139 02/26/2024   K 4.2 02/26/2024   CO2 27 02/26/2024   GLUCOSE 103 (H) 02/26/2024   BUN 29 (H) 02/26/2024   CREATININE 3.17 (H) 02/26/2024   BILITOT 0.9 02/03/2024   ALKPHOS 77 02/03/2024   AST 18 02/03/2024   ALT 11 02/03/2024   PROT 7.5 02/03/2024   ALBUMIN 4.2 02/26/2024   CALCIUM  8.8 (L) 02/26/2024   ANIONGAP 8 02/26/2024   EGFR 29 (L) 11/10/2023   Lab Results  Component Value Date   CHOL 112 11/10/2023   Lab Results  Component Value Date   HDL 46 11/10/2023   Lab Results  Component Value Date   LDLCALC 53 11/10/2023   Lab Results  Component Value Date   TRIG 59 11/10/2023   Lab Results  Component Value Date   CHOLHDL 2.4 11/10/2023   Lab Results  Component Value Date   HGBA1C 4.7 (L) 11/12/2021      Assessment & Plan:   Problem List Items Addressed This Visit       Cardiovascular and Mediastinum   Resistant hypertension (Chronic)   BP Readings from Last 1 Encounters:  10/23/24 138/86   Well-controlled, overall stable at home with Amlodipine  and Clonidine  patch Advised to continue carvedilol   3.125 mg twice daily considering history of cardiomyopathy, refilled Counseled for compliance with the medications Advised DASH diet and moderate exercise/walking, at least 150 mins/week       Relevant Medications   carvedilol  (COREG ) 3.125 MG tablet   Other Relevant Orders   TSH   CMP14+EGFR   CBC with Differential/Platelet     Genitourinary   CKD (chronic kidney disease) stage 4, GFR 15-29 ml/min (HCC) (Chronic)   Progressive CKD Check CMP Avoid nephrotoxic agents Advised to keep close follow up with Nephrology      Relevant Orders   CMP14+EGFR    CBC with Differential/Platelet     Other   Encounter for general adult medical examination with abnormal findings - Primary   Physical exam as documented. Fasting blood tests ordered today. Denies pneumococcal and Shingrix vaccines.      Right elbow pain   Could be bursitis versus epicondylitis Referred to orthopedic surgery Unable to take oral NSAIDs due to CKD Oral prednisone  40 mg QD x 5 days      Relevant Medications   predniSONE  (DELTASONE ) 20 MG tablet   Other Relevant Orders   Ambulatory referral to Orthopedic Surgery   Colon cancer screening   Discussed about colonoscopy and cologuard - benefits of each procedure discussed. Patient prefers Cologuard - ordered.      Relevant Orders   Cologuard   Other Visit Diagnoses       Vitamin D  deficiency       Relevant Orders   VITAMIN D  25 Hydroxy (Vit-D Deficiency, Fractures)     Screening for prostate cancer       Relevant Orders   PSA     Mixed hyperlipidemia       Relevant Medications   carvedilol  (COREG ) 3.125 MG tablet   Other Relevant Orders   Lipid panel     Hyperglycemia       Relevant Orders   Hemoglobin A1c   CMP14+EGFR       Meds ordered this encounter  Medications   predniSONE  (DELTASONE ) 20 MG tablet    Sig: Take 2 tablets (40 mg total) by mouth daily with breakfast.    Dispense:  10 tablet    Refill:  0   carvedilol  (COREG ) 3.125 MG tablet    Sig: Take 1 tablet (3.125 mg total) by mouth 2 (two) times daily with a meal.    Dispense:  180 tablet    Refill:  3    Follow-up: Return in about 6 months (around 04/22/2025).    Suzzane MARLA Blanch, MD     [1]  Allergies Allergen Reactions   Bee Venom Anaphylaxis   Prochlorperazine Edisylate Other (See Comments)    Reaction: Jittery,uncomfortable feeling   Cyclobenzaprine Hcl Other (See Comments)    REACTION: feel anxious, nausea   Aleve [Naproxen Sodium] Nausea And Vomiting    States that this medication also causes abdominal pain    Carbamazepine     REACTION: unknown reaction   Geodon [Ziprasidone Hcl] Nausea And Vomiting   Ibuprofen Nausea And Vomiting    States that this medication also causes abdominal pain   Maxzide [Hydrochlorothiazide-Triamterene ]     headache   Nsaids Other (See Comments)    H/o severe anemia from UGIB from ulcer   Tylenol  [Acetaminophen ] Nausea And Vomiting    Patient states that this medication also causes abdominal pain   Other Nausea And Vomiting, Rash and Other (See Comments)    ALL MUSCLE RELAXANTS: states that they affect  sciatic nerve, may cause nausea and vomiting, rash   "

## 2025-04-23 ENCOUNTER — Ambulatory Visit: Payer: Self-pay | Admitting: Family Medicine
